# Patient Record
Sex: Male | Born: 1940 | Race: White | Hispanic: No | Marital: Married | Smoking: Never smoker
Health system: Southern US, Community
[De-identification: ages and names within clinical notes are randomized; demographics above are authoritative.]

## PROBLEM LIST (undated history)

## (undated) DIAGNOSIS — H269 Unspecified cataract: Secondary | ICD-10-CM

## (undated) DIAGNOSIS — C61 Malignant neoplasm of prostate: Secondary | ICD-10-CM

## (undated) DIAGNOSIS — C801 Malignant (primary) neoplasm, unspecified: Secondary | ICD-10-CM

## (undated) DIAGNOSIS — I10 Essential (primary) hypertension: Secondary | ICD-10-CM

## (undated) HISTORY — DX: Unspecified cataract: H26.9

## (undated) HISTORY — PX: LIVER BIOPSY: SHX301

## (undated) HISTORY — PX: OTHER SURGICAL HISTORY: SHX169

## (undated) HISTORY — PX: PROSTATE BIOPSY: SHX241

## (undated) HISTORY — DX: Essential (primary) hypertension: I10

---
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
## 2016-09-23 HISTORY — PX: PROSTATECTOMY: SHX69

## 2018-01-28 DIAGNOSIS — H269 Unspecified cataract: Secondary | ICD-10-CM | POA: Insufficient documentation

## 2018-01-28 DIAGNOSIS — Z7189 Other specified counseling: Secondary | ICD-10-CM | POA: Insufficient documentation

## 2018-01-28 DIAGNOSIS — C61 Malignant neoplasm of prostate: Secondary | ICD-10-CM | POA: Insufficient documentation

## 2018-01-28 DIAGNOSIS — Z1329 Encounter for screening for other suspected endocrine disorder: Secondary | ICD-10-CM | POA: Insufficient documentation

## 2018-01-28 DIAGNOSIS — Z1211 Encounter for screening for malignant neoplasm of colon: Secondary | ICD-10-CM | POA: Insufficient documentation

## 2018-01-28 DIAGNOSIS — Z0000 Encounter for general adult medical examination without abnormal findings: Secondary | ICD-10-CM | POA: Insufficient documentation

## 2018-01-28 DIAGNOSIS — E782 Mixed hyperlipidemia: Secondary | ICD-10-CM | POA: Insufficient documentation

## 2018-01-28 DIAGNOSIS — I341 Nonrheumatic mitral (valve) prolapse: Secondary | ICD-10-CM | POA: Insufficient documentation

## 2018-01-28 DIAGNOSIS — I10 Essential (primary) hypertension: Secondary | ICD-10-CM | POA: Insufficient documentation

## 2018-01-28 DIAGNOSIS — Z130 Encounter for screening for diseases of the blood and blood-forming organs and certain disorders involving the immune mechanism: Secondary | ICD-10-CM | POA: Insufficient documentation

## 2018-01-28 DIAGNOSIS — Z125 Encounter for screening for malignant neoplasm of prostate: Secondary | ICD-10-CM | POA: Insufficient documentation

## 2018-03-04 DIAGNOSIS — R739 Hyperglycemia, unspecified: Secondary | ICD-10-CM | POA: Insufficient documentation

## 2018-03-04 DIAGNOSIS — N183 Chronic kidney disease, stage 3 unspecified: Secondary | ICD-10-CM | POA: Insufficient documentation

## 2018-03-04 DIAGNOSIS — R0989 Other specified symptoms and signs involving the circulatory and respiratory systems: Secondary | ICD-10-CM | POA: Insufficient documentation

## 2019-12-16 ENCOUNTER — Encounter: Payer: Self-pay | Admitting: Family Medicine

## 2019-12-16 ENCOUNTER — Ambulatory Visit (INDEPENDENT_AMBULATORY_CARE_PROVIDER_SITE_OTHER): Payer: Medicare Other | Admitting: Family Medicine

## 2019-12-16 ENCOUNTER — Other Ambulatory Visit: Payer: Self-pay

## 2019-12-16 VITALS — BP 130/82 | HR 82 | Temp 98.2°F | Ht 68.5 in | Wt 193.2 lb

## 2019-12-16 DIAGNOSIS — C61 Malignant neoplasm of prostate: Secondary | ICD-10-CM | POA: Insufficient documentation

## 2019-12-16 DIAGNOSIS — Z1211 Encounter for screening for malignant neoplasm of colon: Secondary | ICD-10-CM

## 2019-12-16 DIAGNOSIS — I10 Essential (primary) hypertension: Secondary | ICD-10-CM

## 2019-12-16 DIAGNOSIS — H269 Unspecified cataract: Secondary | ICD-10-CM | POA: Diagnosis not present

## 2019-12-16 NOTE — Assessment & Plan Note (Signed)
At goal.  Continue amlodipine 5 mg daily. 

## 2019-12-16 NOTE — Assessment & Plan Note (Signed)
Stable.  Will place referral to ophthalmology.

## 2019-12-16 NOTE — Patient Instructions (Signed)
It was very nice to see you today!  I will place a referral for you to see the eye doctor and urologist.  We will place an order for you to get colon cancer screening.  Please let me know when you need to refill your amlodipine.  Come back in 1 year for your next checkup with blood work, or sooner if needed.  Take care, Dr Jerline Pain  Please try these tips to maintain a healthy lifestyle:   Eat at least 3 REAL meals and 1-2 snacks per day.  Aim for no more than 5 hours between eating.  If you eat breakfast, please do so within one hour of getting up.    Each meal should contain half fruits/vegetables, one quarter protein, and one quarter carbs (no bigger than a computer mouse)   Cut down on sweet beverages. This includes juice, soda, and sweet tea.     Drink at least 1 glass of water with each meal and aim for at least 8 glasses per day   Exercise at least 150 minutes every week.

## 2019-12-16 NOTE — Assessment & Plan Note (Signed)
Stable.  Will place referral to urology.

## 2019-12-16 NOTE — Progress Notes (Signed)
Neil Perry is a 79 y.o. male who presents today for an office visit.  Assessment/Plan:  Chronic Problems Addressed Today: Essential hypertension At goal.  Continue amlodipine 5 mg daily.  Cataract of both eyes Stable.  Will place referral to ophthalmology.  Prostate cancer ([place]) Stable.  Will place referral to urology.  Preventative Healthcare Will place order for cologuard.     Subjective:  HPI:  His stable, chronic medical conditions are outlined below:   # Essential Hypertension - On amlodipine 5mg  daily and tolerating well  # Prostate Cancer s/p prostatectomy 2018 - Follows with urology  # Cataracts - Follows with ophthamology  PMH:  The following were reviewed and entered/updated in epic: Past Medical History:  Diagnosis Date  . Cancer ([place])   . Cataract   . Hypertension    Patient Active Problem List   Diagnosis Date Noted  . Prostate cancer ([place]) 12/16/2019  . Cataract of both eyes 12/16/2019  . Essential hypertension 12/16/2019   Past Surgical History:  Procedure Laterality Date  . cataract surgery Right   . PROSTATECTOMY  2018  . PROSTATECTOMY      Family History  Problem Relation Age of Onset  . Prostate cancer Father   . Prostate cancer Brother     Medications- reviewed and updated Current Outpatient Medications  Medication Sig Dispense Refill  . amLODipine (NORVASC) 5 MG tablet Take 5 mg by mouth daily.     No current facility-administered medications for this visit.    Allergies-reviewed and updated No Known Allergies  Social History   Socioeconomic History  . Marital status: Married    Spouse name: Not on file  . Number of children: Not on file  . Years of education: Not on file  . Highest education level: Not on file  Occupational History  . Not on file  Tobacco Use  . Smoking status: Never Smoker  . Smokeless tobacco: Never Used  Substance and Sexual Activity  . Alcohol use: Yes    Alcohol/week: 2.0 standard  drinks    Types: 2 Glasses of wine per week  . Drug use: Never  . Sexual activity: Not on file  Other Topics Concern  . Not on file  Social History Narrative  . Not on file   Social Determinants of Health   Financial Resource Strain:   . Difficulty of Paying Living Expenses:   Food Insecurity:   . Worried About Charity fundraiser in the Last Year:   . Arboriculturist in the Last Year:   Transportation Needs:   . Film/video editor (Medical):   Marland Kitchen Lack of Transportation (Non-Medical):   Physical Activity:   . Days of Exercise per Week:   . Minutes of Exercise per Session:   Stress:   . Feeling of Stress :   Social Connections:   . Frequency of Communication with Friends and Family:   . Frequency of Social Gatherings with Friends and Family:   . Attends Religious Services:   . Active Member of Clubs or Organizations:   . Attends Archivist Meetings:   Marland Kitchen Marital Status:          Objective:  Physical Exam: BP 130/82   Pulse 82   Temp 98.2 F (36.8 C)   Ht 5' 8.5" (1.74 m)   Wt 193 lb 4 oz (87.7 kg)   SpO2 99%   BMI 28.96 kg/m   Gen: No acute distress, resting comfortably CV: Regular rate  and rhythm with no murmurs appreciated Pulm: Normal work of breathing, clear to auscultation bilaterally with no crackles, wheezes, or rhonchi Neuro: Grossly normal, moves all extremities Psych: Normal affect and thought content      Early Ord M. Jerline Pain, MD 12/16/2019 2:16 PM

## 2019-12-17 ENCOUNTER — Ambulatory Visit: Payer: Medicare Other | Attending: Internal Medicine

## 2019-12-17 DIAGNOSIS — Z23 Encounter for immunization: Secondary | ICD-10-CM

## 2019-12-17 NOTE — Progress Notes (Signed)
   Covid-19 Vaccination Clinic  Name:  Neil Perry    MRN: [id number] DOB: 25-Nov-1940  12/17/2019  [name] was observed post Covid-19 immunization for 15 minutes without incident. He was provided with Vaccine Information Sheet and instruction to access the V-Safe system.   [name] was instructed to call 911 with any severe reactions post vaccine: Marland Kitchen Difficulty breathing  . Swelling of face and throat  . A fast heartbeat  . A bad rash all over body  . Dizziness and weakness   Immunizations Administered    Name Date Dose VIS Date Route   Pfizer COVID-19 Vaccine 12/17/2019  1:04 PM 0.3 mL 09/03/2019 Intramuscular   Manufacturer: [place]   Lot: [id number]   [place]: [id number]

## 2020-01-11 ENCOUNTER — Ambulatory Visit: Payer: Medicare Other | Attending: Internal Medicine

## 2020-01-11 DIAGNOSIS — Z23 Encounter for immunization: Secondary | ICD-10-CM

## 2020-01-11 NOTE — Progress Notes (Signed)
   Covid-19 Vaccination Clinic  Name:  Neil Perry    MRN: [id number] DOB: May 07, 1941  01/11/2020  [name] was observed post Covid-19 immunization for 15 minutes without incident. He was provided with Vaccine Information Sheet and instruction to access the V-Safe system.   [name] was instructed to call 911 with any severe reactions post vaccine: Marland Kitchen Difficulty breathing  . Swelling of face and throat  . A fast heartbeat  . A bad rash all over body  . Dizziness and weakness   Immunizations Administered    Name Date Dose VIS Date Route   Pfizer COVID-19 Vaccine 01/11/2020  1:04 PM 0.3 mL 11/17/2018 Intramuscular   Manufacturer: [place]   Lot: [id number]   [place]: [id number]

## 2020-01-18 ENCOUNTER — Encounter (INDEPENDENT_AMBULATORY_CARE_PROVIDER_SITE_OTHER): Payer: Self-pay

## 2020-01-18 ENCOUNTER — Ambulatory Visit (INDEPENDENT_AMBULATORY_CARE_PROVIDER_SITE_OTHER): Payer: Medicare Other

## 2020-01-18 ENCOUNTER — Other Ambulatory Visit: Payer: Self-pay

## 2020-01-18 DIAGNOSIS — Z0000 Encounter for general adult medical examination without abnormal findings: Secondary | ICD-10-CM | POA: Diagnosis not present

## 2020-01-18 NOTE — Progress Notes (Signed)
This visit is being conducted via phone call due to the COVID-19 pandemic. This patient has given me verbal consent via phone to conduct this visit, patient states they are participating from their home address. Some vital signs may be absent or patient reported.   Patient identification: identified by name, DOB, and current address.  Location provider: [facility], Office Persons participating in the virtual visit: Neil Perry, patient, and Dr. Dimas Chyle     Subjective:   Neil Perry is a 79 y.o. male who presents for an Initial Medicare Annual Wellness Visit.  Review of Systems   Cardiac Risk Factors include: advanced age (>82men, >27 women);male gender;hypertension   Objective:    There were no vitals filed for this visit. There is no height or weight on file to calculate BMI.  Advanced Directives 01/18/2020  Does Patient Have a Medical Advance Directive? Yes  Type of Advance Directive Living will;Healthcare Power of Attorney  Does patient want to make changes to medical advance directive? No - Patient declined  Copy of [place] in Chart? No - copy requested    Current Medications (verified) Outpatient Encounter Medications as of 01/18/2020  Medication Sig  . amLODipine (NORVASC) 5 MG tablet Take 5 mg by mouth daily.   No facility-administered encounter medications on file as of 01/18/2020.    Allergies (verified) Patient has no known allergies.   History: Past Medical History:  Diagnosis Date  . Cancer ([place])   . Cataract   . Hypertension    Past Surgical History:  Procedure Laterality Date  . cataract surgery Right   . PROSTATECTOMY  2018  . PROSTATECTOMY     Family History  Problem Relation Age of Onset  . Prostate cancer Father   . Prostate cancer Brother    Social History   Socioeconomic History  . Marital status: Married    Spouse name: Not on file  . Number of children: Not on file  . Years of education: Not on  file  . Highest education level: Not on file  Occupational History  . Not on file  Tobacco Use  . Smoking status: Never Smoker  . Smokeless tobacco: Never Used  Substance and Sexual Activity  . Alcohol use: Yes    Alcohol/week: 2.0 standard drinks    Types: 2 Glasses of wine per week  . Drug use: Never  . Sexual activity: Not on file  Other Topics Concern  . Not on file  Social History Narrative  . Not on file   Social Determinants of Health   Financial Resource Strain:   . Difficulty of Paying Living Expenses:   Food Insecurity:   . Worried About Charity fundraiser in the Last Year:   . Arboriculturist in the Last Year:   Transportation Needs:   . Film/video editor (Medical):   Marland Kitchen Lack of Transportation (Non-Medical):   Physical Activity:   . Days of Exercise per Week:   . Minutes of Exercise per Session:   Stress:   . Feeling of Stress :   Social Connections:   . Frequency of Communication with Friends and Family:   . Frequency of Social Gatherings with Friends and Family:   . Attends Religious Services:   . Active Member of Clubs or Organizations:   . Attends Archivist Meetings:   Marland Kitchen Marital Status:    Tobacco Counseling Counseling given: Not Answered   Clinical Intake:  Pre-visit preparation completed:  Yes  Pain : No/denies pain  Diabetes: No  How often do you need to have someone help you when you read instructions, pamphlets, or other written materials from your doctor or pharmacy?: 1 - Never  Interpreter Needed?: No  Information entered by :: Neil Perry  Activities of Daily Living In your present state of health, do you have any difficulty performing the following activities: 01/18/2020  Hearing? N  Vision? N  Difficulty concentrating or making decisions? N  Walking or climbing stairs? N  Dressing or bathing? N  Doing errands, shopping? N  Preparing Food and eating ? N  Using the Toilet? N  In the past six months, have  you accidently leaked urine? N  Do you have problems with loss of bowel control? N  Managing your Medications? N  Managing your Finances? N  Housekeeping or managing your Housekeeping? N     Immunizations and Health Maintenance Immunization History  Administered Date(s) Administered  . PFIZER SARS-COV-2 Vaccination 12/17/2019, 01/11/2020   There are no preventive care reminders to display for this patient.  Patient Care Team: Vivi Barrack, MD as PCP - General (Family Medicine)  Indicate any recent Medical Services you may have received from other than [facility] providers in the past year (date may be approximate).    Assessment:   This is a routine wellness examination for Neil Perry.  Hearing/Vision screen No exam data present  Dietary issues and exercise activities discussed: Current Exercise Habits: The patient does not participate in regular exercise at present  Goals   None    Depression Screen PHQ 2/9 Scores 01/18/2020 12/16/2019  PHQ - 2 Score 0 0    Fall Risk Fall Risk  01/18/2020 12/16/2019  Falls in the past year? 0 0  Number falls in past yr: 0 -  Injury with Fall? 0 -  Follow up Education provided;Falls evaluation completed;Falls prevention discussed -    Is the patient's home free of loose throw rugs in walkways, pet beds, electrical cords, etc?   yes      Grab bars in the bathroom? yes      Handrails on the stairs?   yes      Adequate lighting?   yes   Cognitive Function: no cognitive concerns at this time         Screening Tests Health Maintenance  Topic Date Due  . TETANUS/TDAP  12/15/2020 (Originally 12/07/1959)  . PNA vac Low Risk Adult (1 of 2 - PCV13) 12/15/2020 (Originally 12/06/2004)  . INFLUENZA VACCINE  04/23/2020  . COVID-19 Vaccine  Completed    Qualifies for Shingles Vaccine? Discussed and patient will check with pharmacy for coverage.  Patient education handout provided   Cancer Screenings: Lung: Low Dose CT Chest recommended if Age  49-80 years, 30 pack-year currently smoking OR have quit w/in 15years. Patient does not qualify. Colorectal: requesting past records; no longer indicated      Plan:  I have personally reviewed and addressed the Medicare Annual Wellness questionnaire and have noted the following in the patient's chart:  A. Medical and social history B. Use of alcohol, tobacco or illicit drugs  C. Current medications and supplements D. Functional ability and status E.  Nutritional status F.  Physical activity G. Advance directives H. List of other physicians I.  Hospitalizations, surgeries, and ER visits in previous 12 months J.  Accident such as hearing and vision if needed, cognitive and depression L. Referrals, records requested, and appointments- records  from [place]   In addition, I have reviewed and discussed with patient certain preventive protocols, quality metrics, and best practice recommendations. A written personalized care plan for preventive services as well as general preventive health recommendations were provided to patient.   Signed,  Neil George, Perry  Nurse Health Advisor   Nurse Notes: Will follow up with patient in regards to recent referrals and provide contact information for these.

## 2020-01-18 NOTE — Patient Instructions (Addendum)
Neil Perry , Thank you for taking time to come for your Medicare Wellness Visit. I appreciate your ongoing commitment to your health goals. Please review the following plan we discussed and let me know if I can assist you in the future.   Screening recommendations/referrals: Colorectal Screening: please completed the Cologuard   Vision and Dental Exams: Recommended annual ophthalmology exams for early detection of glaucoma and other disorders of the eye Recommended annual dental exams for proper oral hygiene  Vaccinations: we will update once records received  Influenza vaccine: recommended each fall  Pneumococcal vaccine: recommended  Tdap vaccine: recommended every 10 years; Please call your insurance company to determine your out of pocket expense. You also receive this vaccine at your local pharmacy or Health Dept. Shingles vaccine:  You may receive this vaccine at your local pharmacy. (see handout)  Covid vaccine: completed   Advanced directives: Please bring a copy of your POA (Power of Attorney) and/or Living Will to your next appointment.  Goals: Recommend to drink at least 6-8 8oz glasses of water per day and consume a balanced diet rich in fresh fruits and vegetables.   Next appointment: Please schedule your Annual Wellness Visit with your Nurse Health Advisor in one year.  Preventive Care 64 Years and Older, Male Preventive care refers to lifestyle choices and visits with your health care provider that can promote health and wellness. What does preventive care include?  A yearly physical exam. This is also called an annual well check.  Dental exams once or twice a year.  Routine eye exams. Ask your health care provider how often you should have your eyes checked.  Personal lifestyle choices, including:  Daily care of your teeth and gums.  Regular physical activity.  Eating a healthy diet.  Avoiding tobacco and drug use.  Limiting alcohol use.  Practicing safe  sex.  Taking low doses of aspirin every day if recommended by your health care provider..  Taking vitamin and mineral supplements as recommended by your health care provider. What happens during an annual well check? The services and screenings done by your health care provider during your annual well check will depend on your age, overall health, lifestyle risk factors, and family history of disease. Counseling  Your health care provider may ask you questions about your:  Alcohol use.  Tobacco use.  Drug use.  Emotional well-being.  Home and relationship well-being.  Sexual activity.  Eating habits.  History of falls.  Memory and ability to understand (cognition).  Work and work Statistician. Screening  You may have the following tests or measurements:  Height, weight, and BMI.  Blood pressure.  Lipid and cholesterol levels. These may be checked every 5 years, or more frequently if you are over 45 years old.  Skin check.  Lung cancer screening. You may have this screening every year starting at age 6 if you have a 30-pack-year history of smoking and currently smoke or have quit within the past 15 years.  Fecal occult blood test (FOBT) of the stool. You may have this test every year starting at age 72.  Flexible sigmoidoscopy or colonoscopy. You may have a sigmoidoscopy every 5 years or a colonoscopy every 10 years starting at age 49.  Prostate cancer screening. Recommendations will vary depending on your family history and other risks.  Hepatitis C blood test.  Hepatitis B blood test.  Sexually transmitted disease (STD) testing.  Diabetes screening. This is done by checking your blood sugar (glucose) after  you have not eaten for a while (fasting). You may have this done every 1-3 years.  Abdominal aortic aneurysm (AAA) screening. You may need this if you are a current or former smoker.  Osteoporosis. You may be screened starting at age 23 if you are at high  risk. Talk with your health care provider about your test results, treatment options, and if necessary, the need for more tests. Vaccines  Your health care provider may recommend certain vaccines, such as:  Influenza vaccine. This is recommended every year.  Tetanus, diphtheria, and acellular pertussis (Tdap, Td) vaccine. You may need a Td booster every 10 years.  Zoster vaccine. You may need this after age 65.  Pneumococcal 13-valent conjugate (PCV13) vaccine. One dose is recommended after age 55.  Pneumococcal polysaccharide (PPSV23) vaccine. One dose is recommended after age 42. Talk to your health care provider about which screenings and vaccines you need and how often you need them. This information is not intended to replace advice given to you by your health care provider. Make sure you discuss any questions you have with your health care provider. Document Released: 10/06/2015 Document Revised: 05/29/2016 Document Reviewed: 07/11/2015 Elsevier Interactive Patient Education  2017 [place] Prevention in the Home Falls can cause injuries. They can happen to people of all ages. There are many things you can do to make your home safe and to help prevent falls. What can I do on the outside of my home?  Regularly fix the edges of walkways and driveways and fix any cracks.  Remove anything that might make you trip as you walk through a door, such as a raised step or threshold.  Trim any bushes or trees on the path to your home.  Use bright outdoor lighting.  Clear any walking paths of anything that might make someone trip, such as rocks or tools.  Regularly check to see if handrails are loose or broken. Make sure that both sides of any steps have handrails.  Any raised decks and porches should have guardrails on the edges.  Have any leaves, snow, or ice cleared regularly.  Use sand or salt on walking paths during winter.  Clean up any spills in your garage right  away. This includes oil or grease spills. What can I do in the bathroom?  Use night lights.  Install grab bars by the toilet and in the tub and shower. Do not use towel bars as grab bars.  Use non-skid mats or decals in the tub or shower.  If you need to sit down in the shower, use a plastic, non-slip stool.  Keep the floor dry. Clean up any water that spills on the floor as soon as it happens.  Remove soap buildup in the tub or shower regularly.  Attach bath mats securely with double-sided non-slip rug tape.  Do not have throw rugs and other things on the floor that can make you trip. What can I do in the bedroom?  Use night lights.  Make sure that you have a light by your bed that is easy to reach.  Do not use any sheets or blankets that are too big for your bed. They should not hang down onto the floor.  Have a firm chair that has side arms. You can use this for support while you get dressed.  Do not have throw rugs and other things on the floor that can make you trip. What can I do in the kitchen?  Clean  up any spills right away.  Avoid walking on wet floors.  Keep items that you use a lot in easy-to-reach places.  If you need to reach something above you, use a strong step stool that has a grab bar.  Keep electrical cords out of the way.  Do not use floor polish or wax that makes floors slippery. If you must use wax, use non-skid floor wax.  Do not have throw rugs and other things on the floor that can make you trip. What can I do with my stairs?  Do not leave any items on the stairs.  Make sure that there are handrails on both sides of the stairs and use them. Fix handrails that are broken or loose. Make sure that handrails are as long as the stairways.  Check any carpeting to make sure that it is firmly attached to the stairs. Fix any carpet that is loose or worn.  Avoid having throw rugs at the top or bottom of the stairs. If you do have throw rugs, attach  them to the floor with carpet tape.  Make sure that you have a light switch at the top of the stairs and the bottom of the stairs. If you do not have them, ask someone to add them for you. What else can I do to help prevent falls?  Wear shoes that:  Do not have high heels.  Have rubber bottoms.  Are comfortable and fit you well.  Are closed at the toe. Do not wear sandals.  If you use a stepladder:  Make sure that it is fully opened. Do not climb a closed stepladder.  Make sure that both sides of the stepladder are locked into place.  Ask someone to hold it for you, if possible.  Clearly mark and make sure that you can see:  Any grab bars or handrails.  First and last steps.  Where the edge of each step is.  Use tools that help you move around (mobility aids) if they are needed. These include:  Canes.  Walkers.  Scooters.  Crutches.  Turn on the lights when you go into a dark area. Replace any light bulbs as soon as they burn out.  Set up your furniture so you have a clear path. Avoid moving your furniture around.  If any of your floors are uneven, fix them.  If there are any pets around you, be aware of where they are.  Review your medicines with your doctor. Some medicines can make you feel dizzy. This can increase your chance of falling. Ask your doctor what other things that you can do to help prevent falls. This information is not intended to replace advice given to you by your health care provider. Make sure you discuss any questions you have with your health care provider. Document Released: 07/06/2009 Document Revised: 02/15/2016 Document Reviewed: 10/14/2014 Elsevier Interactive Patient Education  2017 [employer].

## 2020-02-22 ENCOUNTER — Other Ambulatory Visit: Payer: Self-pay | Admitting: Family Medicine

## 2020-06-05 ENCOUNTER — Other Ambulatory Visit: Payer: Self-pay | Admitting: Family Medicine

## 2020-06-27 ENCOUNTER — Other Ambulatory Visit (HOSPITAL_COMMUNITY): Payer: Self-pay | Admitting: Urology

## 2020-06-27 ENCOUNTER — Other Ambulatory Visit: Payer: Self-pay | Admitting: Urology

## 2020-06-27 DIAGNOSIS — R972 Elevated prostate specific antigen [PSA]: Secondary | ICD-10-CM

## 2020-06-27 DIAGNOSIS — C61 Malignant neoplasm of prostate: Secondary | ICD-10-CM

## 2020-07-07 ENCOUNTER — Ambulatory Visit (HOSPITAL_COMMUNITY)
Admission: RE | Admit: 2020-07-07 | Discharge: 2020-07-07 | Disposition: A | Discharge status: Home / Self Care | Payer: Medicare Other | Source: Ambulatory Visit | Attending: Urology | Admitting: Urology

## 2020-07-07 ENCOUNTER — Other Ambulatory Visit: Payer: Self-pay

## 2020-07-07 ENCOUNTER — Encounter (HOSPITAL_COMMUNITY)
Admission: RE | Admit: 2020-07-07 | Discharge: 2020-07-07 | Disposition: A | Discharge status: Home / Self Care | Payer: Medicare Other | Source: Ambulatory Visit | Attending: Urology | Admitting: Urology

## 2020-07-07 DIAGNOSIS — R972 Elevated prostate specific antigen [PSA]: Secondary | ICD-10-CM | POA: Insufficient documentation

## 2020-07-07 DIAGNOSIS — C61 Malignant neoplasm of prostate: Secondary | ICD-10-CM

## 2020-07-07 MED ORDER — TECHNETIUM TC 99M MEDRONATE IV KIT
21.3000 | PACK | Freq: Once | INTRAVENOUS | Status: AC | PRN
  Administered 2020-07-07: 21.3 via INTRAVENOUS

## 2020-07-17 ENCOUNTER — Other Ambulatory Visit (HOSPITAL_COMMUNITY): Payer: Self-pay | Admitting: Urology

## 2020-07-17 ENCOUNTER — Other Ambulatory Visit: Payer: Self-pay | Admitting: Urology

## 2020-07-17 DIAGNOSIS — D4102 Neoplasm of uncertain behavior of left kidney: Secondary | ICD-10-CM

## 2020-07-18 ENCOUNTER — Ambulatory Visit (HOSPITAL_COMMUNITY): Payer: Medicare Other

## 2020-07-19 ENCOUNTER — Ambulatory Visit (HOSPITAL_COMMUNITY): Payer: Medicare Other

## 2020-07-25 ENCOUNTER — Ambulatory Visit (HOSPITAL_COMMUNITY)
Admission: RE | Admit: 2020-07-25 | Discharge: 2020-07-25 | Disposition: A | Discharge status: Home / Self Care | Payer: Medicare Other | Source: Ambulatory Visit | Attending: Urology | Admitting: Urology

## 2020-07-25 ENCOUNTER — Other Ambulatory Visit: Payer: Self-pay

## 2020-07-25 DIAGNOSIS — D4102 Neoplasm of uncertain behavior of left kidney: Secondary | ICD-10-CM | POA: Diagnosis present

## 2020-07-25 MED ORDER — GADOBUTROL 1 MMOL/ML IV SOLN
8.5000 mL | Freq: Once | INTRAVENOUS | Status: AC | PRN
  Administered 2020-07-25: 8.5 mL via INTRAVENOUS

## 2020-08-07 ENCOUNTER — Encounter: Payer: Self-pay | Admitting: Radiation Oncology

## 2020-08-07 ENCOUNTER — Telehealth: Payer: Self-pay | Admitting: Radiation Oncology

## 2020-08-07 NOTE — Progress Notes (Signed)
GU Location of Tumor / Histology: prostatic adenocarcinoma  If Prostate Cancer, Gleason Score is (4 + 4) and PSA is (10.6). Prostate volume: 54 mL.  Neil Perry was diagnosed with Gleason 3+3 prostate cancer following a simple prostatectomy for enlarged prostate in January 2018.   Biopsies of prostate (if applicable) revealed:    Past/Anticipated interventions by urology, if any: simple prostatectomy, prostate biopsy, prostate biopsy, CT abd/pelvis (positive for adenopathy), bone scan (negative), ADT (scheduled to receive in 2 weeks), referral for consideration of radiotherapy.   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   Past/Anticipated interventions by medical oncology, if any: no  Weight changes, if any: no  Bowel/Bladder complaints, if any: IPSS 2 with nocturia. Severe ED with a SHIM of 3. Denies dysuria, hematuria, urinary leakage or incontinence. Denies any bowel complaints.  Nausea/Vomiting, if any: no  Pain issues, if any:  denies  SAFETY ISSUES:  Prior radiation? denies  Pacemaker/ICD? denies  Possible current pregnancy? no, male patient  Is the patient on methotrexate? denies  Current Complaints / other details:  79 year old male. Retired Forensic psychologist. Moved here from [place]. History of prostate ca in paternal GF, father, and brother.

## 2020-08-07 NOTE — Telephone Encounter (Signed)
Pt called and LVM for Joaquim Lai, RN about how to do a MyChart video for tomorrow's consult. I called patient and let him know the following: MyChart Instructions  Patient Instructions Please be sure to join your visit at least 20 minutes before your scheduled time.  Click here for a flyer with additional tipsto help you have a successful video visit.   1. Test your microphone and camera click here   2. When it is time for your visit, click  the green Begin Video Visit button above.   3. Be sure to select "allow" for your device to access the microphone and camera for your visit.   4. You will then be connected to the virtual waiting room. Your provider will be with you shortly.

## 2020-08-08 ENCOUNTER — Encounter: Payer: Self-pay | Admitting: Radiation Oncology

## 2020-08-08 ENCOUNTER — Other Ambulatory Visit: Payer: Self-pay

## 2020-08-08 ENCOUNTER — Ambulatory Visit
Admission: RE | Admit: 2020-08-08 | Discharge: 2020-08-08 | Disposition: A | Discharge status: Home / Self Care | Payer: Medicare Other | Source: Ambulatory Visit | Attending: Radiation Oncology | Admitting: Radiation Oncology

## 2020-08-08 VITALS — Ht 69.0 in | Wt 193.0 lb

## 2020-08-08 DIAGNOSIS — C61 Malignant neoplasm of prostate: Secondary | ICD-10-CM

## 2020-08-08 HISTORY — DX: Malignant neoplasm of prostate: C61

## 2020-08-08 NOTE — Progress Notes (Addendum)
Radiation Oncology         [phone] ________________________________  Initial Outpatient Consultation - Conducted via MyChart due to current COVID-19 concerns for limiting patient exposure  Name: Neil Perry MRN: [id number]  Date: 08/08/2020  DOB: [birth year]  VO:[id number], Algis Greenhouse, MD  Irine Seal, MD   REFERRING PHYSICIAN: Irine Seal, MD  DIAGNOSIS: 80 y.o. 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    ICD-10-CM   1. Malignant tumor of prostate [facility])  [place]    Oncology History  Malignant tumor of prostate ([place])  01/28/2018 Initial Diagnosis   Malignant tumor of prostate ([place])   06/19/2020 Cancer Staging   Staging form: Prostate, AJCC 8th Edition - Clinical stage from 06/19/2020: Stage IVB (cT2c, cN1, cM1a, PSA: 10.6, Grade Group: 4) - Signed by Tyler Pita, MD on 08/10/2020    HISTORY OF PRESENT ILLNESS: Neil Perry is a 79 y.o. male with a diagnosis of prostate cancer. He has a history of an elevated PSA dating back to at least 2017 when it was 6. While living in [place], he underwent simple prostatectomy in 09/2016 for management of BPH with BOO, and pathology revealed Gleason 3+3 prostate cancer in 10 to 12% of the specimen.  He elected to proceed with active surveillance and his PSA initially decreased to 3 postoperatively but increased back up to 8 by 12/2017. Surveillance biopsy performed in 01/2018 showed stable disease with a small focus of Gleason 3+3 disease in 1/6 cores so he continued in active surveillance.   More recently, he moved to [facility] and saw Dr. Jeffie Pollock on 04/20/2020, digital rectal examination performed at that time showed hardness to both lobes of the prostate with right lobe larger than the left.  A repeat PSA performed that day was 10.6. The patient proceeded to repeat surveillance transrectal ultrasound with 12 biopsies of the prostate on 06/19/2020.  The prostate volume measured 54 cc.  Out of 12  core biopsies, 8 were positive, including all right-sided cores.  The maximum Gleason score was 4+4, and this was seen in the right apex lateral, right mid lateral, right base lateral (with PNI), right apex, left base lateral, and left mid lateral. Additionally, Gleason 4+3 was seen in the right mid and Gleason 3+3 in the right base.  He underwent disease staging imaging on 07/07/2020 with a bone scan that was negative for findings specific for metastatic disease and a CT A/P that showed right iliac chain and retrocaval lymphadenopathy, consistent with metastatic disease as well as a 1.3 cm heterogeneous subcapsular lesion in the lower pole of the right kidney, suspicious for a solid renal neoplasm.  Below is a screenshot of his highest involved lymph node on CT:   The right kidney lesion was further evaluated with an abdominal MRI on 07/25/2020 which demonstrated an exophytic, 1.6 cm rim-enhancing lesion from right kidney lower pole, suspicious for neoplasm such as renal cell carcinoma.  The lower retrocaval and pelvic adenopathy was noted as well as a pancreas divisum with several small fluid-signal intensity lesions in the pancreas.  He is scheduled to begin ADT on 08/21/2020. He is also seeking out a second opinion with Dr. Thayer Jew at [facility], but he has not yet heard back from them regarding an appointment date/time.  The patient reviewed the biopsy results with his urologist and he has kindly been referred today for discussion of potential radiation treatment options.   PREVIOUS RADIATION THERAPY: No  PAST MEDICAL HISTORY:  Past Medical History:  Diagnosis Date  . Cataract   . Hypertension   . Prostate cancer ([place])       PAST SURGICAL HISTORY: Past Surgical History:  Procedure Laterality Date  . cataract surgery Right   . PROSTATE BIOPSY    . PROSTATECTOMY  2018    FAMILY HISTORY:  Family History  Problem Relation Age of Onset  . Prostate cancer Father   . Prostate cancer  Brother   . Prostate cancer Paternal Grandfather   . Breast cancer Neg Hx   . Colon cancer Neg Hx   . Pancreatic cancer Neg Hx     SOCIAL HISTORY:  Social History   Socioeconomic History  . Marital status: Married    Spouse name: Not on file  . Number of children: Not on file  . Years of education: Not on file  . Highest education level: Not on file  Occupational History  . Occupation: attorney    Comment: retired  Tobacco Use  . Smoking status: Never Smoker  . Smokeless tobacco: Never Used  Vaping Use  . Vaping Use: Never used  Substance and Sexual Activity  . Alcohol use: Yes    Alcohol/week: 2.0 standard drinks    Types: 2 Glasses of wine per week  . Drug use: Never  . Sexual activity: Not Currently  Other Topics Concern  . Not on file  Social History Narrative  . Not on file   Social Determinants of Health   Financial Resource Strain:   . Difficulty of Paying Living Expenses: Not on file  Food Insecurity:   . Worried About Charity fundraiser in the Last Year: Not on file  . Ran Out of Food in the Last Year: Not on file  Transportation Needs:   . Lack of Transportation (Medical): Not on file  . Lack of Transportation (Non-Medical): Not on file  Physical Activity:   . Days of Exercise per Week: Not on file  . Minutes of Exercise per Session: Not on file  Stress:   . Feeling of Stress : Not on file  Social Connections:   . Frequency of Communication with Friends and Family: Not on file  . Frequency of Social Gatherings with Friends and Family: Not on file  . Attends Religious Services: Not on file  . Active Member of Clubs or Organizations: Not on file  . Attends Archivist Meetings: Not on file  . Marital Status: Not on file  Intimate Partner Violence:   . Fear of Current or Ex-Partner: Not on file  . Emotionally Abused: Not on file  . Physically Abused: Not on file  . Sexually Abused: Not on file    ALLERGIES: Patient has no known  allergies.  MEDICATIONS:  Current Outpatient Medications  Medication Sig Dispense Refill  . amLODipine (NORVASC) 5 MG tablet TAKE 1 TABLET BY MOUTH EVERY DAY 90 tablet 1  . Besifloxacin HCl (BESIVANCE) 0.6 % SUSP Besivance 0.6 % eye drops,suspension    . betamethasone dipropionate 0.05 % lotion betamethasone dipropionate 0.05 % lotion    . Difluprednate (DUREZOL) 0.05 % EMUL Durezol 0.05 % eye drops    . nepafenac (ILEVRO) 0.3 % ophthalmic suspension Ilevro 0.3 % eye drops,suspension     No current facility-administered medications for this encounter.    REVIEW OF SYSTEMS:  On review of systems, the patient reports that he is doing well overall. He denies any chest pain, shortness of breath, cough, fevers,  chills, night sweats, unintended weight changes. He denies any bowel disturbances, and denies abdominal pain, nausea or vomiting. He denies any new musculoskeletal or joint aches or pains. His IPSS was 2, indicating mild urinary symptoms. His only complaint is nocturia. His SHIM was 3, indicating he has severe erectile dysfunction. A complete review of systems is obtained and is otherwise negative.    PHYSICAL EXAM:  Wt Readings from Last 3 Encounters:  08/08/20 193 lb (87.5 kg)  12/16/19 193 lb 4 oz (87.7 kg)   Temp Readings from Last 3 Encounters:  12/16/19 98.2 F (36.8 C)   BP Readings from Last 3 Encounters:  12/16/19 130/82   Pulse Readings from Last 3 Encounters:  12/16/19 82   Pain Assessment Pain Score: 0-No pain/10  In general this is a well appearing Caucasian man in no acute distress. He's alert and oriented x4 and appropriate throughout the examination. Cardiopulmonary assessment is negative for acute distress and he exhibits normal effort.    KPS = 90  100 - Normal; no complaints; no evidence of disease. 90   - Able to carry on normal activity; minor signs or symptoms of disease. 80   - Normal activity with effort; some signs or symptoms of disease. 61   -  Cares for self; unable to carry on normal activity or to do active work. 60   - Requires occasional assistance, but is able to care for most of his personal needs. 50   - Requires considerable assistance and frequent medical care. 10   - Disabled; requires special care and assistance. 74   - Severely disabled; hospital admission is indicated although death not imminent. 103   - Very sick; hospital admission necessary; active supportive treatment necessary. 10   - Moribund; fatal processes progressing rapidly. 0     - Dead  Karnofsky DA, Abelmann WH, Craver LS and Burchenal JH [phone]) The use of the nitrogen mustards in the palliative treatment of carcinoma: with particular reference to bronchogenic carcinoma Cancer 1 [id number]  LABORATORY DATA:  No results found for: WBC, HGB, HCT, MCV, PLT No results found for: NA, K, CL, CO2 No results found for: ALT, AST, GGT, ALKPHOS, BILITOT   RADIOGRAPHY: MR ABDOMEN WWO CONTRAST  Result Date: 07/25/2020 CLINICAL DATA:  Left renal mass.  Prostate cancer. EXAM: MRI ABDOMEN WITHOUT AND WITH CONTRAST TECHNIQUE: Multiplanar multisequence MR imaging of the abdomen was performed both before and after the administration of intravenous contrast. CONTRAST:  8.53mL GADAVIST GADOBUTROL 1 MMOL/ML IV SOLN COMPARISON:  07/07/2020 FINDINGS: Lower chest: Small type 1 hiatal hernia. Hepatobiliary: Numerous scattered fluid signal intensity lesions of varying sizes in the liver as noted previously. A couple of these likely have subtle internal septation and small biliary cystadenomas cannot be totally excluded, although there no nodular or specifically worrisome components. Gallbladder unremarkable. No biliary dilatation. Pancreas: Pancreas divisum. Nonenhancing 0.7 by 0.4 cm cystic lesion in the head of the pancreas on image 15 of series 4 with no connectivity to the pancreatic duct identified. Smaller 2-3 mm fluid signal intensity lesions along the pancreatic body. Possibilities  include small postinflammatory cystic lesions or intraductal papillary mucinous neoplasm. Spleen:  Unremarkable Adrenals/Urinary Tract: 1.1 by 1.4 cm left adrenal adenoma with dropout of signal on out of phase images. Exophytic from the right kidney lower pole, a 1.5 by 1.5 by 1.6 cm rim enhancing lesion is present. The rim enhancement is moderately thick and accordingly suspicious for neoplasm such as renal cell carcinoma. Stomach/Bowel: 6  sigmoid colon diverticulosis. Vascular/Lymphatic: Aortoiliac atherosclerotic vascular disease. No tumor thrombus in the right renal vein. A lower retrocaval node measures 1.1 cm in short axis on image 68 of series 17, previously measured at 1.2 cm. There also appear to be other right iliac lymph nodes at the lower edge of imaging, as shown on prior CT. Enlarged right external iliac node on image 13 of series 8, only appreciable on the coronal images. Other: Irregular appearance of the upper margin of the prostate gland. Musculoskeletal: Degenerative endplate findings in the lumbar spine along with spondylosis and degenerative disc disease. IMPRESSION: 1. Exophytic 1.5 by 1.5 by 1.6 cm rim enhancing lesion from the RIGHT kidney lower pole suspicious for neoplasm such as renal cell carcinoma. 2. Lower retrocaval and pelvic adenopathy, probably associated with the prostate cancer rather than the small renal lesion. 3. Pancreas divisum. Several small fluid signal intensity lesions in the pancreas. These could represent small postinflammatory cystic lesions or intraductal papillary mucinous neoplasm. Follow up pancreatic protocol CT or MRI is recommended in 2 years time. This recommendation follows ACR consensus guidelines: Management of Incidental Pancreatic Cysts: A White Paper of the ACR Incidental Findings Committee. [place] 5027;74:[id number]. 4. Small type 1 hiatal hernia. 5. Numerous scattered fluid signal intensity lesions in the liver as noted previously. A couple of  these likely have subtle internal septation and small biliary cystadenomas cannot be totally excluded, although there no nodular or specifically worrisome components. 6. Irregular appearance of the upper margin of the prostate gland. 7. Degenerative endplate findings in the lumbar spine along with spondylosis and degenerative disc disease. Electronically Signed   By: Van Clines M.D.   On: 07/25/2020 11:08      IMPRESSION/PLAN: This visit was conducted via MyChart to spare the patient unnecessary potential exposure in the healthcare setting during the current COVID-19 pandemic. 1. 79 y.o. 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  His N- and M- stage reflect both true pelve iliac and retrocaval adenopathy above aortic bifurcation. We discussed the patient's workup and outlined the nature of prostate cancer in this setting. The patient patient has locally advanced and limited metastatic disease.  Accordingly, he is eligible for a variety of potential treatment options including up-front ADT with or without other systemic agents.  In terms of local control and definitive treatment of oligometastatic disease, he could also be considered for prostatectomy or LT-ADT in combination with either 8 weeks of external radiation or 5 weeks of external radiation preceded by a brachytherapy boost. We discussed the available radiation techniques, and focused on the details and logistics of delivery. He is not felt to be an ideal candidate for brachytherapy boost given his prior simple open prostatectomy procedure. With regard to his involved lymph nodes, although the highest node in this case is technically outside of the true pelvis in a retrocaval location, it is located at about the level of the L4-L5 vertebral body, and could safely be included in a pelvic IMRT plan. We discussed and outlined the risks, benefits, short and long-term effects  associated with radiotherapy. We discussed the role of SpaceOAR in reducing the rectal toxicity associated with radiotherapy. We also detailed the role of ADT in the treatment of high risk prostate and metastatic cancer and outlined the associated side effects that could be expected with this therapy. He appears to have a good understanding of his disease and our treatment recommendations  which could be considered curative intent, though we did discuss the very low likelihood of curing his disease with emphasis on a high likelihood of more durable disease control.  He was encouraged to ask questions that were answered to his stated satisfaction.  At the end of the conversation, the patient is interested in moving forward with the recommended 8 week course of external beam therapy to the prostate and involved lymph nodes, in combination with LT-ADT but is still interested in meeting with Dr. Iona Beard at [facility] for a second opinion. He is scheduled to receive his first ADT injection with Dr. Jeffie Pollock on 08/21/2020 which he plans to keep. We will share our discussion with Dr. Jeffie Pollock and make arrangements for fiducial markers and possibly SpaceOAR gel placement, in January 2022, prior to simulation, to reduce rectal toxicity from radiotherapy. We will move forward with treatment planning accordingly, in anticipation of beginning IMRT in January 2022, approximately 2 months after starting ADT.  We enjoyed meeting him and his wife today and look forward to continuing to participate in his care.   Given current concerns for patient exposure during the COVID-19 pandemic, this encounter was conducted via video-enabled MyChart visit. The patient has given verbal consent for this type of encounter. The time spent during this encounter was 60 minutes. The attendants for this meeting include Tyler Pita MD, Ashlyn Bruning PA-C, [place], and patient Ewing Fandino and his wife. During the encounter, Tyler Pita MD, Ashlyn Bruning PA-C, and scribe, Wilburn Mylar were located at [facility].  Patient Keeon Zurn and his wife were located at home.    Nicholos Johns, PA-C    Tyler Pita, MD  [facility] Direct Dial: [phone]  Fax: [phone] [email or web address]  Skype  [email or web address]  This document serves as a record of services personally performed by Tyler Pita, MD and Freeman Caldron, PA-C. It was created on their behalf by Wilburn Mylar, a trained medical scribe. The creation of this record is based on the scribe's personal observations and the provider's statements to them. This document has been checked and approved by the attending provider.     References: The impact of definitive local therapy for lymph node-positive prostate cancer: a population-based study

## 2020-08-09 ENCOUNTER — Ambulatory Visit: Payer: Medicare Other | Admitting: Radiation Oncology

## 2020-08-10 ENCOUNTER — Telehealth: Payer: Self-pay | Admitting: Radiation Oncology

## 2020-08-21 ENCOUNTER — Encounter: Payer: Self-pay | Admitting: Medical Oncology

## 2020-08-25 ENCOUNTER — Ambulatory Visit: Payer: Medicare Other | Attending: Internal Medicine

## 2020-08-25 DIAGNOSIS — Z23 Encounter for immunization: Secondary | ICD-10-CM

## 2020-08-25 NOTE — Progress Notes (Signed)
° °  Covid-19 Vaccination Clinic  Name:  Copeland Neisen    MRN: [id number] DOB: Onnie Hatchel 09, 1942  08/25/2020  Mr. Tiggs was observed post Covid-19 immunization for 15 minutes without incident. He was provided with Vaccine Information Sheet and instruction to access the V-Safe system.   Mr. Keller was instructed to call 911 with any severe reactions post vaccine:  Difficulty breathing   Swelling of face and throat   A fast heartbeat   A bad rash all over body   Dizziness and weakness   Immunizations Administered    Name Date Dose VIS Date Route   Pfizer COVID-19 Vaccine 08/25/2020  1:30 PM 0.3 mL 07/12/2020 Intramuscular   Manufacturer: [place]   Lot: [id number]   [place]: [id number]

## 2020-09-07 ENCOUNTER — Other Ambulatory Visit: Payer: Self-pay | Admitting: Urology

## 2020-09-07 DIAGNOSIS — C61 Malignant neoplasm of prostate: Secondary | ICD-10-CM

## 2020-09-23 DIAGNOSIS — C20 Malignant neoplasm of rectum: Secondary | ICD-10-CM

## 2020-09-23 DIAGNOSIS — C7800 Secondary malignant neoplasm of unspecified lung: Secondary | ICD-10-CM

## 2020-09-23 HISTORY — DX: Secondary malignant neoplasm of unspecified lung: C78.00

## 2020-09-23 HISTORY — DX: Malignant neoplasm of rectum: C20

## 2020-10-03 ENCOUNTER — Ambulatory Visit: Payer: Medicare Other | Admitting: Radiation Oncology

## 2020-10-07 ENCOUNTER — Ambulatory Visit (HOSPITAL_COMMUNITY)
Admission: EM | Admit: 2020-10-07 | Discharge: 2020-10-07 | Disposition: A | Discharge status: Home / Self Care | Payer: Medicare Other | Attending: Emergency Medicine | Admitting: Emergency Medicine

## 2020-10-07 ENCOUNTER — Encounter (HOSPITAL_COMMUNITY): Payer: Self-pay

## 2020-10-07 ENCOUNTER — Other Ambulatory Visit: Payer: Self-pay

## 2020-10-07 DIAGNOSIS — Z23 Encounter for immunization: Secondary | ICD-10-CM | POA: Diagnosis not present

## 2020-10-07 DIAGNOSIS — S61211A Laceration without foreign body of left index finger without damage to nail, initial encounter: Secondary | ICD-10-CM | POA: Diagnosis not present

## 2020-10-07 MED ORDER — BACITRACIN ZINC 500 UNIT/GM EX OINT
TOPICAL_OINTMENT | CUTANEOUS | Status: AC
  Filled 2020-10-07: qty 3.6

## 2020-10-07 MED ORDER — TETANUS-DIPHTH-ACELL PERTUSSIS 5-2.5-18.5 LF-MCG/0.5 IM SUSY
0.5000 mL | PREFILLED_SYRINGE | Freq: Once | INTRAMUSCULAR | Status: AC
  Administered 2020-10-07: 0.5 mL via INTRAMUSCULAR

## 2020-10-07 MED ORDER — BACITRACIN ZINC 500 UNIT/GM EX OINT
TOPICAL_OINTMENT | CUTANEOUS | Status: AC
  Filled 2020-10-07: qty 0.9

## 2020-10-07 MED ORDER — TETANUS-DIPHTH-ACELL PERTUSSIS 5-2.5-18.5 LF-MCG/0.5 IM SUSY
PREFILLED_SYRINGE | INTRAMUSCULAR | Status: AC
  Filled 2020-10-07: qty 0.5

## 2020-10-07 NOTE — Discharge Instructions (Addendum)
Keep your wound clean and dry.  Wash it gently twice a day with soap and water.  Apply an antibiotic cream twice a day.    Return here if you see signs of infection, such as increased pain, redness, pus-like drainage, warmth, fever, chills, or other concerning symptoms.    Your tetanus was updated today.

## 2020-10-07 NOTE — ED Triage Notes (Signed)
Patient states he cut his left index finger with kitchen scissors about 2 hours ago. Pt is aox4 and ambulatory.

## 2020-10-07 NOTE — ED Provider Notes (Signed)
[place]    CSN: [id number] Arrival date & time: 10/07/20  1601      History   Chief Complaint Chief Complaint  Patient presents with  . Laceration    About 2 hours ago    HPI Neil Perry is a 80 y.o. male.   Patient presents with a cut to his left index finger which occurred 2 hours prior to arrival.  He states he was cutting a toothpick with kitchen scissors and accidentally punctured his finger.  Bleeding controlled with direct pressure.  He denies numbness, weakness, paresthesias.  His medical history includes hypertension, mitral valve prolapse, CKD, thyroid disorder, prostate CA.  The history is provided by the patient and medical records.    Past Medical History:  Diagnosis Date  . Cataract   . Hypertension   . Prostate cancer [facility])     Patient Active Problem List   Diagnosis Date Noted  . CKD (chronic kidney disease) stage 3, GFR 30-59 ml/min (HCC) 03/04/2018  . Labile hypertension 03/04/2018  . Hyperglycemia 03/04/2018  . Screening for deficiency anemia 01/28/2018  . Healthcare maintenance 01/28/2018  . Screening for malignant neoplasm of colon 01/28/2018  . Screening for malignant neoplasm of prostate 01/28/2018  . Thyroid disorder screening 01/28/2018  . Mitral valve prolapse 01/28/2018  . Mixed hyperlipidemia 01/28/2018  . Bilateral cataracts 01/28/2018  . Essential hypertension 01/28/2018  . Malignant tumor of prostate ([place]) 01/28/2018    Past Surgical History:  Procedure Laterality Date  . cataract surgery Right   . PROSTATE BIOPSY    . PROSTATECTOMY  2018       Home Medications    Prior to Admission medications   Medication Sig Start Date End Date Taking? Authorizing Provider  amLODipine (NORVASC) 5 MG tablet TAKE 1 TABLET BY MOUTH EVERY DAY 06/05/20  Yes Marin Olp, MD  Besifloxacin HCl (BESIVANCE) 0.6 % SUSP Besivance 0.6 % eye drops,suspension    [provider]  betamethasone dipropionate 0.05 % lotion  betamethasone dipropionate 0.05 % lotion    [provider]  Difluprednate 0.05 % EMUL Durezol 0.05 % eye drops    [provider]  nepafenac (ILEVRO) 0.3 % ophthalmic suspension Ilevro 0.3 % eye drops,suspension    [provider]    Family History Family History  Problem Relation Age of Onset  . Prostate cancer Father   . Prostate cancer Brother   . Prostate cancer Paternal Grandfather   . Breast cancer Neg Hx   . Colon cancer Neg Hx   . Pancreatic cancer Neg Hx     Social History Social History   Tobacco Use  . Smoking status: Never Smoker  . Smokeless tobacco: Never Used  Vaping Use  . Vaping Use: Never used  Substance Use Topics  . Alcohol use: Yes    Alcohol/week: 2.0 standard drinks    Types: 2 Glasses of wine per week  . Drug use: Never     Allergies   Patient has no known allergies.   Review of Systems Review of Systems  Constitutional: Negative for chills and fever.  HENT: Negative for ear pain and sore throat.   Eyes: Negative for pain and visual disturbance.  Respiratory: Negative for cough and shortness of breath.   Cardiovascular: Negative for chest pain and palpitations.  Gastrointestinal: Negative for abdominal pain and vomiting.  Genitourinary: Negative for dysuria and hematuria.  Musculoskeletal: Negative for arthralgias and back pain.  Skin: Positive for wound. Negative for color change  and rash.  Neurological: Negative for syncope, weakness and numbness.  All other systems reviewed and are negative.    Physical Exam Triage Vital Signs ED Triage Vitals  Enc Vitals Group     BP      Pulse      Resp      Temp      Temp src      SpO2      Weight      Height      Head Circumference      Peak Flow      Pain Score      Pain Loc      Pain Edu?      Excl. in [place]?    No data found.  Updated Vital Signs BP (!) 163/77 (BP Location: Right Arm)   Pulse 84   Temp 98 F (36.7 C) (Oral)   Resp 18   SpO2 98%    Visual Acuity Right Eye Distance:   Left Eye Distance:   Bilateral Distance:    Right Eye Near:   Left Eye Near:    Bilateral Near:     Physical Exam Vitals and nursing note reviewed.  Constitutional:      Appearance: He is well-developed and well-nourished.  HENT:     Head: Normocephalic and atraumatic.     Mouth/Throat:     Mouth: Mucous membranes are moist.  Eyes:     Conjunctiva/sclera: Conjunctivae normal.  Cardiovascular:     Rate and Rhythm: Normal rate and regular rhythm.     Heart sounds: Normal heart sounds.  Pulmonary:     Effort: Pulmonary effort is normal. No respiratory distress.     Breath sounds: Normal breath sounds.  Abdominal:     Palpations: Abdomen is soft.     Tenderness: There is no abdominal tenderness.  Musculoskeletal:        General: No swelling, deformity or edema. Normal range of motion.     Cervical back: Neck supple.  Skin:    General: Skin is warm and dry.     Capillary Refill: Capillary refill takes less than 2 seconds.     Findings: Lesion present.     Comments: 2 mm puncture wound on tip on left index finger; no active bleeding.   Neurological:     General: No focal deficit present.     Mental Status: He is alert and oriented to person, place, and time.     Sensory: No sensory deficit.     Motor: No weakness.     Gait: Gait normal.  Psychiatric:        Mood and Affect: Mood and affect and mood normal.        Behavior: Behavior normal.      UC Treatments / Results  Labs (all labs ordered are listed, but only abnormal results are displayed) Labs Reviewed - No data to display  EKG   Radiology No results found.  Procedures Procedures (including critical care time)  Medications Ordered in UC Medications  Tdap (BOOSTRIX) injection 0.5 mL (0.5 mLs Intramuscular Given 10/07/20 1735)    Initial Impression / Assessment and Plan / UC Course  I have reviewed the triage vital signs and the nursing notes.  Pertinent labs &  imaging results that were available during my care of the patient were reviewed by me and considered in my medical decision making (see chart for details).   Laceration of left index finger.  No sutures required.  Tetanus updated today.  Wound care instructions and signs of infection discussed with patient.  Instructed patient to return here if he notes signs of infection.  Patient agrees to plan of care.   Final Clinical Impressions(s) / UC Diagnoses   Final diagnoses:  Laceration of left index finger without foreign body without damage to nail, initial encounter     Discharge Instructions     Keep your wound clean and dry.  Wash it gently twice a day with soap and water.  Apply an antibiotic cream twice a day.    Return here if you see signs of infection, such as increased pain, redness, pus-like drainage, warmth, fever, chills, or other concerning symptoms.    Your tetanus was updated today.        ED Prescriptions    None     PDMP not reviewed this encounter.   Sharion Balloon, NP 10/07/20 1735

## 2020-10-23 ENCOUNTER — Telehealth: Payer: Self-pay | Admitting: *Deleted

## 2020-10-23 ENCOUNTER — Telehealth: Payer: Self-pay | Admitting: Radiation Oncology

## 2020-10-23 NOTE — Telephone Encounter (Signed)
Received voicemail message from patient confused about tomorrow's appointment. Route message to [employer]. Enid Derry will contact patient to clarify.

## 2020-10-23 NOTE — Telephone Encounter (Signed)
Called patient to remind of sim appt. for 10-24-20, spoke with patient and he is aware of this appt. 

## 2020-10-24 ENCOUNTER — Ambulatory Visit
Admission: RE | Admit: 2020-10-24 | Discharge: 2020-10-24 | Disposition: A | Discharge status: Home / Self Care | Payer: Medicare Other | Source: Ambulatory Visit | Attending: Radiation Oncology | Admitting: Radiation Oncology

## 2020-10-24 ENCOUNTER — Other Ambulatory Visit: Payer: Self-pay

## 2020-10-24 ENCOUNTER — Ambulatory Visit (HOSPITAL_COMMUNITY): Payer: Medicare Other

## 2020-10-24 ENCOUNTER — Encounter (HOSPITAL_COMMUNITY): Payer: Self-pay

## 2020-10-24 DIAGNOSIS — C61 Malignant neoplasm of prostate: Secondary | ICD-10-CM | POA: Insufficient documentation

## 2020-10-24 DIAGNOSIS — Z510 Encounter for antineoplastic radiation therapy: Secondary | ICD-10-CM | POA: Insufficient documentation

## 2020-10-24 NOTE — Progress Notes (Signed)
  Radiation Oncology         [phone] ________________________________  Name: Neil Perry MRN: [id number]  Date: 10/24/2020  DOB: August 09, 1941  SIMULATION AND TREATMENT PLANNING NOTE    ICD-10-CM   1. Malignant tumor of prostate ([place])  C61     DIAGNOSIS:  80 y.o. 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  His N- and M- stage reflect both true pelvic iliac and retrocaval adenopathy above aortic bifurcation.  NARRATIVE:  The patient was brought to the [facility].  Identity was confirmed.  All relevant records and images related to the planned course of therapy were reviewed.  The patient freely provided informed written consent to proceed with treatment after reviewing the details related to the planned course of therapy. The consent form was witnessed and verified by the simulation staff.  Then, the patient was set-up in a stable reproducible supine position for radiation therapy.  A vacuum lock pillow device was custom fabricated to position his legs in a reproducible immobilized position.  Then, I performed a urethrogram under sterile conditions to identify the prostatic bed.  CT images were obtained.  Surface markings were placed.  The CT images were loaded into the planning software.  Then the prostate bed target, pelvic lymph node target and avoidance structures including the rectum, bladder, bowel and hips were contoured.  Treatment planning then occurred.  The radiation prescription was entered and confirmed.  A total of one complex treatment devices were fabricated. I have requested : Intensity Modulated Radiotherapy (IMRT) is medically necessary for this case for the following reason:  Rectal sparing.Marland Kitchen  PLAN:  The patient will receive 45 Gy in 25 fractions of 1.8 Gy, followed by a boost to the prostate and positive nodes to a total dose of 75 Gy with 15 additional fractions of 2  Gy.   ________________________________  Sheral Apley Tammi Klippel, M.D.  This document serves as a record of services personally performed by Tyler Pita, MD. It was created on his behalf by Wilburn Mylar, a trained medical scribe. The creation of this record is based on the scribe's personal observations and the provider's statements to them. This document has been checked and approved by the attending provider.

## 2020-11-01 DIAGNOSIS — Z510 Encounter for antineoplastic radiation therapy: Secondary | ICD-10-CM | POA: Diagnosis not present

## 2020-11-02 ENCOUNTER — Other Ambulatory Visit: Payer: Self-pay

## 2020-11-02 ENCOUNTER — Ambulatory Visit
Admission: RE | Admit: 2020-11-02 | Discharge: 2020-11-02 | Disposition: A | Discharge status: Home / Self Care | Payer: Medicare Other | Source: Ambulatory Visit | Attending: Radiation Oncology | Admitting: Radiation Oncology

## 2020-11-02 DIAGNOSIS — Z510 Encounter for antineoplastic radiation therapy: Secondary | ICD-10-CM | POA: Diagnosis not present

## 2020-11-03 ENCOUNTER — Other Ambulatory Visit: Payer: Self-pay | Admitting: Radiation Oncology

## 2020-11-03 ENCOUNTER — Ambulatory Visit
Admission: RE | Admit: 2020-11-03 | Discharge: 2020-11-03 | Disposition: A | Discharge status: Home / Self Care | Payer: Medicare Other | Source: Ambulatory Visit | Attending: Radiation Oncology | Admitting: Radiation Oncology

## 2020-11-03 ENCOUNTER — Other Ambulatory Visit: Payer: Self-pay

## 2020-11-03 DIAGNOSIS — Z510 Encounter for antineoplastic radiation therapy: Secondary | ICD-10-CM | POA: Diagnosis not present

## 2020-11-03 MED ORDER — ALFUZOSIN HCL ER 10 MG PO TB24: 10.0000 mg | ORAL_TABLET | Freq: Every day | ORAL | 5 refills | Status: DC

## 2020-11-03 NOTE — Progress Notes (Signed)
Complete post sim education with patient today. Oriented patient to staff and routine of the clinic. Provided patient with RADIATION THERAPY AND YOU handbook then, reviewed pertinent information. Educated patient reference potential side effects and management such as fatigue, diarrhea and urinary/bladder changes. Answered all patient questions to the best of my ability. Provided patient with my business card and encouraged him to call with future needs. Patient verbalized understanding of all reviewed.  

## 2020-11-06 ENCOUNTER — Ambulatory Visit
Admission: RE | Admit: 2020-11-06 | Discharge: 2020-11-06 | Disposition: A | Discharge status: Home / Self Care | Payer: Medicare Other | Source: Ambulatory Visit | Attending: Radiation Oncology | Admitting: Radiation Oncology

## 2020-11-06 DIAGNOSIS — Z510 Encounter for antineoplastic radiation therapy: Secondary | ICD-10-CM | POA: Diagnosis not present

## 2020-11-07 ENCOUNTER — Ambulatory Visit
Admission: RE | Admit: 2020-11-07 | Discharge: 2020-11-07 | Disposition: A | Discharge status: Home / Self Care | Payer: Medicare Other | Source: Ambulatory Visit | Attending: Radiation Oncology | Admitting: Radiation Oncology

## 2020-11-07 DIAGNOSIS — Z510 Encounter for antineoplastic radiation therapy: Secondary | ICD-10-CM | POA: Diagnosis not present

## 2020-11-08 ENCOUNTER — Ambulatory Visit
Admission: RE | Admit: 2020-11-08 | Discharge: 2020-11-08 | Disposition: A | Discharge status: Home / Self Care | Payer: Medicare Other | Source: Ambulatory Visit | Attending: Radiation Oncology | Admitting: Radiation Oncology

## 2020-11-08 DIAGNOSIS — Z510 Encounter for antineoplastic radiation therapy: Secondary | ICD-10-CM | POA: Diagnosis not present

## 2020-11-09 ENCOUNTER — Ambulatory Visit
Admission: RE | Admit: 2020-11-09 | Discharge: 2020-11-09 | Disposition: A | Discharge status: Home / Self Care | Payer: Medicare Other | Source: Ambulatory Visit | Attending: Radiation Oncology | Admitting: Radiation Oncology

## 2020-11-09 DIAGNOSIS — Z510 Encounter for antineoplastic radiation therapy: Secondary | ICD-10-CM | POA: Diagnosis not present

## 2020-11-10 ENCOUNTER — Ambulatory Visit
Admission: RE | Admit: 2020-11-10 | Discharge: 2020-11-10 | Disposition: A | Discharge status: Home / Self Care | Payer: Medicare Other | Source: Ambulatory Visit | Attending: Radiation Oncology | Admitting: Radiation Oncology

## 2020-11-10 DIAGNOSIS — Z510 Encounter for antineoplastic radiation therapy: Secondary | ICD-10-CM | POA: Diagnosis not present

## 2020-11-13 ENCOUNTER — Ambulatory Visit
Admission: RE | Admit: 2020-11-13 | Discharge: 2020-11-13 | Disposition: A | Discharge status: Home / Self Care | Payer: Medicare Other | Source: Ambulatory Visit | Attending: Radiation Oncology | Admitting: Radiation Oncology

## 2020-11-13 ENCOUNTER — Other Ambulatory Visit: Payer: Self-pay

## 2020-11-13 DIAGNOSIS — Z510 Encounter for antineoplastic radiation therapy: Secondary | ICD-10-CM | POA: Diagnosis not present

## 2020-11-14 ENCOUNTER — Ambulatory Visit
Admission: RE | Admit: 2020-11-14 | Discharge: 2020-11-14 | Disposition: A | Discharge status: Home / Self Care | Payer: Medicare Other | Source: Ambulatory Visit | Attending: Radiation Oncology | Admitting: Radiation Oncology

## 2020-11-14 ENCOUNTER — Other Ambulatory Visit: Payer: Self-pay

## 2020-11-14 DIAGNOSIS — Z510 Encounter for antineoplastic radiation therapy: Secondary | ICD-10-CM | POA: Diagnosis not present

## 2020-11-15 ENCOUNTER — Ambulatory Visit
Admission: RE | Admit: 2020-11-15 | Discharge: 2020-11-15 | Disposition: A | Discharge status: Home / Self Care | Payer: Medicare Other | Source: Ambulatory Visit | Attending: Radiation Oncology | Admitting: Radiation Oncology

## 2020-11-15 DIAGNOSIS — Z510 Encounter for antineoplastic radiation therapy: Secondary | ICD-10-CM | POA: Diagnosis not present

## 2020-11-16 ENCOUNTER — Other Ambulatory Visit: Payer: Self-pay

## 2020-11-16 ENCOUNTER — Ambulatory Visit
Admission: RE | Admit: 2020-11-16 | Discharge: 2020-11-16 | Disposition: A | Discharge status: Home / Self Care | Payer: Medicare Other | Source: Ambulatory Visit | Attending: Radiation Oncology | Admitting: Radiation Oncology

## 2020-11-16 DIAGNOSIS — Z510 Encounter for antineoplastic radiation therapy: Secondary | ICD-10-CM | POA: Diagnosis not present

## 2020-11-17 ENCOUNTER — Other Ambulatory Visit: Payer: Self-pay

## 2020-11-17 ENCOUNTER — Ambulatory Visit
Admission: RE | Admit: 2020-11-17 | Discharge: 2020-11-17 | Disposition: A | Discharge status: Home / Self Care | Payer: Medicare Other | Source: Ambulatory Visit | Attending: Radiation Oncology | Admitting: Radiation Oncology

## 2020-11-17 DIAGNOSIS — Z510 Encounter for antineoplastic radiation therapy: Secondary | ICD-10-CM | POA: Diagnosis not present

## 2020-11-20 ENCOUNTER — Other Ambulatory Visit: Payer: Self-pay

## 2020-11-20 ENCOUNTER — Ambulatory Visit
Admission: RE | Admit: 2020-11-20 | Discharge: 2020-11-20 | Disposition: A | Discharge status: Home / Self Care | Payer: Medicare Other | Source: Ambulatory Visit | Attending: Radiation Oncology | Admitting: Radiation Oncology

## 2020-11-20 DIAGNOSIS — Z510 Encounter for antineoplastic radiation therapy: Secondary | ICD-10-CM | POA: Diagnosis not present

## 2020-11-21 ENCOUNTER — Other Ambulatory Visit: Payer: Self-pay

## 2020-11-21 ENCOUNTER — Ambulatory Visit
Admission: RE | Admit: 2020-11-21 | Discharge: 2020-11-21 | Disposition: A | Discharge status: Home / Self Care | Payer: Medicare Other | Source: Ambulatory Visit | Attending: Radiation Oncology | Admitting: Radiation Oncology

## 2020-11-21 DIAGNOSIS — C61 Malignant neoplasm of prostate: Secondary | ICD-10-CM | POA: Insufficient documentation

## 2020-11-21 DIAGNOSIS — Z510 Encounter for antineoplastic radiation therapy: Secondary | ICD-10-CM | POA: Insufficient documentation

## 2020-11-22 ENCOUNTER — Ambulatory Visit
Admission: RE | Admit: 2020-11-22 | Discharge: 2020-11-22 | Disposition: A | Discharge status: Home / Self Care | Payer: Medicare Other | Source: Ambulatory Visit | Attending: Radiation Oncology | Admitting: Radiation Oncology

## 2020-11-22 ENCOUNTER — Other Ambulatory Visit: Payer: Self-pay

## 2020-11-22 DIAGNOSIS — Z510 Encounter for antineoplastic radiation therapy: Secondary | ICD-10-CM | POA: Diagnosis not present

## 2020-11-23 ENCOUNTER — Ambulatory Visit
Admission: RE | Admit: 2020-11-23 | Discharge: 2020-11-23 | Disposition: A | Discharge status: Home / Self Care | Payer: Medicare Other | Source: Ambulatory Visit | Attending: Radiation Oncology | Admitting: Radiation Oncology

## 2020-11-23 ENCOUNTER — Other Ambulatory Visit: Payer: Self-pay

## 2020-11-23 DIAGNOSIS — Z510 Encounter for antineoplastic radiation therapy: Secondary | ICD-10-CM | POA: Diagnosis not present

## 2020-11-24 ENCOUNTER — Other Ambulatory Visit: Payer: Self-pay

## 2020-11-24 ENCOUNTER — Ambulatory Visit
Admission: RE | Admit: 2020-11-24 | Discharge: 2020-11-24 | Disposition: A | Discharge status: Home / Self Care | Payer: Medicare Other | Source: Ambulatory Visit | Attending: Radiation Oncology | Admitting: Radiation Oncology

## 2020-11-24 DIAGNOSIS — Z510 Encounter for antineoplastic radiation therapy: Secondary | ICD-10-CM | POA: Diagnosis not present

## 2020-11-27 ENCOUNTER — Ambulatory Visit
Admission: RE | Admit: 2020-11-27 | Discharge: 2020-11-27 | Disposition: A | Discharge status: Home / Self Care | Payer: Medicare Other | Source: Ambulatory Visit | Attending: Radiation Oncology | Admitting: Radiation Oncology

## 2020-11-27 ENCOUNTER — Other Ambulatory Visit: Payer: Self-pay

## 2020-11-27 DIAGNOSIS — Z510 Encounter for antineoplastic radiation therapy: Secondary | ICD-10-CM | POA: Diagnosis not present

## 2020-11-28 ENCOUNTER — Ambulatory Visit
Admission: RE | Admit: 2020-11-28 | Discharge: 2020-11-28 | Disposition: A | Discharge status: Home / Self Care | Payer: Medicare Other | Source: Ambulatory Visit | Attending: Radiation Oncology | Admitting: Radiation Oncology

## 2020-11-28 ENCOUNTER — Other Ambulatory Visit: Payer: Self-pay

## 2020-11-28 ENCOUNTER — Other Ambulatory Visit: Payer: Self-pay | Admitting: Radiation Oncology

## 2020-11-28 DIAGNOSIS — Z510 Encounter for antineoplastic radiation therapy: Secondary | ICD-10-CM | POA: Diagnosis not present

## 2020-11-29 ENCOUNTER — Ambulatory Visit
Admission: RE | Admit: 2020-11-29 | Discharge: 2020-11-29 | Disposition: A | Discharge status: Home / Self Care | Payer: Medicare Other | Source: Ambulatory Visit | Attending: Radiation Oncology | Admitting: Radiation Oncology

## 2020-11-29 ENCOUNTER — Other Ambulatory Visit: Payer: Self-pay

## 2020-11-29 DIAGNOSIS — Z510 Encounter for antineoplastic radiation therapy: Secondary | ICD-10-CM | POA: Diagnosis not present

## 2020-11-30 ENCOUNTER — Other Ambulatory Visit: Payer: Self-pay

## 2020-11-30 ENCOUNTER — Other Ambulatory Visit: Payer: Self-pay | Admitting: Radiation Oncology

## 2020-11-30 ENCOUNTER — Ambulatory Visit
Admission: RE | Admit: 2020-11-30 | Discharge: 2020-11-30 | Disposition: A | Discharge status: Home / Self Care | Payer: Medicare Other | Source: Ambulatory Visit | Attending: Radiation Oncology | Admitting: Radiation Oncology

## 2020-11-30 DIAGNOSIS — Z510 Encounter for antineoplastic radiation therapy: Secondary | ICD-10-CM | POA: Diagnosis not present

## 2020-12-01 ENCOUNTER — Ambulatory Visit
Admission: RE | Admit: 2020-12-01 | Discharge: 2020-12-01 | Disposition: A | Discharge status: Home / Self Care | Payer: Medicare Other | Source: Ambulatory Visit | Attending: Radiation Oncology | Admitting: Radiation Oncology

## 2020-12-01 ENCOUNTER — Other Ambulatory Visit: Payer: Self-pay

## 2020-12-01 DIAGNOSIS — Z510 Encounter for antineoplastic radiation therapy: Secondary | ICD-10-CM | POA: Diagnosis not present

## 2020-12-04 ENCOUNTER — Telehealth: Payer: Self-pay | Admitting: Radiation Oncology

## 2020-12-04 ENCOUNTER — Other Ambulatory Visit: Payer: Self-pay

## 2020-12-04 ENCOUNTER — Ambulatory Visit
Admission: RE | Admit: 2020-12-04 | Discharge: 2020-12-04 | Disposition: A | Discharge status: Home / Self Care | Payer: Medicare Other | Source: Ambulatory Visit | Attending: Radiation Oncology | Admitting: Radiation Oncology

## 2020-12-04 DIAGNOSIS — Z510 Encounter for antineoplastic radiation therapy: Secondary | ICD-10-CM | POA: Diagnosis not present

## 2020-12-04 NOTE — Telephone Encounter (Signed)
Phoned patient to inquire if he picked up updated alfluzosin script Friday, March 11th. No answer. Left detailed voicemail message and requested a return call. Provided my direct number.

## 2020-12-05 ENCOUNTER — Ambulatory Visit
Admission: RE | Admit: 2020-12-05 | Discharge: 2020-12-05 | Disposition: A | Discharge status: Home / Self Care | Payer: Medicare Other | Source: Ambulatory Visit | Attending: Radiation Oncology | Admitting: Radiation Oncology

## 2020-12-05 DIAGNOSIS — Z510 Encounter for antineoplastic radiation therapy: Secondary | ICD-10-CM | POA: Diagnosis not present

## 2020-12-06 ENCOUNTER — Other Ambulatory Visit: Payer: Self-pay

## 2020-12-06 ENCOUNTER — Ambulatory Visit
Admission: RE | Admit: 2020-12-06 | Discharge: 2020-12-06 | Disposition: A | Discharge status: Home / Self Care | Payer: Medicare Other | Source: Ambulatory Visit | Attending: Radiation Oncology | Admitting: Radiation Oncology

## 2020-12-06 DIAGNOSIS — Z510 Encounter for antineoplastic radiation therapy: Secondary | ICD-10-CM | POA: Diagnosis not present

## 2020-12-07 ENCOUNTER — Ambulatory Visit
Admission: RE | Admit: 2020-12-07 | Discharge: 2020-12-07 | Disposition: A | Discharge status: Home / Self Care | Payer: Medicare Other | Source: Ambulatory Visit | Attending: Radiation Oncology | Admitting: Radiation Oncology

## 2020-12-07 ENCOUNTER — Telehealth: Payer: Self-pay | Admitting: Radiation Oncology

## 2020-12-07 DIAGNOSIS — Z510 Encounter for antineoplastic radiation therapy: Secondary | ICD-10-CM | POA: Diagnosis not present

## 2020-12-07 NOTE — Telephone Encounter (Signed)
Phoned [facility] to inquire why the patient is struggling to get his alfluzosin. I spoke with June. She reports the medication was on hold but she can fill it now. June committed to filling the medication and sending a text alert to the patient it is now ready for pick up. June confirmed the new script directed the patient to take alfluzosin 20 mg daily.

## 2020-12-08 ENCOUNTER — Other Ambulatory Visit: Payer: Self-pay

## 2020-12-08 ENCOUNTER — Ambulatory Visit
Admission: RE | Admit: 2020-12-08 | Discharge: 2020-12-08 | Disposition: A | Discharge status: Home / Self Care | Payer: Medicare Other | Source: Ambulatory Visit | Attending: Radiation Oncology | Admitting: Radiation Oncology

## 2020-12-08 DIAGNOSIS — Z510 Encounter for antineoplastic radiation therapy: Secondary | ICD-10-CM | POA: Diagnosis not present

## 2020-12-11 ENCOUNTER — Ambulatory Visit
Admission: RE | Admit: 2020-12-11 | Discharge: 2020-12-11 | Disposition: A | Discharge status: Home / Self Care | Payer: Medicare Other | Source: Ambulatory Visit | Attending: Radiation Oncology | Admitting: Radiation Oncology

## 2020-12-11 ENCOUNTER — Other Ambulatory Visit: Payer: Self-pay

## 2020-12-11 DIAGNOSIS — Z510 Encounter for antineoplastic radiation therapy: Secondary | ICD-10-CM | POA: Diagnosis not present

## 2020-12-12 ENCOUNTER — Ambulatory Visit
Admission: RE | Admit: 2020-12-12 | Discharge: 2020-12-12 | Disposition: A | Discharge status: Home / Self Care | Payer: Medicare Other | Source: Ambulatory Visit | Attending: Radiation Oncology | Admitting: Radiation Oncology

## 2020-12-12 DIAGNOSIS — Z510 Encounter for antineoplastic radiation therapy: Secondary | ICD-10-CM | POA: Diagnosis not present

## 2020-12-13 ENCOUNTER — Other Ambulatory Visit: Payer: Self-pay

## 2020-12-13 ENCOUNTER — Ambulatory Visit
Admission: RE | Admit: 2020-12-13 | Discharge: 2020-12-13 | Disposition: A | Discharge status: Home / Self Care | Payer: Medicare Other | Source: Ambulatory Visit | Attending: Radiation Oncology | Admitting: Radiation Oncology

## 2020-12-13 DIAGNOSIS — Z510 Encounter for antineoplastic radiation therapy: Secondary | ICD-10-CM | POA: Diagnosis not present

## 2020-12-14 ENCOUNTER — Ambulatory Visit
Admission: RE | Admit: 2020-12-14 | Discharge: 2020-12-14 | Disposition: A | Discharge status: Home / Self Care | Payer: Medicare Other | Source: Ambulatory Visit | Attending: Radiation Oncology | Admitting: Radiation Oncology

## 2020-12-14 DIAGNOSIS — Z510 Encounter for antineoplastic radiation therapy: Secondary | ICD-10-CM | POA: Diagnosis not present

## 2020-12-15 ENCOUNTER — Ambulatory Visit
Admission: RE | Admit: 2020-12-15 | Discharge: 2020-12-15 | Disposition: A | Discharge status: Home / Self Care | Payer: Medicare Other | Source: Ambulatory Visit | Attending: Radiation Oncology | Admitting: Radiation Oncology

## 2020-12-15 ENCOUNTER — Other Ambulatory Visit: Payer: Self-pay

## 2020-12-15 DIAGNOSIS — Z510 Encounter for antineoplastic radiation therapy: Secondary | ICD-10-CM | POA: Diagnosis not present

## 2020-12-18 ENCOUNTER — Other Ambulatory Visit: Payer: Self-pay

## 2020-12-18 ENCOUNTER — Ambulatory Visit
Admission: RE | Admit: 2020-12-18 | Discharge: 2020-12-18 | Disposition: A | Discharge status: Home / Self Care | Payer: Medicare Other | Source: Ambulatory Visit | Attending: Radiation Oncology | Admitting: Radiation Oncology

## 2020-12-18 DIAGNOSIS — Z510 Encounter for antineoplastic radiation therapy: Secondary | ICD-10-CM | POA: Diagnosis not present

## 2020-12-19 ENCOUNTER — Other Ambulatory Visit: Payer: Self-pay | Admitting: Family Medicine

## 2020-12-19 ENCOUNTER — Ambulatory Visit
Admission: RE | Admit: 2020-12-19 | Discharge: 2020-12-19 | Disposition: A | Discharge status: Home / Self Care | Payer: Medicare Other | Source: Ambulatory Visit | Attending: Radiation Oncology | Admitting: Radiation Oncology

## 2020-12-19 DIAGNOSIS — Z510 Encounter for antineoplastic radiation therapy: Secondary | ICD-10-CM | POA: Diagnosis not present

## 2020-12-20 ENCOUNTER — Ambulatory Visit
Admission: RE | Admit: 2020-12-20 | Discharge: 2020-12-20 | Disposition: A | Discharge status: Home / Self Care | Payer: Medicare Other | Source: Ambulatory Visit | Attending: Radiation Oncology | Admitting: Radiation Oncology

## 2020-12-20 ENCOUNTER — Other Ambulatory Visit: Payer: Self-pay

## 2020-12-20 DIAGNOSIS — Z510 Encounter for antineoplastic radiation therapy: Secondary | ICD-10-CM | POA: Diagnosis not present

## 2020-12-21 ENCOUNTER — Ambulatory Visit
Admission: RE | Admit: 2020-12-21 | Discharge: 2020-12-21 | Disposition: A | Discharge status: Home / Self Care | Payer: Medicare Other | Source: Ambulatory Visit | Attending: Radiation Oncology | Admitting: Radiation Oncology

## 2020-12-21 ENCOUNTER — Other Ambulatory Visit: Payer: Self-pay

## 2020-12-21 DIAGNOSIS — Z510 Encounter for antineoplastic radiation therapy: Secondary | ICD-10-CM | POA: Diagnosis not present

## 2020-12-22 ENCOUNTER — Ambulatory Visit
Admission: RE | Admit: 2020-12-22 | Discharge: 2020-12-22 | Disposition: A | Discharge status: Home / Self Care | Payer: Medicare Other | Source: Ambulatory Visit | Attending: Radiation Oncology | Admitting: Radiation Oncology

## 2020-12-22 ENCOUNTER — Other Ambulatory Visit: Payer: Self-pay

## 2020-12-22 DIAGNOSIS — Z510 Encounter for antineoplastic radiation therapy: Secondary | ICD-10-CM | POA: Diagnosis not present

## 2020-12-22 DIAGNOSIS — C61 Malignant neoplasm of prostate: Secondary | ICD-10-CM | POA: Insufficient documentation

## 2020-12-25 ENCOUNTER — Ambulatory Visit: Payer: Medicare Other

## 2020-12-26 ENCOUNTER — Ambulatory Visit
Admission: RE | Admit: 2020-12-26 | Discharge: 2020-12-26 | Disposition: A | Discharge status: Home / Self Care | Payer: Medicare Other | Source: Ambulatory Visit | Attending: Radiation Oncology | Admitting: Radiation Oncology

## 2020-12-26 ENCOUNTER — Other Ambulatory Visit: Payer: Self-pay

## 2020-12-26 DIAGNOSIS — Z510 Encounter for antineoplastic radiation therapy: Secondary | ICD-10-CM | POA: Diagnosis not present

## 2020-12-27 ENCOUNTER — Other Ambulatory Visit: Payer: Self-pay

## 2020-12-27 ENCOUNTER — Ambulatory Visit: Payer: Medicare Other

## 2020-12-27 ENCOUNTER — Ambulatory Visit
Admission: RE | Admit: 2020-12-27 | Discharge: 2020-12-27 | Disposition: A | Discharge status: Home / Self Care | Payer: Medicare Other | Source: Ambulatory Visit | Attending: Radiation Oncology | Admitting: Radiation Oncology

## 2020-12-27 DIAGNOSIS — Z510 Encounter for antineoplastic radiation therapy: Secondary | ICD-10-CM | POA: Diagnosis not present

## 2020-12-28 ENCOUNTER — Other Ambulatory Visit: Payer: Self-pay

## 2020-12-28 ENCOUNTER — Encounter: Payer: Self-pay | Admitting: Urology

## 2020-12-28 ENCOUNTER — Ambulatory Visit
Admission: RE | Admit: 2020-12-28 | Discharge: 2020-12-28 | Disposition: A | Discharge status: Home / Self Care | Payer: Medicare Other | Source: Ambulatory Visit | Attending: Radiation Oncology | Admitting: Radiation Oncology

## 2020-12-28 DIAGNOSIS — Z510 Encounter for antineoplastic radiation therapy: Secondary | ICD-10-CM | POA: Diagnosis not present

## 2021-01-12 ENCOUNTER — Telehealth: Payer: Self-pay | Admitting: Radiation Oncology

## 2021-01-12 NOTE — Telephone Encounter (Signed)
Received email from therapist, Alcus Dad, requesting I contact this patient. Phoned patient to inquire. Spoke with patient and wife over speaker phone. Patient states, " I want Dr. Tammi Klippel to know I had three episodes of nausea, vomiting and diarrhea at the same time on 4/8, 4/9, and 4/15." Patient denies an episode since 01/05/21. Patient completed radiation therapy on 12/28/2020. Patient reports he was taking Imodium intermittently during that time frame but hasn't taken any since 01/05/21. Patient reports he has to strain to have a morning bowel movement. Patient goes onto explain the morning bowel movement is unformed but not diarrhea. Also, patient reports having a normal bowel movement 2-3 hours later that is formed and he doesn't have to strain. Patient's wife concerned that he is very tired and sleeps often. Patient reports nocturia x 5. Patient denies dysuria, hematuria or urinary leakage. Patient and wife understand this RN will report these findings to the provider then, phone back with directions.   Note:Patient scheduled for telephone follow up with Ashlyn Bruning, PA-C on 01/31/21 at 1100. Patient reports he does not have a urology follow up appointment yet.

## 2021-01-12 NOTE — Telephone Encounter (Signed)
Phoned patient back as promised. Spoke with patient and his wife over speaker phone. Per Freeman Caldron, PA-C's request I verbalized the following:It is impossible to know if the nausea or vomiting was related to the radiation or just bad luck and coincidence but it sounds like he is on the mend. Remind him to continue drinking plenty of fluids, avoiding heavy intake of caffeine or acidic juices and also avoiding spicy and or tomato-based foods that can potentially aggravate the bowels and bladder. To help with the nocturia, he should avoid drinking any real volume of fluids after dinner in the evenings. Also reassure them that the tiredness/fatigue is normal and will gradually improve with some time. Confirmed follow up appointment with patient. Encouraged patient and wife to phone with future needs. Both verbalized understanding of all reviewed.

## 2021-01-24 NOTE — Progress Notes (Signed)
Patient denies any dysuria ,hematuria ,leakage or urgency.patient reports nocturia 4-5. Patient states that he empties his bladder with urination.Patient reports a moderate steam. Patient states that he has a continuous stream. Patient   states that he has constipation ,but that the is taking a stool softner. Patient is states that he has to schedule an appointment with is urologist. IPPS was a 6

## 2021-01-31 ENCOUNTER — Ambulatory Visit
Admission: RE | Admit: 2021-01-31 | Discharge: 2021-01-31 | Disposition: A | Discharge status: Home / Self Care | Payer: Medicare Other | Source: Ambulatory Visit | Attending: Urology | Admitting: Urology

## 2021-01-31 ENCOUNTER — Other Ambulatory Visit: Payer: Self-pay

## 2021-01-31 DIAGNOSIS — C61 Malignant neoplasm of prostate: Secondary | ICD-10-CM

## 2021-01-31 MED ORDER — TAMSULOSIN HCL 0.4 MG PO CAPS: 0.4000 mg | ORAL_CAPSULE | Freq: Every day | ORAL | 2 refills | Status: DC

## 2021-01-31 NOTE — Progress Notes (Addendum)
  Radiation Oncology         [phone] ________________________________  Name: Neil Perry MRN: [id number]  Date: 12/28/2020  DOB: Apr 22, 1941  End of Treatment Note  Diagnosis:  80 y.o.gentleman with Stage pT2cN1 M1aadenocarcinoma of the prostate with Gleason Score of 4+4, and PSA of10.6.His N- and M- stage reflect both true pelvic iliac and retrocaval adenopathy above aortic bifurcation.     Indication for treatment:  Curative, Definitive Radiotherapy       Radiation treatment dates:   11/02/20 - 12/28/20  Site/dose:  1. The prostate, seminal vesicles, and pelvic lymph nodes were initially treated to 45 Gy in 25 fractions of 1.8 Gy  2. The prostate and positive pelvic/retrocaval nodes were boosted to 75 Gy with 15 additional fractions of 2.0 Gy   Beams/energy:  1. The prostate, seminal vesicles, and pelvic lymph nodes were initially treated using VMAT intensity modulated radiotherapy delivering 6 megavolt photons. Image guidance was performed with CB-CT studies prior to each fraction. He was immobilized with a body fix lower extremity mold.  2. the prostate and positive pelvic/retrocaval nodes were boosted using VMAT intensity modulated radiotherapy delivering 6 megavolt photons. Image guidance was performed with CB-CT studies prior to each fraction. He was immobilized with a body fix lower extremity mold.  Narrative: The patient tolerated radiation treatment relatively well with only minor urinary irritation and modest fatigue.  He did report increased frequency, urgency, nocturia 5-6 times per night despite taking alfuzosin which was prescribed during treatment.  He specifically denied dysuria, gross hematuria or straining to void.  He did have some loose bowels which was managed with Imodium as needed.  Plan: The patient has completed radiation treatment. He will return to radiation oncology clinic for routine followup in one month. I advised him to call or return sooner if he has  any questions or concerns related to his recovery or treatment. ________________________________  Sheral Apley. Tammi Klippel, M.D.

## 2021-01-31 NOTE — Addendum Note (Signed)
Encounter addended by: Freeman Caldron, PA-C on: 01/31/2021 12:08 PM  Actions taken: Level of Service modified, Clinical Note Signed

## 2021-01-31 NOTE — Progress Notes (Signed)
Radiation Oncology         (336) [phone] ________________________________  Name: Carlon Chaloux MRN: [id number]  Date: 01/31/2021  DOB: 09-11-1941  Post Treatment Note  CC: Vivi Barrack, MD  Irine Seal, MD  Diagnosis:   80 y.o.gentleman with Stage pT2cN1 M1aadenocarcinoma of the prostate with Gleason Score of 4+4, and PSA of10.6.His N- and M- stage reflect both true pelviciliac and retrocaval adenopathy above aortic bifurcation.  Interval Since Last Radiation:  4.5 weeks  11/02/20 - 12/28/20: 1. The prostate, seminal vesicles, and pelvic lymph nodes were initially treated to 45 Gy in 25 fractions of 1.8 Gy  2. The prostate only was boosted to 75 Gy with 15 additional fractions of 2.0 Gy each; concurrent with LT-ADT (started with Mills Koller on 08/21/20)  Narrative:  I spoke with the patient to conduct his routine scheduled 1 month follow up visit via telephone to spare the patient unnecessary potential exposure in the healthcare setting during the current COVID-19 pandemic.  The patient was notified in advance and gave permission to proceed with this visit format.  He tolerated radiation treatment relatively well with only minor urinary irritation and modest fatigue.  He did report increased frequency, urgency, nocturia 5-6 times per night despite taking alfuzosin which was prescribed during treatment.  He specifically denied dysuria, gross hematuria or straining to void.  He did have some loose bowels which was managed with Imodium as needed.                              On review of systems, the patient states that he is doing well in general.  He did experience approximately 3 episodes of nausea/vomiting/diarrhea just following completion of treatment but this resolved on its own and has not recurred since 01/05/2021.  He does continue with nocturia 4-5 times per night and is no longer taking the alfuzosin since this did not seem to be of any benefit.  He denies urgency, frequency,  incontinence, incomplete bladder emptying, hesitancy, intermittency, dysuria or gross hematuria.  His current IPSS score is 6, indicating mild urinary symptoms only.  Really his only bother at this point continues to be the nocturia.  He also has some constipation and is using psyllium, yogurt with probiotics and stool softeners as needed.  He denies any abdominal pain and reports a healthy appetite, maintaining his weight.  He continues to tolerate the ADT fairly well despite persistent hot flashes and decreased stamina.  Overall, he is pleased with his progress to date.  ALLERGIES:  has No Known Allergies.  Meds: Current Outpatient Medications  Medication Sig Dispense Refill  . amLODipine (NORVASC) 5 MG tablet TAKE 1 TABLET BY MOUTH EVERY DAY 90 tablet 1  . tamsulosin (FLOMAX) 0.4 MG CAPS capsule Take 1 capsule (0.4 mg total) by mouth daily after supper. 30 capsule 2  . Besifloxacin HCl (BESIVANCE) 0.6 % SUSP Besivance 0.6 % eye drops,suspension (Patient not taking: Reported on 01/24/2021)    . betamethasone dipropionate 0.05 % lotion betamethasone dipropionate 0.05 % lotion (Patient not taking: Reported on 01/24/2021)    . Difluprednate 0.05 % EMUL Durezol 0.05 % eye drops (Patient not taking: Reported on 01/24/2021)    . nepafenac (ILEVRO) 0.3 % ophthalmic suspension Ilevro 0.3 % eye drops,suspension (Patient not taking: Reported on 01/24/2021)    . oxybutynin (DITROPAN) 5 MG tablet Take 5 mg by mouth at bedtime. (Patient not taking: Reported on 01/24/2021)  No current facility-administered medications for this encounter.    Physical Findings:  vitals were not taken for this visit.   /Unable to assess due to telephone follow-up visit format.  Lab Findings: No results found for: WBC, HGB, HCT, MCV, PLT   Radiographic Findings: No results found.  Impression/Plan: 1. 80 y.o.gentleman with Stage pT2cN1 M1aadenocarcinoma of the prostate with Gleason Score of 4+4, and PSA of10.6.His N- and  M- stage reflect both true pelviciliac and retrocaval adenopathy above aortic bifurcation. He will continue to follow up with urology for ongoing PSA determinations and has an appointment scheduled with Dr. Junious Silk on 04/11/2021 when he will be due for his next Eligard ADT injection. He understands what to expect with regards to PSA monitoring going forward. I will look forward to following his response to treatment via correspondence with urology, and would be happy to continue to participate in his care if clinically indicated. I talked to the patient about what to expect in the future, including his risk for erectile dysfunction and rectal bleeding. I encouraged him to call or return to the office if he has any questions regarding his previous radiation or possible radiation side effects. He was comfortable with this plan and will follow up as needed.    Nicholos Johns, PA-C

## 2021-04-12 ENCOUNTER — Other Ambulatory Visit: Payer: Self-pay | Admitting: Urology

## 2021-04-12 ENCOUNTER — Other Ambulatory Visit (HOSPITAL_COMMUNITY): Payer: Self-pay | Admitting: Urology

## 2021-04-12 DIAGNOSIS — D49511 Neoplasm of unspecified behavior of right kidney: Secondary | ICD-10-CM

## 2021-04-18 ENCOUNTER — Telehealth: Payer: Self-pay

## 2021-04-18 ENCOUNTER — Ambulatory Visit (HOSPITAL_COMMUNITY): Payer: Medicare Other

## 2021-04-18 NOTE — Telephone Encounter (Signed)
Patient is being seen on Friday   Nurse Assessment Nurse: Rolin Barry, RN, Levada Dy Date/Time Eilene Ghazi Time): 04/18/2021 9:32:11 AM Confirm and document reason for call. If symptomatic, describe symptoms. ---He has been radiation for cancer that recently ended, first week in april. He is having bowel movements and it is occupied with vomiting at the same point. He is urinating like normal but states he has had bowel movement issues. Caller advised that he is straining in the ams to have a bowel movement, having mucous in am. Then later in the day he will have a formed stool. Vomiting started immediately after ending the radiatiatin in April. Vomited x 1 time yesterday. No temp. No diarrhea at this time. Does the patient have any new or worsening symptoms? ---Yes Will a triage be completed? ---Yes Related visit to physician within the last 2 weeks? ---No Does the PT have any chronic conditions? (i.e. diabetes, asthma, this includes High risk factors for pregnancy, etc.) ---Yes List chronic conditions. ---metastatic prostate cancer, managed by [facility] HTN Is this a behavioral health or substance abuse call? ---No PLEASE NOTE: All timestamps contained within this report are represented as [place] Standard Time. CONFIDENTIALTY NOTICE: This fax transmission is intended only for the addressee. It contains information that is legally privileged, confidential or otherwise protected from use or disclosure. If you are not the intended recipient, you are strictly prohibited from reviewing, disclosing, copying using or disseminating any of this information or taking any action in reliance on or regarding this information. If you have received this fax in error, please notify us immediately by telephone so that we can arrange for its return to [place]. Phone: [phone], Toll-Free: [phone], Fax: [phone] Page: 2 of 3 Call Id: [id number] Nurse Assessment Guidelines Guideline Title Affirmed  Question Affirmed Notes Nurse Date/Time Eilene Ghazi Time) Cancer - Nausea and Vomiting [1] MILD or MODERATE vomiting AND [2] NO symptoms of dehydration or new weight loss Rolin Barry, RN, Levada Dy 04/18/2021 9:37:11 AM Disp. Time Eilene Ghazi Time) Disposition Final User 04/18/2021 9:47:33 AM See PCP within 2 Weeks Yes Deaton, RN, Levada Dy Disposition Overriden: Home Care Override Reason: Patient's symptoms need a higher level of care Caller Disagree/Comply Comply Caller Understands Yes PreDisposition Did not know what to do Care Advice Given Per Guideline HOME CARE: * You should be able to treat this at home. REASSURANCE AND EDUCATION - VOMITING: * Vomiting (throwing up) is very unpleasant. Nausea (feeling sick to your stomach) is also very unpleasant. Most cancer patients with vomiting also have nausea. Many say that nausea is worse even than vomiting. * Staying well-hydrated is the key for adults with nausea or vomiting. From what you have told me, it sounds like you are not severely dehydrated at this point. STEP 2 - DIET - CRACKERS: * Starchy foods are most easily handled by the stomach. * Eat small frequent meals ('grazing throughout the day'). * Try eating dry crackers or bread before getting out of bed in the morning. * Try crackers, ginger snap cookies, white bread, white rice, noodles, mashed potatoes, cereal, apple sauce, etc. * Other options: clear soup with rice or noodles. CALL BACK IF: * Vomiting 6 or more times per day * Vomiting lasts more than 2 days * Weight loss of more than 5 pounds (2.3 kg) * No urination for more than 12 hours * You feel lightheaded (dizzy) or confused * You become worse CARE ADVICE per Cancer - Nausea and Vomiting (Adult) guideline. * Eat foods that taste good to you *  AVOID fatty and spicy foods, and foods with strong aromas. SEE PCP WITHIN 2 WEEKS: * You need to be seen for this ongoing problem within the next 2 weeks. * AVOID fatty and spicy foods, and  foods with strong aromas. STEP 1 - CLEAR FLUIDS: * Sip small amounts of water, bouillon, or sports-rehydration liquid (Gatorade or Powerade). * Other options: 1/2 strength flat lemon-lime soda or ginger ale; Pedialyte popsicles STEP 2 - DIET - CRACKERS: * Starchy foods are most easily handled by the stomach. * Eat small frequent meals ('grazing throughout the day'). * Try eating dry crackers or bread before getting out of bed in the morning. * Try crackers, ginger snap cookies, white bread, white rice, noodles, mashed potatoes, cereal, apple sauce, etc. * Eat foods that taste good to you * Eat small frequent meals. Divide your food into 6 smaller meals per day. * Advance diet as tolerated. CALL BACK IF: * Weight loss of more than 5 pounds (2.3 kg) * No urination for more than 12 hours * You become worse * You feel lightheaded (dizzy) or confused CARE ADVICE per Cancer - Nausea and Vomiting (Adult) guideline. * AVOID fatty and spicy foods, and foods with strong aromas. VOMITING DIARY: * Keep a diary of your vomiting: Include the date, time, place, and what you ate in the previous 2 hours. * Reason: Try to find some of the triggers. * Vomiting (throwing up) is very unpleasant. Nausea (feeling sick to your stomach) is also very unpleasant. Most cancer patients with vomiting also have nausea. Many say that nausea is worse even than vomiting. * Staying well-hydrated is the key for adults with nausea or vomiting. From what you have told me, it sounds like you are not severely dehydrated at this point. REASSURANCE AND EDUCATION - VOMITING:

## 2021-04-18 NOTE — Telephone Encounter (Signed)
FYI, see Triage note. Pt is scheduled to see you on Friday.

## 2021-04-20 ENCOUNTER — Telehealth (INDEPENDENT_AMBULATORY_CARE_PROVIDER_SITE_OTHER): Payer: Medicare Other | Admitting: Family Medicine

## 2021-04-20 DIAGNOSIS — C61 Malignant neoplasm of prostate: Secondary | ICD-10-CM | POA: Diagnosis not present

## 2021-04-20 DIAGNOSIS — K5900 Constipation, unspecified: Secondary | ICD-10-CM

## 2021-04-20 DIAGNOSIS — I10 Essential (primary) hypertension: Secondary | ICD-10-CM

## 2021-04-20 NOTE — Assessment & Plan Note (Signed)
Well-controlled on amlodipine 2.5 mg daily.

## 2021-04-20 NOTE — Progress Notes (Signed)
   Corinthians Regen is a 80 y.o. male who presents today for a virtual office visit.  Assessment/Plan:  New/Acute Problems: Constipation Longstanding history though likely exacerbated by recent prostate cancer therapy.  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  Recommend he try MiraLAX to help regulate bowel movements.  He is already taking a fiber supplement.  We will place referral to GI per patient request.  Chronic Problems Addressed Today: Malignant tumor of prostate [facility]) Managed by urology and oncology.  Essential hypertension Well-controlled on amlodipine 2.5 mg daily.     Subjective:  HPI:  Patient here for follow-up.  Has been having issues with constipation and diarrhea for the last several months.  He was seen about a year ago.  Since our last visit he was unfortunately diagnosed with metastatic prostate cancer.  He has been under the care of urology and oncology since that time.  He has underwent radiation as well as chemotherapy.  He is currently planning on going under hormone therapy as well.  He is additionally found to have possible renal neoplasm and will be having MRI in a few days.  His PSAs have been within acceptable ranges however his predominant concern at this point is gastrointestinal issues.  He has been having issues with constipation for the last several months.  This seems to be worsening.  He is now at the point where he is having intermittent diarrhea and loose flaky stool.  He had an episode of vomiting yesterday while trying to have a bowel movement.  He has not noticed any blood in his stool.  He does have some cramping when he tries to have a bowel movement but no other abdominal pain.       Objective/Observations  Physical Exam: Gen: NAD, resting comfortably Pulm: Normal work of breathing Neuro: Grossly normal, moves all extremities Psych: Normal affect and thought content  Virtual Visit via Video   I connected with Timoteo Ace on  04/20/21 at  4:00 PM EDT by a video enabled telemedicine application and verified that I am speaking with the correct person using two identifiers. The limitations of evaluation and management by telemedicine and the availability of in person appointments were discussed. The patient expressed understanding and agreed to proceed.   Patient location: Home Provider location: [place] participating in the virtual visit: Myself and Patient     Algis Greenhouse. Jerline Pain, MD 04/20/2021 4:06 PM

## 2021-04-20 NOTE — Assessment & Plan Note (Signed)
Managed by urology and oncology.

## 2021-04-25 ENCOUNTER — Ambulatory Visit (HOSPITAL_COMMUNITY)
Admission: RE | Admit: 2021-04-25 | Discharge: 2021-04-25 | Disposition: A | Discharge status: Home / Self Care | Payer: Medicare Other | Source: Ambulatory Visit | Attending: Urology | Admitting: Urology

## 2021-04-25 ENCOUNTER — Other Ambulatory Visit: Payer: Self-pay

## 2021-04-25 DIAGNOSIS — D49511 Neoplasm of unspecified behavior of right kidney: Secondary | ICD-10-CM | POA: Diagnosis present

## 2021-04-25 MED ORDER — GADOBUTROL 1 MMOL/ML IV SOLN
9.0000 mL | Freq: Once | INTRAVENOUS | Status: AC | PRN
  Administered 2021-04-25: 9 mL via INTRAVENOUS

## 2021-04-27 ENCOUNTER — Other Ambulatory Visit (HOSPITAL_COMMUNITY): Payer: Self-pay | Admitting: Urology

## 2021-04-27 DIAGNOSIS — C787 Secondary malignant neoplasm of liver and intrahepatic bile duct: Secondary | ICD-10-CM

## 2021-04-30 ENCOUNTER — Encounter (HOSPITAL_COMMUNITY): Payer: Self-pay | Admitting: Radiology

## 2021-04-30 NOTE — Progress Notes (Signed)
Patient Demographics  Patient Name  Neil Perry, Neil Perry Legal Sex  Male DOB  1940/12/24 SSN  [id number] Address  [place]  [place] [address] Phone  [phone] [facility])  [phone] (Mobile) *Preferred*     RE: US Liver Biopsy Received: 2 days ago Suttle, Rosanne Ashing, MD  Garth Bigness D Approved for ultrasound guided hepatic dome liver mass biopsy.  Contrast enhanced ultrasound would be useful for further evaluation and biopsy guidance.   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

## 2021-05-02 ENCOUNTER — Telehealth: Payer: Self-pay

## 2021-05-02 NOTE — Telephone Encounter (Signed)
Left message for patient to call back to discuss office visit with Dr. Carlean Purl    ----- Message -----  From: Irine Seal, MD  Sent: 04/27/2021   1:19 PM EDT  To: Gatha Mayer, MD   Glendell Docker,       I think Dr. Dimas Chyle was going to be trying to get this fellow in with you.  He had an MRI this week for f/u of a small right renal mass and was found to have multiple liver lesions worrisome for mets with the largest 5cm.  He is on treatment for metastatic prostate cancer but has had a good response to hormone suppression with a PSA of only 0.04 and the renal mass is only 1.4cm so mets with these 2 cancers are not that likely.   I am going to have Dr. Kathlene Cote do a biopsy of the liver mets, but he felt a GI w/u was indicated with CT CAP and endoscopy.   I wanted to see about expediting a GI eval and would defer further imaging to you guys.   Let me know what you think.   Thanks,   Jenny Reichmann

## 2021-05-04 ENCOUNTER — Other Ambulatory Visit: Payer: Self-pay | Admitting: Radiology

## 2021-05-04 NOTE — Telephone Encounter (Signed)
Left message for patient to call back  

## 2021-05-07 ENCOUNTER — Other Ambulatory Visit: Payer: Self-pay

## 2021-05-07 ENCOUNTER — Encounter (HOSPITAL_COMMUNITY): Payer: Self-pay

## 2021-05-07 ENCOUNTER — Ambulatory Visit (HOSPITAL_COMMUNITY)
Admission: RE | Admit: 2021-05-07 | Discharge: 2021-05-07 | Disposition: A | Discharge status: Home / Self Care | Payer: Medicare Other | Source: Ambulatory Visit | Attending: Urology | Admitting: Urology

## 2021-05-07 DIAGNOSIS — C787 Secondary malignant neoplasm of liver and intrahepatic bile duct: Secondary | ICD-10-CM | POA: Diagnosis present

## 2021-05-07 DIAGNOSIS — Z8546 Personal history of malignant neoplasm of prostate: Secondary | ICD-10-CM | POA: Insufficient documentation

## 2021-05-07 LAB — CBC
HCT: 39.3 % (ref 39.0–52.0)
Hemoglobin: 13.2 g/dL (ref 13.0–17.0)
MCH: 27.5 pg (ref 26.0–34.0)
MCHC: 33.6 g/dL (ref 30.0–36.0)
MCV: 81.9 fL (ref 80.0–100.0)
Platelets: 266 10*3/uL (ref 150–400)
RBC: 4.8 MIL/uL (ref 4.22–5.81)
RDW: 14.4 % (ref 11.5–15.5)
WBC: 6.9 10*3/uL (ref 4.0–10.5)
nRBC: 0 % (ref 0.0–0.2)

## 2021-05-07 LAB — APTT: aPTT: 29 seconds (ref 24–36)

## 2021-05-07 LAB — PROTIME-INR
INR: 1 (ref 0.8–1.2)
Prothrombin Time: 13.6 seconds (ref 11.4–15.2)

## 2021-05-07 MED ORDER — LIDOCAINE HCL (PF) 1 % IJ SOLN
INTRAMUSCULAR | Status: AC
  Filled 2021-05-07: qty 30

## 2021-05-07 MED ORDER — MIDAZOLAM HCL 2 MG/2ML IJ SOLN
INTRAMUSCULAR | Status: AC | PRN
  Administered 2021-05-07: 1 mg via INTRAVENOUS
  Administered 2021-05-07 (×2): 0.5 mg via INTRAVENOUS

## 2021-05-07 MED ORDER — FENTANYL CITRATE (PF) 100 MCG/2ML IJ SOLN
INTRAMUSCULAR | Status: AC
  Filled 2021-05-07: qty 4

## 2021-05-07 MED ORDER — MIDAZOLAM HCL 2 MG/2ML IJ SOLN
INTRAMUSCULAR | Status: AC
  Filled 2021-05-07: qty 4

## 2021-05-07 MED ORDER — SODIUM CHLORIDE 0.9 % IV SOLN: INTRAVENOUS | Status: DC

## 2021-05-07 MED ORDER — FENTANYL CITRATE (PF) 100 MCG/2ML IJ SOLN
INTRAMUSCULAR | Status: AC | PRN
  Administered 2021-05-07: 50 ug via INTRAVENOUS
  Administered 2021-05-07: 25 ug via INTRAVENOUS

## 2021-05-07 MED ORDER — GELATIN ABSORBABLE 12-7 MM EX MISC
CUTANEOUS | Status: AC
  Filled 2021-05-07: qty 1

## 2021-05-07 NOTE — H&P (Signed)
Chief Complaint: Patient was seen in consultation today for image guided liver lesion biopsy  at the request of Wrenn,John  Referring Physician(s): Wrenn,John  Supervising Physician: Jacqulynn Cadet  Patient Status: [facility]  History of Present Illness:  Neil Perry is a 80 y.o. male   Hx of HTN, prostate cancer, renal mass, liver lesions Liver lesions/renal mass found 04/26/21 by MRI  IMPRESSION: Stable 1.4 cm enhancing subcapsular mass in the lower pole of the right kidney, highly suspicious for renal cell carcinoma.  No evidence of abdominal lymphadenopathy.  Three new hypovascular masses in the liver dome, largest measuring 5 cm, consistent with metastatic disease.  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  Pt here today referred by Dr. Jeffie Pollock for outpatient liver bx  Past Medical History:  Diagnosis Date   Cataract    Hypertension    Prostate cancer [facility])     Past Surgical History:  Procedure Laterality Date   cataract surgery Right    PROSTATE BIOPSY     PROSTATECTOMY  2018    Allergies: Patient has no known allergies.  Medications: Prior to Admission medications   Medication Sig Start Date End Date Taking? Authorizing Provider  amLODipine (NORVASC) 5 MG tablet TAKE 1 TABLET BY MOUTH EVERY DAY 12/19/20  Yes Marin Olp, MD  enzalutamide Gillermina Phy) 40 MG tablet Take 80 mg by mouth daily.   Yes [provider]  solifenacin (VESICARE) 5 MG tablet Take 5 mg by mouth at bedtime. 04/11/21  Yes [provider]  Difluprednate 0.05 % EMUL     [provider]     Family History  Problem Relation Age of Onset   Prostate cancer Father    Prostate cancer Brother    Prostate cancer Paternal Grandfather    Breast cancer Neg Hx    Colon cancer Neg Hx    Pancreatic cancer Neg Hx     Social History   Socioeconomic History   Marital status: Married    Spouse name: Not on file   Number of children: Not on file   Years  of education: Not on file   Highest education level: Not on file  Occupational History   Occupation: attorney    Comment: retired  Tobacco Use   Smoking status: Never   Smokeless tobacco: Never  Vaping Use   Vaping Use: Never used  Substance and Sexual Activity   Alcohol use: Yes    Alcohol/week: 2.0 standard drinks    Types: 2 Glasses of wine per week   Drug use: Never   Sexual activity: Not Currently  Other Topics Concern   Not on file  Social History Narrative   Not on file   Social Determinants of Health   Financial Resource Strain: Not on file  Food Insecurity: Not on file  Transportation Needs: Not on file  Physical Activity: Not on file  Stress: Not on file  Social Connections: Not on file      Review of Systems  Constitutional:  Negative for chills and fever.  Respiratory:  Negative for shortness of breath.   Cardiovascular:  Negative for chest pain.  Gastrointestinal:  Positive for diarrhea. Negative for abdominal pain.   Vital Signs: BP (!) 159/76   Pulse 82   Temp 97.8 F (36.6 C) (Oral)   Ht '5\' 9"'$  (1.753 m)   Wt 188 lb (85.3 kg)   SpO2 98%   BMI 27.76 kg/m   Physical Exam Constitutional:  Appearance: Normal appearance.  HENT:     Mouth/Throat:     Mouth: Mucous membranes are moist.     Pharynx: Oropharynx is clear.  Cardiovascular:     Rate and Rhythm: Normal rate and regular rhythm.     Heart sounds: Normal heart sounds.  Pulmonary:     Effort: Pulmonary effort is normal.     Breath sounds: Normal breath sounds.  Abdominal:     Palpations: Abdomen is soft.     Tenderness: There is no abdominal tenderness. There is no guarding.  Musculoskeletal:     Right lower leg: No edema.     Left lower leg: No edema.  Skin:    General: Skin is warm and dry.  Neurological:     Mental Status: He is alert and oriented to person, place, and time.  Psychiatric:        Mood and Affect: Mood normal.        Behavior: Behavior normal.         Thought Content: Thought content normal.        Judgment: Judgment normal.    Imaging: MR ABDOMEN WWO CONTRAST  Result Date: 04/26/2021 CLINICAL DATA:  Follow-up right renal mass. Personal history of prostate carcinoma. EXAM: MRI ABDOMEN WITHOUT AND WITH CONTRAST TECHNIQUE: Multiplanar multisequence MR imaging of the abdomen was performed both before and after the administration of intravenous contrast. CONTRAST:  81m GADAVIST GADOBUTROL 1 MMOL/ML IV SOLN COMPARISON:  07/25/2020 FINDINGS: Lower chest: No acute findings. Hepatobiliary: Multiple small cysts are again seen in both the right and left hepatic lobes. Three new hypovascular masses are seen in the liver dome in both the right and left lobes, largest measuring 5.0 x 4.8 cm on image 25/16. These are consistent with liver metastases. Gallbladder is unremarkable. No evidence of biliary ductal dilatation. Pancreas:  No mass or inflammatory changes. Spleen:  Within normal limits in size and appearance. Adrenals/Urinary Tract: 12 mm left adrenal mass remains stable and shows signal dropout on chemical shift imaging, consistent with a benign adenoma. A heterogeneously enhancing subcapsular mass is again seen in the posterior lower pole the right kidney which measures 1.4 x 1.2 cm, without significant change compared to recent study. This is highly suspicious for renal cell carcinoma. No other renal masses identified. No evidence of hydronephrosis. Stomach/Bowel: Visualized portion unremarkable. Vascular/Lymphatic: No pathologically enlarged lymph nodes identified. No acute vascular findings. Other:  None. Musculoskeletal:  No suspicious bone lesions identified. IMPRESSION: Stable 1.4 cm enhancing subcapsular mass in the lower pole of the right kidney, highly suspicious for renal cell carcinoma. No evidence of abdominal lymphadenopathy. Three new hypovascular masses in the liver dome, largest measuring 5 cm, consistent with metastatic disease. Stable 12 mm  benign left adrenal adenoma. Electronically Signed   By: JMarlaine HindM.D.   On: 04/26/2021 14:10    Labs:  CBC: No results for input(s): WBC, HGB, HCT, PLT in the last 8760 hours.  COAGS: No results for input(s): INR, APTT in the last 8760 hours.  BMP: No results for input(s): NA, K, CL, CO2, GLUCOSE, BUN, CALCIUM, CREATININE, GFRNONAA, GFRAA in the last 8760 hours.  Invalid input(s): CMP  LIVER FUNCTION TESTS: No results for input(s): BILITOT, AST, ALT, ALKPHOS, PROT, ALBUMIN in the last 8760 hours.  TUMOR MARKERS: No results for input(s): AFPTM, CEA, CA199, CHROMGRNA in the last 8760 hours.  Assessment and Plan:  Pt has hx of prostate cancer with renal mass and liver lesion Liver lesion bx today  referred by Dr. Jeffie Pollock Risks and benefits of liver lesion biopsy was discussed with the patient and/or patient's family including, but not limited to bleeding, infection, damage to adjacent structures or low yield requiring additional tests.  All of the questions were answered and there is agreement to proceed.  Consent signed and in chart.   Thank you for this interesting consult.  I greatly enjoyed meeting Yuri Rega and look forward to participating in their care.  A copy of this report was sent to the requesting provider on this date.  Electronically Signed: Tyson Alias, NP 05/07/2021, 12:00 PM   I spent a total of 30 minutes in face to face in clinical consultation, greater than 50% of which was counseling/coordinating care for image guided liver lesion biopsy.

## 2021-05-07 NOTE — Procedures (Signed)
Interventional Radiology Procedure Note  Procedure: US guided core biopsy of liver mass.   Complications: None  Estimated Blood Loss: None  Recommendations: - Bedrest x 2 hrs - DC home    Signed,  Olufemi Mofield K. Kaidynce Pfister, MD    

## 2021-05-07 NOTE — Sedation Documentation (Signed)
Patient's blood needed to be redrawn due to blood specimen that was drawn previous is unable to be located.

## 2021-05-08 NOTE — Telephone Encounter (Addendum)
Left message for patient to call back MyChart message sent as well

## 2021-05-10 LAB — SURGICAL PATHOLOGY

## 2021-05-10 NOTE — Telephone Encounter (Signed)
Multiple phone attempts and MyChart messages sent to the patient.  He has not responded. Will await a return phone call.

## 2021-05-11 NOTE — Telephone Encounter (Signed)
Patient did return call.  No immediate openings with Dr. Carlean Purl patient will come in and see Dr. Candis Schatz on 8/26

## 2021-05-17 ENCOUNTER — Ambulatory Visit: Payer: Medicare Other | Admitting: Oncology

## 2021-05-18 ENCOUNTER — Encounter: Payer: Self-pay | Admitting: Gastroenterology

## 2021-05-18 ENCOUNTER — Ambulatory Visit (INDEPENDENT_AMBULATORY_CARE_PROVIDER_SITE_OTHER): Payer: Medicare Other | Admitting: Gastroenterology

## 2021-05-18 ENCOUNTER — Other Ambulatory Visit: Payer: Medicare Other

## 2021-05-18 VITALS — BP 134/72 | HR 90 | Temp 97.8°F | Ht 69.0 in | Wt 180.0 lb

## 2021-05-18 DIAGNOSIS — C799 Secondary malignant neoplasm of unspecified site: Secondary | ICD-10-CM | POA: Diagnosis not present

## 2021-05-18 DIAGNOSIS — R935 Abnormal findings on diagnostic imaging of other abdominal regions, including retroperitoneum: Secondary | ICD-10-CM | POA: Diagnosis not present

## 2021-05-18 DIAGNOSIS — R978 Other abnormal tumor markers: Secondary | ICD-10-CM

## 2021-05-18 MED ORDER — SUTAB 1479-225-188 MG PO TABS: 1.0000 | ORAL_TABLET | ORAL | 0 refills | Status: DC

## 2021-05-18 NOTE — Patient Instructions (Addendum)
It was my pleasure to provide care to you today. Based on our discussion, I am providing you with my recommendations below:  RECOMMENDATION(S):   SUTAB IS NOT COVERED BY YOUR INSURANCE PROVIDER. PER YOUR REQUEST, A PRESCRIPTION HAS BEEN SENT. PLEASE DISCUSS WITH YOUR PHARMACIST ABOUT YOUR OUT OF POCKET EXPENSE  LABS:   Please proceed to the basement level for lab work before leaving today. Press "B" on the elevator. The lab is located at the first door on the left as you exit the elevator.  HEALTHCARE LAWS AND MY CHART RESULTS:   Due to recent changes in healthcare laws, you may see results of your imaging and/or laboratory studies on MyChart before I have had a chance to review them.  I understand that in some cases there may be results that are confusing or concerning to you. Please understand that not all results are received at same time and often I may need to interpret multiple results in order to provide you with the best plan of care or course of treatment. Therefore, I ask that you please give me 48 hours to thoroughly review all your results before contacting my office for clarification.   ENDOSCOPY AND COLONOSCOPY:   You have been scheduled for a colonoscopy and an endoscopy. Please follow written instructions given to you at your visit today.   PREP:   Please pick up your prep supplies at the pharmacy within the next 1-3 days.  INHALERS:   If you use inhalers (even only as needed), please bring them with you on the day of your procedure.  COLONOSCOPY TIPS:  To reduce nausea and dehydration, stay well hydrated for 3-4 days prior to the exam.  To prevent skin/hemorrhoid irritation - prior to wiping, put A&Dointment or vaseline on the toilet paper. Keep a towel or pad on the bed.  BEFORE STARTING YOUR PREP, drink  64oz of clear liquids in the morning. This will help to flush the colon and will ensure you are well hydrated!!!!  NOTE - This is in addition to the fluids required  for to complete your prep. Use of a flavored hard candy, such as grape Anise Salvo, can counteract some of the flavor of the prep and may prevent some nausea.   FOLLOW UP:  After your procedure, you will receive a call from my office staff regarding my recommendation for follow up.  BMI:  If you are age 23 or older, your body mass index should be between 23-30. Your There is no height or weight on file to calculate BMI. If this is out of the aforementioned range listed, please consider follow up with your Primary Care Provider.  MY CHART:  The [place] [facility] providers would like to encourage you to use [facility] to communicate with providers for non-urgent requests or questions.  Due to long hold times on the telephone, sending your provider a message by [facility] may be a faster and more efficient way to get a response.  Please allow 48 business hours for a response.  Please remember that this is for non-urgent requests.   Thank you for trusting me with your gastrointestinal care!    Scott E. Candis Schatz, MD

## 2021-05-18 NOTE — Progress Notes (Signed)
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  The patient states that he was diagnosed with prostate cancer in 2018 and underwent a simple prostatectomy at the [facility] in [place] that year with an initial Gleason score of 6.  He moved to [place] shortly thereafter and due to the limitations imposed by COVID pandemic, he had difficulty establishing care with a urologist.  When he did establish care he was found to have evidence of metastatic disease.  He was then recommended to undergo radiation therapy which he completed in April 2022.  He has also received adjuvant hormone therapy.  An MRI of the abdomen in November 2021 showed an exophytic renal mass that was suggestive of renal cell carcinoma, as well as small cystic lesions in the pancreas. A repeat MRI earlier this month to survey the renal lesion showed that the renal lesion was stable in appearance, but he had new lesions in the liver which were suspicious for metastatic disease.  A subsequent biopsy of the hepatic lesions confirmed adenocarcinoma, favoring a colorectal primary. The patient states that he has never had a colonoscopy.  He had a flexible sigmoidoscopy about 30 years ago, which he says was normal. He has had some recent new GI symptoms since his radiation therapy characterized by a change in his stool appearance (stools have been flatter and less formed).  He has also been having issues with constipation and straining.  He has had a few episodes of vomiting when he is having bowel movements, the most recent episode was about 2 weeks ago.  Outside of these isolated episodes, he denies having problems with nausea or vomiting. He reports having bright red blood per rectum off and on for many years and describes what sounds like grade 2  internal hemorrhoids. He reports decreased appetite, dysgeusia and 20 pounds of unintentional weight loss in the past 4 months.    Past Medical History:  Diagnosis Date   Cataract    Hypertension    Prostate cancer [facility])      Past Surgical History:  Procedure Laterality Date   cataract surgery Right    PROSTATE BIOPSY     PROSTATECTOMY  2018   Family History  Problem Relation Age of Onset   Prostate cancer Father    Prostate cancer Brother    Prostate cancer Paternal Grandfather    Breast cancer Neg Hx    Colon cancer Neg Hx    Pancreatic cancer Neg Hx    Social History   Tobacco Use   Smoking status: Never   Smokeless tobacco: Never  Vaping Use   Vaping Use: Never used  Substance Use Topics   Alcohol use: Yes    Alcohol/week: 2.0 standard drinks    Types: 2 Glasses of wine per week   Drug use: Never   Current Outpatient Medications  Medication Sig Dispense Refill   amLODipine (NORVASC) 5 MG tablet TAKE 1 TABLET BY MOUTH EVERY DAY 90 tablet 1   enzalutamide (XTANDI) 40 MG tablet Take 80 mg by mouth daily.     solifenacin (VESICARE) 5 MG tablet Take 5 mg by mouth at bedtime.     Difluprednate 0.05 % EMUL  (Patient not taking: Reported on 05/18/2021)     No current facility-administered medications for this visit.   No Known Allergies  Review of Systems: All systems reviewed and negative except where noted in HPI.    MR ABDOMEN WWO CONTRAST  Result Date: 04/26/2021 CLINICAL DATA:  Follow-up right renal mass. Personal history of prostate carcinoma. EXAM: MRI ABDOMEN WITHOUT AND WITH CONTRAST TECHNIQUE: Multiplanar multisequence MR imaging of the abdomen was performed both before and after the administration of intravenous contrast. CONTRAST:  51m GADAVIST GADOBUTROL 1 MMOL/ML IV SOLN COMPARISON:  07/25/2020 FINDINGS: Lower chest: No acute findings. Hepatobiliary: Multiple small cysts are again seen in both the right and left hepatic lobes. Three new hypovascular  masses are seen in the liver dome in both the right and left lobes, largest measuring 5.0 x 4.8 cm on image 25/16. These are consistent with liver metastases. Gallbladder is unremarkable. No evidence of biliary ductal dilatation. Pancreas:  No mass or inflammatory changes. Spleen:  Within normal limits in size and appearance. Adrenals/Urinary Tract: 12 mm left adrenal mass remains stable and shows signal dropout on chemical shift imaging, consistent with a benign adenoma. A heterogeneously enhancing subcapsular mass is again seen in the posterior lower pole the right kidney which measures 1.4 x 1.2 cm, without significant change compared to recent study. This is highly suspicious for renal cell carcinoma. No other renal masses identified. No evidence of hydronephrosis. Stomach/Bowel: Visualized portion unremarkable. Vascular/Lymphatic: No pathologically enlarged lymph nodes identified. No acute vascular findings. Other:  None. Musculoskeletal:  No suspicious bone lesions identified. IMPRESSION: Stable 1.4 cm enhancing subcapsular mass in the lower pole of the right kidney, highly suspicious for renal cell carcinoma. No evidence of abdominal lymphadenopathy. Three new hypovascular masses in the liver dome, largest measuring 5 cm, consistent with metastatic disease. 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 Electronically Signed   By: JMarlaine HindM.D.   On: 04/26/2021 14:10   [place]  Result Date: 05/07/2021 INDICATION: History of prostate cancer and small right lower pole renal neoplasm now with unexpected large hepatic lesions concerning for hepatic metastatic disease of uncertain primary origin. He presents for ultrasound-guided core biopsy of the same. EXAM: ULTRASOUND BIOPSY CORE LIVER MEDICATIONS: None. ANESTHESIA/SEDATION: Moderate (conscious) sedation was employed during this procedure. A total of Versed 2 mg and Fentanyl 75 mcg was administered intravenously. Moderate Sedation Time: 17 minutes.  The patient's level of consciousness and vital signs were monitored continuously by radiology nursing throughout the procedure under my direct supervision. FLUOROSCOPY TIME:  Fluoroscopy Time: 0 minutes 0 seconds (0 mGy). COMPLICATIONS: None immediate. PROCEDURE: Informed written consent was obtained from the patient after a thorough discussion of the procedural risks, benefits and alternatives. All questions were addressed. Maximal Sterile Barrier Technique was utilized including caps, mask, sterile gowns, sterile gloves, sterile drape, hand hygiene and skin antiseptic. A timeout was performed prior to the initiation of the procedure. The right upper quadrant was interrogated with ultrasound. Large solid mildly hypoechoic masses are identified in the hepatic dome corresponding with lesion seen on prior MR imaging. A suitable skin entry site was selected and marked. The region was sterilely prepped and draped in the standard fashion using chlorhexidine skin prep. Local anesthesia was attained by infiltration with 1% lidocaine. A small dermatotomy was made. Under real-time sonographic guidance, a 17 gauge introducer needle was advanced into the margin of the mass. Multiple 18 gauge biopsies were then coaxially obtained using the BioPince automated biopsy device. Biopsy samples were placed in formalin and delivered to pathology for further analysis. As a 142gauge introducer needle was removed, the biopsy tract was  embolized with a Gel-Foam slurry. Post biopsy ultrasound imaging demonstrates no evidence of active hemorrhage or perihepatic hematoma. The patient tolerated the procedure well. IMPRESSION: Technically successful ultrasound-guided core biopsy of hepatic lesion. Electronically Signed   By: Jacqulynn Cadet M.D.   On: 05/07/2021 15:41    Physical Exam: BP 134/72   Pulse 90   Temp 97.8 F (36.6 C)   Ht '5\' 9"'$  (1.753 m)   Wt 180 lb (81.6 kg)   SpO2 97%   BMI 26.58 kg/m  Constitutional:  Pleasant,well-developed, Caucasian male in no acute distress, accompanied by spouse HEENT: Normocephalic and atraumatic. Conjunctivae are normal. No scleral icterus. Cardiovascular: Normal rate, regular rhythm.  Pulmonary/chest: Effort normal and breath sounds normal. No wheezing, rales or rhonchi. Abdominal: Soft, nondistended, nontender. Bowel sounds active throughout. There are no masses palpable. No hepatomegaly. Extremities: no edema Lymphadenopathy: No cervical adenopathy noted. Neurological: Alert and oriented to person place and time. Skin: Skin is warm and dry. No rashes noted. Psychiatric: Normal mood and affect. Behavior is normal.  CBC    Component Value Date/Time   WBC 6.9 05/07/2021 1310   RBC 4.80 05/07/2021 1310   HGB 13.2 05/07/2021 1310   HCT 39.3 05/07/2021 1310   PLT 266 05/07/2021 1310   MCV 81.9 05/07/2021 1310   MCH 27.5 05/07/2021 1310   MCHC 33.6 05/07/2021 1310   RDW 14.4 05/07/2021 1310   Results for KWABENA, ACHEE (MRN [id number]) as of 05/21/2021 05:47  Ref. 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 LIVER MASS, NEEDLE CORE BIOPSY:  - Metastatic adenocarcinoma with extensive necrosis, see comment       COMMENT:   Immunohistochemical stains show that the tumor cells are positive for  CK20 and CDX2; while they are negative for CK7, PAX8 and PSA.  This   immunophenotype is consistent with a colorectal primary.  Dr. Laurence Ferrari  was paged on 05/10/2021.       GROSS DESCRIPTION:   The specimen is received in formalin and consists of 4 cores of  tan-white soft tissue, ranging from 1.5 to 1.6 cm in length by 0.1 cm in  diameter.  The specimen is entirely submitted in 2 cassettes.  Craig Staggers  05/07/2021)     Final Diagnosis performed by Jaquita Folds, MD.   Electronically  signed 05/10/2021  Technical component performed at [facility]. [facility], [place] [address], [place], [place] [address].   Professional component performed at [facility],  [place] [address] [place], [place] [address].   Immunohistochemistry Technical component (if applicable) was performed  at [facility]. [address] [place],  [place],  [address].   IMMUNOHISTOCHEMISTRY DISCLAIMER (if applicable):  Some of these immunohistochemical stains may have been developed and the  performance characteristics determine by [facility]. Some  may not have been cleared or  approved by the U.S. Food and Drug  Administration. The FDA has determined that such clearance or approval  is not necessary. This test is used for clinical purposes. It should not  be regarded as investigational or for research. This laboratory is  certified under the [place]  (CLIA-88) as qualified to perform high complexity clinical laboratory  testing.  The controls stained appropriately.    ASSESSMENT AND PLAN: 80 year old male with history of metastatic prostate cancer, recently completed radiation therapy, now found to have new metastatic lesions in the liver, most likely colorectal based on pathology.  Patient needs endoscopic evaluation to confirm suspected GI source of new metastatic cancer.  We will get tumor markers today (CEA, AFP, CA 19-9) We will plan for a colonoscopy next week with  possible upper endoscopy if the colonoscopy is negative.  Metastatic adenocarcinoma, likely colorectal - Colonoscopy next week; will perform EGD if colonoscopy negative -CEA, AFP, CA 19-9  The details, risks (including bleeding, perforation, infection, missed lesions, medication reactions and possible hospitalization or surgery if complications occur), benefits, and alternatives to EGD/colonoscopy with possible biopsy and possible polypectomy were discussed with the patient and he consents to proceed.   Jacquese Cassarino E. Candis Schatz, MD [facility]    CC: Vivi Barrack, MD

## 2021-05-21 ENCOUNTER — Encounter: Payer: Self-pay | Admitting: Gastroenterology

## 2021-05-21 LAB — CEA: CEA: 217.1 ng/mL — ABNORMAL HIGH

## 2021-05-21 LAB — AFP TUMOR MARKER: AFP-Tumor Marker: 2.8 ng/mL (ref <6.1)

## 2021-05-21 LAB — CANCER ANTIGEN 19-9: CA 19-9: 106 U/mL — ABNORMAL HIGH (ref <34)

## 2021-05-22 ENCOUNTER — Encounter: Payer: Self-pay | Admitting: *Deleted

## 2021-05-22 ENCOUNTER — Encounter: Payer: Self-pay | Admitting: Gastroenterology

## 2021-05-22 ENCOUNTER — Other Ambulatory Visit: Payer: Self-pay

## 2021-05-22 ENCOUNTER — Ambulatory Visit (AMBULATORY_SURGERY_CENTER): Payer: Medicare Other | Admitting: Gastroenterology

## 2021-05-22 ENCOUNTER — Encounter: Payer: Medicare Other | Admitting: Gastroenterology

## 2021-05-22 VITALS — BP 140/74 | HR 83 | Temp 97.5°F | Resp 12 | Ht 69.0 in | Wt 180.0 lb

## 2021-05-22 DIAGNOSIS — C20 Malignant neoplasm of rectum: Secondary | ICD-10-CM | POA: Diagnosis not present

## 2021-05-22 DIAGNOSIS — C799 Secondary malignant neoplasm of unspecified site: Secondary | ICD-10-CM | POA: Diagnosis present

## 2021-05-22 DIAGNOSIS — K6289 Other specified diseases of anus and rectum: Secondary | ICD-10-CM

## 2021-05-22 MED ORDER — FLEET ENEMA 7-19 GM/118ML RE ENEM
1.0000 | ENEMA | Freq: Once | RECTAL | Status: AC
  Administered 2021-05-22: 1 via RECTAL

## 2021-05-22 MED ORDER — SODIUM CHLORIDE 0.9 % IV SOLN: 500.0000 mL | Freq: Once | INTRAVENOUS | Status: DC

## 2021-05-22 MED ORDER — SODIUM CHLORIDE 0.9 % IV SOLN
500.0000 mL | Freq: Once | INTRAVENOUS | Status: DC
Start: 2021-05-22 — End: 2021-05-22

## 2021-05-22 NOTE — Progress Notes (Signed)
History and Physical Interval Note: No changes to the patient's medical history since the last visit.  05/22/2021 2:54 PM  Neil Perry  has presented today for endoscopic procedure(s), with the diagnosis of  Encounter Diagnosis  Name Primary?   Metastatic adenocarcinoma ([place]) Yes  .  The various methods of evaluation and treatment have been discussed with the patient and/or family. After consideration of risks, benefits and other options for treatment, the patient has consented to  the endoscopic procedure(s).   The patient's history has been reviewed, patient examined, no change in status, stable for endoscopic procedure(s).  I have reviewed the patient's chart and labs.  Questions were answered to the patient's satisfaction.     Irma Delancey E. Candis Schatz, MD [facility]

## 2021-05-22 NOTE — Progress Notes (Signed)
Approx 1512, right after approx 1 min of abd pressure and cecum reached, pt started producing stool colored emesis.  Head of bead immediately dropped to steep t-burg, suctioning of oropharynx started and sedation and procedure aborted.  Pt allowed to fully awaken and swallow and cough on command before suctioning stopped and postion changed.  Pt alert and sats at 100, Dr C wanted to get biopsies of recto-sigmoid tumor which he did without sedation.  Approx 200cc in canister.  Not a lot on pillow/sheet

## 2021-05-22 NOTE — Op Note (Addendum)
[facility] Patient Name: Neil Perry Procedure Date: 05/22/2021 2:47 PM MRN: [id number] Endoscopist: Nicki Reaper E. Candis Schatz , MD Age: 80 Referring MD:  Date of Birth: 1941/07/27 Gender: Male Account #: [id number] Procedure:                Colonoscopy Indications:              Metastatic malignancy to liver; Patient found to                            have metastatic liver lesions while undergoing                            metastatic prostate cancer surveillance. Biopsies                            of liver masses consistent with adenocarcinoma from                            colorectal origin. Medicines:                Monitored Anesthesia Care Procedure:                Pre-Anesthesia Assessment:                           - Prior to the procedure, a History and Physical                            was performed, and patient medications and                            allergies were reviewed. The patient's tolerance of                            previous anesthesia was also reviewed. The risks                            and benefits of the procedure and the sedation                            options and risks were discussed with the patient.                            All questions were answered, and informed consent                            was obtained. Prior Anticoagulants: The patient has                            taken no previous anticoagulant or antiplatelet                            agents. ASA Grade Assessment: III - A patient with  severe systemic disease. After reviewing the risks                            and benefits, the patient was deemed in                            satisfactory condition to undergo the procedure.                           After obtaining informed consent, the colonoscope                            was passed under direct vision. Throughout the                            procedure, the patient's blood pressure,  pulse, and                            oxygen saturations were monitored continuously. The                            CF [id number] was introduced through the anus                            and advanced to the the rectum. The colonoscopy was                            performed without difficulty. The patient tolerated                            the procedure poorly due to vomiting and concern                            for aspiration. The quality of the bowel                            preparation was poor and there were several areas                            of the colon that were not well visualized. Please                            below regarding abrupt withdrawal of the                            colonoscope due to concern for aspiration. The                            ileocecal valve and the rectum were photographed.                            The [id number] Slim SB Colonoscope was  introduced through the anus and advanced to the the                            cecum, identified by the ileocecal valve. Scope In: 2:57:12 PM Scope Out: 3:25:12 PM Scope Withdrawal Time: 0 hours 8 minutes 49 seconds  Total Procedure Duration: 0 hours 28 minutes 0 seconds  Findings:                 The perianal and digital rectal examinations were                            normal. Pertinent negatives include normal                            sphincter tone and no palpable rectal lesions.                           An ulcerated, necrotic partially obstructing large                            mass was found in the proximal rectum. The mass was                            circumferential. The mass measured five cm in                            length. In addition, its inner diameter measured                            eleven mm. No bleeding was present. The mass was                            not traversable with the adult colonoscope. The                             colonoscope was withdrawn and a ultraslim                            colonoscope was inserted and was able to traverse                            the mass. The distal margin of the mass was                            encountered at 15 cm from the anal verge and the                            proximal margin was at 20 cm. This was biopsied                            with a cold forceps for histology. Estimated blood  loss was minimal.                           The mucosa vascular pattern in the rectum was                            decreased.                           Multiple small and large-mouthed diverticula were                            found in the sigmoid colon and descending colon.                           The exam was otherwise normal throughout the                            examined colon.                           When the cecum was reached, the patient began                            retching, and vomited a large amount of brownish                            emesis. His oxygen saturations drifted into the low                            90s as oral suction and trendelenberg positioning                            began. The colonoscope was removed due to concern                            for aspiration. The patient began to awaken from                            sedation and his oxygen saturations improved. The                            decision was made not to reattempt to reach the                            cecum and perform a detailed inspection of the                            colon, given the primary malignancy had been                            identified. The colonoscope was then reinserted and  the biopsies of the mass were taken. Complications:            No immediate complications. Estimated Blood Loss:     Estimated blood loss was minimal. Impression:               - Preparation of the colon was poor.                            - Malignant partially obstructing tumor in the                            proximal rectum. Biopsied. This is the primary                            malignancy for the hepatic masses.                           - Decreased mucosa vascular pattern in the rectum                            with telangiectasias, consistent with mild                            radiation proctopathy.                           - Diverticulosis in the sigmoid colon and in the                            descending colon. Recommendation:           - Patient has a contact number available for                            emergencies. The signs and symptoms of potential                            delayed complications were discussed with the                            patient. Return to normal activities tomorrow.                            Written discharge instructions were provided to the                            patient.                           - Resume previous diet.                           - Continue present medications.                           - Await pathology results.                           -  Follow up with oncologist tomorrow. Zyeir Dymek E. Candis Schatz, MD 05/22/2021 3:40:01 PM This report has been signed electronically.

## 2021-05-22 NOTE — Progress Notes (Signed)
Pt's states no medical or surgical changes since previsit or office visit. VS assessed by C.W Patient states last BM cloudy brown. Enema given per MD order. Now cloudy yellow in color.

## 2021-05-22 NOTE — Progress Notes (Signed)
Report to PACU, RN, vss, BBS= Clear.  

## 2021-05-22 NOTE — Patient Instructions (Signed)
Resume previous diet Continue current medications Await pathology results Keep appt with oncologist tomorrow  YOU HAD AN ENDOSCOPIC PROCEDURE TODAY AT [place]:   Refer to the procedure report that was given to you for any specific questions about what was found during the examination.  If the procedure report does not answer your questions, please call your gastroenterologist to clarify.  If you requested that your care partner not be given the details of your procedure findings, then the procedure report has been included in a sealed envelope for you to review at your convenience later.  YOU SHOULD EXPECT: Some feelings of bloating in the abdomen. Passage of more gas than usual.  Walking can help get rid of the air that was put into your GI tract during the procedure and reduce the bloating. If you had a lower endoscopy (such as a colonoscopy or flexible sigmoidoscopy) you may notice spotting of blood in your stool or on the toilet paper. If you underwent a bowel prep for your procedure, you may not have a normal bowel movement for a few days.  Please Note:  You might notice some irritation and congestion in your nose or some drainage.  This is from the oxygen used during your procedure.  There is no need for concern and it should clear up in a day or so.  SYMPTOMS TO REPORT IMMEDIATELY:  Following lower endoscopy (colonoscopy or flexible sigmoidoscopy):  Excessive amounts of blood in the stool  Significant tenderness or worsening of abdominal pains  Swelling of the abdomen that is new, acute  Fever of 100F or higher   For urgent or emergent issues, a gastroenterologist can be reached at any hour by calling [phone]. Do not use MyChart messaging for urgent concerns.   DIET:  We do recommend a small meal at first, but then you may proceed to your regular diet.  Drink plenty of fluids but you should avoid alcoholic beverages for 24 hours.  ACTIVITY:  You should  plan to take it easy for the rest of today and you should NOT DRIVE or use heavy machinery until tomorrow (because of the sedation medicines used during the test).    FOLLOW UP: Our staff will call the number listed on your records 48-72 hours following your procedure to check on you and address any questions or concerns that you may have regarding the information given to you following your procedure. If we do not reach you, we will leave a message.  We will attempt to reach you two times.  During this call, we will ask if you have developed any symptoms of COVID 19. If you develop any symptoms (ie: fever, flu-like symptoms, shortness of breath, cough etc.) before then, please call [phone].  If you test positive for Covid 19 in the 2 weeks post procedure, please call and report this information to [place].    If any biopsies were taken you will be contacted by phone or by letter within the next 1-3 weeks.  Please call us at [phone] if you have not heard about the biopsies in 3 weeks.   SIGNATURES/CONFIDENTIALITY: You and/or your care partner have signed paperwork which will be entered into your electronic medical record.  These signatures attest to the fact that that the information above on your After Visit Summary has been reviewed and is understood.  Full responsibility of the confidentiality of this discharge information lies with you and/or your care-partner.

## 2021-05-22 NOTE — Progress Notes (Signed)
Called to room to assist during endoscopic procedure.  Patient ID and intended procedure confirmed with present staff. Received instructions for my participation in the procedure from the performing physician.  

## 2021-05-23 ENCOUNTER — Other Ambulatory Visit: Payer: Self-pay

## 2021-05-23 ENCOUNTER — Encounter: Payer: Self-pay | Admitting: *Deleted

## 2021-05-23 ENCOUNTER — Inpatient Hospital Stay: Payer: Medicare Other | Attending: Oncology | Admitting: Oncology

## 2021-05-23 VITALS — BP 155/81 | HR 96 | Temp 97.8°F | Resp 18 | Ht 69.0 in | Wt 173.6 lb

## 2021-05-23 DIAGNOSIS — C20 Malignant neoplasm of rectum: Secondary | ICD-10-CM | POA: Diagnosis not present

## 2021-05-23 NOTE — Progress Notes (Signed)
[facility] Patient Consult   Requesting MD: Neil Perry, [address] [place],  [place] [address]   Neil Perry 80 y.o.  1941-07-07    Reason for Consult: Rectal cancer   HPI: [name] is followed by Dr. Jeffie Perry for recurrent prostate cancer, currently maintained on androgen deprivation therapy and Xtandi.  He underwent an MRI of the abdomen on 04/25/2021 for follow-up of a right renal mass initially found on a CT in October 2021.  The MRI revealed 3 new hypervascular masses in the liver dome in the right and left lobes consistent with metastases.  There is a stable 12 mm left adrenal mass.  No pathologically enlarged lymph nodes identified.  A stable right lower pole renal mass was noted.  He was referred for an ultrasound-guided biopsy of a hepatic lesion on 05/07/2021.  The pathology revealed metastatic adenocarcinoma with necrosis.  Immunohistochemical stains found the tumor cells to be positive for cytokeratin 20 and CDX2.  A PSA stain was negative.  The immunophenotype is consistent with a colorectal primary.  He underwent a colonoscopy with Dr. Candis Perry yesterday.  He vomited during the procedure.  The quality of the bowel preparation was poor.  An ulcerated partially obstructing mass was found in the proximal rectum.  The mass measured 5 cm in length.  No bleeding.  The mass could not be passed with an adult colonoscope.  The distal margin the mass was at 15 cm from anal verge in the proximal margin at 20 cm.  A biopsy was obtained.  He is referred for oncology evaluation.   Past Medical History:  Diagnosis Date   Cataract    Hypertension    Prostate cancer (HCC)-Gleason 3+3, simple prostatectomy, recurrent disease in the prostate and pelvic lymphadenopathy October 2021 January 2018    .  Mitral valve prolapse  Past Surgical History:  Procedure Laterality Date   cataract surgery Right    PROSTATE BIOPSY     PROSTATECTOMY-simple  2018    .   Tonsillectomy   .  Bilateral inguinal hernia repair  Medications: Reviewed  Allergies: No Known Allergies  Family history: His father, brother, and paternal grandfather had prostate cancer.  Social History: He lives with his wife in [place].  He is a retired Personal assistant.  He has not used cigarettes.  He reports rare alcohol use.  No transfusion history.  No risk factor for HIV or hepatitis.  He has received COVID-19 vaccines    ROS:   Positives include: Anorexia and 20 pound weight loss beginning April 2022, 3 episodes of vomiting while having a bowel movement since April 2022, intermittent bright red blood per rectum, rectal discomfort  A complete ROS was otherwise negative.  Physical Exam:  Blood pressure (!) 155/81, pulse 96, temperature 97.8 F (36.6 C), temperature source Oral, resp. rate 18, height 5' 9"  (1.753 m), weight 173 lb 9.6 oz (78.7 kg), SpO2 100 %. Lungs: Lungs clear bilaterally Cardiac: Regular rate and rhythm Abdomen: No hepatosplenomegaly, nontender, no mass  Vascular: No leg edema Lymph nodes: No cervical, supraclavicular, axillary, or inguinal nodes Neurologic: Alert and oriented, lower exam appears intact in the upper lower extremities bilaterally Skin: No rash Musculoskeletal: No spine tenderness   LAB:  CBC  Lab Results  Component Value Date   WBC 6.9 05/07/2021   HGB 13.2 05/07/2021   HCT 39.3 05/07/2021   MCV 81.9 05/07/2021   PLT 266 05/07/2021       CEA  on 05/18/2021: 217.1  Imaging: As per HPI, MRI and CT images reviewed with Neil Perry and his wife    Assessment/Plan:   Rectal cancer MRI abdomen 04/26/2019 2-3 new hypervascular masses in the liver dome, no abdominal lymphadenopathy, stable benign left adrenal adenoma, stable right lower pole kidney mass Ultrasound-guided biopsy of a liver lesion 05/07/2021-metastatic adenocarcinoma with extensive necrosis, immunohistochemical profile consistent with a colorectal  primary Colonoscopy 05/22/2021-ulcerated partially obstructing mass at 15 cm from anal verge Prostate cancer-simple prostatectomy January 2019, Gleason 3+3, 10 to 12% of specimen Active surveillance, biopsy May 2019 with small focus of Gleason 3+3 disease in 1/6 cores, surveillance continued Elevated PSA 04/20/2020 Biopsy 06/19/2020 8/12 cores positive, Gleason 4+4 PET scan 07/07/2020-negative CT 07/07/2020 right iliac and retrocaval adenopathy, 1.3 cm subcapsular right lower pole renal lesion Androgen deprivation therapy beginning 08/21/2020 Radiation to prostate, seminal vesicles, and pelvic lymph nodes to 10/22 - 12/28/2020 He continues every 26-monthFirmagon and daily Xtandi  3.  Right renal mass consistent with a renal cell carcinoma-stable on MRI abdomen 8/3/20224.  4.  Mitral valve prolapse 5.  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  I discussed the prognosis and treatment options with [name] his wife.  We reviewed CT and MRI images today.  He understands no therapy will be curative.  Initial treatment will likely be with systemic chemotherapy (FOLFOX).  We discussed the need for Port-A-Cath placement.  The liver biopsy will be submitted for Foundation 1 testing.  We will request immunohistochemistry staining for mismatch repair proteins on the rectal biopsy.  He will be referred for baseline staging CTs.  Mr. RJohndrowwill return for an office visit and further discussion on 06/04/2021.  He most likely does not have hereditary polyposis colon cancer syndrome, but his family members are at increased risk of developing colorectal cancer and should undergo appropriate screening.    GBetsy Coder MD  05/23/2021, 1:38 PM

## 2021-05-23 NOTE — Progress Notes (Signed)
Met with patient and his wife with Dr [name], provided contact information to clinic, Oncology Navigation, reviewed upcoming scans, appointments, Port placement with demonstration. Will continue to follow with Dr [name] and team. Orders placed for [facility] One testing, MMR by [facility] via Pathology and for [facility] placement by [facility].

## 2021-05-24 ENCOUNTER — Telehealth: Payer: Self-pay | Admitting: *Deleted

## 2021-05-24 NOTE — Telephone Encounter (Signed)
  Follow up Call-  Call back number 05/22/2021  Post procedure Call Back phone  # [phone]  Permission to leave phone message Yes     Patient questions:  Do you have a fever, pain , or abdominal swelling? No. Pain Score  0 *  Have you tolerated food without any problems? Yes.    Have you been able to return to your normal activities? Yes.    Do you have any questions about your discharge instructions: Diet   No. Medications  No. Follow up visit  No.  Do you have questions or concerns about your Care? No.  Actions: * If pain score is 4 or above: No action needed, pain <4.  Have you developed a fever since your procedure? no  2.   Have you had an respiratory symptoms (SOB or cough) since your procedure?  no 3.   Have you tested positive for COVID 19 since your procedure no  4.   Have you had any family members/close contacts diagnosed with the COVID 19 since your procedure?  no   If yes to any of these questions please route to Joylene John, RN and Joella Prince, RN

## 2021-05-30 ENCOUNTER — Other Ambulatory Visit: Payer: Self-pay

## 2021-05-30 ENCOUNTER — Ambulatory Visit (HOSPITAL_BASED_OUTPATIENT_CLINIC_OR_DEPARTMENT_OTHER)
Admission: RE | Admit: 2021-05-30 | Discharge: 2021-05-30 | Disposition: A | Discharge status: Home / Self Care | Payer: Medicare Other | Source: Ambulatory Visit | Attending: Oncology | Admitting: Oncology

## 2021-05-30 ENCOUNTER — Inpatient Hospital Stay: Payer: Medicare Other | Attending: Oncology

## 2021-05-30 DIAGNOSIS — C20 Malignant neoplasm of rectum: Secondary | ICD-10-CM | POA: Diagnosis present

## 2021-05-30 LAB — POCT I-STAT CREATININE: Creatinine, Ser: 1.2 mg/dL (ref 0.61–1.24)

## 2021-05-30 MED ORDER — IOHEXOL 350 MG/ML SOLN
75.0000 mL | Freq: Once | INTRAVENOUS | Status: AC | PRN
  Administered 2021-05-30: 75 mL via INTRAVENOUS

## 2021-06-01 ENCOUNTER — Other Ambulatory Visit: Payer: Self-pay | Admitting: Student

## 2021-06-04 ENCOUNTER — Inpatient Hospital Stay: Payer: Medicare Other | Admitting: Oncology

## 2021-06-04 ENCOUNTER — Encounter (HOSPITAL_COMMUNITY): Payer: Self-pay

## 2021-06-04 ENCOUNTER — Other Ambulatory Visit: Payer: Self-pay

## 2021-06-04 ENCOUNTER — Ambulatory Visit (HOSPITAL_COMMUNITY)
Admission: RE | Admit: 2021-06-04 | Discharge: 2021-06-04 | Disposition: A | Discharge status: Home / Self Care | Payer: Medicare Other | Source: Ambulatory Visit | Attending: Oncology | Admitting: Oncology

## 2021-06-04 DIAGNOSIS — C20 Malignant neoplasm of rectum: Secondary | ICD-10-CM | POA: Insufficient documentation

## 2021-06-04 DIAGNOSIS — Z8546 Personal history of malignant neoplasm of prostate: Secondary | ICD-10-CM | POA: Diagnosis not present

## 2021-06-04 DIAGNOSIS — Z79899 Other long term (current) drug therapy: Secondary | ICD-10-CM | POA: Diagnosis not present

## 2021-06-04 DIAGNOSIS — I10 Essential (primary) hypertension: Secondary | ICD-10-CM | POA: Insufficient documentation

## 2021-06-04 DIAGNOSIS — C787 Secondary malignant neoplasm of liver and intrahepatic bile duct: Secondary | ICD-10-CM | POA: Insufficient documentation

## 2021-06-04 HISTORY — PX: IR IMAGING GUIDED PORT INSERTION: IMG5740

## 2021-06-04 MED ORDER — LIDOCAINE-EPINEPHRINE 2 %-1:100000 IJ SOLN
INTRAMUSCULAR | Status: DC | PRN
  Administered 2021-06-04: 10 mL

## 2021-06-04 MED ORDER — MIDAZOLAM HCL 2 MG/2ML IJ SOLN
INTRAMUSCULAR | Status: AC
  Filled 2021-06-04: qty 2

## 2021-06-04 MED ORDER — HEPARIN SOD (PORK) LOCK FLUSH 100 UNIT/ML IV SOLN
INTRAVENOUS | Status: AC
  Filled 2021-06-04: qty 5

## 2021-06-04 MED ORDER — FENTANYL CITRATE (PF) 100 MCG/2ML IJ SOLN
INTRAMUSCULAR | Status: AC
  Filled 2021-06-04: qty 2

## 2021-06-04 MED ORDER — LIDOCAINE-EPINEPHRINE 1 %-1:100000 IJ SOLN
INTRAMUSCULAR | Status: AC
  Filled 2021-06-04: qty 1

## 2021-06-04 MED ORDER — SODIUM CHLORIDE 0.9 % IV SOLN: INTRAVENOUS | Status: DC

## 2021-06-04 MED ORDER — LIDOCAINE HCL (PF) 1 % IJ SOLN
INTRAMUSCULAR | Status: DC | PRN
  Administered 2021-06-04: 10 mL via INTRADERMAL

## 2021-06-04 MED ORDER — MIDAZOLAM HCL 2 MG/2ML IJ SOLN
INTRAMUSCULAR | Status: DC | PRN
  Administered 2021-06-04: 1 mg via INTRAVENOUS

## 2021-06-04 MED ORDER — LIDOCAINE-EPINEPHRINE 1 %-1:100000 IJ SOLN
INTRAMUSCULAR | Status: DC | PRN
  Administered 2021-06-04: 10 mL via INTRADERMAL

## 2021-06-04 MED ORDER — HEPARIN SOD (PORK) LOCK FLUSH 100 UNIT/ML IV SOLN
INTRAVENOUS | Status: DC | PRN
  Administered 2021-06-04: 500 [IU] via INTRAVENOUS

## 2021-06-04 MED ORDER — FENTANYL CITRATE (PF) 100 MCG/2ML IJ SOLN
INTRAMUSCULAR | Status: DC | PRN
  Administered 2021-06-04: 50 ug via INTRAVENOUS

## 2021-06-04 MED ORDER — LIDOCAINE HCL (PF) 1 % IJ SOLN
INTRAMUSCULAR | Status: DC | PRN
  Administered 2021-06-04: 10 mL

## 2021-06-04 MED ORDER — LIDOCAINE HCL 1 % IJ SOLN
INTRAMUSCULAR | Status: AC
  Filled 2021-06-04: qty 20

## 2021-06-04 NOTE — H&P (Signed)
Chief Complaint: Patient was seen in consultation today for insertion of image guided port-a-catheter at the request of Sherrill,Gary B  Supervising Physician: Markus Daft  Patient Status: Neil Perry  History of Present Illness: Tavarez Afonso is a 80 y.o. male with history of prostate cancer, metastatic disease of the liver, renal mass, and HTN.  He expects to begin chemotherapy in the immediate future.  He presents today reporting feeling well and ready for procedure.  He endorses being NPO, having a driver to go home, and wife to stay with him during day and overnight.   Past Medical History:  Diagnosis Date   Cataract    Hypertension    Prostate cancer [facility])     Past Surgical History:  Procedure Laterality Date   cataract surgery Right    LIVER BIOPSY     August 2022   PROSTATE BIOPSY     PROSTATECTOMY  2018    Allergies: Patient has no known allergies.  Medications: Prior to Admission medications   Medication Sig Start Date End Date Taking? Authorizing Provider  amLODipine (NORVASC) 5 MG tablet TAKE 1 TABLET BY MOUTH EVERY DAY 12/19/20  Yes Marin Olp, MD  enzalutamide Gillermina Phy) 40 MG tablet Take 80 mg by mouth daily.   Yes [provider]  Melatonin 3 MG CAPS Take by mouth.   Yes [provider]  Difluprednate 0.05 % EMUL     [provider]  solifenacin (VESICARE) 5 MG tablet Take 5 mg by mouth at bedtime. 04/11/21   [provider]     Family History  Problem Relation Age of Onset   Prostate cancer Father    Prostate cancer Brother    Prostate cancer Paternal Grandfather    Breast cancer Neg Hx    Colon cancer Neg Hx    Pancreatic cancer Neg Hx     Social History   Socioeconomic History   Marital status: Married    Spouse name: Not on file   Number of children: Not on file   Years of education: Not on file   Highest education level: Not on file  Occupational History   Occupation: attorney    Comment:  retired  Tobacco Use   Smoking status: Never   Smokeless tobacco: Never  Vaping Use   Vaping Use: Never used  Substance and Sexual Activity   Alcohol use: Yes    Alcohol/week: 2.0 standard drinks    Types: 2 Glasses of wine per week   Drug use: Never   Sexual activity: Not Currently  Other Topics Concern   Not on file  Social History Narrative   Not on file   Social Determinants of Health   Financial Resource Strain: Not on file  Food Insecurity: Not on file  Transportation Needs: Not on file  Physical Activity: Not on file  Stress: Not on file  Social Connections: Not on file    Review of Systems: A 12 point ROS discussed and pertinent positives are indicated in the HPI above.  All other systems are negative.  Review of Systems  Constitutional: Negative.   HENT: Negative.    Respiratory: Negative.    Cardiovascular: Negative.   Gastrointestinal:  Negative for abdominal distention, abdominal pain, constipation and nausea.  Skin: Negative.   Neurological: Negative.   Psychiatric/Behavioral:  Negative for behavioral problems and confusion.    Vital Signs: BP (!) 160/87   Pulse 71   Temp 97.6 F (36.4 C) (Oral)   Resp  16   SpO2 99%   Physical Exam  Imaging: CT CHEST ABDOMEN PELVIS W CONTRAST  Result Date: 05/30/2021 CLINICAL DATA:  80 year old male with history of colorectal cancer. Staging examination. EXAM: CT CHEST, ABDOMEN, AND PELVIS WITH CONTRAST TECHNIQUE: Multidetector CT imaging of the chest, abdomen and pelvis was performed following the standard protocol during bolus administration of intravenous contrast. CONTRAST:  44m OMNIPAQUE IOHEXOL 350 MG/ML SOLN COMPARISON:  CT of the abdomen and pelvis 07/07/2020. Abdominal MRI 04/25/2021. No prior chest CT. FINDINGS: CT CHEST FINDINGS Cardiovascular: Heart size is normal. There is no significant pericardial fluid, thickening or pericardial calcification. There is aortic atherosclerosis, as well as atherosclerosis  of the great vessels of the mediastinum and the coronary arteries, including calcified atherosclerotic plaque in the left anterior descending and right coronary arteries. Mediastinum/Nodes: No pathologically enlarged mediastinal or hilar lymph nodes. Esophagus is unremarkable in appearance. No axillary lymphadenopathy. Lungs/Pleura: There are multiple small pulmonary nodules scattered throughout the lungs bilaterally, highly suspicious for widespread metastatic disease, largest of which is in the lateral segment of the right middle lobe (axial image 110 of series 4) measuring 8 x 5 mm. No acute consolidative airspace disease. No pleural effusions. Musculoskeletal: There are no aggressive appearing lytic or blastic lesions noted in the visualized portions of the skeleton. CT ABDOMEN PELVIS FINDINGS Hepatobiliary: There are multiple hypovascular hepatic lesions, as demonstrated on recent abdominal MRI, compatible with metastatic disease to the liver. The largest of these is in the superior aspect of the liver predominantly in segment 8 (axial image 53 of series 2 and coronal image 53 of series 6) measuring 5.7 x 6.5 x 5.4 cm. Several other smaller hypovascular lesions are noted as well. In addition, there are many more well-defined low-attenuation nonenhancing lesions compatible with simple cysts, largest of which is between segments 5 and 6 (axial image 68 of series 2) measuring 3.6 cm in diameter. No intra or extrahepatic biliary ductal dilatation. Gallbladder is normal in appearance. Pancreas: No pancreatic mass. No pancreatic ductal dilatation. No pancreatic or peripancreatic fluid collections or inflammatory changes. Spleen: Unremarkable. Adrenals/Urinary Tract: In the posterior aspect of the lower pole of the right kidney (axial image 78 of series 2) there is an exophytic 1.4 cm enhancing lesion, highly concerning for a small renal cell carcinoma. Left kidney and right adrenal gland are normal in appearance. Two  left adrenal nodules are again noted measuring 1.1 x 1.2 cm (axial image 12 of series 5) and 1.7 x 1.5 cm (axial image 65 of series 2), stable compared to prior studies, previously characterized as benign adenomas. No hydroureteronephrosis. Urinary bladder is largely decompressed. Circumferential bladder wall thickening is noted. Stomach/Bowel: The appearance of the stomach is normal. There is no pathologic dilatation of small bowel or colon. Numerous colonic diverticulae are noted, without surrounding inflammatory changes to suggest an acute diverticulitis at this time. Normal appendix. Circumferential thickening throughout the rectum where there is increased mucosal enhancement and slight haziness in the adjacent mesorectal fat. Vascular/Lymphatic: Atherosclerotic calcifications in the abdominal aorta and pelvic vasculature, without evidence of aneurysm or dissection in the abdomen or pelvis. Reproductive: Fiducial markers are noted in the prostate gland. Seminal vesicles are unremarkable in appearance. Other: No significant volume of ascites.  No pneumoperitoneum. Musculoskeletal: There are no aggressive appearing lytic or blastic lesions noted in the visualized portions of the skeleton. IMPRESSION: 1. Metastatic disease to the liver is similar to recent abdominal MRI, as detailed above. In addition, there are numerous small pulmonary nodules  scattered throughout the lungs bilaterally, concerning for metastatic disease to the lungs. 2. Severe thickening of the rectum with increased mucosal enhancement and surrounding inflammatory changes in the mesorectal fat. Given the proximity to the prostate gland, these findings may reflect postradiation changes, or could be indicative of proctitis. There is also some bladder wall thickening which may also be related to prior radiation therapy. Further clinical evaluation is recommended. 3. Colonic diverticulosis without evidence of acute diverticulitis at this time. 4. Two  small left adrenal adenomas are similar to prior studies. 5. Aortic atherosclerosis, in addition to 2 vessel coronary artery disease. 6. Additional incidental findings, as above. Electronically Signed   By: Vinnie Langton M.D.   On: 05/30/2021 19:49   US BIOPSY (LIVER)  Result Date: 05/07/2021 INDICATION: History of prostate cancer and small right lower pole renal neoplasm now with unexpected large hepatic lesions concerning for hepatic metastatic disease of uncertain primary origin. He presents for ultrasound-guided core biopsy of the same. EXAM: ULTRASOUND BIOPSY CORE LIVER MEDICATIONS: None. ANESTHESIA/SEDATION: Moderate (conscious) sedation was employed during this procedure. A total of Versed 2 mg and Fentanyl 75 mcg was administered intravenously. Moderate Sedation Time: 17 minutes. The patient's level of consciousness and vital signs were monitored continuously by radiology nursing throughout the procedure under my direct supervision. FLUOROSCOPY TIME:  Fluoroscopy Time: 0 minutes 0 seconds (0 mGy). COMPLICATIONS: None immediate. PROCEDURE: Informed written consent was obtained from the patient after a thorough discussion of the procedural risks, benefits and alternatives. All questions were addressed. Maximal Sterile Barrier Technique was utilized including caps, mask, sterile gowns, sterile gloves, sterile drape, hand hygiene and skin antiseptic. A timeout was performed prior to the initiation of the procedure. The right upper quadrant was interrogated with ultrasound. Large solid mildly hypoechoic masses are identified in the hepatic dome corresponding with lesion seen on prior MR imaging. A suitable skin entry site was selected and marked. The region was sterilely prepped and draped in the standard fashion using chlorhexidine skin prep. Local anesthesia was attained by infiltration with 1% lidocaine. A small dermatotomy was made. Under real-time sonographic guidance, a 17 gauge introducer needle was  advanced into the margin of the mass. Multiple 18 gauge biopsies were then coaxially obtained using the BioPince automated biopsy device. Biopsy samples were placed in formalin and delivered to pathology for further analysis. As a 50 gauge introducer needle was removed, the biopsy tract was embolized with a Gel-Foam slurry. Post biopsy ultrasound imaging demonstrates no evidence of active hemorrhage or perihepatic hematoma. The patient tolerated the procedure well. IMPRESSION: Technically successful ultrasound-guided core biopsy of hepatic lesion. Electronically Signed   By: Jacqulynn Cadet M.D.   On: 05/07/2021 15:41    Labs:  CBC: Recent Labs    05/07/21 1310  WBC 6.9  HGB 13.2  HCT 39.3  PLT 266    COAGS: Recent Labs    05/07/21 1310  INR 1.0  APTT 29    BMP: Recent Labs    05/30/21 1058  CREATININE 1.20    LIVER FUNCTION TESTS: No results for input(s): BILITOT, AST, ALT, ALKPHOS, PROT, ALBUMIN in the last 8760 hours.  TUMOR MARKERS: Recent Labs    05/18/21 1457  AFPTM 2.8  CEA 217.1*  CA199 106*    Assessment and Plan:  Neriah Schacher is a 80 y.o. male with history of prostate cancer, metastatic disease of the liver, renal mass, and HTN.  Ready for planned procedure.  Risks and benefits of image guided port-a-catheter placement  was discussed with the patient including, but not limited to bleeding, infection, pneumothorax, or fibrin sheath development and need for additional procedures.  All of the patient's questions were answered, patient is agreeable to proceed. Consent signed and in chart.    Thank you for this interesting consult.  I greatly enjoyed meeting Ceola Mania and look forward to participating in their care.  A copy of this report was sent to the requesting provider on this date.  Electronically Signed: Pasty Spillers, PA 06/04/2021, 1:33 PM   I spent a total of 25 Minutes in face to face in clinical consultation, greater than 50% of  which was counseling/coordinating care for image-guided port-a-catheter insertion.

## 2021-06-04 NOTE — Procedures (Signed)
Interventional Radiology Procedure:   Indications: Metastatic rectal cancer  Procedure: Port placement  Findings: Right jugular port, tip at SVC/RA junction  Complications: None     EBL: Minimal, less than 10 ml  Plan: Discharge in one hour.  Keep port site and incisions dry for at least 24 hours.     Venkat Ankney R. Anselm Pancoast, MD  Pager: [phone]

## 2021-06-04 NOTE — Discharge Instructions (Signed)

## 2021-06-05 ENCOUNTER — Other Ambulatory Visit: Payer: Self-pay

## 2021-06-05 ENCOUNTER — Encounter: Payer: Self-pay | Admitting: *Deleted

## 2021-06-05 ENCOUNTER — Inpatient Hospital Stay (HOSPITAL_BASED_OUTPATIENT_CLINIC_OR_DEPARTMENT_OTHER): Payer: Medicare Other | Admitting: Oncology

## 2021-06-05 DIAGNOSIS — Z923 Personal history of irradiation: Secondary | ICD-10-CM | POA: Insufficient documentation

## 2021-06-05 DIAGNOSIS — C61 Malignant neoplasm of prostate: Secondary | ICD-10-CM | POA: Insufficient documentation

## 2021-06-05 DIAGNOSIS — C7802 Secondary malignant neoplasm of left lung: Secondary | ICD-10-CM | POA: Diagnosis not present

## 2021-06-05 DIAGNOSIS — Z5111 Encounter for antineoplastic chemotherapy: Secondary | ICD-10-CM | POA: Diagnosis not present

## 2021-06-05 DIAGNOSIS — C7801 Secondary malignant neoplasm of right lung: Secondary | ICD-10-CM | POA: Diagnosis not present

## 2021-06-05 DIAGNOSIS — C787 Secondary malignant neoplasm of liver and intrahepatic bile duct: Secondary | ICD-10-CM | POA: Insufficient documentation

## 2021-06-05 DIAGNOSIS — C20 Malignant neoplasm of rectum: Secondary | ICD-10-CM | POA: Insufficient documentation

## 2021-06-05 DIAGNOSIS — Z79899 Other long term (current) drug therapy: Secondary | ICD-10-CM | POA: Diagnosis not present

## 2021-06-05 NOTE — Progress Notes (Signed)
Patient and wife met with Dr [name] to discuss FOLFOX treatment, which will begin next Tuesday 9/20, moleculars from [facility] are pending and expected to result on 9/19. Patient ed session has been arranged for Mr. Hancock for Friday 9/16 with Nurse educator Merceda Elks RN. Pt verbalized understanding and agreement of plan and was advised to call with any issues or questions

## 2021-06-05 NOTE — Progress Notes (Signed)
START ON PATHWAY REGIMEN - Colorectal     A cycle is every 14 days:     Oxaliplatin      Leucovorin      Fluorouracil      Fluorouracil   **Always confirm dose/schedule in your pharmacy ordering system**  Patient Characteristics: Distant Metastases, Nonsurgical Candidate, KRAS/NRAS Mutation Positive/Unknown (BRAF V600 Wild-Type/Unknown), Standard Cytotoxic Therapy, First Line Standard Cytotoxic Therapy, Bevacizumab Ineligible, PS = 0,1 Tumor Location: Rectal Therapeutic Status: Distant Metastases Microsatellite/Mismatch Repair Status: Unknown BRAF Mutation Status: Awaiting Test Results KRAS/NRAS Mutation Status: Awaiting Test Results Standard Cytotoxic Line of Therapy: First Line Standard Cytotoxic Therapy ECOG Performance Status: 1 Bevacizumab Eligibility: Ineligible Intent of Therapy: Non-Curative / Palliative Intent, Discussed with Patient 

## 2021-06-05 NOTE — Progress Notes (Signed)
[facility] OFFICE PROGRESS NOTE   Diagnosis: Rectal cancer  INTERVAL HISTORY:   [name] returns as scheduled.  No new complaint.  He underwent Port-A-Cath placement on 06/04/2021.  He is here today to discuss treatment options.  Objective:  Vital signs in last 24 hours:  Blood pressure (!) 158/80, pulse 73, temperature 98.1 F (36.7 C), temperature source Oral, resp. rate 18, height 5' 9"  (1.753 m), weight 175 lb (79.4 kg), SpO2 100 %.    Resp: Lungs clear bilaterally Cardio: Regular rate and rhythm GI: No hepatosplenomegaly, no mass, nontender Vascular: No leg edema    Portacath/PICC-without erythema  Lab Results:  Lab Results  Component Value Date   WBC 6.9 05/07/2021   HGB 13.2 05/07/2021   HCT 39.3 05/07/2021   MCV 81.9 05/07/2021   PLT 266 05/07/2021    CMP  Lab Results  Component Value Date   CREATININE 1.20 05/30/2021    Lab Results  Component Value Date   CEA 217.1 (H) 05/18/2021    Lab Results  Component Value Date   INR 1.0 05/07/2021   LABPROT 13.6 05/07/2021    Imaging:  IR IMAGING GUIDED PORT INSERTION  Result Date: 06/04/2021 INDICATION: 80 year old with rectal cancer. Port-A-Cath needed for chemotherapy. EXAM: FLUOROSCOPIC AND ULTRASOUND GUIDED PLACEMENT OF A SUBCUTANEOUS PORT COMPARISON:  None. MEDICATIONS: Moderate sedation ANESTHESIA/SEDATION: Versed 2.0 mg IV; Fentanyl 100 mcg IV; Moderate Sedation Time:  26 minutes The patient was continuously monitored during the procedure by the interventional radiology nurse under my direct supervision. FLUOROSCOPY TIME:  18 seconds, 1 mGy COMPLICATIONS: None immediate. PROCEDURE: The procedure, risks, benefits, and alternatives were explained to the patient. Questions regarding the procedure were encouraged and answered. The patient understands and consents to the procedure. Patient was placed supine on the interventional table. Ultrasound confirmed a patent right internal jugular  vein. Ultrasound image was saved for documentation. The right chest and neck were cleaned with a skin antiseptic and a sterile drape was placed. Maximal barrier sterile technique was utilized including caps, mask, sterile gowns, sterile gloves, sterile drape, hand hygiene and skin antiseptic. The right neck was anesthetized with 1% lidocaine. Small incision was made in the right neck with a blade. Micropuncture set was placed in the right internal jugular vein with ultrasound guidance. The micropuncture wire was used for measurement purposes. The right chest was anesthetized with 1% lidocaine with epinephrine. #15 blade was used to make an incision and a subcutaneous port pocket was formed. Marks was assembled. Subcutaneous tunnel was formed with a stiff tunneling device. The port catheter was brought through the subcutaneous tunnel. The port was placed in the subcutaneous pocket. The micropuncture set was exchanged for a peel-away sheath. The catheter was placed through the peel-away sheath and the tip was positioned at the superior cavoatrial junction. Catheter placement was confirmed with fluoroscopy. The port was accessed and flushed with heparinized saline. The port pocket was closed using two layers of absorbable sutures and Dermabond. The vein skin site was closed using a single layer of absorbable suture and Dermabond. Sterile dressings were applied. Patient tolerated the procedure well without an immediate complication. Ultrasound and fluoroscopic images were taken and saved for this procedure. IMPRESSION: Placement of a subcutaneous power-injectable port device. Catheter tip at the superior cavoatrial junction. Electronically Signed   By: Markus Daft M.D.   On: 06/04/2021 18:25    Medications: I have reviewed the patient's current medications.   Assessment/Plan: Rectal cancer MRI  abdomen 04/26/2019 2-3 new hypervascular masses in the liver dome, no abdominal lymphadenopathy, stable benign  left adrenal adenoma, stable right lower pole kidney mass Ultrasound-guided biopsy of a liver lesion 05/07/2021-metastatic adenocarcinoma with extensive necrosis, immunohistochemical profile consistent with a colorectal primary Colonoscopy 05/22/2021-ulcerated partially obstructing mass at 15 cm from anal verge CTs 05/30/2021-numerous small pulmonary nodules concerning for metastases, thickening of the rectum, multiple liver metastases Prostate cancer-simple prostatectomy January 2019, Gleason 3+3, 10 to 12% of specimen Active surveillance, biopsy May 2019 with small focus of Gleason 3+3 disease in 1/6 cores, surveillance continued Elevated PSA 04/20/2020 Biopsy 06/19/2020 8/12 cores positive, Gleason 4+4 PET scan 07/07/2020-negative CT 07/07/2020 right iliac and retrocaval adenopathy, 1.3 cm subcapsular right lower pole renal lesion Androgen deprivation therapy beginning 08/21/2020 Radiation to prostate, seminal vesicles, and pelvic lymph nodes to 10/22 - 12/28/2020 He continues every 31-monthFirmagon and daily Xtandi  3.  Right renal mass consistent with a renal cell carcinoma-stable on MRI abdomen 8/3/20224.  4.  Mitral valve prolapse 5.  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  The staging CT scans are consistent with metastatic disease involving the lungs and liver.  We discussed treatment options.  I recommend FOLFOX chemotherapy.  We are waiting on next generation sequencing and mismatch repair protein testing to determine whether he may be a candidate for immunotherapy, biologic therapy, or targeted therapy in the future.  We reviewed potential toxicities associated with the FOLFOX regimen including the chance of nausea/vomiting, mucositis, diarrhea, alopecia, and hematologic toxicity.  We discussed the chance of infection and bleeding.  We reviewed the rash, sun sensitivity, hyperpigmentation, and hand/foot syndrome  associated with 5-fluorouracil.  We discussed the allergic reaction and various types of neuropathy associated with oxaliplatin.  He agrees to proceed.  He will attend a chemotherapy teach class.  The plan is to begin FOLFOX on 06/12/2021.  He will return for an office visit and cycle 2 FOLFOX on 06/26/2021.  A chemotherapy plan was entered today.  GBetsy Coder MD  06/05/2021  11:00 AM

## 2021-06-07 ENCOUNTER — Encounter (HOSPITAL_COMMUNITY): Payer: Self-pay

## 2021-06-07 ENCOUNTER — Encounter (HOSPITAL_COMMUNITY): Payer: Self-pay | Admitting: Oncology

## 2021-06-08 ENCOUNTER — Inpatient Hospital Stay: Payer: Medicare Other

## 2021-06-08 ENCOUNTER — Other Ambulatory Visit: Payer: Self-pay

## 2021-06-08 DIAGNOSIS — C20 Malignant neoplasm of rectum: Secondary | ICD-10-CM

## 2021-06-08 DIAGNOSIS — Z5111 Encounter for antineoplastic chemotherapy: Secondary | ICD-10-CM | POA: Diagnosis not present

## 2021-06-08 LAB — CMP (CANCER CENTER ONLY)
ALT: 10 U/L (ref 0–44)
AST: 14 U/L — ABNORMAL LOW (ref 15–41)
Albumin: 4.2 g/dL (ref 3.5–5.0)
Alkaline Phosphatase: 98 U/L (ref 38–126)
Anion gap: 9 (ref 5–15)
BUN: 17 mg/dL (ref 8–23)
CO2: 26 mmol/L (ref 22–32)
Calcium: 9.8 mg/dL (ref 8.9–10.3)
Chloride: 105 mmol/L (ref 98–111)
Creatinine: 1.2 mg/dL (ref 0.61–1.24)
GFR, Estimated: >60 mL/min (ref >60)
Glucose, Bld: 128 mg/dL — ABNORMAL HIGH (ref 70–99)
Potassium: 4.3 mmol/L (ref 3.5–5.1)
Sodium: 140 mmol/L (ref 135–145)
Total Bilirubin: 0.8 mg/dL (ref 0.3–1.2)
Total Protein: 6.9 g/dL (ref 6.5–8.1)

## 2021-06-08 LAB — CBC WITH DIFFERENTIAL (CANCER CENTER ONLY)
Abs Immature Granulocytes: 0.03 10*3/uL (ref 0.00–0.07)
Basophils Absolute: 0 10*3/uL (ref 0.0–0.1)
Basophils Relative: 0 %
Eosinophils Absolute: 0.3 10*3/uL (ref 0.0–0.5)
Eosinophils Relative: 4 %
HCT: 41.1 % (ref 39.0–52.0)
Hemoglobin: 13.7 g/dL (ref 13.0–17.0)
Immature Granulocytes: 0 %
Lymphocytes Relative: 7 %
Lymphs Abs: 0.6 10*3/uL — ABNORMAL LOW (ref 0.7–4.0)
MCH: 26.9 pg (ref 26.0–34.0)
MCHC: 33.3 g/dL (ref 30.0–36.0)
MCV: 80.7 fL (ref 80.0–100.0)
Monocytes Absolute: 0.5 10*3/uL (ref 0.1–1.0)
Monocytes Relative: 6 %
Neutro Abs: 6.5 10*3/uL (ref 1.7–7.7)
Neutrophils Relative %: 83 %
Platelet Count: 324 10*3/uL (ref 150–400)
RBC: 5.09 MIL/uL (ref 4.22–5.81)
RDW: 15.4 % (ref 11.5–15.5)
WBC Count: 7.8 10*3/uL (ref 4.0–10.5)
nRBC: 0 % (ref 0.0–0.2)

## 2021-06-08 LAB — CEA (ACCESS): CEA (CHCC): 344.43 ng/mL — ABNORMAL HIGH (ref 0.00–5.00)

## 2021-06-08 MED ORDER — LIDOCAINE-PRILOCAINE 2.5-2.5 % EX CREA
1.0000 | TOPICAL_CREAM | CUTANEOUS | 2 refills | Status: AC
Start: 2021-06-08 — End: ?

## 2021-06-08 MED ORDER — PROCHLORPERAZINE MALEATE 10 MG PO TABS: 10.0000 mg | ORAL_TABLET | Freq: Four times a day (QID) | ORAL | 1 refills | Status: DC | PRN

## 2021-06-08 MED ORDER — ONDANSETRON HCL 8 MG PO TABS: 8.0000 mg | ORAL_TABLET | Freq: Three times a day (TID) | ORAL | 1 refills | Status: DC | PRN

## 2021-06-08 NOTE — Progress Notes (Signed)
Per Dr. [name]: Does not need labs again on 9/20 for his 1st treatment. Patient asking when he can have his infected/loose wisdom tooth extracted by dentist. Per Dr. [name], try to schedule ~2 days before a treatment. Patient notified.

## 2021-06-11 NOTE — Progress Notes (Signed)
Pharmacist Chemotherapy Monitoring - Initial Assessment    Anticipated start date: 06/12/21   The following has been reviewed per standard work regarding the patient's treatment regimen: The patient's diagnosis, treatment plan and drug doses, and organ/hematologic function Lab orders and baseline tests specific to treatment regimen  The treatment plan start date, drug sequencing, and pre-medications Prior authorization status  Patient's documented medication list, including drug-drug interaction screen and prescriptions for anti-emetics and supportive care specific to the treatment regimen The drug concentrations, fluid compatibility, administration routes, and timing of the medications to be used The patient's access for treatment and lifetime cumulative dose history, if applicable  The patient's medication allergies and previous infusion related reactions, if applicable   Changes made to treatment plan:  N/A  Follow up needed:  N/A   Neil Perry, RPH, 06/11/2021  3:20 PM

## 2021-06-12 ENCOUNTER — Inpatient Hospital Stay: Payer: Medicare Other

## 2021-06-12 ENCOUNTER — Other Ambulatory Visit: Payer: Self-pay

## 2021-06-12 ENCOUNTER — Encounter: Payer: Self-pay | Admitting: Nurse Practitioner

## 2021-06-12 ENCOUNTER — Inpatient Hospital Stay (HOSPITAL_BASED_OUTPATIENT_CLINIC_OR_DEPARTMENT_OTHER): Payer: Medicare Other | Admitting: Nurse Practitioner

## 2021-06-12 VITALS — BP 152/76 | HR 79 | Temp 98.1°F | Resp 19 | Ht 69.0 in | Wt 175.8 lb

## 2021-06-12 DIAGNOSIS — C20 Malignant neoplasm of rectum: Secondary | ICD-10-CM

## 2021-06-12 DIAGNOSIS — Z5111 Encounter for antineoplastic chemotherapy: Secondary | ICD-10-CM | POA: Diagnosis not present

## 2021-06-12 MED ORDER — LEUCOVORIN CALCIUM INJECTION 350 MG
400.0000 mg/m2 | Freq: Once | INTRAVENOUS | Status: AC
  Administered 2021-06-12: 788 mg via INTRAVENOUS
  Filled 2021-06-12: qty 39.4

## 2021-06-12 MED ORDER — FLUOROURACIL CHEMO INJECTION 2.5 GM/50ML
400.0000 mg/m2 | Freq: Once | INTRAVENOUS | Status: AC
  Administered 2021-06-12: 800 mg via INTRAVENOUS
  Filled 2021-06-12: qty 16

## 2021-06-12 MED ORDER — SODIUM CHLORIDE 0.9 % IV SOLN
2000.0000 mg/m2 | INTRAVENOUS | Status: DC
  Administered 2021-06-12: 3950 mg via INTRAVENOUS
  Filled 2021-06-12: qty 79

## 2021-06-12 MED ORDER — SODIUM CHLORIDE 0.9 % IV SOLN
10.0000 mg | Freq: Once | INTRAVENOUS | Status: AC
  Administered 2021-06-12: 10 mg via INTRAVENOUS
  Filled 2021-06-12: qty 1

## 2021-06-12 MED ORDER — DEXTROSE 5 % IV SOLN: Freq: Once | INTRAVENOUS | Status: AC

## 2021-06-12 MED ORDER — OXALIPLATIN CHEMO INJECTION 100 MG/20ML
85.0000 mg/m2 | Freq: Once | INTRAVENOUS | Status: AC
  Administered 2021-06-12: 165 mg via INTRAVENOUS
  Filled 2021-06-12: qty 33

## 2021-06-12 MED ORDER — PALONOSETRON HCL INJECTION 0.25 MG/5ML
0.2500 mg | Freq: Once | INTRAVENOUS | Status: AC
  Administered 2021-06-12: 0.25 mg via INTRAVENOUS
  Filled 2021-06-12: qty 5

## 2021-06-12 NOTE — Patient Instructions (Signed)
[facility]  Discharge Instructions: Thank you for choosing [facility] to provide your oncology and hematology care.   If you have a lab appointment with the [facility], please go directly to the [facility] and check in at the registration area.   Wear comfortable clothing and clothing appropriate for easy access to any Portacath or PICC line.   We strive to give you quality time with your provider. You may need to reschedule your appointment if you arrive late (15 or more minutes).  Arriving late affects you and other patients whose appointments are after yours.  Also, if you miss three or more appointments without notifying the office, you may be dismissed from the clinic at the provider's discretion.      For prescription refill requests, have your pharmacy contact our office and allow 72 hours for refills to be completed.    Today you received the following chemotherapy and/or immunotherapy agents OXALIPLATIN, LEUCOVORIN, 5 FU      To help prevent nausea and vomiting after your treatment, we encourage you to take your nausea medication as directed.  BELOW ARE SYMPTOMS THAT SHOULD BE REPORTED IMMEDIATELY: *FEVER GREATER THAN 100.4 F (38 C) OR HIGHER *CHILLS OR SWEATING *NAUSEA AND VOMITING THAT IS NOT CONTROLLED WITH YOUR NAUSEA MEDICATION *UNUSUAL SHORTNESS OF BREATH *UNUSUAL BRUISING OR BLEEDING *URINARY PROBLEMS (pain or burning when urinating, or frequent urination) *BOWEL PROBLEMS (unusual diarrhea, constipation, pain near the anus) TENDERNESS IN MOUTH AND THROAT WITH OR WITHOUT PRESENCE OF ULCERS (sore throat, sores in mouth, or a toothache) UNUSUAL RASH, SWELLING OR PAIN  UNUSUAL VAGINAL DISCHARGE OR ITCHING   Items with * indicate a potential emergency and should be followed up as soon as possible or go to the Emergency Department if any problems should occur.  Please show the CHEMOTHERAPY ALERT CARD or IMMUNOTHERAPY ALERT CARD  at check-in to the Emergency Department and triage nurse.  Should you have questions after your visit or need to cancel or reschedule your appointment, please contact [facility]  Dept: [phone]  and follow the prompts.  Office hours are 8:00 a.m. to 4:30 p.m. Monday - Friday. Please note that voicemails left after 4:00 p.m. may not be returned until the following business day.  We are closed weekends and major holidays. You have access to a nurse at all times for urgent questions. Please call the main number to the clinic Dept: [phone] and follow the prompts.   For any non-urgent questions, you may also contact your provider using MyChart. We now offer e-Visits for anyone 29 and older to request care online for non-urgent symptoms. For details visit [email or web address].   Also download the MyChart app! Go to the app store, search "MyChart", open the app, select [facility], and log in with your MyChart username and password.  Due to Covid, a mask is required upon entering the hospital/clinic. If you do not have a mask, one will be given to you upon arrival. For doctor visits, patients may have 1 support person aged 79 or older with them. For treatment visits, patients cannot have anyone with them due to current Covid guidelines and our immunocompromised population.   Oxaliplatin Injection What is this medication? OXALIPLATIN (ox AL i PLA tin) is a chemotherapy drug. It targets fast dividing cells, like cancer cells, and causes these cells to die. This medicine is used to treat cancers of the colon and rectum, and many other cancers.  This medicine may be used for other purposes; ask your health care provider or pharmacist if you have questions. COMMON BRAND NAME(S): Eloxatin What should I tell my care team before I take this medication? They need to know if you have any of these conditions: heart disease history of irregular heartbeat liver disease low  blood counts, like white cells, platelets, or red blood cells lung or breathing disease, like asthma take medicines that treat or prevent blood clots tingling of the fingers or toes, or other nerve disorder an unusual or allergic reaction to oxaliplatin, other chemotherapy, other medicines, foods, dyes, or preservatives pregnant or trying to get pregnant breast-feeding How should I use this medication? This drug is given as an infusion into a vein. It is administered in a hospital or clinic by a specially trained health care professional. Talk to your pediatrician regarding the use of this medicine in children. Special care may be needed. Overdosage: If you think you have taken too much of this medicine contact a poison control center or emergency room at once. NOTE: This medicine is only for you. Do not share this medicine with others. What if I miss a dose? It is important not to miss a dose. Call your doctor or health care professional if you are unable to keep an appointment. What may interact with this medication? Do not take this medicine with any of the following medications: cisapride dronedarone pimozide thioridazine This medicine may also interact with the following medications: aspirin and aspirin-like medicines certain medicines that treat or prevent blood clots like warfarin, apixaban, dabigatran, and rivaroxaban cisplatin cyclosporine diuretics medicines for infection like acyclovir, adefovir, amphotericin B, bacitracin, cidofovir, foscarnet, ganciclovir, gentamicin, pentamidine, vancomycin NSAIDs, medicines for pain and inflammation, like ibuprofen or naproxen other medicines that prolong the QT interval (an abnormal heart rhythm) pamidronate zoledronic acid This list may not describe all possible interactions. Give your health care provider a list of all the medicines, herbs, non-prescription drugs, or dietary supplements you use. Also tell them if you smoke, drink  alcohol, or use illegal drugs. Some items may interact with your medicine. What should I watch for while using this medication? Your condition will be monitored carefully while you are receiving this medicine. You may need blood work done while you are taking this medicine. This medicine may make you feel generally unwell. This is not uncommon as chemotherapy can affect healthy cells as well as cancer cells. Report any side effects. Continue your course of treatment even though you feel ill unless your healthcare professional tells you to stop. This medicine can make you more sensitive to cold. Do not drink cold drinks or use ice. Cover exposed skin before coming in contact with cold temperatures or cold objects. When out in cold weather wear warm clothing and cover your mouth and nose to warm the air that goes into your lungs. Tell your doctor if you get sensitive to the cold. Do not become pregnant while taking this medicine or for 9 months after stopping it. Women should inform their health care professional if they wish to become pregnant or think they might be pregnant. Men should not father a child while taking this medicine and for 6 months after stopping it. There is potential for serious side effects to an unborn child. Talk to your health care professional for more information. Do not breast-feed a child while taking this medicine or for 3 months after stopping it. This medicine has caused ovarian failure in some women. This  medicine may make it more difficult to get pregnant. Talk to your health care professional if you are concerned about your fertility. This medicine has caused decreased sperm counts in some men. This may make it more difficult to father a child. Talk to your health care professional if you are concerned about your fertility. This medicine may increase your risk of getting an infection. Call your health care professional for advice if you get a fever, chills, or sore throat,  or other symptoms of a cold or flu. Do not treat yourself. Try to avoid being around people who are sick. Avoid taking medicines that contain aspirin, acetaminophen, ibuprofen, naproxen, or ketoprofen unless instructed by your health care professional. These medicines may hide a fever. Be careful brushing or flossing your teeth or using a toothpick because you may get an infection or bleed more easily. If you have any dental work done, tell your dentist you are receiving this medicine. What side effects may I notice from receiving this medication? Side effects that you should report to your doctor or health care professional as soon as possible: allergic reactions like skin rash, itching or hives, swelling of the face, lips, or tongue breathing problems cough low blood counts - this medicine may decrease the number of white blood cells, red blood cells, and platelets. You may be at increased risk for infections and bleeding nausea, vomiting pain, redness, or irritation at site where injected pain, tingling, numbness in the hands or feet signs and symptoms of bleeding such as bloody or black, tarry stools; red or dark brown urine; spitting up blood or brown material that looks like coffee grounds; red spots on the skin; unusual bruising or bleeding from the eyes, gums, or nose signs and symptoms of a dangerous change in heartbeat or heart rhythm like chest pain; dizziness; fast, irregular heartbeat; palpitations; feeling faint or lightheaded; falls signs and symptoms of infection like fever; chills; cough; sore throat; pain or trouble passing urine signs and symptoms of liver injury like dark yellow or brown urine; general ill feeling or flu-like symptoms; light-colored stools; loss of appetite; nausea; right upper belly pain; unusually weak or tired; yellowing of the eyes or skin signs and symptoms of low red blood cells or anemia such as unusually weak or tired; feeling faint or lightheaded;  falls signs and symptoms of muscle injury like dark urine; trouble passing urine or change in the amount of urine; unusually weak or tired; muscle pain; back pain Side effects that usually do not require medical attention (report to your doctor or health care professional if they continue or are bothersome): changes in taste diarrhea gas hair loss loss of appetite mouth sores This list may not describe all possible side effects. Call your doctor for medical advice about side effects. You may report side effects to FDA at 1-800-FDA-1088. Where should I keep my medication? This drug is given in a hospital or clinic and will not be stored at home. NOTE: This sheet is a summary. It may not cover all possible information. If you have questions about this medicine, talk to your doctor, pharmacist, or health care provider.  2022 Elsevier/Gold Standard (2019-01-27 12:20:35)  Leucovorin injection What is this medication? LEUCOVORIN (loo koe VOR in) is used to prevent or treat the harmful effects of some medicines. This medicine is used to treat anemia caused by a low amount of folic acid in the body. It is also used with 5-fluorouracil (5-FU) to treat colon cancer. This medicine  may be used for other purposes; ask your health care provider or pharmacist if you have questions. What should I tell my care team before I take this medication? They need to know if you have any of these conditions: anemia from low levels of vitamin B-12 in the blood an unusual or allergic reaction to leucovorin, folic acid, other medicines, foods, dyes, or preservatives pregnant or trying to get pregnant breast-feeding How should I use this medication? This medicine is for injection into a muscle or into a vein. It is given by a health care professional in a hospital or clinic setting. Talk to your pediatrician regarding the use of this medicine in children. Special care may be needed. Overdosage: If you think you have  taken too much of this medicine contact a poison control center or emergency room at once. NOTE: This medicine is only for you. Do not share this medicine with others. What if I miss a dose? This does not apply. What may interact with this medication? capecitabine fluorouracil phenobarbital phenytoin primidone trimethoprim-sulfamethoxazole This list may not describe all possible interactions. Give your health care provider a list of all the medicines, herbs, non-prescription drugs, or dietary supplements you use. Also tell them if you smoke, drink alcohol, or use illegal drugs. Some items may interact with your medicine. What should I watch for while using this medication? Your condition will be monitored carefully while you are receiving this medicine. This medicine may increase the side effects of 5-fluorouracil, 5-FU. Tell your doctor or health care professional if you have diarrhea or mouth sores that do not get better or that get worse. What side effects may I notice from receiving this medication? Side effects that you should report to your doctor or health care professional as soon as possible: allergic reactions like skin rash, itching or hives, swelling of the face, lips, or tongue breathing problems fever, infection mouth sores unusual bleeding or bruising unusually weak or tired Side effects that usually do not require medical attention (report to your doctor or health care professional if they continue or are bothersome): constipation or diarrhea loss of appetite nausea, vomiting This list may not describe all possible side effects. Call your doctor for medical advice about side effects. You may report side effects to FDA at 1-800-FDA-1088. Where should I keep my medication? This drug is given in a hospital or clinic and will not be stored at home. NOTE: This sheet is a summary. It may not cover all possible information. If you have questions about this medicine, talk to  your doctor, pharmacist, or health care provider.  2022 Elsevier/Gold Standard (2008-03-15 16:50:29)  Fluorouracil, 5-FU injection What is this medication? FLUOROURACIL, 5-FU (flure oh YOOR a sil) is a chemotherapy drug. It slows the growth of cancer cells. This medicine is used to treat many types of cancer like breast cancer, colon or rectal cancer, pancreatic cancer, and stomach cancer. This medicine may be used for other purposes; ask your health care provider or pharmacist if you have questions. COMMON BRAND NAME(S): Adrucil What should I tell my care team before I take this medication? They need to know if you have any of these conditions: blood disorders dihydropyrimidine dehydrogenase (DPD) deficiency infection (especially a virus infection such as chickenpox, cold sores, or herpes) kidney disease liver disease malnourished, poor nutrition recent or ongoing radiation therapy an unusual or allergic reaction to fluorouracil, other chemotherapy, other medicines, foods, dyes, or preservatives pregnant or trying to get pregnant breast-feeding How should  I use this medication? This drug is given as an infusion or injection into a vein. It is administered in a hospital or clinic by a specially trained health care professional. Talk to your pediatrician regarding the use of this medicine in children. Special care may be needed. Overdosage: If you think you have taken too much of this medicine contact a poison control center or emergency room at once. NOTE: This medicine is only for you. Do not share this medicine with others. What if I miss a dose? It is important not to miss your dose. Call your doctor or health care professional if you are unable to keep an appointment. What may interact with this medication? Do not take this medicine with any of the following medications: live virus vaccines This medicine may also interact with the following medications: medicines that treat or  prevent blood clots like warfarin, enoxaparin, and dalteparin This list may not describe all possible interactions. Give your health care provider a list of all the medicines, herbs, non-prescription drugs, or dietary supplements you use. Also tell them if you smoke, drink alcohol, or use illegal drugs. Some items may interact with your medicine. What should I watch for while using this medication? Visit your doctor for checks on your progress. This drug may make you feel generally unwell. This is not uncommon, as chemotherapy can affect healthy cells as well as cancer cells. Report any side effects. Continue your course of treatment even though you feel ill unless your doctor tells you to stop. In some cases, you may be given additional medicines to help with side effects. Follow all directions for their use. Call your doctor or health care professional for advice if you get a fever, chills or sore throat, or other symptoms of a cold or flu. Do not treat yourself. This drug decreases your body's ability to fight infections. Try to avoid being around people who are sick. This medicine may increase your risk to bruise or bleed. Call your doctor or health care professional if you notice any unusual bleeding. Be careful brushing and flossing your teeth or using a toothpick because you may get an infection or bleed more easily. If you have any dental work done, tell your dentist you are receiving this medicine. Avoid taking products that contain aspirin, acetaminophen, ibuprofen, naproxen, or ketoprofen unless instructed by your doctor. These medicines may hide a fever. Do not become pregnant while taking this medicine. Women should inform their doctor if they wish to become pregnant or think they might be pregnant. There is a potential for serious side effects to an unborn child. Talk to your health care professional or pharmacist for more information. Do not breast-feed an infant while taking this  medicine. Men should inform their doctor if they wish to father a child. This medicine may lower sperm counts. Do not treat diarrhea with over the counter products. Contact your doctor if you have diarrhea that lasts more than 2 days or if it is severe and watery. This medicine can make you more sensitive to the sun. Keep out of the sun. If you cannot avoid being in the sun, wear protective clothing and use sunscreen. Do not use sun lamps or tanning beds/booths. What side effects may I notice from receiving this medication? Side effects that you should report to your doctor or health care professional as soon as possible: allergic reactions like skin rash, itching or hives, swelling of the face, lips, or tongue low blood counts - this  medicine may decrease the number of white blood cells, red blood cells and platelets. You may be at increased risk for infections and bleeding. signs of infection - fever or chills, cough, sore throat, pain or difficulty passing urine signs of decreased platelets or bleeding - bruising, pinpoint red spots on the skin, black, tarry stools, blood in the urine signs of decreased red blood cells - unusually weak or tired, fainting spells, lightheadedness breathing problems changes in vision chest pain mouth sores nausea and vomiting pain, swelling, redness at site where injected pain, tingling, numbness in the hands or feet redness, swelling, or sores on hands or feet stomach pain unusual bleeding Side effects that usually do not require medical attention (report to your doctor or health care professional if they continue or are bothersome): changes in finger or toe nails diarrhea dry or itchy skin hair loss headache loss of appetite sensitivity of eyes to the light stomach upset unusually teary eyes This list may not describe all possible side effects. Call your doctor for medical advice about side effects. You may report side effects to FDA at  1-800-FDA-1088. Where should I keep my medication? This drug is given in a hospital or clinic and will not be stored at home. NOTE: This sheet is a summary. It may not cover all possible information. If you have questions about this medicine, talk to your doctor, pharmacist, or health care provider.  2022 Elsevier/Gold Standard (2019-08-10 15:00:03)

## 2021-06-12 NOTE — Progress Notes (Signed)
Patient presents for treatment. RN assessment completed along with the following:  Treatment Conditions (labs/vitals/weight) reviewed - Yes, and within treatment parameters.   Oncology Treatment Attestation completed for current therapy- Yes, on date 06/05/21 Informed consent completed and reflects current therapy/intent - Yes, on date 06/12/21             Provider progress note reviewed - Yes, today's provider note was reviewed. Treatment/Antibody/Supportive plan reviewed Yes, and there are no adjustments needed for today's treatment."} S&H and other orders reviewed - Yes, and there are no additional orders identified. Previous treatment date reviewed - Yes, and the appropriate amount of time has elapsed between treatments. Clinic Hand Off Received from - [facility]  Patient to proceed with treatment.

## 2021-06-12 NOTE — Progress Notes (Addendum)
  [facility] OFFICE PROGRESS NOTE   Diagnosis: Rectal cancer  INTERVAL HISTORY:   Mr. Marinaro returns as scheduled.  No pre-existing neuropathy symptoms.  No nausea or vomiting.  No diarrhea.  Appetite is improving.  He reports good fluid intake.  Objective:  Vital signs in last 24 hours:  Blood pressure (!) 152/76, pulse 79, temperature 98.1 F (36.7 C), temperature source Oral, resp. rate 19, height $RemoveBe'5\' 9"'ghVXjnHAO$  (1.753 m), weight 175 lb 12.8 oz (79.7 kg), SpO2 99 %.    HEENT: Mild white coating over tongue.  No buccal thrush. Resp: Lungs clear bilaterally. Cardio: Regular rate and rhythm. GI: Abdomen soft and nontender.  No hepatomegaly. Vascular: No leg edema. Skin: Palms without erythema. Port-A-Cath without erythema.   Lab Results:  Lab Results  Component Value Date   WBC 7.8 06/08/2021   HGB 13.7 06/08/2021   HCT 41.1 06/08/2021   MCV 80.7 06/08/2021   PLT 324 06/08/2021   NEUTROABS 6.5 06/08/2021    Imaging:  No results found.  Medications: I have reviewed the patient's current medications.  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; foundation 1-microsatellite stable, tumor mutation burden 5, K-rasG 12V, NRAS wildtype Colonoscopy 05/22/2021-ulcerated partially obstructing mass at 15 cm from anal verge CTs 05/30/2021-numerous small pulmonary nodules concerning for metastases, thickening of the rectum, multiple liver metastases Cycle 1 FOLFOX 06/12/2021 Prostate cancer-simple prostatectomy January 2019, Gleason 3+3, 10 to 12% of specimen Active surveillance, biopsy May 2019 with small focus of Gleason 3+3 disease in 1/6 cores,  surveillance continued Elevated PSA 04/20/2020 Biopsy 06/19/2020 8/12 cores positive, Gleason 4+4 PET scan 07/07/2020-negative CT 07/07/2020 right iliac and retrocaval adenopathy, 1.3 cm subcapsular right lower pole renal lesion Androgen deprivation therapy beginning 08/21/2020 Radiation to prostate, seminal vesicles, and pelvic lymph nodes to 10/22 - 12/28/2020 He continues every 15-month Firmagon and daily Xtandi   3.  Right renal mass consistent with a renal cell carcinoma-stable on MRI abdomen 8/3/20224.  4.  Mitral valve prolapse 5.  Family history of prostate cancer  Disposition: Mr. Savoca appears stable.  He is scheduled for cycle 1 FOLFOX today.  We again reviewed potential toxicities.  He agrees to proceed.  Labs from 06/08/2021 reviewed, adequate for treatment.  He will return for lab, follow-up, cycle 2 FOLFOX in 2 weeks.  He will contact the office in the interim with any problems.    Ned Card ANP/GNP-BC   06/12/2021  8:48 AM

## 2021-06-13 ENCOUNTER — Telehealth: Payer: Medicare Other | Admitting: Nutrition

## 2021-06-13 ENCOUNTER — Encounter: Payer: Self-pay | Admitting: *Deleted

## 2021-06-14 ENCOUNTER — Encounter (HOSPITAL_COMMUNITY): Payer: Self-pay

## 2021-06-14 ENCOUNTER — Inpatient Hospital Stay: Payer: Medicare Other

## 2021-06-14 ENCOUNTER — Other Ambulatory Visit: Payer: Self-pay

## 2021-06-14 VITALS — BP 145/75 | HR 74 | Temp 98.6°F | Resp 20

## 2021-06-14 DIAGNOSIS — C20 Malignant neoplasm of rectum: Secondary | ICD-10-CM

## 2021-06-14 DIAGNOSIS — Z5111 Encounter for antineoplastic chemotherapy: Secondary | ICD-10-CM | POA: Diagnosis not present

## 2021-06-14 MED ORDER — SODIUM CHLORIDE 0.9% FLUSH
10.0000 mL | INTRAVENOUS | Status: DC | PRN
  Administered 2021-06-14: 10 mL

## 2021-06-14 MED ORDER — HEPARIN SOD (PORK) LOCK FLUSH 100 UNIT/ML IV SOLN
500.0000 [IU] | Freq: Once | INTRAVENOUS | Status: AC | PRN
  Administered 2021-06-14: 500 [IU]

## 2021-06-20 ENCOUNTER — Telehealth: Payer: Self-pay | Admitting: Nutrition

## 2021-06-20 ENCOUNTER — Inpatient Hospital Stay: Payer: Medicare Other | Admitting: Nutrition

## 2021-06-20 NOTE — Telephone Encounter (Signed)
Telephone consult completed with patient and wife.  80 year old male diagnosed with Rectal cancer receiving FOLFOX s/p 1 cycle.  PMH includes Htn, Prostate Cancer, s/p XRT, and Right Renal mass.  Medications include Zofran and Compazine.  Labs include Glucose 128 and Alb 4.2 on Sept 16.  Height: 69 ". Weight: 175 pounds on Sept 20. UBW: 193 pounds Nov 2021, 188 pounds March, 2022. BMI: 25.84.  Patient reports "quite a few" BMs daily (5-6 daily) This impacts his QOL. He sometimes has abdominal cramping which tend to make him think he could vomit. Reports increased burping. He forces himself to eat. He has tried to cut out all sugar and grains at the advice of his chiropractor. He has taste alterations; foods taste like metal. He drinks 2 bottles of Enterade daily.  Nutrition Diagnosis: Unintended wt loss related to Rectal cancer and associated treatments as evidenced by 9 % wt loss from UBW and 7% wt loss over 9 months which is not significant.  Intervention: Educated to eat small, frequent meals with 3 snacks daily. Educated on strategies to improve appetite and increase calories and protein. Reviewed evidenced-based information on sugar and cancer and gluten free diet. Continue Enterade BID. Take immodium per MD for diarrhea. Educated on taste alterations. Fact sheets mailed to patient along with name and phone number for questions.  Monitoring, Evaluation, Goals: Patient will tolerate increased calories and protein to minimize wt loss and improve nutrition related side effects.  Next Visit: Patient will call with questions.

## 2021-06-20 NOTE — Progress Notes (Signed)
See telephone note.

## 2021-06-24 ENCOUNTER — Other Ambulatory Visit: Payer: Self-pay | Admitting: Oncology

## 2021-06-26 ENCOUNTER — Inpatient Hospital Stay: Payer: Medicare Other

## 2021-06-26 ENCOUNTER — Encounter: Payer: Self-pay | Admitting: *Deleted

## 2021-06-26 ENCOUNTER — Inpatient Hospital Stay (HOSPITAL_BASED_OUTPATIENT_CLINIC_OR_DEPARTMENT_OTHER): Payer: Medicare Other | Admitting: Oncology

## 2021-06-26 ENCOUNTER — Telehealth: Payer: Self-pay

## 2021-06-26 ENCOUNTER — Inpatient Hospital Stay: Payer: Medicare Other | Attending: Nurse Practitioner

## 2021-06-26 ENCOUNTER — Encounter: Payer: Self-pay | Admitting: Oncology

## 2021-06-26 ENCOUNTER — Other Ambulatory Visit: Payer: Self-pay

## 2021-06-26 VITALS — BP 152/77 | HR 71 | Temp 98.7°F | Resp 18 | Ht 69.0 in | Wt 170.4 lb

## 2021-06-26 DIAGNOSIS — K5900 Constipation, unspecified: Secondary | ICD-10-CM | POA: Diagnosis not present

## 2021-06-26 DIAGNOSIS — C787 Secondary malignant neoplasm of liver and intrahepatic bile duct: Secondary | ICD-10-CM | POA: Insufficient documentation

## 2021-06-26 DIAGNOSIS — C20 Malignant neoplasm of rectum: Secondary | ICD-10-CM

## 2021-06-26 DIAGNOSIS — C61 Malignant neoplasm of prostate: Secondary | ICD-10-CM | POA: Insufficient documentation

## 2021-06-26 DIAGNOSIS — Z8546 Personal history of malignant neoplasm of prostate: Secondary | ICD-10-CM | POA: Diagnosis not present

## 2021-06-26 DIAGNOSIS — N2889 Other specified disorders of kidney and ureter: Secondary | ICD-10-CM | POA: Diagnosis not present

## 2021-06-26 DIAGNOSIS — Z5111 Encounter for antineoplastic chemotherapy: Secondary | ICD-10-CM | POA: Diagnosis not present

## 2021-06-26 DIAGNOSIS — I341 Nonrheumatic mitral (valve) prolapse: Secondary | ICD-10-CM | POA: Insufficient documentation

## 2021-06-26 DIAGNOSIS — Z923 Personal history of irradiation: Secondary | ICD-10-CM | POA: Insufficient documentation

## 2021-06-26 LAB — CBC WITH DIFFERENTIAL (CANCER CENTER ONLY)
Abs Immature Granulocytes: 0.01 10*3/uL (ref 0.00–0.07)
Basophils Absolute: 0 10*3/uL (ref 0.0–0.1)
Basophils Relative: 0 %
Eosinophils Absolute: 0.1 10*3/uL (ref 0.0–0.5)
Eosinophils Relative: 3 %
HCT: 34.6 % — ABNORMAL LOW (ref 39.0–52.0)
Hemoglobin: 11.7 g/dL — ABNORMAL LOW (ref 13.0–17.0)
Immature Granulocytes: 0 %
Lymphocytes Relative: 11 %
Lymphs Abs: 0.5 10*3/uL — ABNORMAL LOW (ref 0.7–4.0)
MCH: 26.9 pg (ref 26.0–34.0)
MCHC: 33.8 g/dL (ref 30.0–36.0)
MCV: 79.5 fL — ABNORMAL LOW (ref 80.0–100.0)
Monocytes Absolute: 0.4 10*3/uL (ref 0.1–1.0)
Monocytes Relative: 8 %
Neutro Abs: 3.4 10*3/uL (ref 1.7–7.7)
Neutrophils Relative %: 78 %
Platelet Count: 211 10*3/uL (ref 150–400)
RBC: 4.35 MIL/uL (ref 4.22–5.81)
RDW: 15.9 % — ABNORMAL HIGH (ref 11.5–15.5)
WBC Count: 4.4 10*3/uL (ref 4.0–10.5)
nRBC: 0 % (ref 0.0–0.2)

## 2021-06-26 LAB — CMP (CANCER CENTER ONLY)
ALT: 10 U/L (ref 0–44)
AST: 13 U/L — ABNORMAL LOW (ref 15–41)
Albumin: 3.8 g/dL (ref 3.5–5.0)
Alkaline Phosphatase: 81 U/L (ref 38–126)
Anion gap: 9 (ref 5–15)
BUN: 14 mg/dL (ref 8–23)
CO2: 24 mmol/L (ref 22–32)
Calcium: 9.4 mg/dL (ref 8.9–10.3)
Chloride: 108 mmol/L (ref 98–111)
Creatinine: 0.95 mg/dL (ref 0.61–1.24)
GFR, Estimated: >60 mL/min (ref >60)
Glucose, Bld: 119 mg/dL — ABNORMAL HIGH (ref 70–99)
Potassium: 3.6 mmol/L (ref 3.5–5.1)
Sodium: 141 mmol/L (ref 135–145)
Total Bilirubin: 0.7 mg/dL (ref 0.3–1.2)
Total Protein: 6 g/dL — ABNORMAL LOW (ref 6.5–8.1)

## 2021-06-26 MED ORDER — DEXTROSE 5 % IV SOLN: Freq: Once | INTRAVENOUS | Status: AC

## 2021-06-26 MED ORDER — OXALIPLATIN CHEMO INJECTION 100 MG/20ML
85.0000 mg/m2 | Freq: Once | INTRAVENOUS | Status: AC
  Administered 2021-06-26: 165 mg via INTRAVENOUS
  Filled 2021-06-26: qty 33

## 2021-06-26 MED ORDER — LEUCOVORIN CALCIUM INJECTION 350 MG
400.0000 mg/m2 | Freq: Once | INTRAVENOUS | Status: AC
  Administered 2021-06-26: 788 mg via INTRAVENOUS
  Filled 2021-06-26: qty 39.4

## 2021-06-26 MED ORDER — PALONOSETRON HCL INJECTION 0.25 MG/5ML
0.2500 mg | Freq: Once | INTRAVENOUS | Status: AC
  Administered 2021-06-26: 0.25 mg via INTRAVENOUS
  Filled 2021-06-26: qty 5

## 2021-06-26 MED ORDER — SODIUM CHLORIDE 0.9 % IV SOLN
10.0000 mg | Freq: Once | INTRAVENOUS | Status: AC
  Administered 2021-06-26: 10 mg via INTRAVENOUS
  Filled 2021-06-26: qty 1

## 2021-06-26 MED ORDER — SODIUM CHLORIDE 0.9 % IV SOLN
2000.0000 mg/m2 | INTRAVENOUS | Status: DC
  Administered 2021-06-26: 3950 mg via INTRAVENOUS
  Filled 2021-06-26: qty 79

## 2021-06-26 MED ORDER — FLUOROURACIL CHEMO INJECTION 2.5 GM/50ML
400.0000 mg/m2 | Freq: Once | INTRAVENOUS | Status: AC
  Administered 2021-06-26: 800 mg via INTRAVENOUS
  Filled 2021-06-26: qty 16

## 2021-06-26 NOTE — Patient Instructions (Addendum)
The chemotherapy medication bag should finish at 46 hours, 96 hours, or 7 days. For example, if your pump is scheduled for 46 hours and it was put on at 4:00 p.m., it should finish at 2:00 p.m. the day it is scheduled to come off regardless of your appointment time.     Estimated time to finish at 11:00 Thursday October 6.   If the display on your pump reads "Low Volume" and it is beeping, take the batteries out of the pump and come to the cancer center for it to be taken off.   If the pump alarms go off prior to the pump reading "Low Volume" then call [phone] and someone can assist you.  If the plunger comes out and the chemotherapy medication is leaking out, please use your home chemo spill kit to clean up the spill. Do NOT use paper towels or other household products.  If you have problems or questions regarding your pump, please call either [phone] (24 hours a day) or the cancer center Monday-Friday 8:00 a.m.- 4:30 p.m. at the clinic number and we will assist you. If you are unable to get assistance, then go to the nearest Emergency Department and ask the staff to contact the IV team for assistance.   [facility]  Discharge Instructions: Thank you for choosing [facility] to provide your oncology and hematology care.   If you have a lab appointment with the [facility], please go directly to the [facility] and check in at the registration area.   Wear comfortable clothing and clothing appropriate for easy access to any Portacath or PICC line.   We strive to give you quality time with your provider. You may need to reschedule your appointment if you arrive late (15 or more minutes).  Arriving late affects you and other patients whose appointments are after yours.  Also, if you miss three or more appointments without notifying the office, you may be dismissed from the clinic at the provider's discretion.      For prescription refill  requests, have your pharmacy contact our office and allow 72 hours for refills to be completed.    Today you received the following chemotherapy and/or immunotherapy agents oxaliplatin, leucovorin, fluorouracil   To help prevent nausea and vomiting after your treatment, we encourage you to take your nausea medication as directed.  BELOW ARE SYMPTOMS THAT SHOULD BE REPORTED IMMEDIATELY: *FEVER GREATER THAN 100.4 F (38 C) OR HIGHER *CHILLS OR SWEATING *NAUSEA AND VOMITING THAT IS NOT CONTROLLED WITH YOUR NAUSEA MEDICATION *UNUSUAL SHORTNESS OF BREATH *UNUSUAL BRUISING OR BLEEDING *URINARY PROBLEMS (pain or burning when urinating, or frequent urination) *BOWEL PROBLEMS (unusual diarrhea, constipation, pain near the anus) TENDERNESS IN MOUTH AND THROAT WITH OR WITHOUT PRESENCE OF ULCERS (sore throat, sores in mouth, or a toothache) UNUSUAL RASH, SWELLING OR PAIN  UNUSUAL VAGINAL DISCHARGE OR ITCHING   Items with * indicate a potential emergency and should be followed up as soon as possible or go to the Emergency Department if any problems should occur.  Please show the CHEMOTHERAPY ALERT CARD or IMMUNOTHERAPY ALERT CARD at check-in to the Emergency Department and triage nurse.  Should you have questions after your visit or need to cancel or reschedule your appointment, please contact [facility]  Dept: [phone]  and follow the prompts.  Office hours are 8:00 a.m. to 4:30 p.m. Monday - Friday. Please note that voicemails left after 4:00 p.m.  may not be returned until the following business day.  We are closed weekends and major holidays. You have access to a nurse at all times for urgent questions. Please call the main number to the clinic Dept: [phone] and follow the prompts.   For any non-urgent questions, you may also contact your provider using MyChart. We now offer e-Visits for anyone 59 and older to request care online for non-urgent symptoms. For  details visit [email or web address].   Also download the MyChart app! Go to the app store, search "MyChart", open the app, select [facility], and log in with your MyChart username and password.  Due to Covid, a mask is required upon entering the hospital/clinic. If you do not have a mask, one will be given to you upon arrival. For doctor visits, patients may have 1 support person aged 58 or older with them. For treatment visits, patients cannot have anyone with them due to current Covid guidelines and our immunocompromised population.   Oxaliplatin Injection What is this medication? OXALIPLATIN (ox AL i PLA tin) is a chemotherapy drug. It targets fast dividing cells, like cancer cells, and causes these cells to die. This medicine is used to treat cancers of the colon and rectum, and many other cancers. This medicine may be used for other purposes; ask your health care provider or pharmacist if you have questions. COMMON BRAND NAME(S): Eloxatin What should I tell my care team before I take this medication? They need to know if you have any of these conditions: heart disease history of irregular heartbeat liver disease low blood counts, like white cells, platelets, or red blood cells lung or breathing disease, like asthma take medicines that treat or prevent blood clots tingling of the fingers or toes, or other nerve disorder an unusual or allergic reaction to oxaliplatin, other chemotherapy, other medicines, foods, dyes, or preservatives pregnant or trying to get pregnant breast-feeding How should I use this medication? This drug is given as an infusion into a vein. It is administered in a hospital or clinic by a specially trained health care professional. Talk to your pediatrician regarding the use of this medicine in children. Special care may be needed. Overdosage: If you think you have taken too much of this medicine contact a poison control center or emergency room at once. NOTE:  This medicine is only for you. Do not share this medicine with others. What if I miss a dose? It is important not to miss a dose. Call your doctor or health care professional if you are unable to keep an appointment. What may interact with this medication? Do not take this medicine with any of the following medications: cisapride dronedarone pimozide thioridazine This medicine may also interact with the following medications: aspirin and aspirin-like medicines certain medicines that treat or prevent blood clots like warfarin, apixaban, dabigatran, and rivaroxaban cisplatin cyclosporine diuretics medicines for infection like acyclovir, adefovir, amphotericin B, bacitracin, cidofovir, foscarnet, ganciclovir, gentamicin, pentamidine, vancomycin NSAIDs, medicines for pain and inflammation, like ibuprofen or naproxen other medicines that prolong the QT interval (an abnormal heart rhythm) pamidronate zoledronic acid This list may not describe all possible interactions. Give your health care provider a list of all the medicines, herbs, non-prescription drugs, or dietary supplements you use. Also tell them if you smoke, drink alcohol, or use illegal drugs. Some items may interact with your medicine. What should I watch for while using this medication? Your condition will be monitored carefully while you are receiving this medicine. You may need  blood work done while you are taking this medicine. This medicine may make you feel generally unwell. This is not uncommon as chemotherapy can affect healthy cells as well as cancer cells. Report any side effects. Continue your course of treatment even though you feel ill unless your healthcare professional tells you to stop. This medicine can make you more sensitive to cold. Do not drink cold drinks or use ice. Cover exposed skin before coming in contact with cold temperatures or cold objects. When out in cold weather wear warm clothing and cover your mouth  and nose to warm the air that goes into your lungs. Tell your doctor if you get sensitive to the cold. Do not become pregnant while taking this medicine or for 9 months after stopping it. Women should inform their health care professional if they wish to become pregnant or think they might be pregnant. Men should not father a child while taking this medicine and for 6 months after stopping it. There is potential for serious side effects to an unborn child. Talk to your health care professional for more information. Do not breast-feed a child while taking this medicine or for 3 months after stopping it. This medicine has caused ovarian failure in some women. This medicine may make it more difficult to get pregnant. Talk to your health care professional if you are concerned about your fertility. This medicine has caused decreased sperm counts in some men. This may make it more difficult to father a child. Talk to your health care professional if you are concerned about your fertility. This medicine may increase your risk of getting an infection. Call your health care professional for advice if you get a fever, chills, or sore throat, or other symptoms of a cold or flu. Do not treat yourself. Try to avoid being around people who are sick. Avoid taking medicines that contain aspirin, acetaminophen, ibuprofen, naproxen, or ketoprofen unless instructed by your health care professional. These medicines may hide a fever. Be careful brushing or flossing your teeth or using a toothpick because you may get an infection or bleed more easily. If you have any dental work done, tell your dentist you are receiving this medicine. What side effects may I notice from receiving this medication? Side effects that you should report to your doctor or health care professional as soon as possible: allergic reactions like skin rash, itching or hives, swelling of the face, lips, or tongue breathing problems cough low blood counts  - this medicine may decrease the number of white blood cells, red blood cells, and platelets. You may be at increased risk for infections and bleeding nausea, vomiting pain, redness, or irritation at site where injected pain, tingling, numbness in the hands or feet signs and symptoms of bleeding such as bloody or black, tarry stools; red or dark brown urine; spitting up blood or brown material that looks like coffee grounds; red spots on the skin; unusual bruising or bleeding from the eyes, gums, or nose signs and symptoms of a dangerous change in heartbeat or heart rhythm like chest pain; dizziness; fast, irregular heartbeat; palpitations; feeling faint or lightheaded; falls signs and symptoms of infection like fever; chills; cough; sore throat; pain or trouble passing urine signs and symptoms of liver injury like dark yellow or brown urine; general ill feeling or flu-like symptoms; light-colored stools; loss of appetite; nausea; right upper belly pain; unusually weak or tired; yellowing of the eyes or skin signs and symptoms of low red blood cells  or anemia such as unusually weak or tired; feeling faint or lightheaded; falls signs and symptoms of muscle injury like dark urine; trouble passing urine or change in the amount of urine; unusually weak or tired; muscle pain; back pain Side effects that usually do not require medical attention (report to your doctor or health care professional if they continue or are bothersome): changes in taste diarrhea gas hair loss loss of appetite mouth sores This list may not describe all possible side effects. Call your doctor for medical advice about side effects. You may report side effects to FDA at 1-800-FDA-1088. Where should I keep my medication? This drug is given in a hospital or clinic and will not be stored at home. NOTE: This sheet is a summary. It may not cover all possible information. If you have questions about this medicine, talk to your doctor,  pharmacist, or health care provider.  2022 Elsevier/Gold Standard (2019-01-27 12:20:35)  Leucovorin injection What is this medication? LEUCOVORIN (loo koe VOR in) is used to prevent or treat the harmful effects of some medicines. This medicine is used to treat anemia caused by a low amount of folic acid in the body. It is also used with 5-fluorouracil (5-FU) to treat colon cancer. This medicine may be used for other purposes; ask your health care provider or pharmacist if you have questions. What should I tell my care team before I take this medication? They need to know if you have any of these conditions: anemia from low levels of vitamin B-12 in the blood an unusual or allergic reaction to leucovorin, folic acid, other medicines, foods, dyes, or preservatives pregnant or trying to get pregnant breast-feeding How should I use this medication? This medicine is for injection into a muscle or into a vein. It is given by a health care professional in a hospital or clinic setting. Talk to your pediatrician regarding the use of this medicine in children. Special care may be needed. Overdosage: If you think you have taken too much of this medicine contact a poison control center or emergency room at once. NOTE: This medicine is only for you. Do not share this medicine with others. What if I miss a dose? This does not apply. What may interact with this medication? capecitabine fluorouracil phenobarbital phenytoin primidone trimethoprim-sulfamethoxazole This list may not describe all possible interactions. Give your health care provider a list of all the medicines, herbs, non-prescription drugs, or dietary supplements you use. Also tell them if you smoke, drink alcohol, or use illegal drugs. Some items may interact with your medicine. What should I watch for while using this medication? Your condition will be monitored carefully while you are receiving this medicine. This medicine may  increase the side effects of 5-fluorouracil, 5-FU. Tell your doctor or health care professional if you have diarrhea or mouth sores that do not get better or that get worse. What side effects may I notice from receiving this medication? Side effects that you should report to your doctor or health care professional as soon as possible: allergic reactions like skin rash, itching or hives, swelling of the face, lips, or tongue breathing problems fever, infection mouth sores unusual bleeding or bruising unusually weak or tired Side effects that usually do not require medical attention (report to your doctor or health care professional if they continue or are bothersome): constipation or diarrhea loss of appetite nausea, vomiting This list may not describe all possible side effects. Call your doctor for medical advice about side effects.  You may report side effects to FDA at 1-800-FDA-1088. Where should I keep my medication? This drug is given in a hospital or clinic and will not be stored at home. NOTE: This sheet is a summary. It may not cover all possible information. If you have questions about this medicine, talk to your doctor, pharmacist, or health care provider.  2022 Elsevier/Gold Standard (2008-03-15 16:50:29)  Fluorouracil, 5-FU injection What is this medication? FLUOROURACIL, 5-FU (flure oh YOOR a sil) is a chemotherapy drug. It slows the growth of cancer cells. This medicine is used to treat many types of cancer like breast cancer, colon or rectal cancer, pancreatic cancer, and stomach cancer. This medicine may be used for other purposes; ask your health care provider or pharmacist if you have questions. COMMON BRAND NAME(S): Adrucil What should I tell my care team before I take this medication? They need to know if you have any of these conditions: blood disorders dihydropyrimidine dehydrogenase (DPD) deficiency infection (especially a virus infection such as chickenpox, cold  sores, or herpes) kidney disease liver disease malnourished, poor nutrition recent or ongoing radiation therapy an unusual or allergic reaction to fluorouracil, other chemotherapy, other medicines, foods, dyes, or preservatives pregnant or trying to get pregnant breast-feeding How should I use this medication? This drug is given as an infusion or injection into a vein. It is administered in a hospital or clinic by a specially trained health care professional. Talk to your pediatrician regarding the use of this medicine in children. Special care may be needed. Overdosage: If you think you have taken too much of this medicine contact a poison control center or emergency room at once. NOTE: This medicine is only for you. Do not share this medicine with others. What if I miss a dose? It is important not to miss your dose. Call your doctor or health care professional if you are unable to keep an appointment. What may interact with this medication? Do not take this medicine with any of the following medications: live virus vaccines This medicine may also interact with the following medications: medicines that treat or prevent blood clots like warfarin, enoxaparin, and dalteparin This list may not describe all possible interactions. Give your health care provider a list of all the medicines, herbs, non-prescription drugs, or dietary supplements you use. Also tell them if you smoke, drink alcohol, or use illegal drugs. Some items may interact with your medicine. What should I watch for while using this medication? Visit your doctor for checks on your progress. This drug may make you feel generally unwell. This is not uncommon, as chemotherapy can affect healthy cells as well as cancer cells. Report any side effects. Continue your course of treatment even though you feel ill unless your doctor tells you to stop. In some cases, you may be given additional medicines to help with side effects. Follow all  directions for their use. Call your doctor or health care professional for advice if you get a fever, chills or sore throat, or other symptoms of a cold or flu. Do not treat yourself. This drug decreases your body's ability to fight infections. Try to avoid being around people who are sick. This medicine may increase your risk to bruise or bleed. Call your doctor or health care professional if you notice any unusual bleeding. Be careful brushing and flossing your teeth or using a toothpick because you may get an infection or bleed more easily. If you have any dental work done, tell your dentist you  are receiving this medicine. Avoid taking products that contain aspirin, acetaminophen, ibuprofen, naproxen, or ketoprofen unless instructed by your doctor. These medicines may hide a fever. Do not become pregnant while taking this medicine. Women should inform their doctor if they wish to become pregnant or think they might be pregnant. There is a potential for serious side effects to an unborn child. Talk to your health care professional or pharmacist for more information. Do not breast-feed an infant while taking this medicine. Men should inform their doctor if they wish to father a child. This medicine may lower sperm counts. Do not treat diarrhea with over the counter products. Contact your doctor if you have diarrhea that lasts more than 2 days or if it is severe and watery. This medicine can make you more sensitive to the sun. Keep out of the sun. If you cannot avoid being in the sun, wear protective clothing and use sunscreen. Do not use sun lamps or tanning beds/booths. What side effects may I notice from receiving this medication? Side effects that you should report to your doctor or health care professional as soon as possible: allergic reactions like skin rash, itching or hives, swelling of the face, lips, or tongue low blood counts - this medicine may decrease the number of white blood cells, red  blood cells and platelets. You may be at increased risk for infections and bleeding. signs of infection - fever or chills, cough, sore throat, pain or difficulty passing urine signs of decreased platelets or bleeding - bruising, pinpoint red spots on the skin, black, tarry stools, blood in the urine signs of decreased red blood cells - unusually weak or tired, fainting spells, lightheadedness breathing problems changes in vision chest pain mouth sores nausea and vomiting pain, swelling, redness at site where injected pain, tingling, numbness in the hands or feet redness, swelling, or sores on hands or feet stomach pain unusual bleeding Side effects that usually do not require medical attention (report to your doctor or health care professional if they continue or are bothersome): changes in finger or toe nails diarrhea dry or itchy skin hair loss headache loss of appetite sensitivity of eyes to the light stomach upset unusually teary eyes This list may not describe all possible side effects. Call your doctor for medical advice about side effects. You may report side effects to FDA at 1-800-FDA-1088. Where should I keep my medication? This drug is given in a hospital or clinic and will not be stored at home. NOTE: This sheet is a summary. It may not cover all possible information. If you have questions about this medicine, talk to your doctor, pharmacist, or health care provider.  2022 Elsevier/Gold Standard (2019-08-10 15:00:03)

## 2021-06-26 NOTE — Progress Notes (Signed)
PATIENT NAVIGATOR PROGRESS NOTE  Name: Neil Perry Date: 06/26/2021 MRN: [id number]  DOB: 1940-12-22   Reason for visit:  F/U after first treatment cycle  Comments:  Met with Neil Perry as he begins his 2nd cycle. Tolerated first cycle well, appetite has improved and "I actually feel hungry". Discussed keeping bowel movements soft so that he is not straining. Discussed with Dr [name] today and he directed pt to add in Colace twice a day. Encouraged fluid intake and eating small amounts every 3-4 hours.     Time spent counseling/coordinating care: 15-30 minutes

## 2021-06-26 NOTE — Telephone Encounter (Signed)
Patient seen by Dr. Sherrill today ? ?Vitals are within treatment parameters. ? ?Labs reviewed by Dr. Sherrill and are within treatment parameters. ? ?Per physician team, patient is ready for treatment and there are NO modifications to the treatment plan.  ?

## 2021-06-26 NOTE — Progress Notes (Signed)
  [facility] OFFICE PROGRESS NOTE   Diagnosis: Rectal cancer  INTERVAL HISTORY:   Neil Perry completed cycle 1 FOLFOX on 06/12/2021.  No nausea/vomiting, mouth sores, or diarrhea.  He reports an improved appetite.  He has metallic taste.  He has constipation.  Rectal bleeding has resolved.  He reports 1 episode of cold sensitivity when eating ice cream.  No other neuropathy symptoms.  Objective:  Vital signs in last 24 hours:  Blood pressure (!) 152/77, pulse 71, temperature 98.7 F (37.1 C), temperature source Oral, resp. rate 18, height $RemoveBe'5\' 9"'SnGaOVqmN$  (1.753 m), weight 170 lb 6.4 oz (77.3 kg), SpO2 100 %.    HEENT: No thrush or ulcers Resp: Lungs clear bilaterally Cardio: Regular rate and rhythm GI: Nontender, no hepatosplenomegaly Vascular: No leg edema  Skin: Palms without erythema  Portacath/PICC-without erythema  Lab Results:  Lab Results  Component Value Date   WBC 4.4 06/26/2021   HGB 11.7 (L) 06/26/2021   HCT 34.6 (L) 06/26/2021   MCV 79.5 (L) 06/26/2021   PLT 211 06/26/2021   NEUTROABS 3.4 06/26/2021    CMP  Lab Results  Component Value Date   NA 140 06/08/2021   K 4.3 06/08/2021   CL 105 06/08/2021   CO2 26 06/08/2021   GLUCOSE 128 (H) 06/08/2021   BUN 17 06/08/2021   CREATININE 1.20 06/08/2021   CALCIUM 9.8 06/08/2021   PROT 6.9 06/08/2021   ALBUMIN 4.2 06/08/2021   AST 14 (L) 06/08/2021   ALT 10 06/08/2021   ALKPHOS 98 06/08/2021   BILITOT 0.8 06/08/2021   GFRNONAA >60 06/08/2021    Lab Results  Component Value Date   CEA 344.43 (H) 06/08/2021     Medications: I have reviewed the patient's current medications.   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 2-3 new hypervascular masses in the liver dome, no abdominal lymphadenopathy, stable benign left adrenal adenoma, stable right lower pole kidney mass Ultrasound-guided biopsy of a liver lesion 05/07/2021-metastatic adenocarcinoma with extensive necrosis,  immunohistochemical profile consistent with a colorectal primary; foundation 1-microsatellite stable, tumor mutation burden 5, K-rasG 12V, NRAS wildtype Colonoscopy 05/22/2021-ulcerated partially obstructing mass at 15 cm from anal verge CTs 05/30/2021-numerous small pulmonary nodules concerning for metastases, thickening of the rectum, multiple liver metastases Cycle 1 FOLFOX 06/12/2021 Cycle 2 FOLFOX 06/26/2021 Prostate cancer-simple prostatectomy January 2019, Gleason 3+3, 10 to 12% of specimen Active surveillance, biopsy May 2019 with small focus of Gleason 3+3 disease in 1/6 cores, surveillance continued Elevated PSA 04/20/2020 Biopsy 06/19/2020 8/12 cores positive, Gleason 4+4 PET scan 07/07/2020-negative CT 07/07/2020 right iliac and retrocaval adenopathy, 1.3 cm subcapsular right lower pole renal lesion Androgen deprivation therapy beginning 08/21/2020 Radiation to prostate, seminal vesicles, and pelvic lymph nodes to 10/22 - 12/28/2020 He continues every 81-month Firmagon and daily Xtandi   3.  Right renal mass consistent with a renal cell carcinoma-stable on MRI abdomen 8/3/20224.  4.  Mitral valve prolapse 5.  Family history of prostate cancer    Disposition: [name] tolerated the first cycle of FOLFOX well.  He will complete cycle 2 today.  He will begin a stool softener for constipation.  He will return for an office visit and chemotherapy in 2 weeks.  He plans to obtain a COVID-19 booster vaccine.  Betsy Coder, MD  06/26/2021  8:57 AM

## 2021-06-26 NOTE — Progress Notes (Signed)
Patient presents for treatment. RN assessment completed along with the following:  Labs/vitals reviewed - Yes, and within treatment parameters.   Weight within 10% of previous measurement - Yes Oncology Treatment Attestation completed for current therapy- Yes, on date 06/05/21 Informed consent completed and reflects current therapy/intent - Yes, on date 06/15/21             Provider progress note reviewed - Yes, today's provider note was reviewed. Treatment/Antibody/Supportive plan reviewed - Yes, and there are no adjustments needed for today's treatment. S&H and other orders reviewed - Yes, and there are no additional orders identified. Previous treatment date reviewed - Yes, and the appropriate amount of time has elapsed between treatments. Clinic Hand Off Received from - no handoff received   Patient to proceed with treatment.

## 2021-06-28 ENCOUNTER — Telehealth: Payer: Self-pay

## 2021-06-28 ENCOUNTER — Other Ambulatory Visit (HOSPITAL_BASED_OUTPATIENT_CLINIC_OR_DEPARTMENT_OTHER): Payer: Self-pay

## 2021-06-28 ENCOUNTER — Inpatient Hospital Stay: Payer: Medicare Other

## 2021-06-28 ENCOUNTER — Encounter: Payer: Self-pay | Admitting: Oncology

## 2021-06-28 ENCOUNTER — Other Ambulatory Visit: Payer: Self-pay

## 2021-06-28 ENCOUNTER — Ambulatory Visit: Payer: Medicare Other | Attending: Internal Medicine

## 2021-06-28 VITALS — BP 151/74 | HR 68 | Temp 97.6°F | Resp 18

## 2021-06-28 DIAGNOSIS — C20 Malignant neoplasm of rectum: Secondary | ICD-10-CM

## 2021-06-28 DIAGNOSIS — Z5111 Encounter for antineoplastic chemotherapy: Secondary | ICD-10-CM | POA: Diagnosis not present

## 2021-06-28 DIAGNOSIS — Z23 Encounter for immunization: Secondary | ICD-10-CM

## 2021-06-28 MED ORDER — HEPARIN SOD (PORK) LOCK FLUSH 100 UNIT/ML IV SOLN
500.0000 [IU] | Freq: Once | INTRAVENOUS | Status: AC | PRN
  Administered 2021-06-28: 500 [IU]

## 2021-06-28 MED ORDER — PFIZER COVID-19 VAC BIVALENT 30 MCG/0.3ML IM SUSP
INTRAMUSCULAR | 0 refills | Status: DC
  Filled 2021-06-28: qty 0.3, 1d supply, fill #0

## 2021-06-28 MED ORDER — SODIUM CHLORIDE 0.9% FLUSH
10.0000 mL | INTRAVENOUS | Status: DC | PRN
  Administered 2021-06-28: 10 mL

## 2021-06-28 NOTE — Telephone Encounter (Signed)
Follow up call to Pt after pump removal today. Pt stated he had some periods where he had felt shaky. Pt stated he is feeling better now. Informed Pt that this could be from the steroids he received before his treatment. Pt verbalized understanding and just wanted to inform Dr [name]. Dr [name] aware.

## 2021-06-28 NOTE — Progress Notes (Signed)
   Covid-19 Vaccination Clinic  Name:  Daren Yeagle    MRN: [id number] DOB: [birth year]  06/28/2021  Mr. Hasegawa was observed post Covid-19 immunization for 15 minutes without incident. He was provided with Vaccine Information Sheet and instruction to access the V-Safe system.   Mr. Bembenek was instructed to call [phone] with any severe reactions post vaccine: Difficulty breathing  Swelling of face and throat  A fast heartbeat  A bad rash all over body  Dizziness and weakness

## 2021-06-28 NOTE — Patient Instructions (Signed)
Implanted Port Home Guide An implanted port is a device that is placed under the skin. It is usually placed in the chest. The device can be used to give IV medicine, to take blood, or for dialysis. You may have an implanted port if: You need IV medicine that would be irritating to the small veins in your hands or arms. You need IV medicines, such as antibiotics, for a long period of time. You need IV nutrition for a long period of time. You need dialysis. When you have a port, your health care provider can choose to use the port instead of veins in your arms for these procedures. You may have fewer limitations when using a port than you would if you used other types of long-term IVs, and you will likely be able to return to normal activities after your incision heals. An implanted port has two main parts: Reservoir. The reservoir is the part where a needle is inserted to give medicines or draw blood. The reservoir is round. After it is placed, it appears as a small, raised area under your skin. Catheter. The catheter is a thin, flexible tube that connects the reservoir to a vein. Medicine that is inserted into the reservoir goes into the catheter and then into the vein. How is my port accessed? To access your port: A numbing cream may be placed on the skin over the port site. Your health care provider will put on a mask and sterile gloves. The skin over your port will be cleaned carefully with a germ-killing soap and allowed to dry. Your health care provider will gently pinch the port and insert a needle into it. Your health care provider will check for a blood return to make sure the port is in the vein and is not clogged. If your port needs to remain accessed to get medicine continuously (constant infusion), your health care provider will place a clear bandage (dressing) over the needle site. The dressing and needle will need to be changed every week, or as told by your health care provider. What  is flushing? Flushing helps keep the port from getting clogged. Follow instructions from your health care provider about how and when to flush the port. Ports are usually flushed with saline solution or a medicine called heparin. The need for flushing will depend on how the port is used: If the port is only used from time to time to give medicines or draw blood, the port may need to be flushed: Before and after medicines have been given. Before and after blood has been drawn. As part of routine maintenance. Flushing may be recommended every 4-6 weeks. If a constant infusion is running, the port may not need to be flushed. Throw away any syringes in a disposal container that is meant for sharp items (sharps container). You can buy a sharps container from a pharmacy, or you can make one by using an empty hard plastic bottle with a cover. How long will my port stay implanted? The port can stay in for as long as your health care provider thinks it is needed. When it is time for the port to come out, a surgery will be done to remove it. The surgery will be similar to the procedure that was done to put the port in. Follow these instructions at home:  Flush your port as told by your health care provider. If you need an infusion over several days, follow instructions from your health care provider about how   to take care of your port site. Make sure you: Wash your hands with soap and water before you change your dressing. If soap and water are not available, use alcohol-based hand sanitizer. Change your dressing as told by your health care provider. Place any used dressings or infusion bags into a plastic bag. Throw that bag in the trash. Keep the dressing that covers the needle clean and dry. Do not get it wet. Do not use scissors or sharp objects near the tube. Keep the tube clamped, unless it is being used. Check your port site every day for signs of infection. Check for: Redness, swelling, or  pain. Fluid or blood. Pus or a bad smell. Protect the skin around the port site. Avoid wearing bra straps that rub or irritate the site. Protect the skin around your port from seat belts. Place a soft pad over your chest if needed. Bathe or shower as told by your health care provider. The site may get wet as long as you are not actively receiving an infusion. Return to your normal activities as told by your health care provider. Ask your health care provider what activities are safe for you. Carry a medical alert card or wear a medical alert bracelet at all times. This will let health care providers know that you have an implanted port in case of an emergency. Get help right away if: You have redness, swelling, or pain at the port site. You have fluid or blood coming from your port site. You have pus or a bad smell coming from the port site. You have a fever. Summary Implanted ports are usually placed in the chest for long-term IV access. Follow instructions from your health care provider about flushing the port and changing bandages (dressings). Take care of the area around your port by avoiding clothing that puts pressure on the area, and by watching for signs of infection. Protect the skin around your port from seat belts. Place a soft pad over your chest if needed. Get help right away if you have a fever or you have redness, swelling, pain, drainage, or a bad smell at the port site. This information is not intended to replace advice given to you by your health care provider. Make sure you discuss any questions you have with your health care provider. Document Revised: 11/29/2020 Document Reviewed: 01/24/2020 Elsevier Patient Education  2022 [employer]  

## 2021-06-28 NOTE — Progress Notes (Signed)
Patient in for pump stop today.  Patient complained on tremors/shakiness in bilateral hands.  Patient also reported restless leg on Tuesday evening. Patient stated the restless leg has resolved and the shakiness in his hands has improved, but still was there.  Informed patient the steroids he received the day of his treatment can cause restless leg and shakiness.  Dr. [name] notified of patient's symptoms.  No new orders at this time. Request sent for Dr. Gearldine Shown nurse to follow-up with patient.

## 2021-07-08 ENCOUNTER — Other Ambulatory Visit: Payer: Self-pay | Admitting: Oncology

## 2021-07-10 ENCOUNTER — Other Ambulatory Visit: Payer: Self-pay

## 2021-07-10 ENCOUNTER — Encounter: Payer: Self-pay | Admitting: Nurse Practitioner

## 2021-07-10 ENCOUNTER — Inpatient Hospital Stay: Payer: Medicare Other

## 2021-07-10 ENCOUNTER — Inpatient Hospital Stay (HOSPITAL_BASED_OUTPATIENT_CLINIC_OR_DEPARTMENT_OTHER): Payer: Medicare Other | Admitting: Nurse Practitioner

## 2021-07-10 VITALS — BP 145/78 | HR 61 | Temp 98.1°F | Resp 20 | Ht 69.0 in | Wt 170.0 lb

## 2021-07-10 DIAGNOSIS — C20 Malignant neoplasm of rectum: Secondary | ICD-10-CM

## 2021-07-10 DIAGNOSIS — Z5111 Encounter for antineoplastic chemotherapy: Secondary | ICD-10-CM | POA: Diagnosis not present

## 2021-07-10 LAB — CMP (CANCER CENTER ONLY)
ALT: 14 U/L (ref 0–44)
AST: 16 U/L (ref 15–41)
Albumin: 3.9 g/dL (ref 3.5–5.0)
Alkaline Phosphatase: 73 U/L (ref 38–126)
Anion gap: 9 (ref 5–15)
BUN: 13 mg/dL (ref 8–23)
CO2: 25 mmol/L (ref 22–32)
Calcium: 9.6 mg/dL (ref 8.9–10.3)
Chloride: 107 mmol/L (ref 98–111)
Creatinine: 0.92 mg/dL (ref 0.61–1.24)
GFR, Estimated: >60 mL/min (ref >60)
Glucose, Bld: 110 mg/dL — ABNORMAL HIGH (ref 70–99)
Potassium: 3.6 mmol/L (ref 3.5–5.1)
Sodium: 141 mmol/L (ref 135–145)
Total Bilirubin: 0.9 mg/dL (ref 0.3–1.2)
Total Protein: 6.2 g/dL — ABNORMAL LOW (ref 6.5–8.1)

## 2021-07-10 LAB — CBC WITH DIFFERENTIAL (CANCER CENTER ONLY)
Abs Immature Granulocytes: 0.01 10*3/uL (ref 0.00–0.07)
Basophils Absolute: 0 10*3/uL (ref 0.0–0.1)
Basophils Relative: 0 %
Eosinophils Absolute: 0.1 10*3/uL (ref 0.0–0.5)
Eosinophils Relative: 3 %
HCT: 34.1 % — ABNORMAL LOW (ref 39.0–52.0)
Hemoglobin: 11.7 g/dL — ABNORMAL LOW (ref 13.0–17.0)
Immature Granulocytes: 0 %
Lymphocytes Relative: 16 %
Lymphs Abs: 0.5 10*3/uL — ABNORMAL LOW (ref 0.7–4.0)
MCH: 27.1 pg (ref 26.0–34.0)
MCHC: 34.3 g/dL (ref 30.0–36.0)
MCV: 78.9 fL — ABNORMAL LOW (ref 80.0–100.0)
Monocytes Absolute: 0.4 10*3/uL (ref 0.1–1.0)
Monocytes Relative: 13 %
Neutro Abs: 2.1 10*3/uL (ref 1.7–7.7)
Neutrophils Relative %: 68 %
Platelet Count: 167 10*3/uL (ref 150–400)
RBC: 4.32 MIL/uL (ref 4.22–5.81)
RDW: 17.8 % — ABNORMAL HIGH (ref 11.5–15.5)
WBC Count: 3.2 10*3/uL — ABNORMAL LOW (ref 4.0–10.5)
nRBC: 0 % (ref 0.0–0.2)

## 2021-07-10 LAB — CEA (ACCESS): CEA (CHCC): 191.69 ng/mL — ABNORMAL HIGH (ref 0.00–5.00)

## 2021-07-10 MED ORDER — OXALIPLATIN CHEMO INJECTION 100 MG/20ML
65.0000 mg/m2 | Freq: Once | INTRAVENOUS | Status: AC
  Administered 2021-07-10: 130 mg via INTRAVENOUS
  Filled 2021-07-10: qty 20

## 2021-07-10 MED ORDER — PALONOSETRON HCL INJECTION 0.25 MG/5ML
0.2500 mg | Freq: Once | INTRAVENOUS | Status: AC
  Administered 2021-07-10: 0.25 mg via INTRAVENOUS
  Filled 2021-07-10: qty 5

## 2021-07-10 MED ORDER — DEXTROSE 5 % IV SOLN: Freq: Once | INTRAVENOUS | Status: AC

## 2021-07-10 MED ORDER — FLUOROURACIL CHEMO INJECTION 2.5 GM/50ML
400.0000 mg/m2 | Freq: Once | INTRAVENOUS | Status: AC
  Administered 2021-07-10: 800 mg via INTRAVENOUS
  Filled 2021-07-10: qty 16

## 2021-07-10 MED ORDER — FLUOROURACIL CHEMO INJECTION 5 GM/100ML
2000.0000 mg/m2 | INTRAVENOUS | Status: DC
  Administered 2021-07-10: 3950 mg via INTRAVENOUS
  Filled 2021-07-10: qty 79

## 2021-07-10 MED ORDER — SODIUM CHLORIDE 0.9 % IV SOLN
10.0000 mg | Freq: Once | INTRAVENOUS | Status: AC
  Administered 2021-07-10: 10 mg via INTRAVENOUS
  Filled 2021-07-10: qty 1

## 2021-07-10 MED ORDER — LEUCOVORIN CALCIUM INJECTION 350 MG
400.0000 mg/m2 | Freq: Once | INTRAVENOUS | Status: AC
  Administered 2021-07-10: 788 mg via INTRAVENOUS
  Filled 2021-07-10: qty 39.4

## 2021-07-10 NOTE — Progress Notes (Signed)
Patient presents for treatment. RN assessment completed along with the following:  Labs/vitals reviewed - Yes, and within treatment parameters.   Weight within 10% of previous measurement - Yes Oncology Treatment Attestation completed for current therapy- Yes, on date 06/05/2021 Informed consent completed and reflects current therapy/intent - Yes, on date 06/12/2021             Provider progress note reviewed - Yes, today's provider note was reviewed. Treatment/Antibody/Supportive plan reviewed - Yes, and Per Lattie Haw, NP: We will dose reduce oxaliplatin to 65 mg per metered squared beginning with today's treatment S&H and other orders reviewed - Yes, and there are no additional orders identified. Previous treatment date reviewed - Yes, and the appropriate amount of time has elapsed between treatments. Clinic Hand Off Received from - Yes, from Lattie Haw, NP  Patient to proceed with treatment.

## 2021-07-10 NOTE — Patient Instructions (Addendum)
[facility]   Discharge Instructions: Thank you for choosing [facility] to provide your oncology and hematology care.   If you have a lab appointment with the [facility], please go directly to the [facility] and check in at the registration area.   Wear comfortable clothing and clothing appropriate for easy access to any Portacath or PICC line.   We strive to give you quality time with your provider. You may need to reschedule your appointment if you arrive late (15 or more minutes).  Arriving late affects you and other patients whose appointments are after yours.  Also, if you miss three or more appointments without notifying the office, you may be dismissed from the clinic at the provider's discretion.      For prescription refill requests, have your pharmacy contact our office and allow 72 hours for refills to be completed.    Today you received the following chemotherapy and/or immunotherapy agents Oxaliplatin (ELOXATIN), Leucovorin & Flourouracil (ADRUCIL).       To help prevent nausea and vomiting after your treatment, we encourage you to take your nausea medication as directed.  BELOW ARE SYMPTOMS THAT SHOULD BE REPORTED IMMEDIATELY: *FEVER GREATER THAN 100.4 F (38 C) OR HIGHER *CHILLS OR SWEATING *NAUSEA AND VOMITING THAT IS NOT CONTROLLED WITH YOUR NAUSEA MEDICATION *UNUSUAL SHORTNESS OF BREATH *UNUSUAL BRUISING OR BLEEDING *URINARY PROBLEMS (pain or burning when urinating, or frequent urination) *BOWEL PROBLEMS (unusual diarrhea, constipation, pain near the anus) TENDERNESS IN MOUTH AND THROAT WITH OR WITHOUT PRESENCE OF ULCERS (sore throat, sores in mouth, or a toothache) UNUSUAL RASH, SWELLING OR PAIN  UNUSUAL VAGINAL DISCHARGE OR ITCHING   Items with * indicate a potential emergency and should be followed up as soon as possible or go to the Emergency Department if any problems should occur.  Please show the CHEMOTHERAPY  ALERT CARD or IMMUNOTHERAPY ALERT CARD at check-in to the Emergency Department and triage nurse.  Should you have questions after your visit or need to cancel or reschedule your appointment, please contact [facility]  Dept: [phone]  and follow the prompts.  Office hours are 8:00 a.m. to 4:30 p.m. Monday - Friday. Please note that voicemails left after 4:00 p.m. may not be returned until the following business day.  We are closed weekends and major holidays. You have access to a nurse at all times for urgent questions. Please call the main number to the clinic Dept: [phone] and follow the prompts.   For any non-urgent questions, you may also contact your provider using MyChart. We now offer e-Visits for anyone 81 and older to request care online for non-urgent symptoms. For details visit [email or web address].   Also download the MyChart app! Go to the app store, search "MyChart", open the app, select [facility], and log in with your MyChart username and password.  Due to Covid, a mask is required upon entering the hospital/clinic. If you do not have a mask, one will be given to you upon arrival. For doctor visits, patients may have 1 support person aged 38 or older with them. For treatment visits, patients cannot have anyone with them due to current Covid guidelines and our immunocompromised population.   Oxaliplatin Injection What is this medication? OXALIPLATIN (ox AL i PLA tin) is a chemotherapy drug. It targets fast dividing cells, like cancer cells, and causes these cells to die. This medicine is used to treat cancers of the colon and rectum,  and many other cancers. This medicine may be used for other purposes; ask your health care provider or pharmacist if you have questions. COMMON BRAND NAME(S): Eloxatin What should I tell my care team before I take this medication? They need to know if you have any of these conditions: heart disease history of  irregular heartbeat liver disease low blood counts, like white cells, platelets, or red blood cells lung or breathing disease, like asthma take medicines that treat or prevent blood clots tingling of the fingers or toes, or other nerve disorder an unusual or allergic reaction to oxaliplatin, other chemotherapy, other medicines, foods, dyes, or preservatives pregnant or trying to get pregnant breast-feeding How should I use this medication? This drug is given as an infusion into a vein. It is administered in a hospital or clinic by a specially trained health care professional. Talk to your pediatrician regarding the use of this medicine in children. Special care may be needed. Overdosage: If you think you have taken too much of this medicine contact a poison control center or emergency room at once. NOTE: This medicine is only for you. Do not share this medicine with others. What if I miss a dose? It is important not to miss a dose. Call your doctor or health care professional if you are unable to keep an appointment. What may interact with this medication? Do not take this medicine with any of the following medications: cisapride dronedarone pimozide thioridazine This medicine may also interact with the following medications: aspirin and aspirin-like medicines certain medicines that treat or prevent blood clots like warfarin, apixaban, dabigatran, and rivaroxaban cisplatin cyclosporine diuretics medicines for infection like acyclovir, adefovir, amphotericin B, bacitracin, cidofovir, foscarnet, ganciclovir, gentamicin, pentamidine, vancomycin NSAIDs, medicines for pain and inflammation, like ibuprofen or naproxen other medicines that prolong the QT interval (an abnormal heart rhythm) pamidronate zoledronic acid This list may not describe all possible interactions. Give your health care provider a list of all the medicines, herbs, non-prescription drugs, or dietary supplements you use.  Also tell them if you smoke, drink alcohol, or use illegal drugs. Some items may interact with your medicine. What should I watch for while using this medication? Your condition will be monitored carefully while you are receiving this medicine. You may need blood work done while you are taking this medicine. This medicine may make you feel generally unwell. This is not uncommon as chemotherapy can affect healthy cells as well as cancer cells. Report any side effects. Continue your course of treatment even though you feel ill unless your healthcare professional tells you to stop. This medicine can make you more sensitive to cold. Do not drink cold drinks or use ice. Cover exposed skin before coming in contact with cold temperatures or cold objects. When out in cold weather wear warm clothing and cover your mouth and nose to warm the air that goes into your lungs. Tell your doctor if you get sensitive to the cold. Do not become pregnant while taking this medicine or for 9 months after stopping it. Women should inform their health care professional if they wish to become pregnant or think they might be pregnant. Men should not father a child while taking this medicine and for 6 months after stopping it. There is potential for serious side effects to an unborn child. Talk to your health care professional for more information. Do not breast-feed a child while taking this medicine or for 3 months after stopping it. This medicine has caused ovarian failure  in some women. This medicine may make it more difficult to get pregnant. Talk to your health care professional if you are concerned about your fertility. This medicine has caused decreased sperm counts in some men. This may make it more difficult to father a child. Talk to your health care professional if you are concerned about your fertility. This medicine may increase your risk of getting an infection. Call your health care professional for advice if you get  a fever, chills, or sore throat, or other symptoms of a cold or flu. Do not treat yourself. Try to avoid being around people who are sick. Avoid taking medicines that contain aspirin, acetaminophen, ibuprofen, naproxen, or ketoprofen unless instructed by your health care professional. These medicines may hide a fever. Be careful brushing or flossing your teeth or using a toothpick because you may get an infection or bleed more easily. If you have any dental work done, tell your dentist you are receiving this medicine. What side effects may I notice from receiving this medication? Side effects that you should report to your doctor or health care professional as soon as possible: allergic reactions like skin rash, itching or hives, swelling of the face, lips, or tongue breathing problems cough low blood counts - this medicine may decrease the number of white blood cells, red blood cells, and platelets. You may be at increased risk for infections and bleeding nausea, vomiting pain, redness, or irritation at site where injected pain, tingling, numbness in the hands or feet signs and symptoms of bleeding such as bloody or black, tarry stools; red or dark brown urine; spitting up blood or brown material that looks like coffee grounds; red spots on the skin; unusual bruising or bleeding from the eyes, gums, or nose signs and symptoms of a dangerous change in heartbeat or heart rhythm like chest pain; dizziness; fast, irregular heartbeat; palpitations; feeling faint or lightheaded; falls signs and symptoms of infection like fever; chills; cough; sore throat; pain or trouble passing urine signs and symptoms of liver injury like dark yellow or brown urine; general ill feeling or flu-like symptoms; light-colored stools; loss of appetite; nausea; right upper belly pain; unusually weak or tired; yellowing of the eyes or skin signs and symptoms of low red blood cells or anemia such as unusually weak or tired;  feeling faint or lightheaded; falls signs and symptoms of muscle injury like dark urine; trouble passing urine or change in the amount of urine; unusually weak or tired; muscle pain; back pain Side effects that usually do not require medical attention (report to your doctor or health care professional if they continue or are bothersome): changes in taste diarrhea gas hair loss loss of appetite mouth sores This list may not describe all possible side effects. Call your doctor for medical advice about side effects. You may report side effects to FDA at 1-800-FDA-1088. Where should I keep my medication? This drug is given in a hospital or clinic and will not be stored at home. NOTE: This sheet is a summary. It may not cover all possible information. If you have questions about this medicine, talk to your doctor, pharmacist, or health care provider.  2022 Elsevier/Gold Standard (2019-01-27 12:20:35)  Leucovorin injection What is this medication? LEUCOVORIN (loo koe VOR in) is used to prevent or treat the harmful effects of some medicines. This medicine is used to treat anemia caused by a low amount of folic acid in the body. It is also used with 5-fluorouracil (5-FU) to treat  colon cancer. This medicine may be used for other purposes; ask your health care provider or pharmacist if you have questions. What should I tell my care team before I take this medication? They need to know if you have any of these conditions: anemia from low levels of vitamin B-12 in the blood an unusual or allergic reaction to leucovorin, folic acid, other medicines, foods, dyes, or preservatives pregnant or trying to get pregnant breast-feeding How should I use this medication? This medicine is for injection into a muscle or into a vein. It is given by a health care professional in a hospital or clinic setting. Talk to your pediatrician regarding the use of this medicine in children. Special care may be  needed. Overdosage: If you think you have taken too much of this medicine contact a poison control center or emergency room at once. NOTE: This medicine is only for you. Do not share this medicine with others. What if I miss a dose? This does not apply. What may interact with this medication? capecitabine fluorouracil phenobarbital phenytoin primidone trimethoprim-sulfamethoxazole This list may not describe all possible interactions. Give your health care provider a list of all the medicines, herbs, non-prescription drugs, or dietary supplements you use. Also tell them if you smoke, drink alcohol, or use illegal drugs. Some items may interact with your medicine. What should I watch for while using this medication? Your condition will be monitored carefully while you are receiving this medicine. This medicine may increase the side effects of 5-fluorouracil, 5-FU. Tell your doctor or health care professional if you have diarrhea or mouth sores that do not get better or that get worse. What side effects may I notice from receiving this medication? Side effects that you should report to your doctor or health care professional as soon as possible: allergic reactions like skin rash, itching or hives, swelling of the face, lips, or tongue breathing problems fever, infection mouth sores unusual bleeding or bruising unusually weak or tired Side effects that usually do not require medical attention (report to your doctor or health care professional if they continue or are bothersome): constipation or diarrhea loss of appetite nausea, vomiting This list may not describe all possible side effects. Call your doctor for medical advice about side effects. You may report side effects to FDA at 1-800-FDA-1088. Where should I keep my medication? This drug is given in a hospital or clinic and will not be stored at home. NOTE: This sheet is a summary. It may not cover all possible information. If you have  questions about this medicine, talk to your doctor, pharmacist, or health care provider.  2022 Elsevier/Gold Standard (2008-03-15 16:50:29)  Fluorouracil, 5-FU injection What is this medication? FLUOROURACIL, 5-FU (flure oh YOOR a sil) is a chemotherapy drug. It slows the growth of cancer cells. This medicine is used to treat many types of cancer like breast cancer, colon or rectal cancer, pancreatic cancer, and stomach cancer. This medicine may be used for other purposes; ask your health care provider or pharmacist if you have questions. COMMON BRAND NAME(S): Adrucil What should I tell my care team before I take this medication? They need to know if you have any of these conditions: blood disorders dihydropyrimidine dehydrogenase (DPD) deficiency infection (especially a virus infection such as chickenpox, cold sores, or herpes) kidney disease liver disease malnourished, poor nutrition recent or ongoing radiation therapy an unusual or allergic reaction to fluorouracil, other chemotherapy, other medicines, foods, dyes, or preservatives pregnant or trying to get  pregnant breast-feeding How should I use this medication? This drug is given as an infusion or injection into a vein. It is administered in a hospital or clinic by a specially trained health care professional. Talk to your pediatrician regarding the use of this medicine in children. Special care may be needed. Overdosage: If you think you have taken too much of this medicine contact a poison control center or emergency room at once. NOTE: This medicine is only for you. Do not share this medicine with others. What if I miss a dose? It is important not to miss your dose. Call your doctor or health care professional if you are unable to keep an appointment. What may interact with this medication? Do not take this medicine with any of the following medications: live virus vaccines This medicine may also interact with the following  medications: medicines that treat or prevent blood clots like warfarin, enoxaparin, and dalteparin This list may not describe all possible interactions. Give your health care provider a list of all the medicines, herbs, non-prescription drugs, or dietary supplements you use. Also tell them if you smoke, drink alcohol, or use illegal drugs. Some items may interact with your medicine. What should I watch for while using this medication? Visit your doctor for checks on your progress. This drug may make you feel generally unwell. This is not uncommon, as chemotherapy can affect healthy cells as well as cancer cells. Report any side effects. Continue your course of treatment even though you feel ill unless your doctor tells you to stop. In some cases, you may be given additional medicines to help with side effects. Follow all directions for their use. Call your doctor or health care professional for advice if you get a fever, chills or sore throat, or other symptoms of a cold or flu. Do not treat yourself. This drug decreases your body's ability to fight infections. Try to avoid being around people who are sick. This medicine may increase your risk to bruise or bleed. Call your doctor or health care professional if you notice any unusual bleeding. Be careful brushing and flossing your teeth or using a toothpick because you may get an infection or bleed more easily. If you have any dental work done, tell your dentist you are receiving this medicine. Avoid taking products that contain aspirin, acetaminophen, ibuprofen, naproxen, or ketoprofen unless instructed by your doctor. These medicines may hide a fever. Do not become pregnant while taking this medicine. Women should inform their doctor if they wish to become pregnant or think they might be pregnant. There is a potential for serious side effects to an unborn child. Talk to your health care professional or pharmacist for more information. Do not breast-feed  an infant while taking this medicine. Men should inform their doctor if they wish to father a child. This medicine may lower sperm counts. Do not treat diarrhea with over the counter products. Contact your doctor if you have diarrhea that lasts more than 2 days or if it is severe and watery. This medicine can make you more sensitive to the sun. Keep out of the sun. If you cannot avoid being in the sun, wear protective clothing and use sunscreen. Do not use sun lamps or tanning beds/booths. What side effects may I notice from receiving this medication? Side effects that you should report to your doctor or health care professional as soon as possible: allergic reactions like skin rash, itching or hives, swelling of the face, lips, or tongue low  blood counts - this medicine may decrease the number of white blood cells, red blood cells and platelets. You may be at increased risk for infections and bleeding. signs of infection - fever or chills, cough, sore throat, pain or difficulty passing urine signs of decreased platelets or bleeding - bruising, pinpoint red spots on the skin, black, tarry stools, blood in the urine signs of decreased red blood cells - unusually weak or tired, fainting spells, lightheadedness breathing problems changes in vision chest pain mouth sores nausea and vomiting pain, swelling, redness at site where injected pain, tingling, numbness in the hands or feet redness, swelling, or sores on hands or feet stomach pain unusual bleeding Side effects that usually do not require medical attention (report to your doctor or health care professional if they continue or are bothersome): changes in finger or toe nails diarrhea dry or itchy skin hair loss headache loss of appetite sensitivity of eyes to the light stomach upset unusually teary eyes This list may not describe all possible side effects. Call your doctor for medical advice about side effects. You may report side  effects to FDA at 1-800-FDA-1088. Where should I keep my medication? This drug is given in a hospital or clinic and will not be stored at home. NOTE: This sheet is a summary. It may not cover all possible information. If you have questions about this medicine, talk to your doctor, pharmacist, or health care provider.  2022 Elsevier/Gold Standard (2019-08-10 15:00:03)  The chemotherapy medication bag should finish at 46 hours, 96 hours, or 7 days. For example, if your pump is scheduled for 46 hours and it was put on at 4:00 p.m., it should finish at 2:00 p.m. the day it is scheduled to come off regardless of your appointment time.     Estimated time to finish at 12:30 p.m on Thursday 07/12/2021.   If the display on your pump reads "Low Volume" and it is beeping, take the batteries out of the pump and come to the cancer center for it to be taken off.   If the pump alarms go off prior to the pump reading "Low Volume" then call [phone] and someone can assist you.  If the plunger comes out and the chemotherapy medication is leaking out, please use your home chemo spill kit to clean up the spill. Do NOT use paper towels or other household products.  If you have problems or questions regarding your pump, please call either [phone] (24 hours a day) or the cancer center Monday-Friday 8:00 a.m.- 4:30 p.m. at the clinic number and we will assist you. If you are unable to get assistance, then go to the nearest Emergency Department and ask the staff to contact the IV team for assistance.

## 2021-07-10 NOTE — Progress Notes (Signed)
  [facility] Perry OFFICE PROGRESS NOTE   Diagnosis: Rectal cancer  INTERVAL HISTORY:   Neil Perry returns as scheduled.  He completed cycle 2 FOLFOX 06/26/2021.  He reports tolerating chemotherapy well.  No nausea/vomiting.  No mouth sores.  No diarrhea.  He has frequent formed bowel movements.  Appetite is better.  He has mild persistent cold sensitivity.  No numbness or tingling in the absence of cold exposure.  Objective:  Vital signs in last 24 hours:  Blood pressure (!) 145/78, pulse 61, temperature 98.1 F (36.7 C), temperature source Oral, resp. rate 20, height $RemoveBe'5\' 9"'IpDPWQIaQ$  (1.753 m), weight 170 lb (77.1 kg), SpO2 100 %.    HEENT: No thrush or ulcers. Resp: Lungs clear bilaterally. Cardio: Regular rate and rhythm. GI: Abdomen soft and nontender.  No hepatomegaly. Vascular: No leg edema. Neuro: Vibratory sense intact over the fingertips per tuning fork exam. Skin: Palms without erythema. Port-A-Cath without erythema.   Lab Results:  Lab Results  Component Value Date   WBC 4.4 06/26/2021   HGB 11.7 (L) 06/26/2021   HCT 34.6 (L) 06/26/2021   MCV 79.5 (L) 06/26/2021   PLT 211 06/26/2021   NEUTROABS 3.4 06/26/2021    Imaging:  No results found.  Medications: I have reviewed the patient's current medications.  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; foundation 1-microsatellite stable, tumor mutation burden 5, K-rasG 12V, NRAS wildtype Colonoscopy 05/22/2021-ulcerated partially obstructing mass at 15 cm from anal verge CTs 05/30/2021-numerous small pulmonary nodules concerning for metastases, thickening of the rectum, multiple liver  metastases Cycle 1 FOLFOX 06/12/2021 Cycle 2 FOLFOX 06/26/2021 Cycle 3 FOLFOX 07/10/2021, oxaliplatin dose reduced due to progressive decline in the [place] and platelet count Prostate cancer-simple prostatectomy January 2019, Gleason 3+3, 10 to 12% of specimen Active surveillance, biopsy May 2019 with small focus of Gleason 3+3 disease in 1/6 cores, surveillance continued Elevated PSA 04/20/2020 Biopsy 06/19/2020 8/12 cores positive, Gleason 4+4 PET scan 07/07/2020-negative CT 07/07/2020 right iliac and retrocaval adenopathy, 1.3 cm subcapsular right lower pole renal lesion Androgen deprivation therapy beginning 08/21/2020 Radiation to prostate, seminal vesicles, and pelvic lymph nodes to 10/22 - 12/28/2020 He continues every 25-month Firmagon and daily Xtandi   3.  Right renal mass consistent with a renal cell carcinoma-stable on MRI abdomen 8/3/20224.  4.  Mitral valve prolapse 5.  Family history of prostate cancer    Disposition: Neil Perry appears stable.  He has completed 2 cycles of FOLFOX.  He tolerated cycle 2 well.  Plan to proceed with cycle 3 today as scheduled.  Review of the CBC completed.  Counts adequate to proceed with treatment.  Slow decline in the [place] and platelet count.  We will dose reduce oxaliplatin to 65 mg per metered squared beginning with today's treatment.  He agrees with this plan.  He will return for lab, follow-up, FOLFOX in 2 weeks.  We are available to see him sooner if needed.  Plan reviewed with Dr. [name].    Ned Card ANP/GNP-BC   07/10/2021  9:43 AM

## 2021-07-11 LAB — PSA: PSA: 0.24

## 2021-07-12 ENCOUNTER — Inpatient Hospital Stay: Payer: Medicare Other

## 2021-07-12 ENCOUNTER — Other Ambulatory Visit: Payer: Self-pay

## 2021-07-12 VITALS — BP 136/79 | HR 78 | Temp 98.3°F | Resp 18

## 2021-07-12 DIAGNOSIS — Z5111 Encounter for antineoplastic chemotherapy: Secondary | ICD-10-CM | POA: Diagnosis not present

## 2021-07-12 DIAGNOSIS — C20 Malignant neoplasm of rectum: Secondary | ICD-10-CM

## 2021-07-12 MED ORDER — HEPARIN SOD (PORK) LOCK FLUSH 100 UNIT/ML IV SOLN
500.0000 [IU] | Freq: Once | INTRAVENOUS | Status: AC | PRN
  Administered 2021-07-12: 500 [IU]

## 2021-07-12 MED ORDER — SODIUM CHLORIDE 0.9% FLUSH
10.0000 mL | INTRAVENOUS | Status: DC | PRN
  Administered 2021-07-12: 10 mL

## 2021-07-18 ENCOUNTER — Other Ambulatory Visit: Payer: Self-pay | Admitting: Family Medicine

## 2021-07-22 ENCOUNTER — Other Ambulatory Visit: Payer: Self-pay | Admitting: Oncology

## 2021-07-24 ENCOUNTER — Other Ambulatory Visit: Payer: Self-pay

## 2021-07-24 ENCOUNTER — Telehealth: Payer: Self-pay

## 2021-07-24 ENCOUNTER — Inpatient Hospital Stay: Payer: Medicare Other

## 2021-07-24 ENCOUNTER — Inpatient Hospital Stay: Payer: Medicare Other | Attending: Nurse Practitioner

## 2021-07-24 ENCOUNTER — Inpatient Hospital Stay (HOSPITAL_BASED_OUTPATIENT_CLINIC_OR_DEPARTMENT_OTHER): Payer: Medicare Other | Admitting: Oncology

## 2021-07-24 VITALS — BP 147/77 | HR 80 | Temp 97.8°F | Resp 18 | Ht 69.0 in | Wt 165.6 lb

## 2021-07-24 DIAGNOSIS — C61 Malignant neoplasm of prostate: Secondary | ICD-10-CM | POA: Diagnosis not present

## 2021-07-24 DIAGNOSIS — Z23 Encounter for immunization: Secondary | ICD-10-CM | POA: Diagnosis not present

## 2021-07-24 DIAGNOSIS — Z5111 Encounter for antineoplastic chemotherapy: Secondary | ICD-10-CM | POA: Insufficient documentation

## 2021-07-24 DIAGNOSIS — G629 Polyneuropathy, unspecified: Secondary | ICD-10-CM | POA: Insufficient documentation

## 2021-07-24 DIAGNOSIS — Z8546 Personal history of malignant neoplasm of prostate: Secondary | ICD-10-CM | POA: Insufficient documentation

## 2021-07-24 DIAGNOSIS — C20 Malignant neoplasm of rectum: Secondary | ICD-10-CM | POA: Diagnosis not present

## 2021-07-24 DIAGNOSIS — N281 Cyst of kidney, acquired: Secondary | ICD-10-CM | POA: Insufficient documentation

## 2021-07-24 LAB — CMP (CANCER CENTER ONLY)
ALT: 21 U/L (ref 0–44)
AST: 20 U/L (ref 15–41)
Albumin: 3.7 g/dL (ref 3.5–5.0)
Alkaline Phosphatase: 58 U/L (ref 38–126)
Anion gap: 8 (ref 5–15)
BUN: 15 mg/dL (ref 8–23)
CO2: 26 mmol/L (ref 22–32)
Calcium: 9.4 mg/dL (ref 8.9–10.3)
Chloride: 107 mmol/L (ref 98–111)
Creatinine: 1.17 mg/dL (ref 0.61–1.24)
GFR, Estimated: >60 mL/min (ref >60)
Glucose, Bld: 106 mg/dL — ABNORMAL HIGH (ref 70–99)
Potassium: 3.9 mmol/L (ref 3.5–5.1)
Sodium: 141 mmol/L (ref 135–145)
Total Bilirubin: 1 mg/dL (ref 0.3–1.2)
Total Protein: 6.1 g/dL — ABNORMAL LOW (ref 6.5–8.1)

## 2021-07-24 LAB — CBC WITH DIFFERENTIAL (CANCER CENTER ONLY)
Abs Immature Granulocytes: 0 10*3/uL (ref 0.00–0.07)
Basophils Absolute: 0 10*3/uL (ref 0.0–0.1)
Basophils Relative: 0 %
Eosinophils Absolute: 0.1 10*3/uL (ref 0.0–0.5)
Eosinophils Relative: 3 %
HCT: 31.9 % — ABNORMAL LOW (ref 39.0–52.0)
Hemoglobin: 11.1 g/dL — ABNORMAL LOW (ref 13.0–17.0)
Immature Granulocytes: 0 %
Lymphocytes Relative: 20 %
Lymphs Abs: 0.5 10*3/uL — ABNORMAL LOW (ref 0.7–4.0)
MCH: 27.9 pg (ref 26.0–34.0)
MCHC: 34.8 g/dL (ref 30.0–36.0)
MCV: 80.2 fL (ref 80.0–100.0)
Monocytes Absolute: 0.3 10*3/uL (ref 0.1–1.0)
Monocytes Relative: 11 %
Neutro Abs: 1.7 10*3/uL (ref 1.7–7.7)
Neutrophils Relative %: 66 %
Platelet Count: 108 10*3/uL — ABNORMAL LOW (ref 150–400)
RBC: 3.98 MIL/uL — ABNORMAL LOW (ref 4.22–5.81)
RDW: 19.5 % — ABNORMAL HIGH (ref 11.5–15.5)
WBC Count: 2.6 10*3/uL — ABNORMAL LOW (ref 4.0–10.5)
nRBC: 0 % (ref 0.0–0.2)

## 2021-07-24 MED ORDER — DEXTROSE 5 % IV SOLN: Freq: Once | INTRAVENOUS | Status: AC

## 2021-07-24 MED ORDER — FLUOROURACIL CHEMO INJECTION 5 GM/100ML
2000.0000 mg/m2 | INTRAVENOUS | Status: DC
  Administered 2021-07-24: 3800 mg via INTRAVENOUS
  Filled 2021-07-24: qty 76

## 2021-07-24 MED ORDER — SODIUM CHLORIDE 0.9 % IV SOLN
10.0000 mg | Freq: Once | INTRAVENOUS | Status: AC
  Administered 2021-07-24: 10 mg via INTRAVENOUS
  Filled 2021-07-24: qty 1

## 2021-07-24 MED ORDER — PALONOSETRON HCL INJECTION 0.25 MG/5ML
0.2500 mg | Freq: Once | INTRAVENOUS | Status: AC
  Administered 2021-07-24: 0.25 mg via INTRAVENOUS
  Filled 2021-07-24: qty 5

## 2021-07-24 MED ORDER — FLUOROURACIL CHEMO INJECTION 2.5 GM/50ML
400.0000 mg/m2 | Freq: Once | INTRAVENOUS | Status: AC
  Administered 2021-07-24: 750 mg via INTRAVENOUS
  Filled 2021-07-24: qty 15

## 2021-07-24 MED ORDER — LEUCOVORIN CALCIUM INJECTION 350 MG
400.0000 mg/m2 | Freq: Once | INTRAVENOUS | Status: AC
  Administered 2021-07-24: 764 mg via INTRAVENOUS
  Filled 2021-07-24: qty 38.2

## 2021-07-24 MED ORDER — OXALIPLATIN CHEMO INJECTION 100 MG/20ML
65.0000 mg/m2 | Freq: Once | INTRAVENOUS | Status: AC
  Administered 2021-07-24: 125 mg via INTRAVENOUS
  Filled 2021-07-24: qty 10

## 2021-07-24 MED ORDER — INFLUENZA VAC A&B SA ADJ QUAD 0.5 ML IM PRSY
0.5000 mL | PREFILLED_SYRINGE | Freq: Once | INTRAMUSCULAR | Status: AC
  Administered 2021-07-24: 0.5 mL via INTRAMUSCULAR
  Filled 2021-07-24: qty 0.5

## 2021-07-24 NOTE — Progress Notes (Signed)
Patient presents for treatment. RN assessment completed along with the following:  Labs/vitals reviewed - Yes, and within treatment parameters.   Weight within 10% of previous measurement - Yes Oncology Treatment Attestation completed for current therapy- Yes, on date 06/05/2021 Informed consent completed and reflects current therapy/intent - Yes, on date 06/12/2021             Provider progress note reviewed - Yes, today's provider note was reviewed. Treatment/Antibody/Supportive plan reviewed - Yes, and there are no adjustments needed for today's treatment. S&H and other orders reviewed - Yes, and there are no additional orders identified. Previous treatment date reviewed - Yes, and the appropriate amount of time has elapsed between treatments. Clinic Hand Off Received from - [facility] Nurse Note.  Patient to proceed with treatment.

## 2021-07-24 NOTE — Progress Notes (Signed)
[place] OFFICE PROGRESS NOTE   Diagnosis: Rectal cancer  INTERVAL HISTORY:   Mr. Lantry completed another cycle of FOLFOX on 07/10/2021.  No nausea/vomiting, mouth sores, or diarrhea.  He has persistent cold sensitivity and altered taste.  He relates the altered taste to weight loss.  He has a good appetite.  He has developed a "rash "at the penis.  He was prescribed Lotrimin cream by Dr. Junious Silk.  He reports a few episodes of "dizziness "within the past few days.  He became dizzy when standing up.  No syncope.  Objective:  Vital signs in last 24 hours:  Blood pressure (!) 147/77, pulse 80, temperature 97.8 F (36.6 C), temperature source Oral, resp. rate 18, height 5' 9"  (1.753 m), weight 165 lb 9.6 oz (75.1 kg), SpO2 100 %.    HEENT: No thrush or ulcers Resp: Lungs clear bilaterally Cardio: Regular rate and rhythm GI: No hepatosplenomegaly, nontender Vascular: No leg edema GU: There is hyperpigmentation at the penis head and scrotum.  Mild erythema with superficial ulceration at the distal penile shaft    Portacath/PICC-without erythema  Lab Results:  Lab Results  Component Value Date   WBC 2.6 (L) 07/24/2021   HGB 11.1 (L) 07/24/2021   HCT 31.9 (L) 07/24/2021   MCV 80.2 07/24/2021   PLT 108 (L) 07/24/2021   NEUTROABS 1.7 07/24/2021    CMP  Lab Results  Component Value Date   NA 141 07/24/2021   K 3.9 07/24/2021   CL 107 07/24/2021   CO2 26 07/24/2021   GLUCOSE 106 (H) 07/24/2021   BUN 15 07/24/2021   CREATININE 1.17 07/24/2021   CALCIUM 9.4 07/24/2021   PROT 6.1 (L) 07/24/2021   ALBUMIN 3.7 07/24/2021   AST 20 07/24/2021   ALT 21 07/24/2021   ALKPHOS 58 07/24/2021   BILITOT 1.0 07/24/2021   GFRNONAA >60 07/24/2021    Lab Results  Component Value Date   CEA 191.69 (H) 07/10/2021    Medications: I have reviewed the patient's current medications.   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 2-3 new hypervascular  masses in the liver dome, no abdominal lymphadenopathy, stable benign left adrenal adenoma, stable right lower pole kidney mass Ultrasound-guided biopsy of a liver lesion 05/07/2021-metastatic adenocarcinoma with extensive necrosis, immunohistochemical profile consistent with a colorectal primary; foundation 1-microsatellite stable, tumor mutation burden 5, K-rasG 12V, NRAS wildtype Colonoscopy 05/22/2021-ulcerated partially obstructing mass at 15 cm from anal verge CTs 05/30/2021-numerous small pulmonary nodules concerning for metastases, thickening of the rectum, multiple liver metastases Cycle 1 FOLFOX 06/12/2021 Cycle 2 FOLFOX 06/26/2021 Cycle 3 FOLFOX 07/10/2021, oxaliplatin dose reduced due to progressive decline in the [place] and platelet count Cycle 4 FOLFOX 07/24/2021 Prostate cancer-simple prostatectomy January 2019, Gleason 3+3, 10 to 12% of specimen Active surveillance, biopsy May 2019 with small focus of Gleason 3+3 disease in 1/6 cores, surveillance continued Elevated PSA 04/20/2020 Biopsy 06/19/2020 8/12 cores positive, Gleason 4+4 PET scan 07/07/2020-negative CT 07/07/2020 right iliac and retrocaval adenopathy, 1.3 cm subcapsular right lower pole renal lesion Androgen deprivation therapy beginning 08/21/2020 Radiation to prostate, seminal vesicles, and pelvic lymph nodes to 10/22 - 12/28/2020 He continues every 42-monthFirmagon and daily Xtandi   3.  Right renal mass consistent with a renal cell carcinoma-stable on MRI abdomen 8/3/20224.  4.  Mitral valve prolapse 5.  Family history of prostate cancer      Disposition: Mr. RRennelshas completed 3 cycles of FOLFOX.  He has tolerated the chemotherapy well.  He  will complete cycle 4 today.  He will receive an influenza vaccine today.  He requested a referral for physical therapy.  The hyperpigmentation at the penis is likely related to 5-fluorouracil.  The erythema/superficial desquamation may also be related to chemotherapy.  I  encouraged him to push fluids and use nutrition supplements.  He will get up slowly from a sitting position.  Mr. Arneson will return for an office visit and chemotherapy in 2 weeks.  He will be referred for a restaging CT evaluation after cycle 5 chemotherapy.  The CEA was lower when he was here on 07/10/2021.  Betsy Coder, MD  07/24/2021  10:21 AM

## 2021-07-24 NOTE — Patient Instructions (Signed)
[place]   Discharge Instructions: Thank you for choosing [facility] to provide your oncology and hematology care.   If you have a lab appointment with the [facility], please go directly to the [facility] and check in at the registration area.   Wear comfortable clothing and clothing appropriate for easy access to any Portacath or PICC line.   We strive to give you quality time with your provider. You may need to reschedule your appointment if you arrive late (15 or more minutes).  Arriving late affects you and other patients whose appointments are after yours.  Also, if you miss three or more appointments without notifying the office, you may be dismissed from the clinic at the provider's discretion.      For prescription refill requests, have your pharmacy contact our office and allow 72 hours for refills to be completed.    Today you received the following chemotherapy and/or immunotherapy agents Oxaliplatin (ELOXATIN), Leucovorin & Flourouracil (ADRUCIL).      To help prevent nausea and vomiting after your treatment, we encourage you to take your nausea medication as directed.  BELOW ARE SYMPTOMS THAT SHOULD BE REPORTED IMMEDIATELY: *FEVER GREATER THAN 100.4 F (38 C) OR HIGHER *CHILLS OR SWEATING *NAUSEA AND VOMITING THAT IS NOT CONTROLLED WITH YOUR NAUSEA MEDICATION *UNUSUAL SHORTNESS OF BREATH *UNUSUAL BRUISING OR BLEEDING *URINARY PROBLEMS (pain or burning when urinating, or frequent urination) *BOWEL PROBLEMS (unusual diarrhea, constipation, pain near the anus) TENDERNESS IN MOUTH AND THROAT WITH OR WITHOUT PRESENCE OF ULCERS (sore throat, sores in mouth, or a toothache) UNUSUAL RASH, SWELLING OR PAIN  UNUSUAL VAGINAL DISCHARGE OR ITCHING   Items with * indicate a potential emergency and should be followed up as soon as possible or go to the Emergency Department if any problems should occur.  Please show the CHEMOTHERAPY ALERT  CARD or IMMUNOTHERAPY ALERT CARD at check-in to the Emergency Department and triage nurse.  Should you have questions after your visit or need to cancel or reschedule your appointment, please contact [facility]  Dept: [phone]  and follow the prompts.  Office hours are 8:00 a.m. to 4:30 p.m. Monday - Friday. Please note that voicemails left after 4:00 p.m. may not be returned until the following business day.  We are closed weekends and major holidays. You have access to a nurse at all times for urgent questions. Please call the main number to the clinic Dept: [phone] and follow the prompts.   For any non-urgent questions, you may also contact your provider using MyChart. We now offer e-Visits for anyone 34 and older to request care online for non-urgent symptoms. For details visit [email or web address].   Also download the MyChart app! Go to the app store, search "MyChart", open the app, select [facility], and log in with your MyChart username and password.  Due to Covid, a mask is required upon entering the hospital/clinic. If you do not have a mask, one will be given to you upon arrival. For doctor visits, patients may have 1 support person aged 67 or older with them. For treatment visits, patients cannot have anyone with them due to current Covid guidelines and our immunocompromised population.   Oxaliplatin Injection What is this medication? OXALIPLATIN (ox AL i PLA tin) is a chemotherapy drug. It targets fast dividing cells, like cancer cells, and causes these cells to die. This medicine is used to treat cancers of the colon and rectum, and  many other cancers. This medicine may be used for other purposes; ask your health care provider or pharmacist if you have questions. COMMON BRAND NAME(S): Eloxatin What should I tell my care team before I take this medication? They need to know if you have any of these conditions: heart disease history of irregular  heartbeat liver disease low blood counts, like white cells, platelets, or red blood cells lung or breathing disease, like asthma take medicines that treat or prevent blood clots tingling of the fingers or toes, or other nerve disorder an unusual or allergic reaction to oxaliplatin, other chemotherapy, other medicines, foods, dyes, or preservatives pregnant or trying to get pregnant breast-feeding How should I use this medication? This drug is given as an infusion into a vein. It is administered in a hospital or clinic by a specially trained health care professional. Talk to your pediatrician regarding the use of this medicine in children. Special care may be needed. Overdosage: If you think you have taken too much of this medicine contact a poison control center or emergency room at once. NOTE: This medicine is only for you. Do not share this medicine with others. What if I miss a dose? It is important not to miss a dose. Call your doctor or health care professional if you are unable to keep an appointment. What may interact with this medication? Do not take this medicine with any of the following medications: cisapride dronedarone pimozide thioridazine This medicine may also interact with the following medications: aspirin and aspirin-like medicines certain medicines that treat or prevent blood clots like warfarin, apixaban, dabigatran, and rivaroxaban cisplatin cyclosporine diuretics medicines for infection like acyclovir, adefovir, amphotericin B, bacitracin, cidofovir, foscarnet, ganciclovir, gentamicin, pentamidine, vancomycin NSAIDs, medicines for pain and inflammation, like ibuprofen or naproxen other medicines that prolong the QT interval (an abnormal heart rhythm) pamidronate zoledronic acid This list may not describe all possible interactions. Give your health care provider a list of all the medicines, herbs, non-prescription drugs, or dietary supplements you use. Also tell  them if you smoke, drink alcohol, or use illegal drugs. Some items may interact with your medicine. What should I watch for while using this medication? Your condition will be monitored carefully while you are receiving this medicine. You may need blood work done while you are taking this medicine. This medicine may make you feel generally unwell. This is not uncommon as chemotherapy can affect healthy cells as well as cancer cells. Report any side effects. Continue your course of treatment even though you feel ill unless your healthcare professional tells you to stop. This medicine can make you more sensitive to cold. Do not drink cold drinks or use ice. Cover exposed skin before coming in contact with cold temperatures or cold objects. When out in cold weather wear warm clothing and cover your mouth and nose to warm the air that goes into your lungs. Tell your doctor if you get sensitive to the cold. Do not become pregnant while taking this medicine or for 9 months after stopping it. Women should inform their health care professional if they wish to become pregnant or think they might be pregnant. Men should not father a child while taking this medicine and for 6 months after stopping it. There is potential for serious side effects to an unborn child. Talk to your health care professional for more information. Do not breast-feed a child while taking this medicine or for 3 months after stopping it. This medicine has caused ovarian failure in  some women. This medicine may make it more difficult to get pregnant. Talk to your health care professional if you are concerned about your fertility. This medicine has caused decreased sperm counts in some men. This may make it more difficult to father a child. Talk to your health care professional if you are concerned about your fertility. This medicine may increase your risk of getting an infection. Call your health care professional for advice if you get a fever,  chills, or sore throat, or other symptoms of a cold or flu. Do not treat yourself. Try to avoid being around people who are sick. Avoid taking medicines that contain aspirin, acetaminophen, ibuprofen, naproxen, or ketoprofen unless instructed by your health care professional. These medicines may hide a fever. Be careful brushing or flossing your teeth or using a toothpick because you may get an infection or bleed more easily. If you have any dental work done, tell your dentist you are receiving this medicine. What side effects may I notice from receiving this medication? Side effects that you should report to your doctor or health care professional as soon as possible: allergic reactions like skin rash, itching or hives, swelling of the face, lips, or tongue breathing problems cough low blood counts - this medicine may decrease the number of white blood cells, red blood cells, and platelets. You may be at increased risk for infections and bleeding nausea, vomiting pain, redness, or irritation at site where injected pain, tingling, numbness in the hands or feet signs and symptoms of bleeding such as bloody or black, tarry stools; red or dark brown urine; spitting up blood or brown material that looks like coffee grounds; red spots on the skin; unusual bruising or bleeding from the eyes, gums, or nose signs and symptoms of a dangerous change in heartbeat or heart rhythm like chest pain; dizziness; fast, irregular heartbeat; palpitations; feeling faint or lightheaded; falls signs and symptoms of infection like fever; chills; cough; sore throat; pain or trouble passing urine signs and symptoms of liver injury like dark yellow or brown urine; general ill feeling or flu-like symptoms; light-colored stools; loss of appetite; nausea; right upper belly pain; unusually weak or tired; yellowing of the eyes or skin signs and symptoms of low red blood cells or anemia such as unusually weak or tired; feeling faint  or lightheaded; falls signs and symptoms of muscle injury like dark urine; trouble passing urine or change in the amount of urine; unusually weak or tired; muscle pain; back pain Side effects that usually do not require medical attention (report to your doctor or health care professional if they continue or are bothersome): changes in taste diarrhea gas hair loss loss of appetite mouth sores This list may not describe all possible side effects. Call your doctor for medical advice about side effects. You may report side effects to FDA at 1-800-FDA-1088. Where should I keep my medication? This drug is given in a hospital or clinic and will not be stored at home. NOTE: This sheet is a summary. It may not cover all possible information. If you have questions about this medicine, talk to your doctor, pharmacist, or health care provider.  2022 Elsevier/Gold Standard (2019-01-27 12:20:35)  Leucovorin injection What is this medication? LEUCOVORIN (loo koe VOR in) is used to prevent or treat the harmful effects of some medicines. This medicine is used to treat anemia caused by a low amount of folic acid in the body. It is also used with 5-fluorouracil (5-FU) to treat colon  cancer. This medicine may be used for other purposes; ask your health care provider or pharmacist if you have questions. What should I tell my care team before I take this medication? They need to know if you have any of these conditions: anemia from low levels of vitamin B-12 in the blood an unusual or allergic reaction to leucovorin, folic acid, other medicines, foods, dyes, or preservatives pregnant or trying to get pregnant breast-feeding How should I use this medication? This medicine is for injection into a muscle or into a vein. It is given by a health care professional in a hospital or clinic setting. Talk to your pediatrician regarding the use of this medicine in children. Special care may be needed. Overdosage: If you  think you have taken too much of this medicine contact a poison control center or emergency room at once. NOTE: This medicine is only for you. Do not share this medicine with others. What if I miss a dose? This does not apply. What may interact with this medication? capecitabine fluorouracil phenobarbital phenytoin primidone trimethoprim-sulfamethoxazole This list may not describe all possible interactions. Give your health care provider a list of all the medicines, herbs, non-prescription drugs, or dietary supplements you use. Also tell them if you smoke, drink alcohol, or use illegal drugs. Some items may interact with your medicine. What should I watch for while using this medication? Your condition will be monitored carefully while you are receiving this medicine. This medicine may increase the side effects of 5-fluorouracil, 5-FU. Tell your doctor or health care professional if you have diarrhea or mouth sores that do not get better or that get worse. What side effects may I notice from receiving this medication? Side effects that you should report to your doctor or health care professional as soon as possible: allergic reactions like skin rash, itching or hives, swelling of the face, lips, or tongue breathing problems fever, infection mouth sores unusual bleeding or bruising unusually weak or tired Side effects that usually do not require medical attention (report to your doctor or health care professional if they continue or are bothersome): constipation or diarrhea loss of appetite nausea, vomiting This list may not describe all possible side effects. Call your doctor for medical advice about side effects. You may report side effects to FDA at 1-800-FDA-1088. Where should I keep my medication? This drug is given in a hospital or clinic and will not be stored at home. NOTE: This sheet is a summary. It may not cover all possible information. If you have questions about this  medicine, talk to your doctor, pharmacist, or health care provider.  2022 Elsevier/Gold Standard (2008-03-15 16:50:29)  Fluorouracil, 5-FU injection What is this medication? FLUOROURACIL, 5-FU (flure oh YOOR a sil) is a chemotherapy drug. It slows the growth of cancer cells. This medicine is used to treat many types of cancer like breast cancer, colon or rectal cancer, pancreatic cancer, and stomach cancer. This medicine may be used for other purposes; ask your health care provider or pharmacist if you have questions. COMMON BRAND NAME(S): Adrucil What should I tell my care team before I take this medication? They need to know if you have any of these conditions: blood disorders dihydropyrimidine dehydrogenase (DPD) deficiency infection (especially a virus infection such as chickenpox, cold sores, or herpes) kidney disease liver disease malnourished, poor nutrition recent or ongoing radiation therapy an unusual or allergic reaction to fluorouracil, other chemotherapy, other medicines, foods, dyes, or preservatives pregnant or trying to get pregnant  breast-feeding How should I use this medication? This drug is given as an infusion or injection into a vein. It is administered in a hospital or clinic by a specially trained health care professional. Talk to your pediatrician regarding the use of this medicine in children. Special care may be needed. Overdosage: If you think you have taken too much of this medicine contact a poison control center or emergency room at once. NOTE: This medicine is only for you. Do not share this medicine with others. What if I miss a dose? It is important not to miss your dose. Call your doctor or health care professional if you are unable to keep an appointment. What may interact with this medication? Do not take this medicine with any of the following medications: live virus vaccines This medicine may also interact with the following medications: medicines  that treat or prevent blood clots like warfarin, enoxaparin, and dalteparin This list may not describe all possible interactions. Give your health care provider a list of all the medicines, herbs, non-prescription drugs, or dietary supplements you use. Also tell them if you smoke, drink alcohol, or use illegal drugs. Some items may interact with your medicine. What should I watch for while using this medication? Visit your doctor for checks on your progress. This drug may make you feel generally unwell. This is not uncommon, as chemotherapy can affect healthy cells as well as cancer cells. Report any side effects. Continue your course of treatment even though you feel ill unless your doctor tells you to stop. In some cases, you may be given additional medicines to help with side effects. Follow all directions for their use. Call your doctor or health care professional for advice if you get a fever, chills or sore throat, or other symptoms of a cold or flu. Do not treat yourself. This drug decreases your body's ability to fight infections. Try to avoid being around people who are sick. This medicine may increase your risk to bruise or bleed. Call your doctor or health care professional if you notice any unusual bleeding. Be careful brushing and flossing your teeth or using a toothpick because you may get an infection or bleed more easily. If you have any dental work done, tell your dentist you are receiving this medicine. Avoid taking products that contain aspirin, acetaminophen, ibuprofen, naproxen, or ketoprofen unless instructed by your doctor. These medicines may hide a fever. Do not become pregnant while taking this medicine. Women should inform their doctor if they wish to become pregnant or think they might be pregnant. There is a potential for serious side effects to an unborn child. Talk to your health care professional or pharmacist for more information. Do not breast-feed an infant while taking  this medicine. Men should inform their doctor if they wish to father a child. This medicine may lower sperm counts. Do not treat diarrhea with over the counter products. Contact your doctor if you have diarrhea that lasts more than 2 days or if it is severe and watery. This medicine can make you more sensitive to the sun. Keep out of the sun. If you cannot avoid being in the sun, wear protective clothing and use sunscreen. Do not use sun lamps or tanning beds/booths. What side effects may I notice from receiving this medication? Side effects that you should report to your doctor or health care professional as soon as possible: allergic reactions like skin rash, itching or hives, swelling of the face, lips, or tongue low blood  counts - this medicine may decrease the number of white blood cells, red blood cells and platelets. You may be at increased risk for infections and bleeding. signs of infection - fever or chills, cough, sore throat, pain or difficulty passing urine signs of decreased platelets or bleeding - bruising, pinpoint red spots on the skin, black, tarry stools, blood in the urine signs of decreased red blood cells - unusually weak or tired, fainting spells, lightheadedness breathing problems changes in vision chest pain mouth sores nausea and vomiting pain, swelling, redness at site where injected pain, tingling, numbness in the hands or feet redness, swelling, or sores on hands or feet stomach pain unusual bleeding Side effects that usually do not require medical attention (report to your doctor or health care professional if they continue or are bothersome): changes in finger or toe nails diarrhea dry or itchy skin hair loss headache loss of appetite sensitivity of eyes to the light stomach upset unusually teary eyes This list may not describe all possible side effects. Call your doctor for medical advice about side effects. You may report side effects to FDA at  1-800-FDA-1088. Where should I keep my medication? This drug is given in a hospital or clinic and will not be stored at home. NOTE: This sheet is a summary. It may not cover all possible information. If you have questions about this medicine, talk to your doctor, pharmacist, or health care provider.  2022 Elsevier/Gold Standard (2019-08-10 15:00:03)  The chemotherapy medication bag should finish at 46 hours, 96 hours, or 7 days. For example, if your pump is scheduled for 46 hours and it was put on at 4:00 p.m., it should finish at 2:00 p.m. the day it is scheduled to come off regardless of your appointment time.     Estimated time to finish at 12:30 p.m. on Thursday 07/26/2021.   If the display on your pump reads "Low Volume" and it is beeping, take the batteries out of the pump and come to the cancer center for it to be taken off.   If the pump alarms go off prior to the pump reading "Low Volume" then call [phone] and someone can assist you.  If the plunger comes out and the chemotherapy medication is leaking out, please use your home chemo spill kit to clean up the spill. Do NOT use paper towels or other household products.  If you have problems or questions regarding your pump, please call either [phone] (24 hours a day) or the cancer center Monday-Friday 8:00 a.m.- 4:30 p.m. at the clinic number and we will assist you. If you are unable to get assistance, then go to the nearest Emergency Department and ask the staff to contact the IV team for assistance.

## 2021-07-24 NOTE — Telephone Encounter (Signed)
Patient seen by Dr. Sherrill today ? ?Vitals are within treatment parameters. ? ?Labs reviewed by Dr. Sherrill and are within treatment parameters. ? ?Per physician team, patient is ready for treatment and there are NO modifications to the treatment plan.  ?

## 2021-07-25 ENCOUNTER — Encounter: Payer: Self-pay | Admitting: Family Medicine

## 2021-07-26 ENCOUNTER — Other Ambulatory Visit: Payer: Self-pay

## 2021-07-26 ENCOUNTER — Inpatient Hospital Stay: Payer: Medicare Other

## 2021-07-26 VITALS — BP 132/62 | HR 69 | Temp 97.7°F | Resp 18

## 2021-07-26 DIAGNOSIS — C20 Malignant neoplasm of rectum: Secondary | ICD-10-CM

## 2021-07-26 DIAGNOSIS — Z5111 Encounter for antineoplastic chemotherapy: Secondary | ICD-10-CM | POA: Diagnosis not present

## 2021-07-26 MED ORDER — HEPARIN SOD (PORK) LOCK FLUSH 100 UNIT/ML IV SOLN
500.0000 [IU] | Freq: Once | INTRAVENOUS | Status: AC | PRN
  Administered 2021-07-26: 500 [IU]

## 2021-07-26 MED ORDER — SODIUM CHLORIDE 0.9% FLUSH
10.0000 mL | INTRAVENOUS | Status: DC | PRN
  Administered 2021-07-26: 10 mL

## 2021-07-26 NOTE — Patient Instructions (Signed)
Implanted Port Home Guide An implanted port is a device that is placed under the skin. It is usually placed in the chest. The device can be used to give IV medicine, to take blood, or for dialysis. You may have an implanted port if: You need IV medicine that would be irritating to the small veins in your hands or arms. You need IV medicines, such as antibiotics, for a long period of time. You need IV nutrition for a long period of time. You need dialysis. When you have a port, your health care provider can choose to use the port instead of veins in your arms for these procedures. You may have fewer limitations when using a port than you would if you used other types of long-term IVs, and you will likely be able to return to normal activities after your incision heals. An implanted port has two main parts: Reservoir. The reservoir is the part where a needle is inserted to give medicines or draw blood. The reservoir is round. After it is placed, it appears as a small, raised area under your skin. Catheter. The catheter is a thin, flexible tube that connects the reservoir to a vein. Medicine that is inserted into the reservoir goes into the catheter and then into the vein. How is my port accessed? To access your port: A numbing cream may be placed on the skin over the port site. Your health care provider will put on a mask and sterile gloves. The skin over your port will be cleaned carefully with a germ-killing soap and allowed to dry. Your health care provider will gently pinch the port and insert a needle into it. Your health care provider will check for a blood return to make sure the port is in the vein and is not clogged. If your port needs to remain accessed to get medicine continuously (constant infusion), your health care provider will place a clear bandage (dressing) over the needle site. The dressing and needle will need to be changed every week, or as told by your health care provider. What  is flushing? Flushing helps keep the port from getting clogged. Follow instructions from your health care provider about how and when to flush the port. Ports are usually flushed with saline solution or a medicine called heparin. The need for flushing will depend on how the port is used: If the port is only used from time to time to give medicines or draw blood, the port may need to be flushed: Before and after medicines have been given. Before and after blood has been drawn. As part of routine maintenance. Flushing may be recommended every 4-6 weeks. If a constant infusion is running, the port may not need to be flushed. Throw away any syringes in a disposal container that is meant for sharp items (sharps container). You can buy a sharps container from a pharmacy, or you can make one by using an empty hard plastic bottle with a cover. How long will my port stay implanted? The port can stay in for as long as your health care provider thinks it is needed. When it is time for the port to come out, a surgery will be done to remove it. The surgery will be similar to the procedure that was done to put the port in. Follow these instructions at home:  Flush your port as told by your health care provider. If you need an infusion over several days, follow instructions from your health care provider about how   to take care of your port site. Make sure you: Wash your hands with soap and water before you change your dressing. If soap and water are not available, use alcohol-based hand sanitizer. Change your dressing as told by your health care provider. Place any used dressings or infusion bags into a plastic bag. Throw that bag in the trash. Keep the dressing that covers the needle clean and dry. Do not get it wet. Do not use scissors or sharp objects near the tube. Keep the tube clamped, unless it is being used. Check your port site every day for signs of infection. Check for: Redness, swelling, or  pain. Fluid or blood. Pus or a bad smell. Protect the skin around the port site. Avoid wearing bra straps that rub or irritate the site. Protect the skin around your port from seat belts. Place a soft pad over your chest if needed. Bathe or shower as told by your health care provider. The site may get wet as long as you are not actively receiving an infusion. Return to your normal activities as told by your health care provider. Ask your health care provider what activities are safe for you. Carry a medical alert card or wear a medical alert bracelet at all times. This will let health care providers know that you have an implanted port in case of an emergency. Get help right away if: You have redness, swelling, or pain at the port site. You have fluid or blood coming from your port site. You have pus or a bad smell coming from the port site. You have a fever. Summary Implanted ports are usually placed in the chest for long-term IV access. Follow instructions from your health care provider about flushing the port and changing bandages (dressings). Take care of the area around your port by avoiding clothing that puts pressure on the area, and by watching for signs of infection. Protect the skin around your port from seat belts. Place a soft pad over your chest if needed. Get help right away if you have a fever or you have redness, swelling, pain, drainage, or a bad smell at the port site. This information is not intended to replace advice given to you by your health care provider. Make sure you discuss any questions you have with your health care provider. Document Revised: 11/29/2020 Document Reviewed: 01/24/2020 Elsevier Patient Education  2022 [employer]  

## 2021-07-31 ENCOUNTER — Telehealth: Payer: Self-pay | Admitting: *Deleted

## 2021-07-31 ENCOUNTER — Ambulatory Visit: Payer: Medicare Other | Admitting: Rehabilitative and Restorative Service Providers"

## 2021-07-31 ENCOUNTER — Encounter: Payer: Self-pay | Admitting: *Deleted

## 2021-07-31 NOTE — Progress Notes (Signed)
PATIENT NAVIGATOR PROGRESS NOTE  Name: Neil Perry Date: 07/31/2021 MRN: [id number]  DOB: Jan 14, 1941   Reason for visit: Phone call with Mrs. Slingerland  Comments:  Mrs. Dwiggins called with concerns of patient's level of fatigue. We discussed that fatigue is a cumulative side effect of chemo and that as his blood counts normalize after completion of chemo that will improve in the meantime walking and light weights may help. She also had concerns over weight loss and lack of appetite. We discussed maximizing caloric intake and eating small amounts more frequently. Will ask that dietician also contact Mrs Gentle for more resources and information     Time spent counseling/coordinating care: 15-30 minutes

## 2021-08-01 ENCOUNTER — Encounter: Payer: Medicare Other | Admitting: Nutrition

## 2021-08-01 ENCOUNTER — Telehealth: Payer: Self-pay | Admitting: Nutrition

## 2021-08-01 NOTE — Telephone Encounter (Signed)
Received request to contact patient by telephone.  Nutrition follow up completed with patient and wife. Patient receiving FOLFOX for Rectal cancer. Weight decreased to 165.6 pounds on Nov 1 from 175 pounds Sept 20. This is 5% wt loss over 6 weeks. Patient is weak and "almost fell" per wife after they went to [place]. He has altered taste and cold sensitivity. Reports he has "copious" amounts of stool between 4-8 times daily. He describes stools as dry and "sausage like". Wife has a [facility] Stool Chart for him to better describe his stools. Wife wants him to drink celery juice and vegetable smoothies.  Patients wants to have the occasional sweet.  Estimated Nutrition Needs: 2050-2350 kcal, 108-120 gm protein, 2.2 L fluid  Nutrition Diagnosis: Unintended wt loss continues.  Intervention: Educated to increase calories and protein in small amounts throughout the day. Recommended NI[employer] Essentials mixed with Fair life whole milk BID. This should provide ~~280 kcal and 18 gm protein. Asked patient to keep a log of stool output for MD and discuss at the next visit. Stressed importance of eating and drinking to avoid further wt loss/malnutrition.  Monitoring, Evaluation, goals: Patient will work to increase calories and protein to minimize wt loss.  Next Visit: Telephone call on Wednesday Nov 16.

## 2021-08-02 ENCOUNTER — Encounter: Payer: Self-pay | Admitting: *Deleted

## 2021-08-02 NOTE — Progress Notes (Signed)
PATIENT NAVIGATOR PROGRESS NOTE  Name: [name] Date: 08/02/2021 MRN: [id number]  DOB: 1940-10-01   Reason for visit: Returning call from patient and his wife  Comments:  Spoke with Neil and Mrs Whyte briefly as they were leaving for a [facility] appt. Neil Perry describes feeling weak and wobbly when getting up from seated position. He describes painful urination as the reason he is going to [facility] to give a urine specimen. He doesn't describe fever. He is able to eat and drink but his wife describes that he is not drinking enough.  Encouraged him to use assistance with walking to prevent falls, drink at least 64 oz caffeine free fluids a day, to keep [place] informed of urine analysis results as he may have a UTI which would contribute to his symptoms. Neil and Mrs Fill verbalized understanding     Time spent counseling/coordinating care: 15-30 minutes

## 2021-08-05 ENCOUNTER — Other Ambulatory Visit: Payer: Self-pay | Admitting: Oncology

## 2021-08-07 ENCOUNTER — Inpatient Hospital Stay: Payer: Medicare Other

## 2021-08-07 ENCOUNTER — Other Ambulatory Visit: Payer: Self-pay

## 2021-08-07 ENCOUNTER — Encounter: Payer: Self-pay | Admitting: Oncology

## 2021-08-07 ENCOUNTER — Inpatient Hospital Stay (HOSPITAL_BASED_OUTPATIENT_CLINIC_OR_DEPARTMENT_OTHER): Payer: Medicare Other | Admitting: Oncology

## 2021-08-07 VITALS — BP 144/82 | HR 73 | Temp 98.1°F | Resp 20 | Ht 69.0 in | Wt 164.0 lb

## 2021-08-07 DIAGNOSIS — C20 Malignant neoplasm of rectum: Secondary | ICD-10-CM

## 2021-08-07 DIAGNOSIS — Z5111 Encounter for antineoplastic chemotherapy: Secondary | ICD-10-CM | POA: Diagnosis not present

## 2021-08-07 LAB — CMP (CANCER CENTER ONLY)
ALT: 23 U/L (ref 0–44)
AST: 21 U/L (ref 15–41)
Albumin: 3.8 g/dL (ref 3.5–5.0)
Alkaline Phosphatase: 56 U/L (ref 38–126)
Anion gap: 7 (ref 5–15)
BUN: 19 mg/dL (ref 8–23)
CO2: 24 mmol/L (ref 22–32)
Calcium: 9.6 mg/dL (ref 8.9–10.3)
Chloride: 109 mmol/L (ref 98–111)
Creatinine: 0.96 mg/dL (ref 0.61–1.24)
GFR, Estimated: >60 mL/min (ref >60)
Glucose, Bld: 112 mg/dL — ABNORMAL HIGH (ref 70–99)
Potassium: 4 mmol/L (ref 3.5–5.1)
Sodium: 140 mmol/L (ref 135–145)
Total Bilirubin: 0.6 mg/dL (ref 0.3–1.2)
Total Protein: 6 g/dL — ABNORMAL LOW (ref 6.5–8.1)

## 2021-08-07 LAB — CBC WITH DIFFERENTIAL (CANCER CENTER ONLY)
Abs Immature Granulocytes: 0.01 10*3/uL (ref 0.00–0.07)
Basophils Absolute: 0 10*3/uL (ref 0.0–0.1)
Basophils Relative: 1 %
Eosinophils Absolute: 0 10*3/uL (ref 0.0–0.5)
Eosinophils Relative: 2 %
HCT: 31.8 % — ABNORMAL LOW (ref 39.0–52.0)
Hemoglobin: 10.6 g/dL — ABNORMAL LOW (ref 13.0–17.0)
Immature Granulocytes: 1 %
Lymphocytes Relative: 23 %
Lymphs Abs: 0.5 10*3/uL — ABNORMAL LOW (ref 0.7–4.0)
MCH: 28.3 pg (ref 26.0–34.0)
MCHC: 33.3 g/dL (ref 30.0–36.0)
MCV: 85 fL (ref 80.0–100.0)
Monocytes Absolute: 0.3 10*3/uL (ref 0.1–1.0)
Monocytes Relative: 16 %
Neutro Abs: 1.3 10*3/uL — ABNORMAL LOW (ref 1.7–7.7)
Neutrophils Relative %: 57 %
Platelet Count: 132 10*3/uL — ABNORMAL LOW (ref 150–400)
RBC: 3.74 MIL/uL — ABNORMAL LOW (ref 4.22–5.81)
RDW: 21.6 % — ABNORMAL HIGH (ref 11.5–15.5)
WBC Count: 2.2 10*3/uL — ABNORMAL LOW (ref 4.0–10.5)
nRBC: 0 % (ref 0.0–0.2)

## 2021-08-07 LAB — CEA (ACCESS): CEA (CHCC): 28.79 ng/mL — ABNORMAL HIGH (ref 0.00–5.00)

## 2021-08-07 MED ORDER — OXALIPLATIN CHEMO INJECTION 100 MG/20ML
50.0000 mg/m2 | Freq: Once | INTRAVENOUS | Status: AC
  Administered 2021-08-07: 95 mg via INTRAVENOUS
  Filled 2021-08-07: qty 19

## 2021-08-07 MED ORDER — LEUCOVORIN CALCIUM INJECTION 350 MG
400.0000 mg/m2 | Freq: Once | INTRAVENOUS | Status: AC
  Administered 2021-08-07: 764 mg via INTRAVENOUS
  Filled 2021-08-07: qty 38.2

## 2021-08-07 MED ORDER — DEXTROSE 5 % IV SOLN: Freq: Once | INTRAVENOUS | Status: AC

## 2021-08-07 MED ORDER — SODIUM CHLORIDE 0.9 % IV SOLN
10.0000 mg | Freq: Once | INTRAVENOUS | Status: AC
  Administered 2021-08-07: 10 mg via INTRAVENOUS
  Filled 2021-08-07: qty 1

## 2021-08-07 MED ORDER — SODIUM CHLORIDE 0.9 % IV SOLN
2000.0000 mg/m2 | INTRAVENOUS | Status: DC
  Administered 2021-08-07: 3800 mg via INTRAVENOUS
  Filled 2021-08-07: qty 76

## 2021-08-07 MED ORDER — PALONOSETRON HCL INJECTION 0.25 MG/5ML
0.2500 mg | Freq: Once | INTRAVENOUS | Status: AC
  Administered 2021-08-07: 0.25 mg via INTRAVENOUS
  Filled 2021-08-07: qty 5

## 2021-08-07 NOTE — Progress Notes (Signed)
Patient seen by Dr. [name] today  Vitals are all within treatment parameters  Labs reviewed by Dr. [name] and are not all within treatment parameters. ANC 1..  Per physician team, patient is ready for treatment. Please note that modifications are being made to the treatment plan including Dose reduction this cycle. Will add gcsf to next treatment.

## 2021-08-07 NOTE — Patient Instructions (Signed)

## 2021-08-07 NOTE — Patient Instructions (Addendum)
[facility]   Discharge Instructions: Thank you for choosing [facility] to provide your oncology and hematology care.   If you have a lab appointment with the [facility], please go directly to the [facility] and check in at the registration area.   Wear comfortable clothing and clothing appropriate for easy access to any Portacath or PICC line.   We strive to give you quality time with your provider. You may need to reschedule your appointment if you arrive late (15 or more minutes).  Arriving late affects you and other patients whose appointments are after yours.  Also, if you miss three or more appointments without notifying the office, you may be dismissed from the clinic at the provider's discretion.      For prescription refill requests, have your pharmacy contact our office and allow 72 hours for refills to be completed.    Today you received the following chemotherapy and/or immunotherapy agents Oxaliplatin (ELOXATIN), Leucovorin & Flourouracil (ADRUCIL).      To help prevent nausea and vomiting after your treatment, we encourage you to take your nausea medication as directed.  BELOW ARE SYMPTOMS THAT SHOULD BE REPORTED IMMEDIATELY: *FEVER GREATER THAN 100.4 F (38 C) OR HIGHER *CHILLS OR SWEATING *NAUSEA AND VOMITING THAT IS NOT CONTROLLED WITH YOUR NAUSEA MEDICATION *UNUSUAL SHORTNESS OF BREATH *UNUSUAL BRUISING OR BLEEDING *URINARY PROBLEMS (pain or burning when urinating, or frequent urination) *BOWEL PROBLEMS (unusual diarrhea, constipation, pain near the anus) TENDERNESS IN MOUTH AND THROAT WITH OR WITHOUT PRESENCE OF ULCERS (sore throat, sores in mouth, or a toothache) UNUSUAL RASH, SWELLING OR PAIN  UNUSUAL VAGINAL DISCHARGE OR ITCHING   Items with * indicate a potential emergency and should be followed up as soon as possible or go to the Emergency Department if any problems should occur.  Please show the CHEMOTHERAPY ALERT  CARD or IMMUNOTHERAPY ALERT CARD at check-in to the Emergency Department and triage nurse.  Should you have questions after your visit or need to cancel or reschedule your appointment, please contact [facility]  Dept: [phone]  and follow the prompts.  Office hours are 8:00 a.m. to 4:30 p.m. Monday - Friday. Please note that voicemails left after 4:00 p.m. may not be returned until the following business day.  We are closed weekends and major holidays. You have access to a nurse at all times for urgent questions. Please call the main number to the clinic Dept: [phone] and follow the prompts.   For any non-urgent questions, you may also contact your provider using MyChart. We now offer e-Visits for anyone 34 and older to request care online for non-urgent symptoms. For details visit [email or web address].   Also download the MyChart app! Go to the app store, search "MyChart", open the app, select [facility], and log in with your MyChart username and password.  Due to Covid, a mask is required upon entering the hospital/clinic. If you do not have a mask, one will be given to you upon arrival. For doctor visits, patients may have 1 support person aged 67 or older with them. For treatment visits, patients cannot have anyone with them due to current Covid guidelines and our immunocompromised population.   Oxaliplatin Injection What is this medication? OXALIPLATIN (ox AL i PLA tin) is a chemotherapy drug. It targets fast dividing cells, like cancer cells, and causes these cells to die. This medicine is used to treat cancers of the colon and rectum, and  many other cancers. This medicine may be used for other purposes; ask your health care provider or pharmacist if you have questions. COMMON BRAND NAME(S): Eloxatin What should I tell my care team before I take this medication? They need to know if you have any of these conditions: heart disease history of irregular  heartbeat liver disease low blood counts, like white cells, platelets, or red blood cells lung or breathing disease, like asthma take medicines that treat or prevent blood clots tingling of the fingers or toes, or other nerve disorder an unusual or allergic reaction to oxaliplatin, other chemotherapy, other medicines, foods, dyes, or preservatives pregnant or trying to get pregnant breast-feeding How should I use this medication? This drug is given as an infusion into a vein. It is administered in a hospital or clinic by a specially trained health care professional. Talk to your pediatrician regarding the use of this medicine in children. Special care may be needed. Overdosage: If you think you have taken too much of this medicine contact a poison control center or emergency room at once. NOTE: This medicine is only for you. Do not share this medicine with others. What if I miss a dose? It is important not to miss a dose. Call your doctor or health care professional if you are unable to keep an appointment. What may interact with this medication? Do not take this medicine with any of the following medications: cisapride dronedarone pimozide thioridazine This medicine may also interact with the following medications: aspirin and aspirin-like medicines certain medicines that treat or prevent blood clots like warfarin, apixaban, dabigatran, and rivaroxaban cisplatin cyclosporine diuretics medicines for infection like acyclovir, adefovir, amphotericin B, bacitracin, cidofovir, foscarnet, ganciclovir, gentamicin, pentamidine, vancomycin NSAIDs, medicines for pain and inflammation, like ibuprofen or naproxen other medicines that prolong the QT interval (an abnormal heart rhythm) pamidronate zoledronic acid This list may not describe all possible interactions. Give your health care provider a list of all the medicines, herbs, non-prescription drugs, or dietary supplements you use. Also tell  them if you smoke, drink alcohol, or use illegal drugs. Some items may interact with your medicine. What should I watch for while using this medication? Your condition will be monitored carefully while you are receiving this medicine. You may need blood work done while you are taking this medicine. This medicine may make you feel generally unwell. This is not uncommon as chemotherapy can affect healthy cells as well as cancer cells. Report any side effects. Continue your course of treatment even though you feel ill unless your healthcare professional tells you to stop. This medicine can make you more sensitive to cold. Do not drink cold drinks or use ice. Cover exposed skin before coming in contact with cold temperatures or cold objects. When out in cold weather wear warm clothing and cover your mouth and nose to warm the air that goes into your lungs. Tell your doctor if you get sensitive to the cold. Do not become pregnant while taking this medicine or for 9 months after stopping it. Women should inform their health care professional if they wish to become pregnant or think they might be pregnant. Men should not father a child while taking this medicine and for 6 months after stopping it. There is potential for serious side effects to an unborn child. Talk to your health care professional for more information. Do not breast-feed a child while taking this medicine or for 3 months after stopping it. This medicine has caused ovarian failure in  some women. This medicine may make it more difficult to get pregnant. Talk to your health care professional if you are concerned about your fertility. This medicine has caused decreased sperm counts in some men. This may make it more difficult to father a child. Talk to your health care professional if you are concerned about your fertility. This medicine may increase your risk of getting an infection. Call your health care professional for advice if you get a fever,  chills, or sore throat, or other symptoms of a cold or flu. Do not treat yourself. Try to avoid being around people who are sick. Avoid taking medicines that contain aspirin, acetaminophen, ibuprofen, naproxen, or ketoprofen unless instructed by your health care professional. These medicines may hide a fever. Be careful brushing or flossing your teeth or using a toothpick because you may get an infection or bleed more easily. If you have any dental work done, tell your dentist you are receiving this medicine. What side effects may I notice from receiving this medication? Side effects that you should report to your doctor or health care professional as soon as possible: allergic reactions like skin rash, itching or hives, swelling of the face, lips, or tongue breathing problems cough low blood counts - this medicine may decrease the number of white blood cells, red blood cells, and platelets. You may be at increased risk for infections and bleeding nausea, vomiting pain, redness, or irritation at site where injected pain, tingling, numbness in the hands or feet signs and symptoms of bleeding such as bloody or black, tarry stools; red or dark brown urine; spitting up blood or brown material that looks like coffee grounds; red spots on the skin; unusual bruising or bleeding from the eyes, gums, or nose signs and symptoms of a dangerous change in heartbeat or heart rhythm like chest pain; dizziness; fast, irregular heartbeat; palpitations; feeling faint or lightheaded; falls signs and symptoms of infection like fever; chills; cough; sore throat; pain or trouble passing urine signs and symptoms of liver injury like dark yellow or brown urine; general ill feeling or flu-like symptoms; light-colored stools; loss of appetite; nausea; right upper belly pain; unusually weak or tired; yellowing of the eyes or skin signs and symptoms of low red blood cells or anemia such as unusually weak or tired; feeling faint  or lightheaded; falls signs and symptoms of muscle injury like dark urine; trouble passing urine or change in the amount of urine; unusually weak or tired; muscle pain; back pain Side effects that usually do not require medical attention (report to your doctor or health care professional if they continue or are bothersome): changes in taste diarrhea gas hair loss loss of appetite mouth sores This list may not describe all possible side effects. Call your doctor for medical advice about side effects. You may report side effects to FDA at 1-800-FDA-1088. Where should I keep my medication? This drug is given in a hospital or clinic and will not be stored at home. NOTE: This sheet is a summary. It may not cover all possible information. If you have questions about this medicine, talk to your doctor, pharmacist, or health care provider.  2022 Elsevier/Gold Standard (2021-05-29 00:00:00)  Leucovorin injection What is this medication? LEUCOVORIN (loo koe VOR in) is used to prevent or treat the harmful effects of some medicines. This medicine is used to treat anemia caused by a low amount of folic acid in the body. It is also used with 5-fluorouracil (5-FU) to treat colon  cancer. This medicine may be used for other purposes; ask your health care provider or pharmacist if you have questions. What should I tell my care team before I take this medication? They need to know if you have any of these conditions: anemia from low levels of vitamin B-12 in the blood an unusual or allergic reaction to leucovorin, folic acid, other medicines, foods, dyes, or preservatives pregnant or trying to get pregnant breast-feeding How should I use this medication? This medicine is for injection into a muscle or into a vein. It is given by a health care professional in a hospital or clinic setting. Talk to your pediatrician regarding the use of this medicine in children. Special care may be needed. Overdosage: If you  think you have taken too much of this medicine contact a poison control center or emergency room at once. NOTE: This medicine is only for you. Do not share this medicine with others. What if I miss a dose? This does not apply. What may interact with this medication? capecitabine fluorouracil phenobarbital phenytoin primidone trimethoprim-sulfamethoxazole This list may not describe all possible interactions. Give your health care provider a list of all the medicines, herbs, non-prescription drugs, or dietary supplements you use. Also tell them if you smoke, drink alcohol, or use illegal drugs. Some items may interact with your medicine. What should I watch for while using this medication? Your condition will be monitored carefully while you are receiving this medicine. This medicine may increase the side effects of 5-fluorouracil, 5-FU. Tell your doctor or health care professional if you have diarrhea or mouth sores that do not get better or that get worse. What side effects may I notice from receiving this medication? Side effects that you should report to your doctor or health care professional as soon as possible: allergic reactions like skin rash, itching or hives, swelling of the face, lips, or tongue breathing problems fever, infection mouth sores unusual bleeding or bruising unusually weak or tired Side effects that usually do not require medical attention (report to your doctor or health care professional if they continue or are bothersome): constipation or diarrhea loss of appetite nausea, vomiting This list may not describe all possible side effects. Call your doctor for medical advice about side effects. You may report side effects to FDA at 1-800-FDA-1088. Where should I keep my medication? This drug is given in a hospital or clinic and will not be stored at home. NOTE: This sheet is a summary. It may not cover all possible information. If you have questions about this  medicine, talk to your doctor, pharmacist, or health care provider.  2022 Elsevier/Gold Standard (2008-03-17 00:00:00)  Fluorouracil, 5-FU injection What is this medication? FLUOROURACIL, 5-FU (flure oh YOOR a sil) is a chemotherapy drug. It slows the growth of cancer cells. This medicine is used to treat many types of cancer like breast cancer, colon or rectal cancer, pancreatic cancer, and stomach cancer. This medicine may be used for other purposes; ask your health care provider or pharmacist if you have questions. COMMON BRAND NAME(S): Adrucil What should I tell my care team before I take this medication? They need to know if you have any of these conditions: blood disorders dihydropyrimidine dehydrogenase (DPD) deficiency infection (especially a virus infection such as chickenpox, cold sores, or herpes) kidney disease liver disease malnourished, poor nutrition recent or ongoing radiation therapy an unusual or allergic reaction to fluorouracil, other chemotherapy, other medicines, foods, dyes, or preservatives pregnant or trying to get pregnant  breast-feeding How should I use this medication? This drug is given as an infusion or injection into a vein. It is administered in a hospital or clinic by a specially trained health care professional. Talk to your pediatrician regarding the use of this medicine in children. Special care may be needed. Overdosage: If you think you have taken too much of this medicine contact a poison control center or emergency room at once. NOTE: This medicine is only for you. Do not share this medicine with others. What if I miss a dose? It is important not to miss your dose. Call your doctor or health care professional if you are unable to keep an appointment. What may interact with this medication? Do not take this medicine with any of the following medications: live virus vaccines This medicine may also interact with the following medications: medicines  that treat or prevent blood clots like warfarin, enoxaparin, and dalteparin This list may not describe all possible interactions. Give your health care provider a list of all the medicines, herbs, non-prescription drugs, or dietary supplements you use. Also tell them if you smoke, drink alcohol, or use illegal drugs. Some items may interact with your medicine. What should I watch for while using this medication? Visit your doctor for checks on your progress. This drug may make you feel generally unwell. This is not uncommon, as chemotherapy can affect healthy cells as well as cancer cells. Report any side effects. Continue your course of treatment even though you feel ill unless your doctor tells you to stop. In some cases, you may be given additional medicines to help with side effects. Follow all directions for their use. Call your doctor or health care professional for advice if you get a fever, chills or sore throat, or other symptoms of a cold or flu. Do not treat yourself. This drug decreases your body's ability to fight infections. Try to avoid being around people who are sick. This medicine may increase your risk to bruise or bleed. Call your doctor or health care professional if you notice any unusual bleeding. Be careful brushing and flossing your teeth or using a toothpick because you may get an infection or bleed more easily. If you have any dental work done, tell your dentist you are receiving this medicine. Avoid taking products that contain aspirin, acetaminophen, ibuprofen, naproxen, or ketoprofen unless instructed by your doctor. These medicines may hide a fever. Do not become pregnant while taking this medicine. Women should inform their doctor if they wish to become pregnant or think they might be pregnant. There is a potential for serious side effects to an unborn child. Talk to your health care professional or pharmacist for more information. Do not breast-feed an infant while taking  this medicine. Men should inform their doctor if they wish to father a child. This medicine may lower sperm counts. Do not treat diarrhea with over the counter products. Contact your doctor if you have diarrhea that lasts more than 2 days or if it is severe and watery. This medicine can make you more sensitive to the sun. Keep out of the sun. If you cannot avoid being in the sun, wear protective clothing and use sunscreen. Do not use sun lamps or tanning beds/booths. What side effects may I notice from receiving this medication? Side effects that you should report to your doctor or health care professional as soon as possible: allergic reactions like skin rash, itching or hives, swelling of the face, lips, or tongue low blood  counts - this medicine may decrease the number of white blood cells, red blood cells and platelets. You may be at increased risk for infections and bleeding. signs of infection - fever or chills, cough, sore throat, pain or difficulty passing urine signs of decreased platelets or bleeding - bruising, pinpoint red spots on the skin, black, tarry stools, blood in the urine signs of decreased red blood cells - unusually weak or tired, fainting spells, lightheadedness breathing problems changes in vision chest pain mouth sores nausea and vomiting pain, swelling, redness at site where injected pain, tingling, numbness in the hands or feet redness, swelling, or sores on hands or feet stomach pain unusual bleeding Side effects that usually do not require medical attention (report to your doctor or health care professional if they continue or are bothersome): changes in finger or toe nails diarrhea dry or itchy skin hair loss headache loss of appetite sensitivity of eyes to the light stomach upset unusually teary eyes This list may not describe all possible side effects. Call your doctor for medical advice about side effects. You may report side effects to FDA at  1-800-FDA-1088. Where should I keep my medication? This drug is given in a hospital or clinic and will not be stored at home. NOTE: This sheet is a summary. It may not cover all possible information. If you have questions about this medicine, talk to your doctor, pharmacist, or health care provider.  2022 [employer]/Gold Standard (2021-05-29 00:00:00)  The chemotherapy medication bag should finish at 46 hours, 96 hours, or 7 days. For example, if your pump is scheduled for 46 hours and it was put on at 4:00 p.m., it should finish at 2:00 p.m. the day it is scheduled to come off regardless of your appointment time.     Estimated time to finish at 12:45 p.m. on Thursday 08/09/21.   If the display on your pump reads "Low Volume" and it is beeping, take the batteries out of the pump and come to the cancer center for it to be taken off.   If the pump alarms go off prior to the pump reading "Low Volume" then call [phone] and someone can assist you.  If the plunger comes out and the chemotherapy medication is leaking out, please use your home chemo spill kit to clean up the spill. Do NOT use paper towels or other household products.  If you have problems or questions regarding your pump, please call either [phone] (24 hours a day) or the cancer center Monday-Friday 8:00 a.m.- 4:30 p.m. at the clinic number and we will assist you. If you are unable to get assistance, then go to the nearest Emergency Department and ask the staff to contact the IV team for assistance.

## 2021-08-07 NOTE — Progress Notes (Addendum)
[place] OFFICE PROGRESS NOTE   Diagnosis: Rectal cancer  INTERVAL HISTORY:   Neil Perry completed another cycle of FOLFOX on 07/24/2021.  He reports altered taste and cold sensitivity.  No peripheral numbness.  No mouth sores, nausea, or diarrhea.  Dizzy spells improved after he increased fluid intake.  He continues to have skin discoloration at the penis.  Objective:  Vital signs in last 24 hours:  Blood pressure (!) 144/82, pulse 73, temperature 98.1 F (36.7 C), temperature source Oral, resp. rate 20, height $RemoveBe'5\' 9"'peXQoOVqc$  (1.753 m), weight 164 lb (74.4 kg), SpO2 98 %.    HEENT: No thrush or ulcers Resp: Lungs clear bilaterally Cardio: Regular rate and rhythm GI: No hepatosplenomegaly, nontender Vascular: No leg edema Neuro: Mild loss of vibratory sense at the fingertips bilaterally    Portacath/PICC-without erythema  Lab Results:  Lab Results  Component Value Date   WBC 2.2 (L) 08/07/2021   HGB 10.6 (L) 08/07/2021   HCT 31.8 (L) 08/07/2021   MCV 85.0 08/07/2021   PLT 132 (L) 08/07/2021   NEUTROABS 1.3 (L) 08/07/2021    CMP  Lab Results  Component Value Date   NA 140 08/07/2021   K 4.0 08/07/2021   CL 109 08/07/2021   CO2 24 08/07/2021   GLUCOSE 112 (H) 08/07/2021   BUN 19 08/07/2021   CREATININE 0.96 08/07/2021   CALCIUM 9.6 08/07/2021   PROT 6.0 (L) 08/07/2021   ALBUMIN 3.8 08/07/2021   AST 21 08/07/2021   ALT 23 08/07/2021   ALKPHOS 56 08/07/2021   BILITOT 0.6 08/07/2021   GFRNONAA >60 08/07/2021    Lab Results  Component Value Date   CEA 191.69 (H) 07/10/2021     Medications: I have reviewed the patient's current medications.   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 2-3 new hypervascular masses in the liver dome, no abdominal lymphadenopathy, stable benign left adrenal adenoma, stable right lower pole kidney mass Ultrasound-guided biopsy of a liver lesion 05/07/2021-metastatic adenocarcinoma with extensive necrosis,  immunohistochemical profile consistent with a colorectal primary; foundation 1-microsatellite stable, tumor mutation burden 5, K-rasG 12V, NRAS wildtype Colonoscopy 05/22/2021-ulcerated partially obstructing mass at 15 cm from anal verge CTs 05/30/2021-numerous small pulmonary nodules concerning for metastases, thickening of the rectum, multiple liver metastases Cycle 1 FOLFOX 06/12/2021 Cycle 2 FOLFOX 06/26/2021 Cycle 3 FOLFOX 07/10/2021, oxaliplatin dose reduced due to progressive decline in the [place] and platelet count Cycle 4 FOLFOX 07/24/2021 Cycle 5 FOLFOX 08/07/2021, 5-FU bolus eliminated and oxaliplatin dose reduced, insurance would not approve Udenyca 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  Right renal mass consistent with a renal cell carcinoma-stable on MRI abdomen 8/3/20224.  4.  Mitral valve prolapse 5.  Family history of prostate cancer       Disposition: Neil Perry appears stable.  He is developing early oxaliplatin neuropathy.  He will complete another cycle of FOLFOX today.  He will undergo restaging CTs after this cycle.  He will return for an office visit and chemotherapy in 2 weeks.  He has  mild neutropenia today.  The plan is to add G-CSF with this cycle.  We reviewed potential toxicities associated with G-CSF including the chance of rash, bone pain, and splenic rupture.  He agrees to proceed.  We will dose reduce the  oxaliplatin and eliminate the 5-FU bolus if his insurance will not approve G-CSF.  Neil Coder, MD  08/07/2021  10:15 AM

## 2021-08-07 NOTE — Progress Notes (Signed)
Patient presents for treatment. RN assessment completed along with the following:  Labs/vitals reviewed - Yes, and ANC 1.3, okay to treat per Dr. [name] with dose reduction.    Weight within 10% of previous measurement - Yes Oncology Treatment Attestation completed for current therapy- Yes, on date 06/05/2021 Informed consent completed and reflects current therapy/intent - Yes, on date 07/12/2021             Provider progress note reviewed - Yes, today's provider note was reviewed. Treatment/Antibody/Supportive plan reviewed - Yes, and Dose reduce Oxaliplatin and eliminate 5-FU bolus schedule for G-CSF due to mild neutropenia S&H and other orders reviewed - Yes, and there are no additional orders identified. Previous treatment date reviewed - Yes, and the appropriate amount of time has elapsed between treatments. Clinic Hand Off Received from - Yes, Susan-C, RN  Patient to proceed with treatment.

## 2021-08-07 NOTE — Addendum Note (Signed)
Addended by: Betsy Coder B on: 08/07/2021 11:10 AM   Modules accepted: Orders

## 2021-08-08 ENCOUNTER — Encounter: Payer: Medicare Other | Admitting: Nutrition

## 2021-08-08 ENCOUNTER — Telehealth: Payer: Self-pay | Admitting: Nutrition

## 2021-08-08 NOTE — Telephone Encounter (Signed)
Telephone follow up completed with patient and wife. Patient receiving FOLFOX for rectal cancer.  Weight documented as 164 pounds on November 15, decreased from 165.6 pounds on November 1. Patient believes he is eating more.  Continues to have altered taste. Denies diarrhea. States stools are inconsistent but they are formed.  Remains on Colace per MD. He is eating more eggs and meat. Continues Enterade BID. States he uses this a meal replacement when he can't eat. Trying to drink more fluid.  Nutrition Diagnosis: Unintended wt loss continues.  Intervention: Educated to increase calories and continue high protein foods. Encouraged patient to continue Enterade bid but educated that this is not a meal replacement. It has ~5 calories and 1 gram protein in 8 oz. This product is designed to manage and maintain GI function, hydrates and rebuilds and protects the gut lining. Suggested patient try a high calorie, high protein shake for added calories. Educated on strategies for improving altered taste. Will mail fact sheets.  Monitoring, Evaluation, Goals: Patient will increase calories and protein to minimize further wt loss.  Next Visit: Will contact patient by phone on Wednesday, November 30.

## 2021-08-09 ENCOUNTER — Inpatient Hospital Stay: Payer: Medicare Other

## 2021-08-09 ENCOUNTER — Other Ambulatory Visit: Payer: Self-pay

## 2021-08-09 VITALS — BP 124/60 | HR 66 | Temp 98.7°F | Resp 20

## 2021-08-09 DIAGNOSIS — Z5111 Encounter for antineoplastic chemotherapy: Secondary | ICD-10-CM | POA: Diagnosis not present

## 2021-08-09 DIAGNOSIS — C20 Malignant neoplasm of rectum: Secondary | ICD-10-CM

## 2021-08-09 MED ORDER — PEGFILGRASTIM-BMEZ 6 MG/0.6ML ~~LOC~~ SOSY
6.0000 mg | PREFILLED_SYRINGE | Freq: Once | SUBCUTANEOUS | Status: AC
  Administered 2021-08-09: 13:00:00 6 mg via SUBCUTANEOUS

## 2021-08-09 MED ORDER — SODIUM CHLORIDE 0.9% FLUSH
10.0000 mL | INTRAVENOUS | Status: DC | PRN
  Administered 2021-08-09: 13:00:00 10 mL

## 2021-08-09 MED ORDER — HEPARIN SOD (PORK) LOCK FLUSH 100 UNIT/ML IV SOLN
500.0000 [IU] | Freq: Once | INTRAVENOUS | Status: AC | PRN
  Administered 2021-08-09: 13:00:00 500 [IU]

## 2021-08-09 NOTE — Patient Instructions (Signed)

## 2021-08-14 ENCOUNTER — Ambulatory Visit: Payer: Medicare Other | Admitting: Rehabilitative and Restorative Service Providers"

## 2021-08-15 ENCOUNTER — Emergency Department (HOSPITAL_BASED_OUTPATIENT_CLINIC_OR_DEPARTMENT_OTHER): Payer: Medicare Other

## 2021-08-15 ENCOUNTER — Ambulatory Visit (HOSPITAL_BASED_OUTPATIENT_CLINIC_OR_DEPARTMENT_OTHER): Admission: RE | Admit: 2021-08-15 | Payer: Medicare Other | Source: Ambulatory Visit

## 2021-08-15 ENCOUNTER — Encounter (HOSPITAL_BASED_OUTPATIENT_CLINIC_OR_DEPARTMENT_OTHER): Payer: Self-pay | Admitting: Emergency Medicine

## 2021-08-15 ENCOUNTER — Telehealth: Payer: Self-pay | Admitting: *Deleted

## 2021-08-15 ENCOUNTER — Observation Stay (HOSPITAL_BASED_OUTPATIENT_CLINIC_OR_DEPARTMENT_OTHER)
Admission: EM | Admit: 2021-08-15 | Discharge: 2021-08-16 | Disposition: A | Discharge status: Home / Self Care | Payer: Medicare Other | Attending: Internal Medicine | Admitting: Internal Medicine

## 2021-08-15 ENCOUNTER — Other Ambulatory Visit: Payer: Self-pay

## 2021-08-15 DIAGNOSIS — R161 Splenomegaly, not elsewhere classified: Secondary | ICD-10-CM | POA: Diagnosis not present

## 2021-08-15 DIAGNOSIS — Z20822 Contact with and (suspected) exposure to covid-19: Secondary | ICD-10-CM | POA: Diagnosis not present

## 2021-08-15 DIAGNOSIS — G459 Transient cerebral ischemic attack, unspecified: Secondary | ICD-10-CM | POA: Diagnosis not present

## 2021-08-15 DIAGNOSIS — R42 Dizziness and giddiness: Secondary | ICD-10-CM | POA: Diagnosis present

## 2021-08-15 DIAGNOSIS — Z8546 Personal history of malignant neoplasm of prostate: Secondary | ICD-10-CM | POA: Insufficient documentation

## 2021-08-15 DIAGNOSIS — C787 Secondary malignant neoplasm of liver and intrahepatic bile duct: Secondary | ICD-10-CM | POA: Insufficient documentation

## 2021-08-15 DIAGNOSIS — N183 Chronic kidney disease, stage 3 unspecified: Secondary | ICD-10-CM | POA: Diagnosis not present

## 2021-08-15 DIAGNOSIS — Z79899 Other long term (current) drug therapy: Secondary | ICD-10-CM | POA: Diagnosis not present

## 2021-08-15 DIAGNOSIS — R4781 Slurred speech: Secondary | ICD-10-CM

## 2021-08-15 DIAGNOSIS — N2889 Other specified disorders of kidney and ureter: Secondary | ICD-10-CM | POA: Insufficient documentation

## 2021-08-15 DIAGNOSIS — Z923 Personal history of irradiation: Secondary | ICD-10-CM | POA: Diagnosis not present

## 2021-08-15 DIAGNOSIS — D72829 Elevated white blood cell count, unspecified: Secondary | ICD-10-CM | POA: Insufficient documentation

## 2021-08-15 DIAGNOSIS — C20 Malignant neoplasm of rectum: Secondary | ICD-10-CM | POA: Diagnosis present

## 2021-08-15 DIAGNOSIS — D696 Thrombocytopenia, unspecified: Secondary | ICD-10-CM | POA: Insufficient documentation

## 2021-08-15 DIAGNOSIS — I129 Hypertensive chronic kidney disease with stage 1 through stage 4 chronic kidney disease, or unspecified chronic kidney disease: Secondary | ICD-10-CM | POA: Insufficient documentation

## 2021-08-15 DIAGNOSIS — I10 Essential (primary) hypertension: Secondary | ICD-10-CM | POA: Diagnosis present

## 2021-08-15 DIAGNOSIS — C801 Malignant (primary) neoplasm, unspecified: Secondary | ICD-10-CM

## 2021-08-15 LAB — CBC WITH DIFFERENTIAL/PLATELET
Abs Immature Granulocytes: 0 10*3/uL (ref 0.00–0.07)
Basophils Absolute: 0 10*3/uL (ref 0.0–0.1)
Basophils Relative: 0 %
Eosinophils Absolute: 0 10*3/uL (ref 0.0–0.5)
Eosinophils Relative: 0 %
HCT: 31.3 % — ABNORMAL LOW (ref 39.0–52.0)
Hemoglobin: 10.7 g/dL — ABNORMAL LOW (ref 13.0–17.0)
Lymphocytes Relative: 4 %
Lymphs Abs: 0.6 10*3/uL — ABNORMAL LOW (ref 0.7–4.0)
MCH: 29.2 pg (ref 26.0–34.0)
MCHC: 34.2 g/dL (ref 30.0–36.0)
MCV: 85.3 fL (ref 80.0–100.0)
Monocytes Absolute: 1.9 10*3/uL — ABNORMAL HIGH (ref 0.1–1.0)
Monocytes Relative: 12 %
Neutro Abs: 13.3 10*3/uL — ABNORMAL HIGH (ref 1.7–7.7)
Neutrophils Relative %: 84 %
Platelets: 108 10*3/uL — ABNORMAL LOW (ref 150–400)
RBC: 3.67 MIL/uL — ABNORMAL LOW (ref 4.22–5.81)
RDW: 21.6 % — ABNORMAL HIGH (ref 11.5–15.5)
WBC: 15.8 10*3/uL — ABNORMAL HIGH (ref 4.0–10.5)
nRBC: 0.6 % — ABNORMAL HIGH (ref 0.0–0.2)
nRBC: 2 /100 WBC — ABNORMAL HIGH

## 2021-08-15 LAB — COMPREHENSIVE METABOLIC PANEL
ALT: 25 U/L (ref 0–44)
AST: 27 U/L (ref 15–41)
Albumin: 3.8 g/dL (ref 3.5–5.0)
Alkaline Phosphatase: 135 U/L — ABNORMAL HIGH (ref 38–126)
Anion gap: 10 (ref 5–15)
BUN: 15 mg/dL (ref 8–23)
CO2: 24 mmol/L (ref 22–32)
Calcium: 9.6 mg/dL (ref 8.9–10.3)
Chloride: 102 mmol/L (ref 98–111)
Creatinine, Ser: 1.07 mg/dL (ref 0.61–1.24)
GFR, Estimated: >60 mL/min (ref >60)
Glucose, Bld: 99 mg/dL (ref 70–99)
Potassium: 3.5 mmol/L (ref 3.5–5.1)
Sodium: 136 mmol/L (ref 135–145)
Total Bilirubin: 0.7 mg/dL (ref 0.3–1.2)
Total Protein: 6 g/dL — ABNORMAL LOW (ref 6.5–8.1)

## 2021-08-15 LAB — RESP PANEL BY RT-PCR (FLU A&B, COVID) ARPGX2
Influenza A by PCR: NEGATIVE
Influenza B by PCR: NEGATIVE
SARS Coronavirus 2 by RT PCR: NEGATIVE

## 2021-08-15 MED ORDER — ACETAMINOPHEN 160 MG/5ML PO SOLN: 650.0000 mg | ORAL | Status: DC | PRN

## 2021-08-15 MED ORDER — ENSURE ENLIVE PO LIQD: 237.0000 mL | Freq: Two times a day (BID) | ORAL | Status: DC

## 2021-08-15 MED ORDER — STROKE: EARLY STAGES OF RECOVERY BOOK
Freq: Once | Status: AC
  Filled 2021-08-15: qty 1

## 2021-08-15 MED ORDER — IOHEXOL 300 MG/ML  SOLN
100.0000 mL | Freq: Once | INTRAMUSCULAR | Status: AC | PRN
  Administered 2021-08-15: 100 mL via INTRAVENOUS

## 2021-08-15 MED ORDER — ACETAMINOPHEN 650 MG RE SUPP: 650.0000 mg | RECTAL | Status: DC | PRN

## 2021-08-15 MED ORDER — SODIUM CHLORIDE 0.9% FLUSH
10.0000 mL | Freq: Two times a day (BID) | INTRAVENOUS | Status: DC
  Administered 2021-08-16: 10 mL

## 2021-08-15 MED ORDER — ASPIRIN 325 MG PO TABS
325.0000 mg | ORAL_TABLET | Freq: Every day | ORAL | Status: DC
Start: 2021-08-15 — End: 2021-08-15
  Administered 2021-08-15: 325 mg via ORAL
  Filled 2021-08-15: qty 1

## 2021-08-15 MED ORDER — ENOXAPARIN SODIUM 40 MG/0.4ML IJ SOSY
40.0000 mg | PREFILLED_SYRINGE | INTRAMUSCULAR | Status: DC
  Administered 2021-08-16: 40 mg via SUBCUTANEOUS
  Filled 2021-08-15: qty 0.4

## 2021-08-15 MED ORDER — SENNOSIDES-DOCUSATE SODIUM 8.6-50 MG PO TABS: 1.0000 | ORAL_TABLET | Freq: Every evening | ORAL | Status: DC | PRN

## 2021-08-15 MED ORDER — ACETAMINOPHEN 325 MG PO TABS: 650.0000 mg | ORAL_TABLET | ORAL | Status: DC | PRN

## 2021-08-15 MED ORDER — ASPIRIN EC 81 MG PO TBEC
81.0000 mg | DELAYED_RELEASE_TABLET | Freq: Every day | ORAL | Status: DC
  Administered 2021-08-16: 81 mg via ORAL
  Filled 2021-08-15: qty 1

## 2021-08-15 MED ORDER — CHLORHEXIDINE GLUCONATE CLOTH 2 % EX PADS
6.0000 | MEDICATED_PAD | Freq: Every day | CUTANEOUS | Status: DC
  Administered 2021-08-16: 6 via TOPICAL

## 2021-08-15 MED ORDER — ALUM & MAG HYDROXIDE-SIMETH 200-200-20 MG/5ML PO SUSP
15.0000 mL | Freq: Once | ORAL | Status: AC
  Administered 2021-08-15: 15 mL via ORAL
  Filled 2021-08-15: qty 30

## 2021-08-15 NOTE — ED Notes (Signed)
Pt went to bathroom and stated that when he got into the bathroom he felt weak. But was ok to walk back. Pt stated that he started to slur his speech. I came back into the room as the pt was sitting down in the room.  The Pt spoke two words and then the next two words were slurred and then the speech resolved. Pt shows no deficits MD notified. Pt taken to CT

## 2021-08-15 NOTE — ED Provider Notes (Signed)
[facility] EMERGENCY DEPT Provider Note   CSN: [id number] Arrival date & time: 08/15/21  1547     History Chief Complaint  Patient presents with   Dizziness    Neil Perry is a 80 y.o. male.  Patient presents chief complaint of episodes of slurred speech and weakness.  Patient states that he was headed to his appointment with his doctor when he felt weak and had to sit.  He felt his legs were like Jell-O had no strength.  Symptoms lasted for about 10 minutes.  Wife states that he also had slurred speech that lasted for about 1 minute and then all symptoms have resolved and is back at baseline.  He had a recurrent episode while here in the hospital that lasted about 2 to 3 minutes again and has since resolved.  Otherwise the patient denies any pain.  Denies any fall.  Denies headache or chest pain abdominal pain no fever no cough no vomiting no diarrhea reported.      Past Medical History:  Diagnosis Date   Cataract    Hypertension    Prostate cancer [facility])     Patient Active Problem List   Diagnosis Date Noted   Rectal cancer ([place]) 06/05/2021   CKD (chronic kidney disease) stage 3, GFR 30-59 ml/min (HCC) 03/04/2018   Labile hypertension 03/04/2018   Hyperglycemia 03/04/2018   Screening for deficiency anemia 01/28/2018   Screening for malignant neoplasm of colon 01/28/2018   Screening for malignant neoplasm of prostate 01/28/2018   Goals of care, counseling/discussion 01/28/2018   Mitral valve prolapse 01/28/2018   Mixed hyperlipidemia 01/28/2018   Bilateral cataracts 01/28/2018   Essential hypertension 01/28/2018   Malignant tumor of prostate ([place]) 01/28/2018    Past Surgical History:  Procedure Laterality Date   cataract surgery Right    IR IMAGING GUIDED PORT INSERTION  06/04/2021   LIVER BIOPSY     August 2022   PROSTATE BIOPSY     PROSTATECTOMY  2018       Family History  Problem Relation Age of Onset   Prostate cancer Father     Prostate cancer Brother    Prostate cancer Paternal Grandfather    Breast cancer Neg Hx    Colon cancer Neg Hx    Pancreatic cancer Neg Hx     Social History   Tobacco Use   Smoking status: Never   Smokeless tobacco: Never  Vaping Use   Vaping Use: Never used  Substance Use Topics   Alcohol use: Yes    Alcohol/week: 2.0 standard drinks    Types: 2 Glasses of wine per week   Drug use: Never    Home Medications Prior to Admission medications   Medication Sig Start Date End Date Taking? Authorizing Provider  amLODipine (NORVASC) 5 MG tablet TAKE 1 TABLET BY MOUTH EVERY DAY 07/18/21   Marin Olp, MD  clotrimazole-betamethasone (LOTRISONE) cream Apply topically 2 (two) times daily. 07/18/21   [provider]  COVID-19 mRNA bivalent vaccine, Pfizer, (PFIZER COVID-19 VAC BIVALENT) injection Inject into the muscle. 06/28/21   Carlyle Basques, MD  enzalutamide Gillermina Phy) 40 MG tablet Take 80 mg by mouth daily.    [provider]  lidocaine-prilocaine (EMLA) cream Apply 1 application topically as directed. Apply 1/2 tablespoon to port site 2 hours prior to stick and cover with Press and Seal to numb site 06/08/21   Ladell Pier, MD  Melatonin 3 MG CAPS Take by mouth.    [provider]  ondansetron (ZOFRAN) 8 MG tablet Take 1 tablet (8 mg total) by mouth every 8 (eight) hours as needed for nausea or vomiting. Do not start until 72 hours after IV chemo treatment Patient not taking: No sig reported 06/08/21   Ladell Pier, MD  prochlorperazine (COMPAZINE) 10 MG tablet Take 1 tablet (10 mg total) by mouth every 6 (six) hours as needed. Patient not taking: No sig reported 06/08/21   Ladell Pier, MD    Allergies    Patient has no known allergies.  Review of Systems   Review of Systems  Constitutional:  Negative for fever.  HENT:  Negative for ear pain and sore throat.   Eyes:  Negative for pain.  Respiratory:  Negative for cough.    Cardiovascular:  Negative for chest pain.  Gastrointestinal:  Negative for abdominal pain.  Genitourinary:  Negative for flank pain.  Musculoskeletal:  Negative for back pain.  Skin:  Negative for color change and rash.  Neurological:  Negative for syncope.  All other systems reviewed and are negative.  Physical Exam Updated Vital Signs BP (!) 157/81   Pulse 86   Temp 97.9 F (36.6 C)   Resp 15   Ht 5\' 9"  (1.753 m)   Wt 74.4 kg   SpO2 100%   BMI 24.22 kg/m   Physical Exam Constitutional:      Appearance: He is well-developed.  HENT:     Head: Normocephalic.     Nose: Nose normal.  Eyes:     Extraocular Movements: Extraocular movements intact.  Cardiovascular:     Rate and Rhythm: Normal rate.  Pulmonary:     Effort: Pulmonary effort is normal.  Skin:    Coloration: Skin is not jaundiced.  Neurological:     General: No focal deficit present.     Mental Status: He is alert and oriented to person, place, and time. Mental status is at baseline.     Cranial Nerves: No cranial nerve deficit.     Motor: No weakness.     Gait: Gait normal.     Comments: 5/5 strength all extremities.  Finger-nose intact.  Gait is normal without assistance.    ED Results / Procedures / Treatments   Labs (all labs ordered are listed, but only abnormal results are displayed) Labs Reviewed  RESP PANEL BY RT-PCR (FLU A&B, COVID) [id number]  CBC WITH DIFFERENTIAL/PLATELET  COMPREHENSIVE METABOLIC PANEL    EKG None  Radiology CT Head Wo Contrast  Result Date: 08/15/2021 CLINICAL DATA:  Transient ischemic attack (TIA) EXAM: CT HEAD WITHOUT CONTRAST TECHNIQUE: Contiguous axial images were obtained from the base of the skull through the vertex without intravenous contrast. COMPARISON:  None. FINDINGS: Brain: No evidence of large-territorial acute infarction. No parenchymal hemorrhage. No mass lesion. No extra-axial collection. No mass effect or midline shift. No hydrocephalus. Basilar cisterns  are patent. Vascular: No hyperdense vessel. Skull: No acute fracture or focal lesion. Sinuses/Orbits: Paranasal sinuses and mastoid air cells are clear. The orbits are unremarkable. Other: None. IMPRESSION: No acute intracranial abnormality. Electronically Signed   By: Iven Finn M.D.   On: 08/15/2021 17:30   CT CHEST ABDOMEN PELVIS W CONTRAST  Result Date: 08/15/2021 CLINICAL DATA:  Colorectal cancer surveillance. EXAM: CT CHEST, ABDOMEN, AND PELVIS WITH CONTRAST TECHNIQUE: Multidetector CT imaging of the chest, abdomen and pelvis was performed following the standard protocol during bolus administration of intravenous contrast. CONTRAST:  135mL OMNIPAQUE IOHEXOL 300 MG/ML  SOLN COMPARISON:  05/30/2021  FINDINGS: CT CHEST FINDINGS Cardiovascular: Right Port-A-Cath tip at mid right atrium. Aortic atherosclerosis. Tortuous thoracic aorta. Normal heart size, without pericardial effusion. Lad and right coronary artery calcification. No central pulmonary embolism, on this non-dedicated study. Mediastinum/Nodes: No supraclavicular adenopathy. No mediastinal or definite hilar adenopathy, given limitations of unenhanced CT. Tiny hiatal hernia. Lungs/Pleura: No pleural fluid. Significant decrease in size of pulmonary nodules. Index right middle lobe 3 mm nodule on 106/4 is at the site of an 8 mm nodule on the prior exam. Just caudal to this, a 3-4 mm focus of thickening/nodularity along the right minor fissure is at the site of a 7 mm nodule on the prior exam. Example 111/4 today. Posterior left upper lobe 2 mm nodule on 71/4 is at the site of a 4 mm nodule on the prior exam. Just posterior and cephalad to this, a 2 mm nodule on 66/4 measured 4-5 mm previously. No new or enlarging nodules identified. Musculoskeletal: No acute osseous abnormality. CT ABDOMEN PELVIS FINDINGS Hepatobiliary: Multiple hepatic cysts are again identified. Index hepatic dome (segments 4A and 8) mass measures 3.8 x 3.7 cm on 51/12 versus 5.7  x 6.5 cm on the prior. Just caudal to this, a segment 8 2.2 cm lesion on 50/2 measured 3.6 cm on the prior exam (when remeasured). No new liver lesions. Normal gallbladder, without biliary ductal dilatation. Pancreas: Normal, without mass or ductal dilatation. Spleen: Splenomegaly at 15.2 cm craniocaudal versus 12.7 cm on the prior exam (when remeasured). Adrenals/Urinary Tract: Normal right adrenal gland. Left adrenal nodularity at up to 1.5 cm is unchanged. Mild renal cortical thinning bilaterally. Heterogeneous posterior interpolar right renal 1.4 cm mass is similar in size to on the prior exam No hydronephrosis. Bladder wall thickening is unchanged. Stomach/Bowel: Normal remainder of the stomach. Again identified is moderate anorectal wall thickening. This extends more proximally today, to the level of the sigmoid. Example 108/2. Surrounding interstitial thickening. Normal terminal ileum and appendix.  Normal small bowel. Vascular/Lymphatic: Aortic atherosclerosis. No abdominopelvic adenopathy. Reproductive: Radiation seeds in the prostate. Probable TURP defect. Other: No significant free fluid. No free intraperitoneal air. Trace perihepatic ascites on 71/2 is new. Small fat containing left inguinal hernia. Musculoskeletal: No acute osseous abnormality. 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 IMPRESSION: CT CHEST IMPRESSION 1. 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 2. No thoracic adenopathy. 3. Coronary artery atherosclerosis. Aortic Atherosclerosis (ICD10-I70.0). CT ABDOMEN AND PELVIS IMPRESSION 1. Response to therapy of hepatic metastasis. 2. Persistent anorectal wall thickening, with extension more proximally to the level of the sigmoid. This could be related to prior radiation therapy or represent active proctitis. 3. Developing splenomegaly. 4. Similar right renal mass, suspicious for renal cell carcinoma. 5. Similar left adrenal nodularity, favoring adenomas. 6. New trace perihepatic ascites.  Electronically Signed   By: Abigail Miyamoto M.D.   On: 08/15/2021 17:47    Procedures Procedures   Medications Ordered in ED Medications  aspirin tablet 325 mg (325 mg Oral Given 08/15/21 1759)  iohexol (OMNIPAQUE) 300 MG/ML solution 100 mL (100 mLs Intravenous Contrast Given 08/15/21 1703)    ED Course  I have reviewed the triage vital signs and the nursing notes.  Pertinent labs & imaging results that were available during my care of the patient were reviewed by me and considered in my medical decision making (see chart for details).    MDM Rules/Calculators/A&P  CT scan of the head is unremarkable.  Patient states that he missed his regular scheduled cancer surveillance scan of the chest abdomen pelvis which was performed here in the ER with multiple findings consistent with a history of cancer.  Basic labs were also sent..  Labs are unremarkable viral panel negative.  Patient given aspirin here in the ER.  Will be admitted to the hospitalist team for TIA.   Final Clinical Impression(s) / ED Diagnoses Final diagnoses:  Cancer ([place])  TIA (transient ischemic attack)    Rx / DC Orders ED Discharge Orders     None        Luna Fuse, MD 08/15/21 1821

## 2021-08-15 NOTE — ED Triage Notes (Addendum)
Dizziness x 4 , comes and goes when he stands he states it has gotten more frequent , states told the cancer center and was advised to drink more fluids had an episode of slurred speech this afternoon but it is better

## 2021-08-15 NOTE — Telephone Encounter (Signed)
Was called by radiology department, Neil Perry that patient is there for CT scan and reports difficulty walking and and speaking. Instructed her to have him taken to ER now. Wife asking if he should still have his scan--suggested let's find out why he can't walk or talk right now and then re-group on his routine scan.

## 2021-08-15 NOTE — ED Notes (Signed)
Patient transported to CT 

## 2021-08-15 NOTE — H&P (Signed)
History and Physical    Neil Perry KGY:[id number] DOB: December 18, 1940 DOA: 08/15/2021  PCP: Vivi Barrack, MD  Patient coming from: [place] [facility]  I have personally briefly reviewed patient's old medical records in [place]  Chief Complaint: Gait instability, transient slurred speech  HPI: Neil Perry is a 80 y.o. 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  Patient reports several episodes of gait instability since beginning his chemotherapy.  He says he will experience discoordination in both of his legs after standing up from a sitting position.  This has caused him difficulty with ambulating and has had fear of falling.  Symptoms would resolve after he sits back down for a while.  He says first 2 episodes happened at initiation of chemotherapy however has had 1 episode per day the last 3 days.  He has not fallen and denies any significant numbness/tingling. He has not had any dizziness or lightheadedness associated with these episodes.  This morning (11/23) his wife noticed that his speech was slurred when he was speaking to her.  This lasted less than 1 minute.  He did not have any difficulty understanding his wife at the time.  He has not had any focal weakness or noticeable facial droop.  He denies any chest pain, palpitations, dyspnea.  He has not had any recent nausea or vomiting.  He does report dysuria.  He reports frequent bowel movements without diarrhea.  [place]  Initial vitals showed BP 152/96, pulse 99, RR 20, temp 97.9 F, SPO2 100% on room air.  Labs show WBC 15.8, hemoglobin 10.7,  platelets 108,000, sodium 136, potassium 3.5, bicarb 24, BUN 15, creatinine 1.07, serum glucose 99, AST 27, ALT 25, alk phos 135, total bilirubin 0.7.  COVID and influenza negative.  Urinalysis ordered and pending collection.  CT head without contrast is negative for acute intracranial abnormality.  CT chest with contrast showed response to therapy of pulmonary metastasis with no thoracic adenopathy.    CT abdomen/pelvis with contrast showed response to therapy of hepatic metastasis with persistent anorectal wall thickening with extension proximally to the level of the sigmoid, developing splenomegaly, similar right renal mass suspicious for renal cell carcinoma, similar left adrenal nodularity favoring adenoma, new trace perihepatic ascites.  Patient was given aspirin 325 mg.  The hospitalist service was consulted to admit for evaluation of transient slurred speech.  Review of Systems: All systems reviewed and are negative except as documented in history of present illness above.   Past Medical History:  Diagnosis Date   Cataract    Hypertension    Prostate cancer ([facility]     Past Surgical History:  Procedure Laterality Date   cataract surgery Right    IR IMAGING GUIDED PORT INSERTION  06/04/2021   LIVER BIOPSY     August 2022   PROSTATE BIOPSY     PROSTATECTOMY  2018    Social History:  reports that he has never smoked. He has never used smokeless tobacco. He reports current alcohol use of about 2.0 standard drinks per week. He reports that he does not use drugs.  No Known Allergies  Family History  Problem Relation Age of Onset   Prostate cancer  Father    Prostate cancer Brother    Prostate cancer Paternal Grandfather    Breast cancer Neg Hx    Colon cancer Neg Hx    Pancreatic cancer Neg Hx      Prior to Admission medications   Medication Sig Start Date End Date Taking? Authorizing Provider  amLODipine (NORVASC) 5 MG tablet TAKE 1 TABLET BY MOUTH EVERY DAY 07/18/21    Marin Olp, MD  clotrimazole-betamethasone (LOTRISONE) cream Apply topically 2 (two) times daily. 07/18/21   [provider]  COVID-19 mRNA bivalent vaccine, Pfizer, (PFIZER COVID-19 VAC BIVALENT) injection Inject into the muscle. 06/28/21   Carlyle Basques, MD  enzalutamide Gillermina Phy) 40 MG tablet Take 80 mg by mouth daily.    [provider]  lidocaine-prilocaine (EMLA) cream Apply 1 application topically as directed. Apply 1/2 tablespoon to port site 2 hours prior to stick and cover with Press and Seal to numb site 06/08/21   Ladell Pier, MD  Melatonin 3 MG CAPS Take by mouth.    [provider]  ondansetron (ZOFRAN) 8 MG tablet Take 1 tablet (8 mg total) by mouth every 8 (eight) hours as needed for nausea or vomiting. Do not start until 72 hours after IV chemo treatment Patient not taking: No sig reported 06/08/21   Ladell Pier, MD  prochlorperazine (COMPAZINE) 10 MG tablet Take 1 tablet (10 mg total) by mouth every 6 (six) hours as needed. Patient not taking: No sig reported 06/08/21   Ladell Pier, MD    Physical Exam: Vitals:   08/15/21 2000 08/15/21 2030 08/15/21 2150 08/15/21 2306  BP: (!) 157/94 (!) 155/88 (!) 161/79 (!) 156/72  Pulse: 82 86 84 84  Resp: 14 14 18 18   Temp:   (!) 97.5 F (36.4 C) 98 F (36.7 C)  TempSrc:   Oral Oral  SpO2: 100% 98% 95% 96%  Weight:      Height:       Constitutional: Resting supine in bed, NAD, calm, comfortable Eyes: PERRL, lids and conjunctivae normal ENMT: Mucous membranes are moist. Posterior pharynx clear of any exudate or lesions.Normal dentition.  Neck: normal, supple, no masses. Respiratory: clear to auscultation bilaterally, no wheezing, no crackles. Normal respiratory effort. No accessory muscle use.  Cardiovascular: Regular rate and rhythm, no murmurs / rubs / gallops. No extremity edema. 2+ pedal pulses. Abdomen: no tenderness, no masses palpated. No hepatosplenomegaly. Bowel sounds  positive.  Musculoskeletal: no clubbing / cyanosis. No joint deformity upper and lower extremities. Good ROM, no contractures. Normal muscle tone.  Skin: no rashes, lesions, ulcers. No induration Neurologic: CN 2-12 grossly intact. Sensation intact. Strength 5/5 in all 4.  FTN and HTS intact.  Speech is fluent. Psychiatric: Normal judgment and insight. Alert and oriented x 3. Normal mood.    Labs on Admission: I have personally reviewed following labs and imaging studies  CBC: Recent Labs  Lab 08/15/21 1746  WBC 15.8*  NEUTROABS 13.3*  HGB 10.7*  HCT 31.3*  MCV 85.3  PLT 697*   Basic Metabolic Panel: Recent Labs  Lab 08/15/21 1746  NA 136  K 3.5  CL 102  CO2 24  GLUCOSE 99  BUN 15  CREATININE 1.07  CALCIUM 9.6   GFR: Estimated Creatinine Clearance: 54.1 mL/min (by C-G formula based on SCr of 1.07 mg/dL). Liver Function Tests: Recent Labs  Lab 08/15/21 1746  AST 27  ALT 25  ALKPHOS 135*  BILITOT 0.7  PROT 6.0*  ALBUMIN 3.8  No results for input(s): LIPASE, AMYLASE in the last 168 hours. No results for input(s): AMMONIA in the last 168 hours. Coagulation Profile: No results for input(s): INR, PROTIME in the last 168 hours. Cardiac Enzymes: No results for input(s): CKTOTAL, CKMB, CKMBINDEX, TROPONINI in the last 168 hours. BNP (last 3 results) No results for input(s): PROBNP in the last 8760 hours. HbA1C: No results for input(s): HGBA1C in the last 72 hours. CBG: No results for input(s): GLUCAP in the last 168 hours. Lipid Profile: No results for input(s): CHOL, HDL, LDLCALC, TRIG, CHOLHDL, LDLDIRECT in the last 72 hours. Thyroid Function Tests: No results for input(s): TSH, T4TOTAL, FREET4, T3FREE, THYROIDAB in the last 72 hours. Anemia Panel: No results for input(s): VITAMINB12, FOLATE, FERRITIN, TIBC, IRON, RETICCTPCT in the last 72 hours. Urine analysis: No results found for: COLORURINE, APPEARANCEUR, LABSPEC, PHURINE, GLUCOSEU, HGBUR, BILIRUBINUR,  KETONESUR, PROTEINUR, UROBILINOGEN, NITRITE, LEUKOCYTESUR  Radiological Exams on Admission: CT Head Wo Contrast  Result Date: 08/15/2021 CLINICAL DATA:  Transient ischemic attack (TIA) EXAM: CT HEAD WITHOUT CONTRAST TECHNIQUE: Contiguous axial images were obtained from the base of the skull through the vertex without intravenous contrast. COMPARISON:  None. FINDINGS: Brain: No evidence of large-territorial acute infarction. No parenchymal hemorrhage. No mass lesion. No extra-axial collection. No mass effect or midline shift. No hydrocephalus. Basilar cisterns are patent. Vascular: No hyperdense vessel. Skull: No acute fracture or focal lesion. Sinuses/Orbits: Paranasal sinuses and mastoid air cells are clear. The orbits are unremarkable. Other: None. IMPRESSION: No acute intracranial abnormality. Electronically Signed   By: Iven Finn M.D.   On: 08/15/2021 17:30   CT CHEST ABDOMEN PELVIS W CONTRAST  Result Date: 08/15/2021 CLINICAL DATA:  Colorectal cancer surveillance. EXAM: CT CHEST, ABDOMEN, AND PELVIS WITH CONTRAST TECHNIQUE: Multidetector CT imaging of the chest, abdomen and pelvis was performed following the standard protocol during bolus administration of intravenous contrast. CONTRAST:  141m OMNIPAQUE IOHEXOL 300 MG/ML  SOLN COMPARISON:  05/30/2021 FINDINGS: CT CHEST FINDINGS Cardiovascular: Right Port-A-Cath tip at mid right atrium. Aortic atherosclerosis. Tortuous thoracic aorta. Normal heart size, without pericardial effusion. Lad and right coronary artery calcification. No central pulmonary embolism, on this non-dedicated study. Mediastinum/Nodes: No supraclavicular adenopathy. No mediastinal or definite hilar adenopathy, given limitations of unenhanced CT. Tiny hiatal hernia. Lungs/Pleura: No pleural fluid. Significant decrease in size of pulmonary nodules. Index right middle lobe 3 mm nodule on 106/4 is at the site of an 8 mm nodule on the prior exam. Just caudal to this, a 3-4 mm  focus of thickening/nodularity along the right minor fissure is at the site of a 7 mm nodule on the prior exam. Example 111/4 today. Posterior left upper lobe 2 mm nodule on 71/4 is at the site of a 4 mm nodule on the prior exam. Just posterior and cephalad to this, a 2 mm nodule on 66/4 measured 4-5 mm previously. No new or enlarging nodules identified. Musculoskeletal: No acute osseous abnormality. CT ABDOMEN PELVIS FINDINGS Hepatobiliary: Multiple hepatic cysts are again identified. Index hepatic dome (segments 4A and 8) mass measures 3.8 x 3.7 cm on 51/12 versus 5.7 x 6.5 cm on the prior. Just caudal to this, a segment 8 2.2 cm lesion on 50/2 measured 3.6 cm on the prior exam (when remeasured). No new liver lesions. Normal gallbladder, without biliary ductal dilatation. Pancreas: Normal, without mass or ductal dilatation. Spleen: Splenomegaly at 15.2 cm craniocaudal versus 12.7 cm on the prior exam (when remeasured). Adrenals/Urinary Tract: Normal right adrenal gland. Left adrenal nodularity  at up to 1.5 cm is unchanged. Mild renal cortical thinning bilaterally. Heterogeneous posterior interpolar right renal 1.4 cm mass is similar in size to on the prior exam No hydronephrosis. Bladder wall thickening is unchanged. Stomach/Bowel: Normal remainder of the stomach. Again identified is moderate anorectal wall thickening. This extends more proximally today, to the level of the sigmoid. Example 108/2. Surrounding interstitial thickening. Normal terminal ileum and appendix.  Normal small bowel. Vascular/Lymphatic: Aortic atherosclerosis. No abdominopelvic adenopathy. Reproductive: Radiation seeds in the prostate. Probable TURP defect. Other: No significant free fluid. No free intraperitoneal air. Trace perihepatic ascites on 71/2 is new. Small fat containing left inguinal hernia. Musculoskeletal: No acute osseous abnormality. 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 IMPRESSION: CT CHEST IMPRESSION 1. Response to  therapy of pulmonary metastasis. 2. No thoracic adenopathy. 3. Coronary artery atherosclerosis. Aortic Atherosclerosis (ICD10-I70.0). CT ABDOMEN AND PELVIS IMPRESSION 1. Response to therapy of hepatic metastasis. 2. Persistent anorectal wall thickening, with extension more proximally to the level of the sigmoid. This could be related to prior radiation therapy or represent active proctitis. 3. Developing splenomegaly. 4. Similar right renal mass, suspicious for renal cell carcinoma. 5. Similar left adrenal nodularity, favoring adenomas. 6. New trace perihepatic ascites. Electronically Signed   By: Abigail Miyamoto M.D.   On: 08/15/2021 17:47    EKG: Ordered and pending.  Assessment/Plan Principal Problem:   Slurred speech Active Problems:   Essential hypertension   Rectal cancer (HCC)   Abdullah Rizzi is a 80 y.o. 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  Gait instability and transient slurred speech: Patient reports 5 episodes of gait instability/discoordination of lower extremities since starting chemotherapy.  Morning of admission he had transient slurred speech lasting less than 1 minute.  Question of chemotherapy associated neuropathy and/or TIA. -CT head negative -Obtain MRI brain -Check orthostatic vitals -PT/OT eval -Check hemoglobin A1c and lipid panel -Monitor on telemetry, continue neurochecks -Continue aspirin 81 mg daily -Allow permissive hypertension for now  Hypertension: Holding home amlodipine to allow permissive hypertension for now.  Leukocytosis: Likely secondary to pegfilgrastim, last given 11/17 for chemotherapy related leukopenia.  Dysuria: Check  urinalysis.  Thrombocytopenia: Mild without obvious bleeding.  Continue to monitor.  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 SAmmie Dalton  On active chemotherapy.  DVT prophylaxis: Lovenox Code Status: Full code, confirmed with patient Family Communication: Discussed with patient, he has discussed with family Disposition Plan: From home, dispo pending clinical progress Consults called: None Level of care: Telemetry Medical Admission status:  Status is: Observation  The patient remains OBS appropriate and will d/c before 2 midnights.   VZada FindersMD [facility]  If 7PM-7AM, please contact night-coverage [email or web address]  08/15/2021, 11:14 PM

## 2021-08-15 NOTE — ED Notes (Signed)
Called [facility] to transport patient to [facility] 3W room 6

## 2021-08-16 ENCOUNTER — Observation Stay (HOSPITAL_COMMUNITY): Payer: Medicare Other

## 2021-08-16 ENCOUNTER — Observation Stay (HOSPITAL_BASED_OUTPATIENT_CLINIC_OR_DEPARTMENT_OTHER): Payer: Medicare Other

## 2021-08-16 DIAGNOSIS — G459 Transient cerebral ischemic attack, unspecified: Secondary | ICD-10-CM

## 2021-08-16 LAB — URINALYSIS, ROUTINE W REFLEX MICROSCOPIC
Bilirubin Urine: NEGATIVE
Glucose, UA: NEGATIVE mg/dL
Hgb urine dipstick: NEGATIVE
Ketones, ur: NEGATIVE mg/dL
Leukocytes,Ua: NEGATIVE
Nitrite: NEGATIVE
Protein, ur: NEGATIVE mg/dL
Specific Gravity, Urine: 1.02 (ref 1.005–1.030)
pH: 6 (ref 5.0–8.0)

## 2021-08-16 LAB — BASIC METABOLIC PANEL
Anion gap: 6 (ref 5–15)
BUN: 10 mg/dL (ref 8–23)
CO2: 27 mmol/L (ref 22–32)
Calcium: 8.9 mg/dL (ref 8.9–10.3)
Chloride: 108 mmol/L (ref 98–111)
Creatinine, Ser: 1.16 mg/dL (ref 0.61–1.24)
GFR, Estimated: >60 mL/min (ref >60)
Glucose, Bld: 102 mg/dL — ABNORMAL HIGH (ref 70–99)
Potassium: 3.5 mmol/L (ref 3.5–5.1)
Sodium: 141 mmol/L (ref 135–145)

## 2021-08-16 LAB — CBC WITH DIFFERENTIAL/PLATELET
Abs Immature Granulocytes: 0.7 10*3/uL — ABNORMAL HIGH (ref 0.00–0.07)
Band Neutrophils: 26 %
Basophils Absolute: 0 10*3/uL (ref 0.0–0.1)
Basophils Relative: 0 %
Eosinophils Absolute: 0 10*3/uL (ref 0.0–0.5)
Eosinophils Relative: 0 %
HCT: 28.5 % — ABNORMAL LOW (ref 39.0–52.0)
Hemoglobin: 10 g/dL — ABNORMAL LOW (ref 13.0–17.0)
Lymphocytes Relative: 1 %
Lymphs Abs: 0.1 10*3/uL — ABNORMAL LOW (ref 0.7–4.0)
MCH: 30 pg (ref 26.0–34.0)
MCHC: 35.1 g/dL (ref 30.0–36.0)
MCV: 85.6 fL (ref 80.0–100.0)
Metamyelocytes Relative: 5 %
Monocytes Absolute: 0.4 10*3/uL (ref 0.1–1.0)
Monocytes Relative: 3 %
Neutro Abs: 11.9 10*3/uL — ABNORMAL HIGH (ref 1.7–7.7)
Neutrophils Relative %: 65 %
Platelets: 92 10*3/uL — ABNORMAL LOW (ref 150–400)
RBC: 3.33 MIL/uL — ABNORMAL LOW (ref 4.22–5.81)
RDW: 21.5 % — ABNORMAL HIGH (ref 11.5–15.5)
Smear Review: DECREASED
WBC Morphology: INCREASED
WBC: 13.1 10*3/uL — ABNORMAL HIGH (ref 4.0–10.5)
nRBC: 0.6 % — ABNORMAL HIGH (ref 0.0–0.2)

## 2021-08-16 LAB — ECHOCARDIOGRAM COMPLETE
Area-P 1/2: 3.77 cm2
Height: 69 in
S' Lateral: 3 cm
Weight: 2624 oz

## 2021-08-16 LAB — LIPID PANEL
Cholesterol: 118 mg/dL (ref 0–200)
HDL: 25 mg/dL — ABNORMAL LOW (ref >40)
LDL Cholesterol: 51 mg/dL (ref 0–99)
Total CHOL/HDL Ratio: 4.7 RATIO
Triglycerides: 208 mg/dL — ABNORMAL HIGH (ref <150)
VLDL: 42 mg/dL — ABNORMAL HIGH (ref 0–40)

## 2021-08-16 LAB — HEMOGLOBIN A1C
Hgb A1c MFr Bld: 4.4 % — ABNORMAL LOW (ref 4.8–5.6)
Mean Plasma Glucose: 79.58 mg/dL

## 2021-08-16 MED ORDER — ASPIRIN 81 MG PO TBEC: 81.0000 mg | DELAYED_RELEASE_TABLET | Freq: Every day | ORAL | 11 refills | Status: AC

## 2021-08-16 MED ORDER — HEPARIN SOD (PORK) LOCK FLUSH 100 UNIT/ML IV SOLN
500.0000 [IU] | INTRAVENOUS | Status: AC | PRN
  Administered 2021-08-16: 500 [IU]
  Filled 2021-08-16: qty 5

## 2021-08-16 NOTE — Progress Notes (Incomplete)
{  Select Note:[id number]} 

## 2021-08-16 NOTE — TOC Initial Note (Signed)
Transition of Care [facility]) - Initial/Assessment Note    Patient Details  Name: Neil Perry MRN: [id number] Date of Birth: 1941/06/18  [facility] CM/SW Contact:    Verdell Carmine, RN Phone Number: 08/16/2021, 8:51 AM  Clinical Narrative:                 Presented with TIA symptoms PT and OT consults reveal no follow up or needs. Spoke to patient regarding needs and transportation when discharged, DC planning. Patient would like a rolling walker with seat.  And new cane.  His wife can pick him up when DC.   Expected Discharge Plan: Home/Self Care Barriers to Discharge: Continued Medical Work up   Patient Goals and CMS Choice        Expected Discharge Plan and Services Expected Discharge Plan: Home/Self Care     Post Acute Care Choice: NA Living arrangements for the past 2 months: Single Family Home                                      Prior Living Arrangements/Services Living arrangements for the past 2 months: Single Family Home Lives with:: Spouse Patient language and need for interpreter reviewed:: Yes        Need for Family Participation in Patient Care: Yes (Comment) Care giver support system in place?: Yes (comment)   Criminal Activity/Legal Involvement Pertinent to Current Situation/Hospitalization: No - Comment as needed  Activities of Daily Living Home Assistive Devices/Equipment: Cane (specify quad or straight), Blood pressure cuff, Scales ADL Screening (condition at time of admission) Patient's cognitive ability adequate to safely complete daily activities?: Yes Is the patient deaf or have difficulty hearing?: No Does the patient have difficulty seeing, even when wearing glasses/contacts?: Yes Does the patient have difficulty concentrating, remembering, or making decisions?: No Patient able to express need for assistance with ADLs?: Yes Does the patient have difficulty dressing or bathing?: No Independently performs ADLs?: Yes  (appropriate for developmental age) Does the patient have difficulty walking or climbing stairs?: Yes Weakness of Legs: Both Weakness of Arms/Hands: None  Permission Sought/Granted                  Emotional Assessment       Orientation: : Oriented to Self, Oriented to  Time, Oriented to Place, Oriented to Situation Alcohol / Substance Use: Not Applicable Psych Involvement: No (comment)  Admission diagnosis:  TIA (transient ischemic attack) [G45.9] Cancer ([place]) [C80.1] Patient Active Problem List   Diagnosis Date Noted   Slurred speech 08/15/2021   Rectal cancer ([place]) 06/05/2021   CKD (chronic kidney disease) stage 3, GFR 30-59 ml/min (HCC) 03/04/2018   Labile hypertension 03/04/2018   Hyperglycemia 03/04/2018   Screening for deficiency anemia 01/28/2018   Screening for malignant neoplasm of colon 01/28/2018   Screening for malignant neoplasm of prostate 01/28/2018   Goals of care, counseling/discussion 01/28/2018   Mitral valve prolapse 01/28/2018   Mixed hyperlipidemia 01/28/2018   Bilateral cataracts 01/28/2018   Essential hypertension 01/28/2018   Malignant tumor of prostate ([place]) 01/28/2018   PCP:  Vivi Barrack, MD Pharmacy:   [place], [place] - [address] [place] [address] Phone: [phone] Fax: [phone]     Social Determinants of Health (SDOH) Interventions    Readmission Risk Interventions No flowsheet data found.

## 2021-08-16 NOTE — TOC Transition Note (Signed)
Transition of Care [facility]) - CM/SW Discharge Note   Patient Details  Name: Neil Perry MRN: [id number] Date of Birth: [birth year]  [facility] Care [facility]) CM/SW Contact:  Verdell Carmine, RN Phone Number: 08/16/2021, 10:17 AM   Clinical Narrative:    Gilford Rile ordered for patient,Insurance will not pay for cane as well, has cane at home. Consulted PT about walker.     Barriers to Discharge: Continued Medical Work up   Patient Goals and CMS Choice        Discharge Placement             DC home today with RW          Discharge Plan and Services     Post Acute Care Choice: NA          DME Arranged: Walker rolling with seat DME Agency: [employer] Date DME Agency Contacted: 08/16/21 Time DME Agency Contacted: [id number] Representative spoke with at DME Agency: [place] ([place]) Interventions     Readmission Risk Interventions No flowsheet data found.

## 2021-08-16 NOTE — Discharge Summary (Signed)
[facility]  Physician Discharge Summary   Patient ID: Neil Perry MRN: [id number] DOB/AGE: 81-May-1942 81 y.o.  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  Patient reports several episodes of gait instability since beginning his chemotherapy.  He says he will experience discoordination in both of his legs after standing up from a sitting position.  This has caused him difficulty with ambulating and has had fear of falling.  Symptoms would resolve after he sits back down for a while.  He says first 2 episodes happened at initiation of chemotherapy however has had 1 episode per day the last 3 days.  He has not fallen and denies any significant numbness/tingling. He has not had any dizziness or lightheadedness associated with these episodes.   This morning (11/23) his wife noticed that his speech was slurred when he was speaking to her.  This lasted less than 1 minute.  He did not have any difficulty understanding his wife at the time.  He has not had any focal weakness or noticeable  facial droop.  He denies any chest pain, palpitations, dyspnea.  He has not had any recent nausea or vomiting.  He does report dysuria.  He reports frequent bowel movements without diarrhea.   [place]:  Initial vitals showed BP 152/96, pulse 99, RR 20, temp 97.9 F, SPO2 100% on room air.   Labs show WBC 15.8, hemoglobin 10.7, platelets 108,000, sodium 136, potassium 3.5, bicarb 24, BUN 15, creatinine 1.07, serum glucose 99, AST 27, ALT 25, alk phos 135, total bilirubin 0.7.   COVID and influenza negative.  Urinalysis ordered and pending collection.   CT head without contrast is negative for acute intracranial abnormality.   CT chest with contrast showed response to therapy of pulmonary metastasis with no thoracic adenopathy.     CT abdomen/pelvis with contrast showed response to therapy of hepatic metastasis with persistent anorectal wall thickening with extension proximally to the level of the sigmoid, developing splenomegaly, similar right renal mass suspicious for renal cell carcinoma, similar left adrenal nodularity favoring adenoma, new trace perihepatic ascites.   Patient was given aspirin 325 mg.  The hospitalist service was consulted to admit for evaluation of transient slurred speech.    Consultations: None  Procedures: Carotid Doppler Transthoracic echocardiogram   HOSPITAL COURSE:   Gait instability and transient slurred speech/suspected TIA Patient reports 5 episodes of gait instability/discoordination of lower extremities since starting chemotherapy.  Morning of admission he had transient slurred speech lasting less than 1 minute.  Question of chemotherapy associated neuropathy and/or TIA.  CT head was unremarkable.  MRI brain did not show any stroke.  Carotid Doppler did not show any significant stenosis.  Echocardiogram as below.  No concern identified. Patient  back to baseline.  Etiology remains unclear but TIA is a possibility.  Side effects  from chemotherapy is also another possibility.  He does not take any antiplatelet agents at baseline.  We will discharge him on 81 mg of aspirin daily.  His LDL is noted to be 51.  HbA1c 4.4. Was not noted to be orthostatic.  Able to ambulate.   Essential hypertension: Continue home medications   Leukocytosis: Likely secondary to pegfilgrastim, last given 11/17 for chemotherapy related leukopenia.   Dysuria: UA was unremarkable  Thrombocytopenia/Normocytic anemia Stable.  No overt bleeding noted.  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 Ammie Dalton.  On active chemotherapy.  CT scan of the chest abdomen and pelvis showed stable findings compared to before with improvement in pulmonary and hepatic metastatic lesions.  Renal lesion of concern was also noted which has been seen previously as well.  Findings can be reviewed by his medical oncologist at follow-up.   Patient is stable.  Okay for discharge home today.   PERTINENT LABS:  The results of significant diagnostics from this hospitalization (including imaging, microbiology, ancillary and laboratory) are listed below for reference.    Microbiology: Recent Results (from the past 240 hour(s))  Resp Panel by RT-PCR (Flu A&B, Covid) Nasopharyngeal Swab     Status: None   Collection Time: 08/15/21  4:17 PM   Specimen: Nasopharyngeal Swab; Nasopharyngeal(NP) swabs in vial transport medium  Result Value Ref Range Status   SARS Coronavirus 2 by RT PCR NEGATIVE NEGATIVE Final    Comment: (NOTE) SARS-CoV-2 target nucleic acids are NOT DETECTED.  The SARS-CoV-2 RNA is generally detectable in upper respiratory specimens during the acute phase of infection. The lowest concentration of SARS-CoV-2 viral copies this assay can detect is 138 copies/mL. A negative result does not preclude SARS-Cov-2 infection and should not be used as the sole basis for treatment or other patient  management decisions. A negative result may occur with  improper specimen collection/handling, submission of specimen other than nasopharyngeal swab, presence of viral mutation(s) within the areas targeted by this assay, and inadequate number of viral copies(<138 copies/mL). A negative result must be combined with clinical observations, patient history, and epidemiological information. The expected result is Negative.  Fact Sheet for Patients:  EntrepreneurPulse.com.au  Fact Sheet for Healthcare Providers:  IncredibleEmployment.be  This test is no t yet approved or cleared by the [place] FDA and  has been authorized for detection and/or diagnosis of SARS-CoV-2 by FDA under an Emergency Use Authorization (EUA). This EUA will remain  in effect (meaning this test can be used) for the duration of the COVID-19 declaration under Section 564(b)(1) of the Act, 21 U.S.C.section 360bbb-3(b)(1), unless the authorization is terminated  or revoked sooner.       Influenza A by PCR NEGATIVE NEGATIVE Final   Influenza B by PCR NEGATIVE NEGATIVE Final    Comment: (NOTE) The Xpert Xpress SARS-CoV-2/FLU/RSV plus assay is intended as an aid in the diagnosis of influenza from Nasopharyngeal swab specimens and should not be used as a sole basis for treatment. Nasal washings and aspirates are unacceptable for Xpert Xpress SARS-CoV-2/FLU/RSV testing.  Fact Sheet for Patients: EntrepreneurPulse.com.au  Fact Sheet for Healthcare Providers: IncredibleEmployment.be  This test is not yet approved or cleared by the [place] FDA and has been authorized for detection and/or diagnosis of SARS-CoV-2 by FDA under an Emergency Use Authorization (EUA). This EUA will remain in effect (meaning this test can  be used) for the duration of the COVID-19 declaration under Section 564(b)(1) of the Act, 21 U.S.C. section 360bbb-3(b)(1),  unless the authorization is terminated or revoked.  Performed at [facility], [address], [place],  [address]      Labs:  COVID-19 Labs   Lab Results  Component Value Date   [place] 08/15/2021      Basic Metabolic Panel: Recent Labs  Lab 08/15/21 1746 08/16/21 0615  NA 136 141  K 3.5 3.5  CL 102 108  CO2 24 27  GLUCOSE 99 102*  BUN 15 10  CREATININE 1.07 1.16  CALCIUM 9.6 8.9   Liver Function Tests: Recent Labs  Lab 08/15/21 1746  AST 27  ALT 25  ALKPHOS 135*  BILITOT 0.7  PROT 6.0*  ALBUMIN 3.8    CBC: Recent Labs  Lab 08/15/21 1746 08/16/21 0615  WBC 15.8* 13.1*  NEUTROABS 13.3* 11.9*  HGB 10.7* 10.0*  HCT 31.3* 28.5*  MCV 85.3 85.6  PLT 108* 92*    IMAGING STUDIES CT Head Wo Contrast  Result Date: 08/15/2021 CLINICAL DATA:  Transient ischemic attack (TIA) EXAM: CT HEAD WITHOUT CONTRAST TECHNIQUE: Contiguous axial images were obtained from the base of the skull through the vertex without intravenous contrast. COMPARISON:  None. FINDINGS: Brain: No evidence of large-territorial acute infarction. No parenchymal hemorrhage. No mass lesion. No extra-axial collection. No mass effect or midline shift. No hydrocephalus. Basilar cisterns are patent. Vascular: No hyperdense vessel. Skull: No acute fracture or focal lesion. Sinuses/Orbits: Paranasal sinuses and mastoid air cells are clear. The orbits are unremarkable. Other: None. IMPRESSION: No acute intracranial abnormality. Electronically Signed   By: Iven Finn M.D.   On: 08/15/2021 17:30   MR BRAIN WO CONTRAST  Result Date: 08/16/2021 CLINICAL DATA:  TIA, weakness in bilateral legs EXAM: MRI HEAD WITHOUT CONTRAST TECHNIQUE: Multiplanar, multiecho pulse sequences of the brain and surrounding structures were obtained without intravenous contrast. COMPARISON:  CT head 08/15/2021, no prior MRI of the brain. FINDINGS: Brain: No restricted diffusion to  suggest acute or subacute infarction. No acute hemorrhage, mass, mass effect, or midline shift. No hydrocephalus or extra-axial collection. Scattered foci susceptibility in the bilateral cerebral hemispheres, which may represent the sequela of prior microhemorrhages. Cerebral volume is within normal limits for age. Vascular: Normal flow voids. Skull and upper cervical spine: Normal marrow signal Sinuses/Orbits: Negative.  Status post bilateral lens replacements. Other: Trace fluid in the mastoid air cells. IMPRESSION: No acute intracranial process. Electronically Signed   By: Merilyn Baba M.D.   On: 08/16/2021 01:22   CT CHEST ABDOMEN PELVIS W CONTRAST  Result Date: 08/15/2021 CLINICAL DATA:  Colorectal cancer surveillance. EXAM: CT CHEST, ABDOMEN, AND PELVIS WITH CONTRAST TECHNIQUE: Multidetector CT imaging of the chest, abdomen and pelvis was performed following the standard protocol during bolus administration of intravenous contrast. CONTRAST:  149m OMNIPAQUE IOHEXOL 300 MG/ML  SOLN COMPARISON:  05/30/2021 FINDINGS: CT CHEST FINDINGS Cardiovascular: Right Port-A-Cath tip at mid right atrium. Aortic atherosclerosis. Tortuous thoracic aorta. Normal heart size, without pericardial effusion. Lad and right coronary artery calcification. No central pulmonary embolism, on this non-dedicated study. Mediastinum/Nodes: No supraclavicular adenopathy. No mediastinal or definite hilar adenopathy, given limitations of unenhanced CT. Tiny hiatal hernia. Lungs/Pleura: No pleural fluid. Significant decrease in size of pulmonary nodules. Index right middle lobe 3 mm nodule on 106/4 is at the site of an 8 mm nodule on the prior exam. Just caudal to this, a 3-4 mm focus of thickening/nodularity  along the right minor fissure is at the site of a 7 mm nodule on the prior exam. Example 111/4 today. Posterior left upper lobe 2 mm nodule on 71/4 is at the site of a 4 mm nodule on the prior exam. Just posterior and cephalad to  this, a 2 mm nodule on 66/4 measured 4-5 mm previously. No new or enlarging nodules identified. Musculoskeletal: No acute osseous abnormality. CT ABDOMEN PELVIS FINDINGS Hepatobiliary: Multiple hepatic cysts are again identified. Index hepatic dome (segments 4A and 8) mass measures 3.8 x 3.7 cm on 51/12 versus 5.7 x 6.5 cm on the prior. Just caudal to this, a segment 8 2.2 cm lesion on 50/2 measured 3.6 cm on the prior exam (when remeasured). No new liver lesions. Normal gallbladder, without biliary ductal dilatation. Pancreas: Normal, without mass or ductal dilatation. Spleen: Splenomegaly at 15.2 cm craniocaudal versus 12.7 cm on the prior exam (when remeasured). Adrenals/Urinary Tract: Normal right adrenal gland. Left adrenal nodularity at up to 1.5 cm is unchanged. Mild renal cortical thinning bilaterally. Heterogeneous posterior interpolar right renal 1.4 cm mass is similar in size to on the prior exam No hydronephrosis. Bladder wall thickening is unchanged. Stomach/Bowel: Normal remainder of the stomach. Again identified is moderate anorectal wall thickening. This extends more proximally today, to the level of the sigmoid. Example 108/2. Surrounding interstitial thickening. Normal terminal ileum and appendix.  Normal small bowel. Vascular/Lymphatic: Aortic atherosclerosis. No abdominopelvic adenopathy. Reproductive: Radiation seeds in the prostate. Probable TURP defect. Other: No significant free fluid. No free intraperitoneal air. Trace perihepatic ascites on 71/2 is new. Small fat containing left inguinal hernia. Musculoskeletal: No acute osseous abnormality. 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 IMPRESSION: CT CHEST IMPRESSION 1. 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 2. No thoracic adenopathy. 3. Coronary artery atherosclerosis. Aortic Atherosclerosis (ICD10-I70.0). CT ABDOMEN AND PELVIS IMPRESSION 1. Response to therapy of hepatic metastasis. 2. Persistent anorectal wall thickening, with  extension more proximally to the level of the sigmoid. This could be related to prior radiation therapy or represent active proctitis. 3. Developing splenomegaly. 4. Similar right renal mass, suspicious for renal cell carcinoma. 5. Similar left adrenal nodularity, favoring adenomas. 6. New trace perihepatic ascites. Electronically Signed   By: Abigail Miyamoto M.D.   On: 08/15/2021 17:47   ECHOCARDIOGRAM COMPLETE  Result Date: 08/16/2021    ECHOCARDIOGRAM REPORT   Patient Name:   HERNDON GRILL Date of Exam: 08/16/2021 Medical Rec #:  [id number]      Height:       69.0 in Accession #:    [id number]     Weight:       164.0 lb Date of Birth:  1940-10-06      BSA:          1.899 m Patient Age:    3 years       BP:           131/76 mmHg Patient Gender: M              HR:           80 bpm. Exam Location:  Inpatient Procedure: 2D Echo Indications:    TIA  History:        Patient has no prior history of Echocardiogram examinations.                 Cancer. chronic kidney disease.; Risk Factors:Hypertension and                 Dyslipidemia.  Sonographer:    Johny Chess RDCS Referring Phys: [id number]  1. Left ventricular ejection fraction, by estimation, is 65%. The left ventricle has normal function. The left ventricle has no regional wall motion abnormalities. There is mild asymmetric left ventricular hypertrophy of the infero-lateral segment. Left  ventricular diastolic parameters are consistent with Grade I diastolic dysfunction (impaired relaxation).  2. Right ventricular systolic function is normal. The right ventricular size is moderately enlarged. There is normal pulmonary artery systolic pressure. The estimated right ventricular systolic pressure is 96.2 mmHg.  3. Right atrial size was mildly dilated.  4. The mitral valve is grossly normal. Trivial mitral valve regurgitation. No evidence of mitral stenosis.  5. The aortic valve is grossly normal. There is mild calcification of the aortic  valve. Aortic valve regurgitation is not visualized. No aortic stenosis is present.  6. The inferior vena cava is normal in size with greater than 50% respiratory variability, suggesting right atrial pressure of 3 mmHg. Conclusion(s)/Recommendation(s): No intracardiac source of embolism detected on this transthoracic study. Consider a transesophageal echocardiogram to exclude cardiac source of embolism if clinically indicated. FINDINGS  Left Ventricle: Left ventricular ejection fraction, by estimation, is 65%. The left ventricle has normal function. The left ventricle has no regional wall motion abnormalities. The left ventricular internal cavity size was normal in size. There is mild asymmetric left ventricular hypertrophy of the infero-lateral segment. Left ventricular diastolic parameters are consistent with Grade I diastolic dysfunction (impaired relaxation). Right Ventricle: The right ventricular size is moderately enlarged. No increase in right ventricular wall thickness. Right ventricular systolic function is normal. There is normal pulmonary artery systolic pressure. The tricuspid regurgitant velocity is 2.66 m/s, and with an assumed right atrial pressure of 3 mmHg, the estimated right ventricular systolic pressure is 22.9 mmHg. Left Atrium: Left atrial size was normal in size. Right Atrium: Right atrial size was mildly dilated. Pericardium: There is no evidence of pericardial effusion. Mitral Valve: The mitral valve is grossly normal. Trivial mitral valve regurgitation. No evidence of mitral valve stenosis. Tricuspid Valve: The tricuspid valve is normal in structure. Tricuspid valve regurgitation is mild . No evidence of tricuspid stenosis. Aortic Valve: The aortic valve is grossly normal. There is mild calcification of the aortic valve. Aortic valve regurgitation is not visualized. No aortic stenosis is present. Pulmonic Valve: The pulmonic valve was normal in structure. Pulmonic valve regurgitation is not  visualized. No evidence of pulmonic stenosis. Aorta: The aortic root is normal in size and structure. Venous: The inferior vena cava is normal in size with greater than 50% respiratory variability, suggesting right atrial pressure of 3 mmHg. IAS/Shunts: No atrial level shunt detected by color flow Doppler.  LEFT VENTRICLE PLAX 2D LVIDd:         4.50 cm   Diastology LVIDs:         3.00 cm   LV e' medial:    7.40 cm/s LV PW:         1.20 cm   LV E/e' medial:  5.2 LV IVS:        1.00 cm   LV e' lateral:   12.90 cm/s LVOT diam:     2.20 cm   LV E/e' lateral: 3.0 LV SV:         64 LV SV Index:   34 LVOT Area:     3.80 cm  RIGHT VENTRICLE RV S prime:     13.40 cm/s TAPSE (M-mode): 1.9 cm LEFT ATRIUM  Index        RIGHT ATRIUM           Index LA diam:        3.40 cm 1.79 cm/m   RA Area:     18.40 cm LA Vol (A2C):   61.4 ml 32.33 ml/m  RA Volume:   51.20 ml  26.96 ml/m LA Vol (A4C):   42.4 ml 22.33 ml/m LA Biplane Vol: 53.0 ml 27.91 ml/m  AORTIC VALVE LVOT Vmax:   86.30 cm/s LVOT Vmean:  59.200 cm/s LVOT VTI:    0.168 m  AORTA Ao Root diam: 3.30 cm Ao Asc diam:  2.70 cm MITRAL VALVE               TRICUSPID VALVE MV Area (PHT): 3.77 cm    TR Peak grad:   28.3 mmHg MV Decel Time: 201 msec    TR Vmax:        266.00 cm/s MV E velocity: 38.30 cm/s MV A velocity: 50.00 cm/s  SHUNTS MV E/A ratio:  0.77        Systemic VTI:  0.17 m                            Systemic Diam: 2.20 cm Cherlynn Kaiser MD Electronically signed by Cherlynn Kaiser MD Signature Date/Time: 08/16/2021/12:09:48 PM    Final    VAS US CAROTID  Result Date: 08/16/2021 Carotid Arterial Duplex Study Patient Name:  BRADIE LACOCK  Date of Exam:   08/16/2021 Medical Rec #: [id number]       Accession #:    [id number] Date of Birth: 1941-01-03       Patient Gender: M Patient Age:   89 years Exam Location:  [facility] Procedure:      VAS US CAROTID Referring Phys: Bonnielee Haff  --------------------------------------------------------------------------------  Indications:       Speech disturbance and Gait instability. Risk Factors:      Hypertension. Other Factors:     Metastatic rectal cancer. Prostate cancer. Possible new renal                    cell carcinoma. Comparison Study:  No prior study Performing Technologist: Sharion Dove RVS  Examination Guidelines: A complete evaluation includes B-mode imaging, spectral Doppler, color Doppler, and power Doppler as needed of all accessible portions of each vessel. Bilateral testing is considered an integral part of a complete examination. Limited examinations for reoccurring indications may be performed as noted.  Right Carotid Findings: +----------+--------+--------+--------+------------------+------------------+           PSV cm/sEDV cm/sStenosisPlaque DescriptionComments           +----------+--------+--------+--------+------------------+------------------+ CCA Prox  107     15                                intimal thickening +----------+--------+--------+--------+------------------+------------------+ CCA Distal94      15                                intimal thickening +----------+--------+--------+--------+------------------+------------------+ ICA Prox  102     21                                                   +----------+--------+--------+--------+------------------+------------------+  ICA Distal66      23                                                   +----------+--------+--------+--------+------------------+------------------+ ECA       88                                                           +----------+--------+--------+--------+------------------+------------------+ +----------+--------+-------+--------+-------------------+           PSV cm/sEDV cmsDescribeArm Pressure (mmHG) +----------+--------+-------+--------+-------------------+ [id number]                                          +----------+--------+-------+--------+-------------------+ +---------+--------+--+--------+--+ VertebralPSV cm/s49EDV cm/s10 +---------+--------+--+--------+--+  Left Carotid Findings: +----------+--------+--------+--------+------------------+------------------+           PSV cm/sEDV cm/sStenosisPlaque DescriptionComments           +----------+--------+--------+--------+------------------+------------------+ CCA Prox  122     19                                intimal thickening +----------+--------+--------+--------+------------------+------------------+ CCA Distal97      19                                intimal thickening +----------+--------+--------+--------+------------------+------------------+ ICA Prox  66      16              heterogenous                         +----------+--------+--------+--------+------------------+------------------+ ICA Distal88      28                                tortuous           +----------+--------+--------+--------+------------------+------------------+ ECA       107     10                                                   +----------+--------+--------+--------+------------------+------------------+ +----------+--------+--------+--------+-------------------+           PSV cm/sEDV cm/sDescribeArm Pressure (mmHG) +----------+--------+--------+--------+-------------------+ [id number]                                         +----------+--------+--------+--------+-------------------+ +---------+--------+--+--------+--+ VertebralPSV cm/s38EDV cm/s10 +---------+--------+--+--------+--+   Summary: Right Carotid: Velocities in the right ICA are consistent with a 1-39% stenosis. Left Carotid: Velocities in the left ICA are consistent with a 1-39% stenosis. Vertebrals:  Bilateral vertebral arteries demonstrate antegrade flow. Subclavians: Normal flow hemodynamics were seen in bilateral subclavian               arteries. *See table(s) above for measurements  and observations.  Electronically signed by Antony Contras MD on 08/16/2021 at 12:33:32 PM.    Final     DISCHARGE EXAMINATION: Vitals:   08/16/21 0146 08/16/21 0310 08/16/21 0815 08/16/21 1149  BP: (!) 145/65 135/71 131/76 122/71  Pulse: 79 (!) 58 74 82  Resp: 18 18 14 14   Temp: 98.1 F (36.7 C) 98.2 F (36.8 C) 98.1 F (36.7 C) 98.8 F (37.1 C)  TempSrc: Oral  Oral Oral  SpO2: 100% 100% 100% 99%  Weight:      Height:       General appearance: Awake alert.  In no distress Resp: Clear to auscultation bilaterally.  Normal effort Cardio: S1-S2 is normal regular.  No S3-S4.  No rubs murmurs or bruit GI: Abdomen is soft.  Nontender nondistended.  Bowel sounds are present normal.  No masses organomegaly Extremities: No edema.  Full range of motion of lower extremities. Neurologic: Alert and oriented x3.  No focal neurological deficits.    DISPOSITION: Home  Discharge Instructions     Call MD for:  difficulty breathing, headache or visual disturbances   Complete by: As directed    Call MD for:  extreme fatigue   Complete by: As directed    Call MD for:  persistant dizziness or light-headedness   Complete by: As directed    Call MD for:  persistant nausea and vomiting   Complete by: As directed    Call MD for:  severe uncontrolled pain   Complete by: As directed    Call MD for:  temperature >100.4   Complete by: As directed    Diet - low sodium heart healthy   Complete by: As directed    Discharge instructions   Complete by: As directed    Please be sure to follow-up with your primary care provider in 1 week.  They can consider referral to neurology.  Take aspirin daily as discussed.  Your LDL cholesterol was 51.  No indication to initiate a statin medication at this time.  You can discuss this further with your PCP.  Seek attention if your symptoms recur.  You were cared for by a hospitalist during your hospital stay.  If you have any questions about your discharge medications or the care you received while you were in the hospital after you are discharged, you can call the unit and asked to speak with the hospitalist on call if the hospitalist that took care of you is not available. Once you are discharged, your primary care physician will handle any further medical issues. Please note that NO REFILLS for any discharge medications will be authorized once you are discharged, as it is imperative that you return to your primary care physician (or establish a relationship with a primary care physician if you do not have one) for your aftercare needs so that they can reassess your need for medications and monitor your lab values. If you do not have a primary care physician, you can call [phone] for a physician referral.   Increase activity slowly   Complete by: As directed          Allergies as of 08/16/2021   No Known Allergies      Medication List     STOP taking these medications    ondansetron 8 MG tablet Commonly known as: [place] COVID-19 Vac Bivalent injection Generic drug: COVID-19 mRNA bivalent vaccine (Pfizer)   prochlorperazine 10 MG tablet Commonly known as: COMPAZINE  TAKE these medications    amLODipine 5 MG tablet Commonly known as: NORVASC TAKE 1 TABLET BY MOUTH EVERY DAY   aspirin 81 MG EC tablet Take 1 tablet (81 mg total) by mouth daily. Swallow whole.   clotrimazole-betamethasone cream Commonly known as: LOTRISONE Apply topically 2 (two) times daily.   enzalutamide 40 MG tablet Commonly known as: XTANDI Take 80 mg by mouth daily.   lidocaine-prilocaine cream Commonly known as: EMLA Apply 1 application topically as directed. Apply 1/2 tablespoon to port site 2 hours prior to stick and cover with Press and Seal to numb site   Melatonin 3 MG Caps Take 3 mg by mouth at bedtime as needed (sleep).       ASK your doctor about these medications     docusate sodium 100 MG capsule Commonly known as: COLACE Take 100 mg by mouth daily.          Follow-up Information     Vivi Barrack, MD Follow up in 1 week(s).   Specialty: Family Medicine Contact information: [place] [address] [phone]                 TOTAL DISCHARGE TIME: 35-minutes  Bonnielee Haff  [facility] Pager on [email or web address]  08/17/2021, 11:31 AM

## 2021-08-16 NOTE — Evaluation (Signed)
Occupational Therapy Evaluation/Discharge Patient Details Name: Neil Perry MRN: [id number] DOB: 02-13-1941 Today's Date: 08/16/2021   History of Present Illness Vaishnav Demartin is a 80 y.o. male who presented to the ED for evaluation of gait instability and transient slurred speech. Head CT negative for acute abnormalities. 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 Pt does endorse fatigue s/p chemo treatments but still able to manage daily tasks without assist. Pt shares that he has had similar symptoms noted on admission s/p chemotherapy treatments in the past. Pt presents now at baseline for ADLs/mobility, including toileting tasks, hallway mobility and navigation of stair flight Independently. Strength, balance and endurance WFL. No safety concerns. BP stable with activity 130s/70s. No further skilled therapy services needed at acute level or on DC.      Recommendations for follow up therapy are one component of a multi-disciplinary discharge planning process, led by the attending physician.  Recommendations may be updated based on patient status, additional functional criteria and insurance authorization.   Follow Up Recommendations  No OT follow up    Assistance Recommended at Discharge None  Functional Status Assessment  Patient has not had a recent decline in their functional status  Equipment Recommendations  None recommended by OT    Recommendations for Other Services       Precautions / Restrictions Precautions Precautions: None Restrictions Weight Bearing Restrictions: No      Mobility  Bed Mobility Overal bed mobility: Independent                  Transfers Overall transfer level: Independent Equipment used: None                      Balance Overall balance assessment: Independent                                         ADL either performed or assessed with clinical judgement   ADL Overall ADL's : Independent                                       General ADL Comments: able to mobilize to/from bathroom, complete toileting task, grooming tasks at sink, hallway mobility and flight of stair negotiation with no UE or one UE support on rail. No LOB, fatigue or safety concerns     Vision Baseline Vision/History: 0 No visual deficits Ability to See in Adequate Light: 0 Adequate Patient Visual Report: No change from baseline Vision Assessment?: No apparent visual deficits     Perception     Praxis      Pertinent Vitals/Pain Pain Assessment: No/denies pain     Hand Dominance Right   Extremity/Trunk Assessment Upper Extremity Assessment Upper Extremity Assessment: Overall WFL for tasks assessed   Lower Extremity Assessment Lower Extremity Assessment: Defer to PT evaluation;Overall [facility] for tasks assessed   Cervical / Trunk Assessment Cervical / Trunk Assessment: Normal   Communication Communication Communication: No difficulties   Cognition Arousal/Alertness: Awake/alert  Behavior During Therapy: WFL for tasks assessed/performed Overall Cognitive Status: Within Functional Limits for tasks assessed                                       General Comments  BP 139/79 sitting EOB, 132/72 standing EOB    Exercises     Shoulder Instructions      Home Living Family/patient expects to be discharged to:: Private residence Living Arrangements: Spouse/significant other Available Help at Discharge: Family;Available 24 hours/day Type of Home: House Home Access: Stairs to enter [employer] of Steps: 3 Entrance Stairs-Rails: Right Home Layout: Multi-level Alternate Level Stairs-Number of Steps: flight Alternate Level Stairs-Rails: Right Bathroom Shower/Tub: Teacher, early years/pre: Standard     Home Equipment: Shower seat;Other (comment) (walking stick)   Additional Comments: Basement, bedroom/bathroom that pt uses is upstairs      Prior Functioning/Environment Prior Level of Function : Independent/Modified Independent;Driving             Mobility Comments: will occasionally use a walking stick in community or stores but does not typically use ADLs Comments: Independent with all ADLs, IADls. endorses some fatigue s/p chemo but able to complete daily tasks. 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        OT Problem List:        OT Treatment/Interventions:      OT Goals(Current goals can be found in the care plan section) Acute Rehab OT Goals Patient Stated Goal: find out source of symptoms, prevent episodes after chemo OT Goal Formulation: All assessment and education complete, DC therapy  OT Frequency:     Barriers to D/C:            Co-evaluation              AM-PAC OT "6 Clicks" Daily Activity     Outcome Measure Help from another person eating meals?: None Help from another person taking care of personal grooming?: None Help from another person toileting, which includes using toliet, bedpan, or urinal?: None Help from another person bathing (including washing, rinsing, drying)?: None Help from another person to put on and taking off regular upper body clothing?: None Help from another person to put on and taking off regular lower body clothing?: None 6 Click Score: 24   End of Session    Activity Tolerance: Patient tolerated treatment well Patient left: in bed;with call bell/phone within reach;Other (comment) (MD at bedside)  OT Visit Diagnosis: Other abnormalities of gait and mobility  (R26.89)                Time: 8546-2703 OT Time Calculation (min): 24 min Charges:  OT General Charges $OT Visit: 1 Visit OT Evaluation $OT Eval Low Complexity: 1 Low OT Treatments $Self Care/Home Management : 8-22 mins  Malachy Chamber, OTR/L Acute Rehab Services Office: [phone]   Layla Maw 08/16/2021, 7:27 AM

## 2021-08-16 NOTE — Progress Notes (Signed)
PT Cancellation & Discharge Note  Patient Details Name: Neil Perry MRN: [id number] DOB: Aug 01, 1941   Cancelled Treatment:    Reason Eval/Treat Not Completed: PT screened, no needs identified, will sign off. Observed pt ambulating and navigating stairs with OT, no unsteadiness noted during brief observation. OT reporting pt is back to his baseline and independent. No PT needs identified, PT will sign off.   Moishe Spice, PT, DPT Acute Rehabilitation Services  Pager: [phone] Office: [place] 08/16/2021, 7:20 AM

## 2021-08-16 NOTE — Care Management Obs Status (Signed)
[facility] NOTIFICATION   Patient Details  Name: Shady Bradish MRN: [id number] Date of Birth: 02/05/1941   Medicare Observation Status Notification Given:  Yes  Notice given via phone permission given to sign that he received notice attached to DC instructions or print out  Verdell Carmine, RN 08/16/2021, 9:01 AM

## 2021-08-16 NOTE — Discharge Instructions (Signed)
Medicare Outpatient Observation Notice   Patient name:  Neil Perry Patient number:  [id number]                                                                                                                                                                       You're a hospital outpatient receiving observation services. You are not an inpatient because:    TIA   You require hospital care for evaluation and/or treatment.  It is expected you will need hospital care for less than a total of two days.                                                                                                                                                                         Being an outpatient may affect what you pay in a hospital:   When you're a hospital outpatient, your observation stay is covered under Medicare Part B.   For Part B services, you generally pay:   A copayment for each outpatient hospital service you get. Part B copayments may vary by type of service.   20% of the Medicare-approved amount for most doctor services, after the Part B deductible.   Observation services may affect coverage and payment of your care after you leave the hospital:     If you need skilled nursing facility (SNF) care after you leave the hospital, Medicare Part A will only cover SNF care if you've had a 3-day minimum, medically necessary, inpatient hospital stay for a related illness or injury. An inpatient hospital stay begins the day the hospital admits you as an inpatient based on a doctor's order and doesn't include the day you're discharged.   If you have Medicaid, a Medicare Advantage plan or other health plan, Medicaid or the plan may have different rules for SNF coverage after you leave the hospital. Check with Medicaid or your plan.   NOTE: Medicare Part A generally doesn't cover outpatient hospital services, like an  observation stay. However, Part A will generally cover medically necessary  inpatient services if the hospital admits you as an inpatient based on a doctor's order. In most cases, you'll pay a one-time deductible for all of your inpatient hospital services for the first 60 days you're in a hospital.                                                                                                                                                                      If you have any questions about your observation services, ask the hospital staff member giving you this notice or the doctor providing your hospital care. You can also ask to speak with someone from the hospital's utilization or discharge planning department.   You can also call 1-800-MEDICARE (1-548-614-3914).  TTY users should call [phone].   Form CMS 14970-YOVZ   Expiration 09/22/2021 OMB APPROVAL [id number]          Your costs for medications:     Generally, prescription and over-the-counter drugs, including "self-administered drugs," you get in a hospital outpatient setting (like an emergency department) aren't covered by Part B. "Self- administered drugs" are drugs you'd normally take on your own. For safety reasons, many hospitals don't allow you to take medications brought from home. If you have a Medicare prescription drug plan (Part D), your plan may help you pay for these drugs. You'll likely need to pay out-of- pocket for these drugs and submit a claim to your drug plan for a refund. Contact your drug plan for more information.                                                                                                                                                                        If you're enrolled in a Medicare Advantage plan (like an HMO or PPO) or other Medicare health plan (Part C), your costs and coverage may be different. Check with your plan to find out about coverage for outpatient  observation services.    If you're a Qualified Medicare Beneficiary through your state  Medicaid program, you can't be billed for Part A or Part B deductibles, coinsurance, and copayments.                                                                                                                                                                      Additional Information (Optional):                                                                                                                                                                                Please sign below to show you received and understand this notice.                                         Date: 08/16/21 / Time:9:00 AM   CMS does not discriminate in its programs and activities. To request this publication in alternative format, please call: 1-800-MEDICARE or email:[email or web address].   Form CMS 98921-[id number]   Expiration 09/22/2021 OMB APPROVAL [id number]      Patient   Add No image attached Trace Slow Corrupt Edit Data Change Template Print On

## 2021-08-16 NOTE — Progress Notes (Signed)
  Echocardiogram 2D Echocardiogram has been performed.  Johny Chess 08/16/2021, 10:46 AM

## 2021-08-16 NOTE — Progress Notes (Signed)
  Echocardiogram 2D Echocardiogram has been performed.  Neil Perry 08/16/2021, 10:43 AM

## 2021-08-16 NOTE — Progress Notes (Signed)
VASCULAR LAB    Carotid duplex has been performed.  See CV proc for preliminary results.   Jakevion Arney, RVT 08/16/2021, 11:02 AM

## 2021-08-19 ENCOUNTER — Other Ambulatory Visit: Payer: Self-pay | Admitting: Oncology

## 2021-08-20 ENCOUNTER — Other Ambulatory Visit: Payer: Self-pay

## 2021-08-20 DIAGNOSIS — C20 Malignant neoplasm of rectum: Secondary | ICD-10-CM

## 2021-08-21 ENCOUNTER — Inpatient Hospital Stay (HOSPITAL_BASED_OUTPATIENT_CLINIC_OR_DEPARTMENT_OTHER): Payer: Medicare Other | Admitting: Oncology

## 2021-08-21 ENCOUNTER — Other Ambulatory Visit: Payer: Self-pay

## 2021-08-21 ENCOUNTER — Inpatient Hospital Stay: Payer: Medicare Other

## 2021-08-21 ENCOUNTER — Telehealth: Payer: Self-pay

## 2021-08-21 VITALS — BP 138/77 | HR 82 | Temp 97.5°F | Resp 20 | Wt 160.2 lb

## 2021-08-21 DIAGNOSIS — C20 Malignant neoplasm of rectum: Secondary | ICD-10-CM | POA: Diagnosis not present

## 2021-08-21 DIAGNOSIS — Z5111 Encounter for antineoplastic chemotherapy: Secondary | ICD-10-CM | POA: Diagnosis not present

## 2021-08-21 LAB — CMP (CANCER CENTER ONLY)
ALT: 22 U/L (ref 0–44)
AST: 20 U/L (ref 15–41)
Albumin: 4.1 g/dL (ref 3.5–5.0)
Alkaline Phosphatase: 99 U/L (ref 38–126)
Anion gap: 9 (ref 5–15)
BUN: 16 mg/dL (ref 8–23)
CO2: 24 mmol/L (ref 22–32)
Calcium: 10 mg/dL (ref 8.9–10.3)
Chloride: 107 mmol/L (ref 98–111)
Creatinine: 0.97 mg/dL (ref 0.61–1.24)
GFR, Estimated: >60 mL/min (ref >60)
Glucose, Bld: 127 mg/dL — ABNORMAL HIGH (ref 70–99)
Potassium: 4 mmol/L (ref 3.5–5.1)
Sodium: 140 mmol/L (ref 135–145)
Total Bilirubin: 0.7 mg/dL (ref 0.3–1.2)
Total Protein: 6.2 g/dL — ABNORMAL LOW (ref 6.5–8.1)

## 2021-08-21 LAB — CBC WITH DIFFERENTIAL (CANCER CENTER ONLY)
Abs Immature Granulocytes: 0.15 10*3/uL — ABNORMAL HIGH (ref 0.00–0.07)
Basophils Absolute: 0 10*3/uL (ref 0.0–0.1)
Basophils Relative: 0 %
Eosinophils Absolute: 0.1 10*3/uL (ref 0.0–0.5)
Eosinophils Relative: 1 %
HCT: 34.5 % — ABNORMAL LOW (ref 39.0–52.0)
Hemoglobin: 11.7 g/dL — ABNORMAL LOW (ref 13.0–17.0)
Immature Granulocytes: 2 %
Lymphocytes Relative: 10 %
Lymphs Abs: 0.7 10*3/uL (ref 0.7–4.0)
MCH: 30.1 pg (ref 26.0–34.0)
MCHC: 33.9 g/dL (ref 30.0–36.0)
MCV: 88.7 fL (ref 80.0–100.0)
Monocytes Absolute: 0.4 10*3/uL (ref 0.1–1.0)
Monocytes Relative: 5 %
Neutro Abs: 6.1 10*3/uL (ref 1.7–7.7)
Neutrophils Relative %: 82 %
Platelet Count: 112 10*3/uL — ABNORMAL LOW (ref 150–400)
RBC: 3.89 MIL/uL — ABNORMAL LOW (ref 4.22–5.81)
RDW: 22.6 % — ABNORMAL HIGH (ref 11.5–15.5)
WBC Count: 7.4 10*3/uL (ref 4.0–10.5)
nRBC: 0.3 % — ABNORMAL HIGH (ref 0.0–0.2)

## 2021-08-21 MED ORDER — SODIUM CHLORIDE 0.9 % IV SOLN
2000.0000 mg/m2 | INTRAVENOUS | Status: DC
  Administered 2021-08-21: 3800 mg via INTRAVENOUS
  Filled 2021-08-21: qty 76

## 2021-08-21 NOTE — Progress Notes (Signed)
Patient seen by Dr. [name] today  Vitals are within treatment parameters.  Labs reviewed by Dr. [name] and are within treatment parameters.  Per physician team, patient is ready for treatment. Please note that modifications are being made to the treatment plan including pt will only receive adrucil pump today

## 2021-08-21 NOTE — Telephone Encounter (Signed)
Spoke with pt's wife. Informed her that pt's referral to [facility] has been sent and phone number provided. Pt's wife verbalizes understanding.

## 2021-08-21 NOTE — Progress Notes (Signed)
Referral order placed for [facility] at [facility] and order faxed.

## 2021-08-21 NOTE — Progress Notes (Signed)
Neil Perry OFFICE PROGRESS NOTE   Diagnosis: Rectal cancer  INTERVAL HISTORY:   [name] completed another cycle of FOLFOX on 08/07/2021.  He presented to the emergency room 08/15/2021 with bilateral leg weakness.  He also had a 1 minute episode of slurred speech.  He was admitted.  He was discharged 08/16/2021 with suspicion of a TIA.  A CT head and brain MRI were unremarkable.  He denies recurrent leg weakness.  He has intermittent peripheral numbness.    Objective:  Vital signs in last 24 hours:  Blood pressure 138/77, pulse 82, temperature (!) 97.5 F (36.4 C), temperature source Oral, resp. rate 20, weight 160 lb 3.2 oz (72.7 kg), SpO2 100 %.    HEENT: No thrush or ulcers Resp: Lungs clear bilaterally Cardio: Regular rate and rhythm GI: No hepatosplenomegaly Vascular: No leg edema Neuro: Alert and oriented, ambulated to the examination table without difficulty, mild to moderate loss of vibratory sense at the fingertips bilaterally  Portacath/PICC-without erythema  Lab Results:  Lab Results  Component Value Date   WBC 7.4 08/21/2021   HGB 11.7 (L) 08/21/2021   HCT 34.5 (L) 08/21/2021   MCV 88.7 08/21/2021   PLT 112 (L) 08/21/2021   NEUTROABS 6.1 08/21/2021    CMP  Lab Results  Component Value Date   NA 141 08/16/2021   K 3.5 08/16/2021   CL 108 08/16/2021   CO2 27 08/16/2021   GLUCOSE 102 (H) 08/16/2021   BUN 10 08/16/2021   CREATININE 1.16 08/16/2021   CALCIUM 8.9 08/16/2021   PROT 6.0 (L) 08/15/2021   ALBUMIN 3.8 08/15/2021   AST 27 08/15/2021   ALT 25 08/15/2021   ALKPHOS 135 (H) 08/15/2021   BILITOT 0.7 08/15/2021   GFRNONAA >60 08/16/2021    Lab Results  Component Value Date   CEA 28.79 (H) 08/07/2021     Medications: I have reviewed the patient's current medications.   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 2-3 new hypervascular masses in the liver dome, no abdominal lymphadenopathy, stable benign left  adrenal adenoma, stable right lower pole kidney mass Ultrasound-guided biopsy of a liver lesion 05/07/2021-metastatic adenocarcinoma with extensive necrosis, immunohistochemical profile consistent with a colorectal primary; foundation 1-microsatellite stable, tumor mutation burden 5, K-rasG 12V, NRAS wildtype Colonoscopy 05/22/2021-ulcerated partially obstructing mass at 15 cm from anal verge CTs 05/30/2021-numerous small pulmonary nodules concerning for metastases, thickening of the rectum, multiple liver metastases Cycle 1 FOLFOX 06/12/2021 Cycle 2 FOLFOX 06/26/2021 Cycle 3 FOLFOX 07/10/2021, oxaliplatin dose reduced due to progressive decline in the [place] and platelet count Cycle 4 FOLFOX 07/24/2021 Cycle 5 FOLFOX 08/07/2021, 5-FU bolus eliminated and oxaliplatin dose reduced, insurance would not approve Udenyca CTs 08/15/2021-decrease size of lung nodules, decreased hepatic metastases, persistent anorectal wall thickening, stable right renal mass Cycle 6 FOLFOX 08/21/2021, 5-FU bolus and 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 Prostate cancer-simple prostatectomy January 2019, Gleason 3+3, 10 to 12% of specimen Active surveillance, biopsy May 2019 with small focus of Gleason 3+3 disease in 1/6 cores, surveillance continued Elevated PSA 04/20/2020 Biopsy 06/19/2020 8/12 cores positive, Gleason 4+4 PET scan 07/07/2020-negative CT 07/07/2020 right iliac and retrocaval adenopathy, 1.3 cm subcapsular right lower pole renal lesion Androgen deprivation therapy beginning 08/21/2020 Radiation to prostate, seminal vesicles, and pelvic lymph nodes to 10/22 - 12/28/2020 He continues every 57-monthFirmagon and daily Xtandi   3.  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  Mitral valve  prolapse 5.  Family history of prostate cancer 6.  08/15/2021 with leg weakness and an episode of slurred speech-TIA?,  Brain imaging negative      Disposition: Neil Perry appears stable.  Completed 5 cycles of FOLFOX.  The restaging CTs reveal a response to therapy with a decrease in lung and liver metastases.  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  It is possible the leg symptoms he experienced last week were related to oxaliplatin neuropathy.  He reports increased malaise.  We decided to hold oxaliplatin with this cycle of chemotherapy.  He will complete a cycle of infusional 5-FU today.  I reviewed the CT images with Neil Perry and Neil Perry.  He will return for an office visit in 2 weeks.  He requested a physical therapy referral.  Betsy Coder, MD  08/21/2021  10:04 AM

## 2021-08-21 NOTE — Progress Notes (Signed)
Patient presents for treatment. RN assessment completed along with the following:  Labs/vitals reviewed - Yes, and within treatment parameters.   Weight within 10% of previous measurement - Yes Oncology Treatment Attestation completed for current therapy- Yes, on date 03/15/2021 Informed consent completed and reflects current therapy/intent - Yes, on date 03/20/2021             Provider progress note reviewed - Today's provider note is not yet available. I reviewed the most recent oncology provider progress note in chart dated 08/07/2021. Treatment/Antibody/Supportive plan reviewed - Yes, and Patient to receive only 5FU pump today per collab nurse's note. S&H and other orders reviewed - Yes, and there are no additional orders identified. Previous treatment date reviewed - Yes, and the appropriate amount of time has elapsed between treatments. Clinic Hand Off Received from - Yes, Collab nurse Doristine Section, RN's note.  Patient to proceed with treatment.

## 2021-08-22 ENCOUNTER — Ambulatory Visit: Payer: Medicare Other | Admitting: Nutrition

## 2021-08-22 NOTE — Progress Notes (Signed)
Telephone follow up completed with patient and wife.  Patient receiving chemotherapy for Rectal Cancer. Completed 5 cycles of FOLFOX.  The restaging CTs reveal a response to therapy with a decrease in lung and liver metastases. Oxaliplatin held with last treatment.  Weight decreased to 160.2 pounds on November 29 from 164 pounds on November 15. Patient weighed 193 pounds November 2021. This is a 17% weight loss over one year.  Labs noted: Na 140, K 4.0, Glucose 127.  Patient recently admitted to the hospital for suspected TIA. He has a PT referral to improve weakness.  Patient acknowledges weight loss. States he thinks he is eating better. Reports drinking a "lot" of coconut water to "help with electrolytes". Continues Enterade BID most days. Stool volume and consistency has not changed. Denies watery stools, just frequent stools.  Nutrition diagnosis: Unintended wt loss continues.  Intervention: Educated on importance of increasing calories and protein to minimize wt loss.  Reviewed importance of protein frequently throughout the day.  Monitoring, Evaluation, Goals: Patient will increase calories and protein to minimize wt loss.  Next Visit: To be scheduled as needed. Patient and wife have RD contact information.

## 2021-08-23 ENCOUNTER — Other Ambulatory Visit: Payer: Self-pay

## 2021-08-23 ENCOUNTER — Inpatient Hospital Stay: Payer: Medicare Other | Attending: Nurse Practitioner

## 2021-08-23 VITALS — BP 123/77 | HR 91 | Temp 97.6°F | Resp 20

## 2021-08-23 DIAGNOSIS — I341 Nonrheumatic mitral (valve) prolapse: Secondary | ICD-10-CM | POA: Insufficient documentation

## 2021-08-23 DIAGNOSIS — Z5111 Encounter for antineoplastic chemotherapy: Secondary | ICD-10-CM | POA: Diagnosis not present

## 2021-08-23 DIAGNOSIS — C20 Malignant neoplasm of rectum: Secondary | ICD-10-CM | POA: Insufficient documentation

## 2021-08-23 DIAGNOSIS — K5900 Constipation, unspecified: Secondary | ICD-10-CM | POA: Diagnosis not present

## 2021-08-23 DIAGNOSIS — C787 Secondary malignant neoplasm of liver and intrahepatic bile duct: Secondary | ICD-10-CM | POA: Insufficient documentation

## 2021-08-23 MED ORDER — HEPARIN SOD (PORK) LOCK FLUSH 100 UNIT/ML IV SOLN
500.0000 [IU] | Freq: Once | INTRAVENOUS | Status: AC | PRN
  Administered 2021-08-23: 500 [IU]

## 2021-08-23 MED ORDER — SODIUM CHLORIDE 0.9% FLUSH
10.0000 mL | INTRAVENOUS | Status: DC | PRN
  Administered 2021-08-23: 10 mL

## 2021-09-01 ENCOUNTER — Other Ambulatory Visit: Payer: Self-pay | Admitting: Oncology

## 2021-09-04 ENCOUNTER — Inpatient Hospital Stay: Payer: Medicare Other

## 2021-09-04 ENCOUNTER — Other Ambulatory Visit: Payer: Self-pay

## 2021-09-04 ENCOUNTER — Telehealth: Payer: Self-pay

## 2021-09-04 ENCOUNTER — Inpatient Hospital Stay (HOSPITAL_BASED_OUTPATIENT_CLINIC_OR_DEPARTMENT_OTHER): Payer: Medicare Other | Admitting: Oncology

## 2021-09-04 VITALS — BP 149/68 | HR 60 | Temp 98.0°F | Resp 20

## 2021-09-04 VITALS — BP 155/66 | HR 61 | Temp 98.1°F | Resp 20 | Ht 69.0 in | Wt 165.4 lb

## 2021-09-04 DIAGNOSIS — C20 Malignant neoplasm of rectum: Secondary | ICD-10-CM

## 2021-09-04 DIAGNOSIS — Z5111 Encounter for antineoplastic chemotherapy: Secondary | ICD-10-CM | POA: Diagnosis not present

## 2021-09-04 LAB — CMP (CANCER CENTER ONLY)
ALT: 13 U/L (ref 0–44)
AST: 15 U/L (ref 15–41)
Albumin: 3.7 g/dL (ref 3.5–5.0)
Alkaline Phosphatase: 58 U/L (ref 38–126)
Anion gap: 8 (ref 5–15)
BUN: 19 mg/dL (ref 8–23)
CO2: 24 mmol/L (ref 22–32)
Calcium: 9.3 mg/dL (ref 8.9–10.3)
Chloride: 108 mmol/L (ref 98–111)
Creatinine: 0.85 mg/dL (ref 0.61–1.24)
GFR, Estimated: >60 mL/min (ref >60)
Glucose, Bld: 108 mg/dL — ABNORMAL HIGH (ref 70–99)
Potassium: 3.9 mmol/L (ref 3.5–5.1)
Sodium: 140 mmol/L (ref 135–145)
Total Bilirubin: 0.5 mg/dL (ref 0.3–1.2)
Total Protein: 5.9 g/dL — ABNORMAL LOW (ref 6.5–8.1)

## 2021-09-04 LAB — CBC WITH DIFFERENTIAL (CANCER CENTER ONLY)
Abs Immature Granulocytes: 0.03 10*3/uL (ref 0.00–0.07)
Basophils Absolute: 0 10*3/uL (ref 0.0–0.1)
Basophils Relative: 1 %
Eosinophils Absolute: 0.1 10*3/uL (ref 0.0–0.5)
Eosinophils Relative: 4 %
HCT: 34.9 % — ABNORMAL LOW (ref 39.0–52.0)
Hemoglobin: 11.7 g/dL — ABNORMAL LOW (ref 13.0–17.0)
Immature Granulocytes: 1 %
Lymphocytes Relative: 13 %
Lymphs Abs: 0.5 10*3/uL — ABNORMAL LOW (ref 0.7–4.0)
MCH: 30.5 pg (ref 26.0–34.0)
MCHC: 33.5 g/dL (ref 30.0–36.0)
MCV: 91.1 fL (ref 80.0–100.0)
Monocytes Absolute: 0.4 10*3/uL (ref 0.1–1.0)
Monocytes Relative: 9 %
Neutro Abs: 2.7 10*3/uL (ref 1.7–7.7)
Neutrophils Relative %: 72 %
Platelet Count: 172 10*3/uL (ref 150–400)
RBC: 3.83 MIL/uL — ABNORMAL LOW (ref 4.22–5.81)
RDW: 20.3 % — ABNORMAL HIGH (ref 11.5–15.5)
WBC Count: 3.8 10*3/uL — ABNORMAL LOW (ref 4.0–10.5)
nRBC: 0 % (ref 0.0–0.2)

## 2021-09-04 LAB — CEA (ACCESS): CEA (CHCC): 6.88 ng/mL — ABNORMAL HIGH (ref 0.00–5.00)

## 2021-09-04 MED ORDER — SODIUM CHLORIDE 0.9 % IV SOLN
2000.0000 mg/m2 | INTRAVENOUS | Status: DC
  Administered 2021-09-04: 3800 mg via INTRAVENOUS
  Filled 2021-09-04: qty 76

## 2021-09-04 MED ORDER — FLUOROURACIL CHEMO INJECTION 2.5 GM/50ML
400.0000 mg/m2 | Freq: Once | INTRAVENOUS | Status: AC
  Administered 2021-09-04: 750 mg via INTRAVENOUS
  Filled 2021-09-04: qty 15

## 2021-09-04 MED ORDER — DEXTROSE 5 % IV SOLN: Freq: Once | INTRAVENOUS | Status: AC

## 2021-09-04 MED ORDER — SODIUM CHLORIDE 0.9 % IV SOLN
400.0000 mg/m2 | Freq: Once | INTRAVENOUS | Status: AC
  Administered 2021-09-04: 764 mg via INTRAVENOUS
  Filled 2021-09-04: qty 38.2

## 2021-09-04 NOTE — Patient Instructions (Addendum)
[facility]   Discharge Instructions: Thank you for choosing [facility] to provide your oncology and hematology care.   If you have a lab appointment with the [facility], please go directly to the [facility] and check in at the registration area.   Wear comfortable clothing and clothing appropriate for easy access to any Portacath or PICC line.   We strive to give you quality time with your provider. You may need to reschedule your appointment if you arrive late (15 or more minutes).  Arriving late affects you and other patients whose appointments are after yours.  Also, if you miss three or more appointments without notifying the office, you may be dismissed from the clinic at the providers discretion.      For prescription refill requests, have your pharmacy contact our office and allow 72 hours for refills to be completed.    Today you received the following chemotherapy and/or immunotherapy agents Leucovorin & Flourouracil (ADRUCIL).      To help prevent nausea and vomiting after your treatment, we encourage you to take your nausea medication as directed.  BELOW ARE SYMPTOMS THAT SHOULD BE REPORTED IMMEDIATELY: *FEVER GREATER THAN 100.4 F (38 C) OR HIGHER *CHILLS OR SWEATING *NAUSEA AND VOMITING THAT IS NOT CONTROLLED WITH YOUR NAUSEA MEDICATION *UNUSUAL SHORTNESS OF BREATH *UNUSUAL BRUISING OR BLEEDING *URINARY PROBLEMS (pain or burning when urinating, or frequent urination) *BOWEL PROBLEMS (unusual diarrhea, constipation, pain near the anus) TENDERNESS IN MOUTH AND THROAT WITH OR WITHOUT PRESENCE OF ULCERS (sore throat, sores in mouth, or a toothache) UNUSUAL RASH, SWELLING OR PAIN  UNUSUAL VAGINAL DISCHARGE OR ITCHING   Items with * indicate a potential emergency and should be followed up as soon as possible or go to the Emergency Department if any problems should occur.  Please show the CHEMOTHERAPY ALERT CARD or IMMUNOTHERAPY  ALERT CARD at check-in to the Emergency Department and triage nurse.  Should you have questions after your visit or need to cancel or reschedule your appointment, please contact [facility]  Dept: [phone]  and follow the prompts.  Office hours are 8:00 a.m. to 4:30 p.m. Monday - Friday. Please note that voicemails left after 4:00 p.m. may not be returned until the following business day.  We are closed weekends and major holidays. You have access to a nurse at all times for urgent questions. Please call the main number to the clinic Dept: [phone] and follow the prompts.   For any non-urgent questions, you may also contact your provider using MyChart. We now offer e-Visits for anyone 58 and older to request care online for non-urgent symptoms. For details visit [email or web address].   Also download the MyChart app! Go to the app store, search "MyChart", open the app, select [facility], and log in with your MyChart username and password.  Due to Covid, a mask is required upon entering the hospital/clinic. If you do not have a mask, one will be given to you upon arrival. For doctor visits, patients may have 1 support person aged 71 or older with them. For treatment visits, patients cannot have anyone with them due to current Covid guidelines and our immunocompromised population.   Leucovorin injection What is this medication? LEUCOVORIN (loo koe VOR in) is used to prevent or treat the harmful effects of some medicines. This medicine is used to treat anemia caused by a low amount of folic acid in the body. It is also used  with 5-fluorouracil (5-FU) to treat colon cancer. This medicine may be used for other purposes; ask your health care provider or pharmacist if you have questions. What should I tell my care team before I take this medication? They need to know if you have any of these conditions: anemia from low levels of vitamin B-12 in the blood an unusual or  allergic reaction to leucovorin, folic acid, other medicines, foods, dyes, or preservatives pregnant or trying to get pregnant breast-feeding How should I use this medication? This medicine is for injection into a muscle or into a vein. It is given by a health care professional in a hospital or clinic setting. Talk to your pediatrician regarding the use of this medicine in children. Special care may be needed. Overdosage: If you think you have taken too much of this medicine contact a poison control center or emergency room at once. NOTE: This medicine is only for you. Do not share this medicine with others. What if I miss a dose? This does not apply. What may interact with this medication? capecitabine fluorouracil phenobarbital phenytoin primidone trimethoprim-sulfamethoxazole This list may not describe all possible interactions. Give your health care provider a list of all the medicines, herbs, non-prescription drugs, or dietary supplements you use. Also tell them if you smoke, drink alcohol, or use illegal drugs. Some items may interact with your medicine. What should I watch for while using this medication? Your condition will be monitored carefully while you are receiving this medicine. This medicine may increase the side effects of 5-fluorouracil, 5-FU. Tell your doctor or health care professional if you have diarrhea or mouth sores that do not get better or that get worse. What side effects may I notice from receiving this medication? Side effects that you should report to your doctor or health care professional as soon as possible: allergic reactions like skin rash, itching or hives, swelling of the face, lips, or tongue breathing problems fever, infection mouth sores unusual bleeding or bruising unusually weak or tired Side effects that usually do not require medical attention (report to your doctor or health care professional if they continue or are bothersome): constipation  or diarrhea loss of appetite nausea, vomiting This list may not describe all possible side effects. Call your doctor for medical advice about side effects. You may report side effects to FDA at 1-800-FDA-1088. Where should I keep my medication? This drug is given in a hospital or clinic and will not be stored at home. NOTE: This sheet is a summary. It may not cover all possible information. If you have questions about this medicine, talk to your doctor, pharmacist, or health care provider.  2022 Elsevier/Gold Standard (2008-03-17 00:00:00)  Fluorouracil, 5-FU injection What is this medication? FLUOROURACIL, 5-FU (flure oh YOOR a sil) is a chemotherapy drug. It slows the growth of cancer cells. This medicine is used to treat many types of cancer like breast cancer, colon or rectal cancer, pancreatic cancer, and stomach cancer. This medicine may be used for other purposes; ask your health care provider or pharmacist if you have questions. COMMON BRAND NAME(S): Adrucil What should I tell my care team before I take this medication? They need to know if you have any of these conditions: blood disorders dihydropyrimidine dehydrogenase (DPD) deficiency infection (especially a virus infection such as chickenpox, cold sores, or herpes) kidney disease liver disease malnourished, poor nutrition recent or ongoing radiation therapy an unusual or allergic reaction to fluorouracil, other chemotherapy, other medicines, foods, dyes, or preservatives  pregnant or trying to get pregnant breast-feeding How should I use this medication? This drug is given as an infusion or injection into a vein. It is administered in a hospital or clinic by a specially trained health care professional. Talk to your pediatrician regarding the use of this medicine in children. Special care may be needed. Overdosage: If you think you have taken too much of this medicine contact a poison control center or emergency room at  once. NOTE: This medicine is only for you. Do not share this medicine with others. What if I miss a dose? It is important not to miss your dose. Call your doctor or health care professional if you are unable to keep an appointment. What may interact with this medication? Do not take this medicine with any of the following medications: live virus vaccines This medicine may also interact with the following medications: medicines that treat or prevent blood clots like warfarin, enoxaparin, and dalteparin This list may not describe all possible interactions. Give your health care provider a list of all the medicines, herbs, non-prescription drugs, or dietary supplements you use. Also tell them if you smoke, drink alcohol, or use illegal drugs. Some items may interact with your medicine. What should I watch for while using this medication? Visit your doctor for checks on your progress. This drug may make you feel generally unwell. This is not uncommon, as chemotherapy can affect healthy cells as well as cancer cells. Report any side effects. Continue your course of treatment even though you feel ill unless your doctor tells you to stop. In some cases, you may be given additional medicines to help with side effects. Follow all directions for their use. Call your doctor or health care professional for advice if you get a fever, chills or sore throat, or other symptoms of a cold or flu. Do not treat yourself. This drug decreases your body's ability to fight infections. Try to avoid being around people who are sick. This medicine may increase your risk to bruise or bleed. Call your doctor or health care professional if you notice any unusual bleeding. Be careful brushing and flossing your teeth or using a toothpick because you may get an infection or bleed more easily. If you have any dental work done, tell your dentist you are receiving this medicine. Avoid taking products that contain aspirin,  acetaminophen, ibuprofen, naproxen, or ketoprofen unless instructed by your doctor. These medicines may hide a fever. Do not become pregnant while taking this medicine. Women should inform their doctor if they wish to become pregnant or think they might be pregnant. There is a potential for serious side effects to an unborn child. Talk to your health care professional or pharmacist for more information. Do not breast-feed an infant while taking this medicine. Men should inform their doctor if they wish to father a child. This medicine may lower sperm counts. Do not treat diarrhea with over the counter products. Contact your doctor if you have diarrhea that lasts more than 2 days or if it is severe and watery. This medicine can make you more sensitive to the sun. Keep out of the sun. If you cannot avoid being in the sun, wear protective clothing and use sunscreen. Do not use sun lamps or tanning beds/booths. What side effects may I notice from receiving this medication? Side effects that you should report to your doctor or health care professional as soon as possible: allergic reactions like skin rash, itching or hives, swelling of the  face, lips, or tongue low blood counts - this medicine may decrease the number of white blood cells, red blood cells and platelets. You may be at increased risk for infections and bleeding. signs of infection - fever or chills, cough, sore throat, pain or difficulty passing urine signs of decreased platelets or bleeding - bruising, pinpoint red spots on the skin, black, tarry stools, blood in the urine signs of decreased red blood cells - unusually weak or tired, fainting spells, lightheadedness breathing problems changes in vision chest pain mouth sores nausea and vomiting pain, swelling, redness at site where injected pain, tingling, numbness in the hands or feet redness, swelling, or sores on hands or feet stomach pain unusual bleeding Side effects that usually  do not require medical attention (report to your doctor or health care professional if they continue or are bothersome): changes in finger or toe nails diarrhea dry or itchy skin hair loss headache loss of appetite sensitivity of eyes to the light stomach upset unusually teary eyes This list may not describe all possible side effects. Call your doctor for medical advice about side effects. You may report side effects to FDA at 1-800-FDA-1088. Where should I keep my medication? This drug is given in a hospital or clinic and will not be stored at home. NOTE: This sheet is a summary. It may not cover all possible information. If you have questions about this medicine, talk to your doctor, pharmacist, or health care provider.  2022 Elsevier/Gold Standard (2021-05-29 00:00:00)  The chemotherapy medication bag should finish at 46 hours, 96 hours, or 7 days. For example, if your pump is scheduled for 46 hours and it was put on at 4:00 p.m., it should finish at 2:00 p.m. the day it is scheduled to come off regardless of your appointment time.     Estimated time to finish at 10:30 a.m. on Thursday 09/06/2021.   If the display on your pump reads "Low Volume" and it is beeping, take the batteries out of the pump and come to the cancer center for it to be taken off.   If the pump alarms go off prior to the pump reading "Low Volume" then call [phone] and someone can assist you.  If the plunger comes out and the chemotherapy medication is leaking out, please use your home chemo spill kit to clean up the spill. Do NOT use paper towels or other household products.  If you have problems or questions regarding your pump, please call either [phone] (24 hours a day) or the cancer center Monday-Friday 8:00 a.m.- 4:30 p.m. at the clinic number and we will assist you. If you are unable to get assistance, then go to the nearest Emergency Department and ask the staff to contact the IV team for  assistance.

## 2021-09-04 NOTE — Progress Notes (Signed)
[facility] OFFICE PROGRESS NOTE   Diagnosis: Rectal cancer  INTERVAL HISTORY:   Neil Perry completed a cycle of 5-fluorouracil on 08/21/2021.  He continues to have tingling in the fingers.  No numbness.  No tingling in the feet.  He has constipation.  This is relieved with MiraLAX.  No other complaint.  No further falls.  He has noted an improved appetite.  Objective:  Vital signs in last 24 hours:  Blood pressure (!) 155/66, pulse 61, temperature 98.1 F (36.7 C), temperature source Oral, resp. rate 20, height 5' 9"  (1.753 m), weight 165 lb 6.4 oz (75 kg), SpO2 100 %.    HEENT: No thrush or ulcers Resp: Lungs clear bilaterally Cardio: Regular rate and rhythm GI: No hepatosplenomegaly  Vascular: No leg edema    Portacath/PICC-without erythema  Lab Results:  Lab Results  Component Value Date   WBC 3.8 (L) 09/04/2021   HGB 11.7 (L) 09/04/2021   HCT 34.9 (L) 09/04/2021   MCV 91.1 09/04/2021   PLT 172 09/04/2021   NEUTROABS 2.7 09/04/2021    CMP  Lab Results  Component Value Date   NA 140 08/21/2021   K 4.0 08/21/2021   CL 107 08/21/2021   CO2 24 08/21/2021   GLUCOSE 127 (H) 08/21/2021   BUN 16 08/21/2021   CREATININE 0.97 08/21/2021   CALCIUM 10.0 08/21/2021   PROT 6.2 (L) 08/21/2021   ALBUMIN 4.1 08/21/2021   AST 20 08/21/2021   ALT 22 08/21/2021   ALKPHOS 99 08/21/2021   BILITOT 0.7 08/21/2021   GFRNONAA >60 08/21/2021    Lab Results  Component Value Date   CEA 28.79 (H) 08/07/2021     Medications: I have reviewed the patient's current medications.   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; foundation  1-microsatellite stable, tumor mutation burden 5, K-rasG 12V, NRAS wildtype Colonoscopy 05/22/2021-ulcerated partially obstructing mass at 15 cm from anal verge CTs 05/30/2021-numerous small pulmonary nodules concerning for metastases, thickening of the rectum, multiple liver metastases Cycle 1 FOLFOX 06/12/2021 Cycle 2 FOLFOX 06/26/2021 Cycle 3 FOLFOX 07/10/2021, oxaliplatin dose reduced due to progressive decline in the Axis and platelet count Cycle 4 FOLFOX 07/24/2021 Cycle 5 FOLFOX 08/07/2021, 5-FU bolus eliminated and oxaliplatin dose reduced, insurance would not approve Udenyca CTs 08/15/2021-decrease size of lung nodules, decreased hepatic metastases, persistent anorectal wall thickening, stable right renal mass Cycle 6 FOLFOX 08/21/2021, 5-FU bolus and 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 Cycle 7 FOLFOX 09/04/2021, 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 Prostate cancer-simple prostatectomy January 2019, Gleason 3+3, 10 to 12% of specimen Active surveillance, biopsy May 2019 with small focus of Gleason 3+3 disease in 1/6 cores, surveillance continued Elevated PSA 04/20/2020 Biopsy 06/19/2020 8/12 cores positive, Gleason 4+4 PET scan 07/07/2020-negative CT 07/07/2020 right iliac and retrocaval adenopathy, 1.3 cm subcapsular right lower pole renal lesion Androgen deprivation therapy beginning 08/21/2020 Radiation to prostate, seminal vesicles, and pelvic lymph nodes to 10/22 - 12/28/2020 He continues every 55-monthFirmagon and daily Xtandi   3.  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  Mitral valve prolapse 5.  Family history of prostate cancer 6.  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  Oxaliplatin will be held from the  chemotherapy regimen again today.  He will complete a cycle of 5-FU/leucovorin today.  He will return for an office visit and chemotherapy in 2 weeks.  We will plan for restaging CTs after cycle 10 FOLFOX and decide on changing to a maintenance regimen.  We will consider resuming oxaliplatin when he returns in 2 weeks.  Neil Coder, MD  09/04/2021  10:08 AM

## 2021-09-04 NOTE — Progress Notes (Signed)
Patient presents for treatment. RN assessment completed along with the following:  Labs/vitals reviewed - Yes, and within treatment parameters.   Weight within 10% of previous measurement - Yes Oncology Treatment Attestation completed for current therapy- Yes, on date 03/15/2021 Informed consent completed and reflects current therapy/intent - Yes, on date 03/20/2021             Provider progress note reviewed - Yes, today's provider note was reviewed. Treatment/Antibody/Supportive plan reviewed - Yes, and Per Dr. [name] patient to receive Leucovorin/5FU only today due to continuous neuropathy symptoms. S&H and other orders reviewed - Yes, and there are no additional orders identified. Previous treatment date reviewed - Yes, and the appropriate amount of time has elapsed between treatments. Clinic Hand Off Received from - Yes from [place], [place].  Patient to proceed with treatment.

## 2021-09-04 NOTE — Telephone Encounter (Signed)
Patient seen by Dr. [name] today  Vitals are within treatment parameters.  Labs reviewed by Dr. [name] and are within treatment parameters.  Per physician team, patient is ready for treatment. Please note that modifications are being made to the treatment plan including Premeds and oxaliplatin taken out of treatment today. Infusion Nurse and Pharmacy notified Roselind Messier)

## 2021-09-06 ENCOUNTER — Inpatient Hospital Stay: Payer: Medicare Other

## 2021-09-06 ENCOUNTER — Other Ambulatory Visit: Payer: Self-pay

## 2021-09-06 VITALS — BP 123/62 | HR 64 | Temp 97.8°F | Resp 20

## 2021-09-06 DIAGNOSIS — Z5111 Encounter for antineoplastic chemotherapy: Secondary | ICD-10-CM | POA: Diagnosis not present

## 2021-09-06 DIAGNOSIS — C20 Malignant neoplasm of rectum: Secondary | ICD-10-CM

## 2021-09-06 MED ORDER — HEPARIN SOD (PORK) LOCK FLUSH 100 UNIT/ML IV SOLN
500.0000 [IU] | Freq: Once | INTRAVENOUS | Status: AC
  Administered 2021-09-06: 500 [IU] via INTRAVENOUS

## 2021-09-06 MED ORDER — SODIUM CHLORIDE 0.9% FLUSH
10.0000 mL | Freq: Once | INTRAVENOUS | Status: AC
  Administered 2021-09-06: 10 mL via INTRAVENOUS

## 2021-09-13 ENCOUNTER — Ambulatory Visit (HOSPITAL_BASED_OUTPATIENT_CLINIC_OR_DEPARTMENT_OTHER): Payer: Medicare Other | Admitting: Physical Therapy

## 2021-09-13 NOTE — Therapy (Incomplete)
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   CKD (chronic kidney disease) stage 3, GFR 30-59 ml/min ([place]) 03/04/2018   Labile hypertension 03/04/2018   Hyperglycemia 03/04/2018   Screening for deficiency anemia 01/28/2018   Screening for malignant neoplasm of colon 01/28/2018   Screening for malignant neoplasm of prostate 01/28/2018   Goals of care, counseling/discussion 01/28/2018   Mitral valve prolapse 01/28/2018   Mixed hyperlipidemia 01/28/2018   Bilateral cataracts 01/28/2018   Essential hypertension 01/28/2018   Malignant tumor of prostate ([place]) 01/28/2018    PCP: Vivi Barrack, MD  REFERRING PROVIDER: Vivi Barrack, MD  REFERRING DIAG: C20 (ICD-10-CM) - Rectal cancer    THERAPY DIAG:  No diagnosis found.  ONSET DATE: ***  SUBJECTIVE:   SUBJECTIVE STATEMENT: He presented to the emergency room 08/15/2021 with bilateral leg weakness. He also had a 1 minute episode of slurred speech. He was admitted. He was discharged 08/16/2021 with suspicion of a TIA. A CT head and brain MRI were unremarkable. He has intermittent peripheral numbness.   MD script indicated deconditioning, weakness--post hospitalization  PERTINENT HISTORY: Lung nodules, liver metastases, stable R renal mass, Prostate cancer-simple prostatectomy January 2019, CKD (chronic kidney disease) stage 3   PAIN:  Are you having pain? {yes/no:20286} VAS scale: ***/10 Pain  location: *** Pain orientation: {Pain Orientation:25161}  PAIN TYPE: {type:313116} Pain description: {PAIN DESCRIPTION:21022940}  Aggravating factors: *** Relieving factors: ***  PRECAUTIONS: {Therapy precautions:24002}  WEIGHT BEARING RESTRICTIONS {Yes ***/No:24003}  FALLS:  Has patient fallen in last 6 months? {yes/no:20286}, Number of falls: ***  LIVING ENVIRONMENT: Lives with: {[facility] lives with:25569::"lives with their family"} Lives in: {Lives in:25570} Stairs: {yes/no:20286}; {Stairs:24000} Has following equipment at home: {Assistive devices:23999}  OCCUPATION: ***  PLOF: {PLOF:24004}  PATIENT GOALS ***   OBJECTIVE:   DIAGNOSTIC FINDINGS: (Per Epic Reports) -CTs 08/15/2021-decrease size of lung nodules, decreased hepatic metastases, persistent anorectal wall thickening, stable right renal mass  -CTs 05/30/2021-numerous small pulmonary nodules concerning for metastases, thickening of the rectum, multiple liver metastases  -MRI on 07/25/20 Degenerative endplate findings in the lumbar spine along with spondylosis and degenerative disc disease. -Right renal mass consistent with a renal cell carcinoma-stable on MRI abdomen 04/25/2021, stable on CT 08/15/2021   PATIENT SURVEYS:  {rehab surveys:24030}  COGNITION:  Overall cognitive status: {cognition:24006}     SENSATION:  Light touch: {intact/deficits:24005}  Stereognosis: {intact/deficits:24005}  Hot/Cold: {intact/deficits:24005}  Proprioception: {intact/deficits:24005}  MUSCLE LENGTH: Hamstrings: Right *** deg; Left *** deg Thomas test: Right *** deg; Left *** deg  POSTURE:  ***  PALPATION: ***  LE AROM/PROM:  A/PROM Right 09/13/2021 Left 09/13/2021  Hip flexion    Hip extension    Hip abduction    Hip adduction    Hip internal rotation    Hip external rotation    Knee flexion    Knee extension    Ankle dorsiflexion    Ankle  plantarflexion    Ankle inversion    Ankle eversion     (Blank rows = not  tested)  LE MMT:  MMT Right 09/13/2021 Left 09/13/2021  Hip flexion    Hip extension    Hip abduction    Hip adduction    Hip internal rotation    Hip external rotation    Knee flexion    Knee extension    Ankle dorsiflexion    Ankle plantarflexion    Ankle inversion    Ankle eversion     (Blank rows = not tested)  LOWER EXTREMITY SPECIAL TESTS:  {LEspecialtests:26242}  FUNCTIONAL TESTS:  {Functional tests:24029}  GAIT: Distance walked: *** Assistive device utilized: {Assistive devices:23999} Level of assistance: {Levels of assistance:24026} Comments: ***    TODAY'S TREATMENT: ***   PATIENT EDUCATION:  Education details: *** Person educated: {Person educated:25204} Education method: {Education Method:25205} Education comprehension: {Education Comprehension:25206}   HOME EXERCISE PROGRAM: ***  ASSESSMENT:  CLINICAL IMPRESSION: Patient is a *** y.o. *** who was seen today for physical therapy evaluation and treatment for ***. Objective impairments include {opptimpairments:25111}. These impairments are limiting patient from {activity limitations:25113}. Personal factors including {Personal factors:25162} are also affecting patient's functional outcome. Patient will benefit from skilled PT to address above impairments and improve overall function.  REHAB POTENTIAL: {rehabpotential:25112}  CLINICAL DECISION MAKING: {clinical decision making:25114}  EVALUATION COMPLEXITY: {Evaluation complexity:25115}   GOALS: Goals reviewed with patient? {yes/no:20286}  SHORT TERM GOALS:  STG Name Target Date Goal status  1 *** Baseline:  {follow up:25551} {GOALSTATUS:25110}  2 *** Baseline:  {follow up:25551} {GOALSTATUS:25110}  3 *** Baseline: {follow up:25551} {GOALSTATUS:25110}  4 *** Baseline: {follow up:25551} {GOALSTATUS:25110}  5 *** Baseline: {follow up:25551} {GOALSTATUS:25110}  6 *** Baseline: {follow up:25551} {GOALSTATUS:25110}  7 *** Baseline:  {follow up:25551} {GOALSTATUS:25110}   LONG TERM GOALS:   LTG Name Target Date Goal status  1 *** Baseline: {follow up:25551} {GOALSTATUS:25110}  2 *** Baseline: {follow up:25551} {GOALSTATUS:25110}  3 *** Baseline: {follow up:25551} {GOALSTATUS:25110}  4 *** Baseline: {follow up:25551} {GOALSTATUS:25110}  5 *** Baseline: {follow up:25551} {GOALSTATUS:25110}  6 *** Baseline: {follow up:25551} {GOALSTATUS:25110}  7 *** Baseline: {follow up:25551} {GOALSTATUS:25110}   PLAN: PT FREQUENCY: {rehab frequency:25116}  PT DURATION: {rehab duration:25117}  PLANNED INTERVENTIONS: {rehab planned interventions:25118::"Therapeutic exercises","Therapeutic activity","Neuro Muscular re-education","Balance training","Gait training","Patient/Family education","Joint mobilization"}  PLAN FOR NEXT SESSION: ***   Ronny Flurry 09/13/2021, 7:08 AM

## 2021-09-17 ENCOUNTER — Other Ambulatory Visit: Payer: Self-pay | Admitting: Oncology

## 2021-09-18 ENCOUNTER — Inpatient Hospital Stay: Payer: Medicare Other

## 2021-09-18 ENCOUNTER — Other Ambulatory Visit: Payer: Self-pay

## 2021-09-18 ENCOUNTER — Inpatient Hospital Stay (HOSPITAL_BASED_OUTPATIENT_CLINIC_OR_DEPARTMENT_OTHER): Payer: Medicare Other | Admitting: Oncology

## 2021-09-18 ENCOUNTER — Telehealth: Payer: Self-pay

## 2021-09-18 VITALS — BP 165/77 | HR 67 | Temp 97.8°F | Resp 18

## 2021-09-18 VITALS — BP 142/77 | HR 62 | Temp 98.2°F | Resp 18 | Ht 69.0 in | Wt 166.1 lb

## 2021-09-18 DIAGNOSIS — C20 Malignant neoplasm of rectum: Secondary | ICD-10-CM

## 2021-09-18 DIAGNOSIS — Z95828 Presence of other vascular implants and grafts: Secondary | ICD-10-CM

## 2021-09-18 DIAGNOSIS — Z5111 Encounter for antineoplastic chemotherapy: Secondary | ICD-10-CM | POA: Diagnosis not present

## 2021-09-18 LAB — CBC WITH DIFFERENTIAL (CANCER CENTER ONLY)
Abs Immature Granulocytes: 0.01 10*3/uL (ref 0.00–0.07)
Basophils Absolute: 0 10*3/uL (ref 0.0–0.1)
Basophils Relative: 0 %
Eosinophils Absolute: 0.2 10*3/uL (ref 0.0–0.5)
Eosinophils Relative: 5 %
HCT: 36.9 % — ABNORMAL LOW (ref 39.0–52.0)
Hemoglobin: 12.5 g/dL — ABNORMAL LOW (ref 13.0–17.0)
Immature Granulocytes: 0 %
Lymphocytes Relative: 19 %
Lymphs Abs: 0.6 10*3/uL — ABNORMAL LOW (ref 0.7–4.0)
MCH: 30.9 pg (ref 26.0–34.0)
MCHC: 33.9 g/dL (ref 30.0–36.0)
MCV: 91.1 fL (ref 80.0–100.0)
Monocytes Absolute: 0.3 10*3/uL (ref 0.1–1.0)
Monocytes Relative: 11 %
Neutro Abs: 2 10*3/uL (ref 1.7–7.7)
Neutrophils Relative %: 65 %
Platelet Count: 179 10*3/uL (ref 150–400)
RBC: 4.05 MIL/uL — ABNORMAL LOW (ref 4.22–5.81)
RDW: 16.5 % — ABNORMAL HIGH (ref 11.5–15.5)
WBC Count: 3.2 10*3/uL — ABNORMAL LOW (ref 4.0–10.5)
nRBC: 0 % (ref 0.0–0.2)

## 2021-09-18 LAB — CMP (CANCER CENTER ONLY)
ALT: 13 U/L (ref 0–44)
AST: 15 U/L (ref 15–41)
Albumin: 4 g/dL (ref 3.5–5.0)
Alkaline Phosphatase: 62 U/L (ref 38–126)
Anion gap: 8 (ref 5–15)
BUN: 15 mg/dL (ref 8–23)
CO2: 25 mmol/L (ref 22–32)
Calcium: 9.4 mg/dL (ref 8.9–10.3)
Chloride: 107 mmol/L (ref 98–111)
Creatinine: 0.98 mg/dL (ref 0.61–1.24)
GFR, Estimated: >60 mL/min (ref >60)
Glucose, Bld: 103 mg/dL — ABNORMAL HIGH (ref 70–99)
Potassium: 4 mmol/L (ref 3.5–5.1)
Sodium: 140 mmol/L (ref 135–145)
Total Bilirubin: 0.6 mg/dL (ref 0.3–1.2)
Total Protein: 5.9 g/dL — ABNORMAL LOW (ref 6.5–8.1)

## 2021-09-18 LAB — CEA (ACCESS): CEA (CHCC): 4.29 ng/mL (ref 0.00–5.00)

## 2021-09-18 MED ORDER — FLUOROURACIL CHEMO INJECTION 2.5 GM/50ML
400.0000 mg/m2 | Freq: Once | INTRAVENOUS | Status: AC
  Administered 2021-09-18: 12:00:00 750 mg via INTRAVENOUS
  Filled 2021-09-18: qty 15

## 2021-09-18 MED ORDER — SODIUM CHLORIDE 0.9 % IV SOLN: Freq: Once | INTRAVENOUS | Status: AC

## 2021-09-18 MED ORDER — SODIUM CHLORIDE 0.9 % IV SOLN
2000.0000 mg/m2 | INTRAVENOUS | Status: DC
  Administered 2021-09-18: 12:00:00 3800 mg via INTRAVENOUS
  Filled 2021-09-18: qty 76

## 2021-09-18 MED ORDER — SODIUM CHLORIDE 0.9 % IV SOLN
400.0000 mg/m2 | Freq: Once | INTRAVENOUS | Status: AC
  Administered 2021-09-18: 11:00:00 764 mg via INTRAVENOUS
  Filled 2021-09-18: qty 38.2

## 2021-09-18 NOTE — Telephone Encounter (Signed)
Patient seen by Dr. [name] today  Vitals are within treatment parameters.  Labs reviewed by Dr. [name] and are within treatment parameters.  Per physician team, patient is ready for treatment. Please note that modifications are being made to the treatment plan including   Pt to receive 5 FU an leucovorin only.

## 2021-09-18 NOTE — Patient Instructions (Addendum)
[place]   Discharge Instructions: Thank you for choosing [facility] to provide your oncology and hematology care.   If you have a lab appointment with the [facility], please go directly to the [facility] and check in at the registration area.   Wear comfortable clothing and clothing appropriate for easy access to any Portacath or PICC line.   We strive to give you quality time with your provider. You may need to reschedule your appointment if you arrive late (15 or more minutes).  Arriving late affects you and other patients whose appointments are after yours.  Also, if you miss three or more appointments without notifying the office, you may be dismissed from the clinic at the providers discretion.      For prescription refill requests, have your pharmacy contact our office and allow 72 hours for refills to be completed.    Today you received the following chemotherapy and/or immunotherapy agents Leucovorin & Flourouracil (ADRUCIL).      To help prevent nausea and vomiting after your treatment, we encourage you to take your nausea medication as directed.  BELOW ARE SYMPTOMS THAT SHOULD BE REPORTED IMMEDIATELY: *FEVER GREATER THAN 100.4 F (38 C) OR HIGHER *CHILLS OR SWEATING *NAUSEA AND VOMITING THAT IS NOT CONTROLLED WITH YOUR NAUSEA MEDICATION *UNUSUAL SHORTNESS OF BREATH *UNUSUAL BRUISING OR BLEEDING *URINARY PROBLEMS (pain or burning when urinating, or frequent urination) *BOWEL PROBLEMS (unusual diarrhea, constipation, pain near the anus) TENDERNESS IN MOUTH AND THROAT WITH OR WITHOUT PRESENCE OF ULCERS (sore throat, sores in mouth, or a toothache) UNUSUAL RASH, SWELLING OR PAIN  UNUSUAL VAGINAL DISCHARGE OR ITCHING   Items with * indicate a potential emergency and should be followed up as soon as possible or go to the Emergency Department if any problems should occur.  Please show the CHEMOTHERAPY ALERT CARD or IMMUNOTHERAPY  ALERT CARD at check-in to the Emergency Department and triage nurse.  Should you have questions after your visit or need to cancel or reschedule your appointment, please contact [facility]  Dept: [phone]  and follow the prompts.  Office hours are 8:00 a.m. to 4:30 p.m. Monday - Friday. Please note that voicemails left after 4:00 p.m. may not be returned until the following business day.  We are closed weekends and major holidays. You have access to a nurse at all times for urgent questions. Please call the main number to the clinic Dept: [phone] and follow the prompts.   For any non-urgent questions, you may also contact your provider using MyChart. We now offer e-Visits for anyone 36 and older to request care online for non-urgent symptoms. For details visit [email or web address].   Also download the MyChart app! Go to the app store, search "MyChart", open the app, select , and log in with your MyChart username and password.  Due to Covid, a mask is required upon entering the hospital/clinic. If you do not have a mask, one will be given to you upon arrival. For doctor visits, patients may have 1 support person aged 68 or older with them. For treatment visits, patients cannot have anyone with them due to current Covid guidelines and our immunocompromised population.   Leucovorin injection What is this medication? LEUCOVORIN (loo koe VOR in) is used to prevent or treat the harmful effects of some medicines. This medicine is used to treat anemia caused by a low amount of folic acid in the body. It is also used  with 5-fluorouracil (5-FU) to treat colon cancer. This medicine may be used for other purposes; ask your health care provider or pharmacist if you have questions. What should I tell my care team before I take this medication? They need to know if you have any of these conditions: anemia from low levels of vitamin B-12 in the blood an unusual or  allergic reaction to leucovorin, folic acid, other medicines, foods, dyes, or preservatives pregnant or trying to get pregnant breast-feeding How should I use this medication? This medicine is for injection into a muscle or into a vein. It is given by a health care professional in a hospital or clinic setting. Talk to your pediatrician regarding the use of this medicine in children. Special care may be needed. Overdosage: If you think you have taken too much of this medicine contact a poison control center or emergency room at once. NOTE: This medicine is only for you. Do not share this medicine with others. What if I miss a dose? This does not apply. What may interact with this medication? capecitabine fluorouracil phenobarbital phenytoin primidone trimethoprim-sulfamethoxazole This list may not describe all possible interactions. Give your health care provider a list of all the medicines, herbs, non-prescription drugs, or dietary supplements you use. Also tell them if you smoke, drink alcohol, or use illegal drugs. Some items may interact with your medicine. What should I watch for while using this medication? Your condition will be monitored carefully while you are receiving this medicine. This medicine may increase the side effects of 5-fluorouracil, 5-FU. Tell your doctor or health care professional if you have diarrhea or mouth sores that do not get better or that get worse. What side effects may I notice from receiving this medication? Side effects that you should report to your doctor or health care professional as soon as possible: allergic reactions like skin rash, itching or hives, swelling of the face, lips, or tongue breathing problems fever, infection mouth sores unusual bleeding or bruising unusually weak or tired Side effects that usually do not require medical attention (report to your doctor or health care professional if they continue or are bothersome): constipation  or diarrhea loss of appetite nausea, vomiting This list may not describe all possible side effects. Call your doctor for medical advice about side effects. You may report side effects to FDA at 1-800-FDA-1088. Where should I keep my medication? This drug is given in a hospital or clinic and will not be stored at home. NOTE: This sheet is a summary. It may not cover all possible information. If you have questions about this medicine, talk to your doctor, pharmacist, or health care provider.  2022 Elsevier/Gold Standard (2008-03-17 00:00:00)  Fluorouracil, 5-FU injection What is this medication? FLUOROURACIL, 5-FU (flure oh YOOR a sil) is a chemotherapy drug. It slows the growth of cancer cells. This medicine is used to treat many types of cancer like breast cancer, colon or rectal cancer, pancreatic cancer, and stomach cancer. This medicine may be used for other purposes; ask your health care provider or pharmacist if you have questions. COMMON BRAND NAME(S): Adrucil What should I tell my care team before I take this medication? They need to know if you have any of these conditions: blood disorders dihydropyrimidine dehydrogenase (DPD) deficiency infection (especially a virus infection such as chickenpox, cold sores, or herpes) kidney disease liver disease malnourished, poor nutrition recent or ongoing radiation therapy an unusual or allergic reaction to fluorouracil, other chemotherapy, other medicines, foods, dyes, or preservatives  pregnant or trying to get pregnant breast-feeding How should I use this medication? This drug is given as an infusion or injection into a vein. It is administered in a hospital or clinic by a specially trained health care professional. Talk to your pediatrician regarding the use of this medicine in children. Special care may be needed. Overdosage: If you think you have taken too much of this medicine contact a poison control center or emergency room at  once. NOTE: This medicine is only for you. Do not share this medicine with others. What if I miss a dose? It is important not to miss your dose. Call your doctor or health care professional if you are unable to keep an appointment. What may interact with this medication? Do not take this medicine with any of the following medications: live virus vaccines This medicine may also interact with the following medications: medicines that treat or prevent blood clots like warfarin, enoxaparin, and dalteparin This list may not describe all possible interactions. Give your health care provider a list of all the medicines, herbs, non-prescription drugs, or dietary supplements you use. Also tell them if you smoke, drink alcohol, or use illegal drugs. Some items may interact with your medicine. What should I watch for while using this medication? Visit your doctor for checks on your progress. This drug may make you feel generally unwell. This is not uncommon, as chemotherapy can affect healthy cells as well as cancer cells. Report any side effects. Continue your course of treatment even though you feel ill unless your doctor tells you to stop. In some cases, you may be given additional medicines to help with side effects. Follow all directions for their use. Call your doctor or health care professional for advice if you get a fever, chills or sore throat, or other symptoms of a cold or flu. Do not treat yourself. This drug decreases your body's ability to fight infections. Try to avoid being around people who are sick. This medicine may increase your risk to bruise or bleed. Call your doctor or health care professional if you notice any unusual bleeding. Be careful brushing and flossing your teeth or using a toothpick because you may get an infection or bleed more easily. If you have any dental work done, tell your dentist you are receiving this medicine. Avoid taking products that contain aspirin,  acetaminophen, ibuprofen, naproxen, or ketoprofen unless instructed by your doctor. These medicines may hide a fever. Do not become pregnant while taking this medicine. Women should inform their doctor if they wish to become pregnant or think they might be pregnant. There is a potential for serious side effects to an unborn child. Talk to your health care professional or pharmacist for more information. Do not breast-feed an infant while taking this medicine. Men should inform their doctor if they wish to father a child. This medicine may lower sperm counts. Do not treat diarrhea with over the counter products. Contact your doctor if you have diarrhea that lasts more than 2 days or if it is severe and watery. This medicine can make you more sensitive to the sun. Keep out of the sun. If you cannot avoid being in the sun, wear protective clothing and use sunscreen. Do not use sun lamps or tanning beds/booths. What side effects may I notice from receiving this medication? Side effects that you should report to your doctor or health care professional as soon as possible: allergic reactions like skin rash, itching or hives, swelling of the  face, lips, or tongue low blood counts - this medicine may decrease the number of white blood cells, red blood cells and platelets. You may be at increased risk for infections and bleeding. signs of infection - fever or chills, cough, sore throat, pain or difficulty passing urine signs of decreased platelets or bleeding - bruising, pinpoint red spots on the skin, black, tarry stools, blood in the urine signs of decreased red blood cells - unusually weak or tired, fainting spells, lightheadedness breathing problems changes in vision chest pain mouth sores nausea and vomiting pain, swelling, redness at site where injected pain, tingling, numbness in the hands or feet redness, swelling, or sores on hands or feet stomach pain unusual bleeding Side effects that usually  do not require medical attention (report to your doctor or health care professional if they continue or are bothersome): changes in finger or toe nails diarrhea dry or itchy skin hair loss headache loss of appetite sensitivity of eyes to the light stomach upset unusually teary eyes This list may not describe all possible side effects. Call your doctor for medical advice about side effects. You may report side effects to FDA at 1-800-FDA-1088. Where should I keep my medication? This drug is given in a hospital or clinic and will not be stored at home. NOTE: This sheet is a summary. It may not cover all possible information. If you have questions about this medicine, talk to your doctor, pharmacist, or health care provider.  2022 Elsevier/Gold Standard (2021-05-29 00:00:00)  The chemotherapy medication bag should finish at 46 hours, 96 hours, or 7 days. For example, if your pump is scheduled for 46 hours and it was put on at 4:00 p.m., it should finish at 2:00 p.m. the day it is scheduled to come off regardless of your appointment time.     Estimated time to finish at 10:00 a.m. on Thursday 09/20/2021.   If the display on your pump reads "Low Volume" and it is beeping, take the batteries out of the pump and come to the cancer center for it to be taken off.   If the pump alarms go off prior to the pump reading "Low Volume" then call [phone] and someone can assist you.  If the plunger comes out and the chemotherapy medication is leaking out, please use your home chemo spill kit to clean up the spill. Do NOT use paper towels or other household products.  If you have problems or questions regarding your pump, please call either [phone] (24 hours a day) or the cancer center Monday-Friday 8:00 a.m.- 4:30 p.m. at the clinic number and we will assist you. If you are unable to get assistance, then go to the nearest Emergency Department and ask the staff to contact the IV team for  assistance.

## 2021-09-18 NOTE — Patient Instructions (Signed)

## 2021-09-18 NOTE — Progress Notes (Signed)
Patient presented to treatment room for port flush with complaint of "shaking spells" upon standing that last for approximately 5 seconds.  Patient states he has had this occur for a few weeks, but they have occurred more frequently after this last pump removal/chemo treatment. Prior to this last treatment cycle, patient states it occurred once daily.  Patient denies dizziness upon standing and stated " I feel it from the neck down in my muscles.  It feels like my legs are going to go out underneath me".  Dr. [name] notified of patient complaint.  Orthostatic BP completed and reported to Dr. [name] per Dr. Gearldine Shown orders.  No new orders at this time.     Patient presents for treatment. RN assessment completed along with the following:  Labs/vitals reviewed - Yes, and within treatment parameters.   Weight within 10% of previous measurement - Yes Oncology Treatment Attestation completed for current therapy- Yes, on date 06/05/21 Informed consent completed and reflects current therapy/intent - Yes, on date 06/12/21             Provider progress note reviewed - Today's provider note is not yet available. I reviewed the most recent oncology provider progress note in chart dated 09/04/21. Treatment/Antibody/Supportive plan reviewed - Yes, and Per Dr. [name], patient to receive leucovorin and 5-FU only S&H and other orders reviewed - Yes, and there are no additional orders identified. Previous treatment date reviewed - Yes, and the appropriate amount of time has elapsed between treatments.  Patient to proceed with treatment.

## 2021-09-18 NOTE — Progress Notes (Signed)
[place] OFFICE PROGRESS NOTE   Diagnosis: Rectal cancer  INTERVAL HISTORY:   Neil Perry completed a cycle of 5-fluorouracil 09/04/2021.  No mouth sores, diarrhea, or rectal bleeding.  He reports intermittent episodes of "shaking "when he goes from sitting to standing.  This last for seconds and resolves.  No fall.  He has persistent peripheral tingling.  Intermittent numbness.  Objective:  Vital signs in last 24 hours:  Blood pressure (!) 142/77, pulse 62, temperature 98.2 F (36.8 C), temperature source Oral, resp. rate 18, height 5' 9"  (1.753 m), weight 166 lb 1.6 oz (75.3 kg), SpO2 100 %.    HEENT: No thrush, ulcer of the right buccal mucosa Resp: Lungs clear bilaterally Cardio: Regular rate and rhythm GI: No hepatomegaly, nontender Vascular: No leg edema Neuro: Alert and oriented, ambulated to the examination table without difficulty, the motor exam appears intact in the upper and lower extremities bilaterally    Portacath/PICC-without erythema  Lab Results:  Lab Results  Component Value Date   WBC 3.2 (L) 09/18/2021   HGB 12.5 (L) 09/18/2021   HCT 36.9 (L) 09/18/2021   MCV 91.1 09/18/2021   PLT 179 09/18/2021   NEUTROABS 2.0 09/18/2021    CMP  Lab Results  Component Value Date   NA 140 09/04/2021   K 3.9 09/04/2021   CL 108 09/04/2021   CO2 24 09/04/2021   GLUCOSE 108 (H) 09/04/2021   BUN 19 09/04/2021   CREATININE 0.85 09/04/2021   CALCIUM 9.3 09/04/2021   PROT 5.9 (L) 09/04/2021   ALBUMIN 3.7 09/04/2021   AST 15 09/04/2021   ALT 13 09/04/2021   ALKPHOS 58 09/04/2021   BILITOT 0.5 09/04/2021   GFRNONAA >60 09/04/2021    Lab Results  Component Value Date   CEA 6.88 (H) 09/04/2021     Medications: I have reviewed the patient's current medications.   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 2-3 new hypervascular masses in the liver dome, no abdominal lymphadenopathy, stable benign left adrenal adenoma, stable  right lower pole kidney mass Ultrasound-guided biopsy of a liver lesion 05/07/2021-metastatic adenocarcinoma with extensive necrosis, immunohistochemical profile consistent with a colorectal primary; foundation 1-microsatellite stable, tumor mutation burden 5, K-rasG 12V, NRAS wildtype Colonoscopy 05/22/2021-ulcerated partially obstructing mass at 15 cm from anal verge CTs 05/30/2021-numerous small pulmonary nodules concerning for metastases, thickening of the rectum, multiple liver metastases Cycle 1 FOLFOX 06/12/2021 Cycle 2 FOLFOX 06/26/2021 Cycle 3 FOLFOX 07/10/2021, oxaliplatin dose reduced due to progressive decline in the [place] and platelet count Cycle 4 FOLFOX 07/24/2021 Cycle 5 FOLFOX 08/07/2021, 5-FU bolus eliminated and oxaliplatin dose reduced, insurance would not approve Udenyca CTs 08/15/2021-decrease size of lung nodules, decreased hepatic metastases, persistent anorectal wall thickening, stable right renal mass Cycle 6 FOLFOX 08/21/2021, 5-FU bolus and 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 Cycle 7 FOLFOX 09/04/2021, 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 Cycle 8 FOLFOX 09/18/2021, 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 Prostate cancer-simple prostatectomy January 2019, Gleason 3+3, 10 to 12% of specimen Active surveillance, biopsy May 2019 with small focus of Gleason 3+3 disease in 1/6 cores, surveillance continued Elevated PSA 04/20/2020 Biopsy 06/19/2020 8/12 cores positive, Gleason 4+4 PET scan 07/07/2020-negative CT 07/07/2020 right iliac and retrocaval adenopathy, 1.3 cm subcapsular right lower pole renal lesion Androgen deprivation therapy beginning 08/21/2020 Radiation to prostate, seminal vesicles, and pelvic lymph nodes to 10/22 - 12/28/2020 He continues every 34-monthFirmagon and daily Xtandi   3.  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  Mitral valve  prolapse 5.  Family history of prostate cancer 6.  08/15/2021 with leg weakness and an episode of slurred speech-TIA?,  Brain imaging negative    Disposition: [name] has completed 7 cycles of systemic therapy.  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  Oxaliplatin will remain on hold.  He will complete another cycle of 5-fluorouracil today. 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  The symptoms resolved after seconds.  We will check orthostatic vital signs today and consider a trial of midodrine if he has significant orthostasis.  He will return for an office visit in 2 weeks.  Betsy Coder, MD  09/18/2021  10:17 AM

## 2021-09-20 ENCOUNTER — Inpatient Hospital Stay: Payer: Medicare Other

## 2021-09-20 ENCOUNTER — Other Ambulatory Visit: Payer: Self-pay

## 2021-09-20 VITALS — BP 144/72 | HR 83 | Temp 98.4°F | Resp 20

## 2021-09-20 DIAGNOSIS — C20 Malignant neoplasm of rectum: Secondary | ICD-10-CM

## 2021-09-20 DIAGNOSIS — Z5111 Encounter for antineoplastic chemotherapy: Secondary | ICD-10-CM | POA: Diagnosis not present

## 2021-09-20 MED ORDER — HEPARIN SOD (PORK) LOCK FLUSH 100 UNIT/ML IV SOLN
500.0000 [IU] | Freq: Once | INTRAVENOUS | Status: AC | PRN
  Administered 2021-09-20: 11:00:00 500 [IU]

## 2021-09-20 MED ORDER — SODIUM CHLORIDE 0.9% FLUSH
10.0000 mL | INTRAVENOUS | Status: DC | PRN
  Administered 2021-09-20: 11:00:00 10 mL

## 2021-09-21 ENCOUNTER — Other Ambulatory Visit: Payer: Self-pay | Admitting: *Deleted

## 2021-09-21 NOTE — Progress Notes (Signed)
Entered chart by error

## 2021-09-26 NOTE — Progress Notes (Signed)
Neil Perry [place] [address] [place] Phone: [phone] Subjective:   [name], am serving as a scribe for Dr. Hulan Perry. This visit occurred during the SARS-CoV-2 public health emergency.  Safety protocols were in place, including screening questions prior to the visit, additional usage of staff PPE, and extensive cleaning of exam room while observing appropriate contact time as indicated for disinfecting solutions.   I'm seeing this patient by the request  of:  Neil Barrack, MD  CC: Neuropathy  [id number]  Neil Perry is a 81 y.o. male coming in with complaint of neuropathy.  Patient has a past medical history significant for malignant prostate as well as rectal cancer.  Reviewing patient's chart continues to have chemotherapy infusions last 1 was December 29.  Seems to have some renal cell carcinoma as well that has been stable with most recent CT in November 2022. Sitting or laying down for a while unsteady walking for about 10 seconds. No pain or dizziness in head. Slurred speech. Wasn't happening before chemo. Tingling in hands.      Past Medical History:  Diagnosis Date   Cataract    Hypertension    Prostate cancer [facility])    Past Surgical History:  Procedure Laterality Date   cataract surgery Right    IR IMAGING GUIDED PORT INSERTION  06/04/2021   LIVER BIOPSY     August 2022   PROSTATE BIOPSY     PROSTATECTOMY  2018   Social History   Socioeconomic History   Marital status: Married    Spouse name: Not on file   Number of children: Not on file   Years of education: Not on file   Highest education level: Not on file  Occupational History   Occupation: attorney    Comment: retired  Tobacco Use   Smoking status: Never   Smokeless tobacco: Never  Vaping Use   Vaping Use: Never used  Substance and Sexual Activity   Alcohol use: Yes    Alcohol/week: 2.0 standard drinks    Types: 2 Glasses of wine per  week   Drug use: Never   Sexual activity: Not Currently  Other Topics Concern   Not on file  Social History Narrative   Not on file   Social Determinants of Health   Financial Resource Strain: Not on file  Food Insecurity: Not on file  Transportation Needs: Not on file  Physical Activity: Not on file  Stress: Not on file  Social Connections: Not on file   No Known Allergies Family History  Problem Relation Age of Onset   Prostate cancer Father    Prostate cancer Brother    Prostate cancer Paternal Grandfather    Breast cancer Neg Hx    Colon cancer Neg Hx    Pancreatic cancer Neg Hx      Current Outpatient Medications (Cardiovascular):    amLODipine (NORVASC) 5 MG tablet, TAKE 1 TABLET BY MOUTH EVERY DAY   Current Outpatient Medications (Analgesics):    aspirin EC 81 MG EC tablet, Take 1 tablet (81 mg total) by mouth daily. Swallow whole.   Current Outpatient Medications (Other):    clotrimazole-betamethasone (LOTRISONE) cream, Apply topically 2 (two) times daily.   docusate sodium (COLACE) 100 MG capsule, Take 100 mg by mouth daily.   enzalutamide (XTANDI) 40 MG tablet, Take 80 mg by mouth daily.   lidocaine-prilocaine (EMLA) cream, Apply 1 application topically as directed. Apply 1/2 tablespoon to port site  2 hours prior to stick and cover with Press and Seal to numb site   Melatonin 3 MG CAPS, Take 3 mg by mouth at bedtime as needed (sleep).   Reviewed prior external information including notes and imaging from  primary care provider As well as notes that were available from care everywhere and other healthcare systems.  Past medical history, social, surgical and family history all reviewed in electronic medical record.  No pertanent information unless stated regarding to the chief complaint.   Review of Systems:  No headache, visual changes, nausea, vomiting, diarrhea, constipation, dizziness, abdominal pain, skin rash, fevers, chills, night sweats, swollen  lymph nodes, body aches, joint swelling, chest pain, shortness of breath, mood changes. POSITIVE muscle aches, weight loss, numbness.   Objective  Blood pressure 136/78, pulse 63, height 5\' 9"  (1.753 m), weight 169 lb (76.7 kg), SpO2 97 %.   General: No apparent distress alert and oriented x3 mood and affect normal, dressed appropriately.  HEENT: Pupils equal, extraocular movements intact  Respiratory: Patient's speak in full sentences and does not appear short of breath  Cardiovascular: No lower extremity edema, non tender, no erythema  Gait patient does appear to be cachectic.  He does have significant antalgic gait.  Patient has significant weakness of the hip abductors bilaterally.  He has some neuropathy of the lower extremity is noted today.   Impression and Recommendations:     The above documentation has been reviewed and is accurate and complete Neil Pulley, DO

## 2021-09-27 ENCOUNTER — Ambulatory Visit (INDEPENDENT_AMBULATORY_CARE_PROVIDER_SITE_OTHER): Payer: Medicare Other

## 2021-09-27 ENCOUNTER — Other Ambulatory Visit: Payer: Self-pay

## 2021-09-27 ENCOUNTER — Ambulatory Visit (INDEPENDENT_AMBULATORY_CARE_PROVIDER_SITE_OTHER): Payer: Medicare Other | Admitting: Family Medicine

## 2021-09-27 VITALS — BP 136/78 | HR 63 | Ht 69.0 in | Wt 169.0 lb

## 2021-09-27 DIAGNOSIS — M549 Dorsalgia, unspecified: Secondary | ICD-10-CM | POA: Diagnosis not present

## 2021-09-27 DIAGNOSIS — E538 Deficiency of other specified B group vitamins: Secondary | ICD-10-CM

## 2021-09-27 DIAGNOSIS — M2550 Pain in unspecified joint: Secondary | ICD-10-CM

## 2021-09-27 DIAGNOSIS — M6281 Muscle weakness (generalized): Secondary | ICD-10-CM | POA: Diagnosis not present

## 2021-09-27 DIAGNOSIS — E559 Vitamin D deficiency, unspecified: Secondary | ICD-10-CM | POA: Diagnosis not present

## 2021-09-27 DIAGNOSIS — R29898 Other symptoms and signs involving the musculoskeletal system: Secondary | ICD-10-CM | POA: Insufficient documentation

## 2021-09-27 LAB — FERRITIN: Ferritin: 223.9 ng/mL (ref 22.0–322.0)

## 2021-09-27 LAB — SEDIMENTATION RATE: Sed Rate: 2 mm/hr (ref 0–20)

## 2021-09-27 LAB — URIC ACID: Uric Acid, Serum: 5.1 mg/dL (ref 4.0–7.8)

## 2021-09-27 LAB — T4, FREE: Free T4: 0.91 ng/dL (ref 0.60–1.60)

## 2021-09-27 LAB — VITAMIN D 25 HYDROXY (VIT D DEFICIENCY, FRACTURES): VITD: 42.04 ng/mL (ref 30.00–100.00)

## 2021-09-27 LAB — B12 AND FOLATE PANEL
Folate: >24.2 ng/mL (ref >5.9)
Vitamin B-12: 787 pg/mL (ref 211–911)

## 2021-09-27 LAB — T3, FREE: T3, Free: 2.7 pg/mL (ref 2.3–4.2)

## 2021-09-27 LAB — TSH: TSH: 3.53 u[IU]/mL (ref 0.35–5.50)

## 2021-09-27 NOTE — Assessment & Plan Note (Signed)
Chronic lower extremity weakness.  Discussed  Right leg.  I do believe that this is more muscle weakness.  Patient has noticed that since he has started his chemotherapy.  We will get laboratory work-up to see if anything else is potentially contributing.  In addition to this we did discuss with him about potentially increase muscle strength.  We will start with aquatic therapy.  Discussed protein supplementation but not to overdo it with patient's chronic kidney disease.  Depending on findings on labs as well as x-ray to see if anything else needs to change management at this time.  Patient understands that this may be a slow progress and understands that there is no important to stop any progression at this moment.  Patient will follow up with me again 6 weeks

## 2021-09-27 NOTE — Patient Instructions (Addendum)
Labs today Zinc 30mg  daily See you again in 6 weeks

## 2021-09-29 LAB — PTH, INTACT AND CALCIUM
Calcium: 8.8 mg/dL (ref 8.6–10.3)
PTH: 37 pg/mL (ref 16–77)

## 2021-10-02 ENCOUNTER — Other Ambulatory Visit: Payer: Medicare Other

## 2021-10-02 ENCOUNTER — Ambulatory Visit: Payer: Medicare Other

## 2021-10-02 ENCOUNTER — Ambulatory Visit: Payer: Medicare Other | Admitting: Oncology

## 2021-10-07 ENCOUNTER — Other Ambulatory Visit: Payer: Self-pay | Admitting: Oncology

## 2021-10-08 ENCOUNTER — Inpatient Hospital Stay: Payer: Medicare Other

## 2021-10-08 ENCOUNTER — Other Ambulatory Visit: Payer: Self-pay

## 2021-10-08 ENCOUNTER — Inpatient Hospital Stay: Payer: Medicare Other | Attending: Nurse Practitioner

## 2021-10-08 ENCOUNTER — Encounter: Payer: Self-pay | Admitting: *Deleted

## 2021-10-08 ENCOUNTER — Inpatient Hospital Stay (HOSPITAL_BASED_OUTPATIENT_CLINIC_OR_DEPARTMENT_OTHER): Payer: Medicare Other | Admitting: Oncology

## 2021-10-08 VITALS — BP 160/70 | HR 60 | Temp 98.7°F | Resp 19 | Ht 69.0 in | Wt 170.2 lb

## 2021-10-08 DIAGNOSIS — T451X5A Adverse effect of antineoplastic and immunosuppressive drugs, initial encounter: Secondary | ICD-10-CM | POA: Insufficient documentation

## 2021-10-08 DIAGNOSIS — C20 Malignant neoplasm of rectum: Secondary | ICD-10-CM | POA: Insufficient documentation

## 2021-10-08 DIAGNOSIS — Z8546 Personal history of malignant neoplasm of prostate: Secondary | ICD-10-CM | POA: Diagnosis not present

## 2021-10-08 DIAGNOSIS — Z79899 Other long term (current) drug therapy: Secondary | ICD-10-CM | POA: Insufficient documentation

## 2021-10-08 DIAGNOSIS — C787 Secondary malignant neoplasm of liver and intrahepatic bile duct: Secondary | ICD-10-CM | POA: Diagnosis present

## 2021-10-08 DIAGNOSIS — G620 Drug-induced polyneuropathy: Secondary | ICD-10-CM | POA: Diagnosis not present

## 2021-10-08 DIAGNOSIS — Z5111 Encounter for antineoplastic chemotherapy: Secondary | ICD-10-CM | POA: Insufficient documentation

## 2021-10-08 LAB — CBC WITH DIFFERENTIAL (CANCER CENTER ONLY)
Abs Immature Granulocytes: 0.01 10*3/uL (ref 0.00–0.07)
Basophils Absolute: 0 10*3/uL (ref 0.0–0.1)
Basophils Relative: 1 %
Eosinophils Absolute: 0.1 10*3/uL (ref 0.0–0.5)
Eosinophils Relative: 4 %
HCT: 35.4 % — ABNORMAL LOW (ref 39.0–52.0)
Hemoglobin: 12.2 g/dL — ABNORMAL LOW (ref 13.0–17.0)
Immature Granulocytes: 0 %
Lymphocytes Relative: 14 %
Lymphs Abs: 0.5 10*3/uL — ABNORMAL LOW (ref 0.7–4.0)
MCH: 31 pg (ref 26.0–34.0)
MCHC: 34.5 g/dL (ref 30.0–36.0)
MCV: 90.1 fL (ref 80.0–100.0)
Monocytes Absolute: 0.3 10*3/uL (ref 0.1–1.0)
Monocytes Relative: 8 %
Neutro Abs: 2.5 10*3/uL (ref 1.7–7.7)
Neutrophils Relative %: 73 %
Platelet Count: 147 10*3/uL — ABNORMAL LOW (ref 150–400)
RBC: 3.93 MIL/uL — ABNORMAL LOW (ref 4.22–5.81)
RDW: 15 % (ref 11.5–15.5)
WBC Count: 3.5 10*3/uL — ABNORMAL LOW (ref 4.0–10.5)
nRBC: 0 % (ref 0.0–0.2)

## 2021-10-08 LAB — CMP (CANCER CENTER ONLY)
ALT: 12 U/L (ref 0–44)
AST: 14 U/L — ABNORMAL LOW (ref 15–41)
Albumin: 3.8 g/dL (ref 3.5–5.0)
Alkaline Phosphatase: 64 U/L (ref 38–126)
Anion gap: 8 (ref 5–15)
BUN: 19 mg/dL (ref 8–23)
CO2: 25 mmol/L (ref 22–32)
Calcium: 9.5 mg/dL (ref 8.9–10.3)
Chloride: 108 mmol/L (ref 98–111)
Creatinine: 0.84 mg/dL (ref 0.61–1.24)
GFR, Estimated: >60 mL/min (ref >60)
Glucose, Bld: 102 mg/dL — ABNORMAL HIGH (ref 70–99)
Potassium: 3.8 mmol/L (ref 3.5–5.1)
Sodium: 141 mmol/L (ref 135–145)
Total Bilirubin: 0.5 mg/dL (ref 0.3–1.2)
Total Protein: 5.8 g/dL — ABNORMAL LOW (ref 6.5–8.1)

## 2021-10-08 LAB — MAGNESIUM: Magnesium: 1.9 mg/dL (ref 1.7–2.4)

## 2021-10-08 LAB — CEA (ACCESS): CEA (CHCC): 2.99 ng/mL (ref 0.00–5.00)

## 2021-10-08 MED ORDER — SODIUM CHLORIDE 0.9 % IV SOLN: INTRAVENOUS | Status: DC

## 2021-10-08 MED ORDER — LEUCOVORIN CALCIUM INJECTION 350 MG
400.0000 mg/m2 | Freq: Once | INTRAVENOUS | Status: AC
  Administered 2021-10-08: 764 mg via INTRAVENOUS
  Filled 2021-10-08: qty 25

## 2021-10-08 MED ORDER — FLUOROURACIL CHEMO INJECTION 2.5 GM/50ML
400.0000 mg/m2 | Freq: Once | INTRAVENOUS | Status: AC
  Administered 2021-10-08: 750 mg via INTRAVENOUS
  Filled 2021-10-08: qty 15

## 2021-10-08 MED ORDER — LEUCOVORIN CALCIUM INJECTION 350 MG
400.0000 mg/m2 | Freq: Once | INTRAVENOUS | Status: DC
  Filled 2021-10-08: qty 38.2

## 2021-10-08 MED ORDER — SODIUM CHLORIDE 0.9 % IV SOLN
2000.0000 mg/m2 | INTRAVENOUS | Status: DC
  Administered 2021-10-08: 3800 mg via INTRAVENOUS
  Filled 2021-10-08: qty 76

## 2021-10-08 NOTE — Progress Notes (Signed)
[place] OFFICE PROGRESS NOTE   Diagnosis: Rectal cancer  INTERVAL HISTORY:   Mr. Iannone completed another cycle of 5-fluorouracil 09/18/2021.  No mouth sores or nausea.  No rectal bleeding.  He continues to have tingling in the fingers. He reports episodes of "shaking "lasting for 10 seconds or less.  No fall.  Objective:  Vital signs in last 24 hours:  Blood pressure (!) 160/70, pulse 60, temperature 98.7 F (37.1 C), temperature source Oral, resp. rate 19, height 5' 9"  (1.753 m), weight 170 lb 3.2 oz (77.2 kg), SpO2 100 %.    HEENT: No thrush or ulcers Resp: Lungs clear bilaterally Cardio: Regular rate and rhythm GI: No mass, no hepatosplenomegaly Vascular: No leg edema   Portacath/PICC-without erythema  Lab Results:  Lab Results  Component Value Date   WBC 3.5 (L) 10/08/2021   HGB 12.2 (L) 10/08/2021   HCT 35.4 (L) 10/08/2021   MCV 90.1 10/08/2021   PLT 147 (L) 10/08/2021   NEUTROABS 2.5 10/08/2021    CMP  Lab Results  Component Value Date   NA 141 10/08/2021   K 3.8 10/08/2021   CL 108 10/08/2021   CO2 25 10/08/2021   GLUCOSE 102 (H) 10/08/2021   BUN 19 10/08/2021   CREATININE 0.84 10/08/2021   CALCIUM 9.5 10/08/2021   PROT 5.8 (L) 10/08/2021   ALBUMIN 3.8 10/08/2021   AST 14 (L) 10/08/2021   ALT 12 10/08/2021   ALKPHOS 64 10/08/2021   BILITOT 0.5 10/08/2021   GFRNONAA >60 10/08/2021    Lab Results  Component Value Date   CEA 2.99 10/08/2021    Medications: I have reviewed the patient's current medications.   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; foundation 1-microsatellite stable, tumor  mutation burden 5, K-rasG 12V, NRAS wildtype Colonoscopy 05/22/2021-ulcerated partially obstructing mass at 15 cm from anal verge CTs 05/30/2021-numerous small pulmonary nodules concerning for metastases, thickening of the rectum, multiple liver metastases Cycle 1 FOLFOX 06/12/2021 Cycle 2 FOLFOX 06/26/2021 Cycle 3 FOLFOX 07/10/2021, oxaliplatin dose reduced due to progressive decline in the [place] and platelet count Cycle 4 FOLFOX 07/24/2021 Cycle 5 FOLFOX 08/07/2021, 5-FU bolus eliminated and oxaliplatin dose reduced, insurance would not approve Udenyca CTs 08/15/2021-decrease size of lung nodules, decreased hepatic metastases, persistent anorectal wall thickening, stable right renal mass Cycle 6 FOLFOX 08/21/2021, 5-FU bolus and 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 Cycle 7 FOLFOX 09/04/2021, 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 Cycle 8 FOLFOX 09/18/2021, 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 Cycle 9 FOLFOX 10/08/2021, 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  Mitral valve prolapse 5.  Family history of prostate cancer 6.  08/15/2021 with  leg weakness and an episode of slurred speech-TIA?,  Brain imaging negative      Disposition: Mr.  Perry appears stable.  He will complete another cycle of 5-fluorouracil today.  Oxaliplatin will remain on hold secondary to neuropathy symptoms.  He will return for an office visit and chemotherapy in 2 weeks.  He will be referred for restaging CTs after the next cycle of chemotherapy.  Betsy Coder, MD  10/08/2021  1:41 PM

## 2021-10-08 NOTE — Progress Notes (Signed)
Patient seen by Dr. Sherrill today ? ?Vitals are within treatment parameters. ? ?Labs reviewed by Dr. Sherrill and are within treatment parameters. ? ?Per physician team, patient is ready for treatment and there are NO modifications to the treatment plan.  ?

## 2021-10-08 NOTE — Patient Instructions (Signed)
[facility]   Discharge Instructions: Thank you for choosing [facility] to provide your oncology and hematology care.   If you have a lab appointment with the [facility], please go directly to the [facility] and check in at the registration area.   Wear comfortable clothing and clothing appropriate for easy access to any Portacath or PICC line.   We strive to give you quality time with your provider. You may need to reschedule your appointment if you arrive late (15 or more minutes).  Arriving late affects you and other patients whose appointments are after yours.  Also, if you miss three or more appointments without notifying the office, you may be dismissed from the clinic at the providers discretion.      For prescription refill requests, have your pharmacy contact our office and allow 72 hours for refills to be completed.    Today you received the following chemotherapy and/or immunotherapy agents Leucovorin & Flourouracil (ADRUCIL).      To help prevent nausea and vomiting after your treatment, we encourage you to take your nausea medication as directed.  BELOW ARE SYMPTOMS THAT SHOULD BE REPORTED IMMEDIATELY: *FEVER GREATER THAN 100.4 F (38 C) OR HIGHER *CHILLS OR SWEATING *NAUSEA AND VOMITING THAT IS NOT CONTROLLED WITH YOUR NAUSEA MEDICATION *UNUSUAL SHORTNESS OF BREATH *UNUSUAL BRUISING OR BLEEDING *URINARY PROBLEMS (pain or burning when urinating, or frequent urination) *BOWEL PROBLEMS (unusual diarrhea, constipation, pain near the anus) TENDERNESS IN MOUTH AND THROAT WITH OR WITHOUT PRESENCE OF ULCERS (sore throat, sores in mouth, or a toothache) UNUSUAL RASH, SWELLING OR PAIN  UNUSUAL VAGINAL DISCHARGE OR ITCHING   Items with * indicate a potential emergency and should be followed up as soon as possible or go to the Emergency Department if any problems should occur.  Please show the CHEMOTHERAPY ALERT CARD or IMMUNOTHERAPY  ALERT CARD at check-in to the Emergency Department and triage nurse.  Should you have questions after your visit or need to cancel or reschedule your appointment, please contact [facility]  Dept: [phone]  and follow the prompts.  Office hours are 8:00 a.m. to 4:30 p.m. Monday - Friday. Please note that voicemails left after 4:00 p.m. may not be returned until the following business day.  We are closed weekends and major holidays. You have access to a nurse at all times for urgent questions. Please call the main number to the clinic Dept: [phone] and follow the prompts.   For any non-urgent questions, you may also contact your provider using MyChart. We now offer e-Visits for anyone 39 and older to request care online for non-urgent symptoms. For details visit [email or web address].   Also download the MyChart app! Go to the app store, search "MyChart", open the app, select [facility], and log in with your MyChart username and password.  Due to Covid, a mask is required upon entering the hospital/clinic. If you do not have a mask, one will be given to you upon arrival. For doctor visits, patients may have 1 support person aged 3 or older with them. For treatment visits, patients cannot have anyone with them due to current Covid guidelines and our immunocompromised population.   Leucovorin injection What is this medication? LEUCOVORIN (loo koe VOR in) is used to prevent or treat the harmful effects of some medicines. This medicine is used to treat anemia caused by a low amount of folic acid in the body. It is also used  with 5-fluorouracil (5-FU) to treat colon cancer. This medicine may be used for other purposes; ask your health care provider or pharmacist if you have questions. What should I tell my care team before I take this medication? They need to know if you have any of these conditions: anemia from low levels of vitamin B-12 in the blood an unusual or  allergic reaction to leucovorin, folic acid, other medicines, foods, dyes, or preservatives pregnant or trying to get pregnant breast-feeding How should I use this medication? This medicine is for injection into a muscle or into a vein. It is given by a health care professional in a hospital or clinic setting. Talk to your pediatrician regarding the use of this medicine in children. Special care may be needed. Overdosage: If you think you have taken too much of this medicine contact a poison control center or emergency room at once. NOTE: This medicine is only for you. Do not share this medicine with others. What if I miss a dose? This does not apply. What may interact with this medication? capecitabine fluorouracil phenobarbital phenytoin primidone trimethoprim-sulfamethoxazole This list may not describe all possible interactions. Give your health care provider a list of all the medicines, herbs, non-prescription drugs, or dietary supplements you use. Also tell them if you smoke, drink alcohol, or use illegal drugs. Some items may interact with your medicine. What should I watch for while using this medication? Your condition will be monitored carefully while you are receiving this medicine. This medicine may increase the side effects of 5-fluorouracil, 5-FU. Tell your doctor or health care professional if you have diarrhea or mouth sores that do not get better or that get worse. What side effects may I notice from receiving this medication? Side effects that you should report to your doctor or health care professional as soon as possible: allergic reactions like skin rash, itching or hives, swelling of the face, lips, or tongue breathing problems fever, infection mouth sores unusual bleeding or bruising unusually weak or tired Side effects that usually do not require medical attention (report to your doctor or health care professional if they continue or are bothersome): constipation  or diarrhea loss of appetite nausea, vomiting This list may not describe all possible side effects. Call your doctor for medical advice about side effects. You may report side effects to FDA at 1-800-FDA-1088. Where should I keep my medication? This drug is given in a hospital or clinic and will not be stored at home. NOTE: This sheet is a summary. It may not cover all possible information. If you have questions about this medicine, talk to your doctor, pharmacist, or health care provider.  2022 Elsevier/Gold Standard (2008-03-17 00:00:00)  Fluorouracil, 5-FU injection What is this medication? FLUOROURACIL, 5-FU (flure oh YOOR a sil) is a chemotherapy drug. It slows the growth of cancer cells. This medicine is used to treat many types of cancer like breast cancer, colon or rectal cancer, pancreatic cancer, and stomach cancer. This medicine may be used for other purposes; ask your health care provider or pharmacist if you have questions. COMMON BRAND NAME(S): Adrucil What should I tell my care team before I take this medication? They need to know if you have any of these conditions: blood disorders dihydropyrimidine dehydrogenase (DPD) deficiency infection (especially a virus infection such as chickenpox, cold sores, or herpes) kidney disease liver disease malnourished, poor nutrition recent or ongoing radiation therapy an unusual or allergic reaction to fluorouracil, other chemotherapy, other medicines, foods, dyes, or preservatives  pregnant or trying to get pregnant breast-feeding How should I use this medication? This drug is given as an infusion or injection into a vein. It is administered in a hospital or clinic by a specially trained health care professional. Talk to your pediatrician regarding the use of this medicine in children. Special care may be needed. Overdosage: If you think you have taken too much of this medicine contact a poison control center or emergency room at  once. NOTE: This medicine is only for you. Do not share this medicine with others. What if I miss a dose? It is important not to miss your dose. Call your doctor or health care professional if you are unable to keep an appointment. What may interact with this medication? Do not take this medicine with any of the following medications: live virus vaccines This medicine may also interact with the following medications: medicines that treat or prevent blood clots like warfarin, enoxaparin, and dalteparin This list may not describe all possible interactions. Give your health care provider a list of all the medicines, herbs, non-prescription drugs, or dietary supplements you use. Also tell them if you smoke, drink alcohol, or use illegal drugs. Some items may interact with your medicine. What should I watch for while using this medication? Visit your doctor for checks on your progress. This drug may make you feel generally unwell. This is not uncommon, as chemotherapy can affect healthy cells as well as cancer cells. Report any side effects. Continue your course of treatment even though you feel ill unless your doctor tells you to stop. In some cases, you may be given additional medicines to help with side effects. Follow all directions for their use. Call your doctor or health care professional for advice if you get a fever, chills or sore throat, or other symptoms of a cold or flu. Do not treat yourself. This drug decreases your body's ability to fight infections. Try to avoid being around people who are sick. This medicine may increase your risk to bruise or bleed. Call your doctor or health care professional if you notice any unusual bleeding. Be careful brushing and flossing your teeth or using a toothpick because you may get an infection or bleed more easily. If you have any dental work done, tell your dentist you are receiving this medicine. Avoid taking products that contain aspirin,  acetaminophen, ibuprofen, naproxen, or ketoprofen unless instructed by your doctor. These medicines may hide a fever. Do not become pregnant while taking this medicine. Women should inform their doctor if they wish to become pregnant or think they might be pregnant. There is a potential for serious side effects to an unborn child. Talk to your health care professional or pharmacist for more information. Do not breast-feed an infant while taking this medicine. Men should inform their doctor if they wish to father a child. This medicine may lower sperm counts. Do not treat diarrhea with over the counter products. Contact your doctor if you have diarrhea that lasts more than 2 days or if it is severe and watery. This medicine can make you more sensitive to the sun. Keep out of the sun. If you cannot avoid being in the sun, wear protective clothing and use sunscreen. Do not use sun lamps or tanning beds/booths. What side effects may I notice from receiving this medication? Side effects that you should report to your doctor or health care professional as soon as possible: allergic reactions like skin rash, itching or hives, swelling of the  face, lips, or tongue low blood counts - this medicine may decrease the number of white blood cells, red blood cells and platelets. You may be at increased risk for infections and bleeding. signs of infection - fever or chills, cough, sore throat, pain or difficulty passing urine signs of decreased platelets or bleeding - bruising, pinpoint red spots on the skin, black, tarry stools, blood in the urine signs of decreased red blood cells - unusually weak or tired, fainting spells, lightheadedness breathing problems changes in vision chest pain mouth sores nausea and vomiting pain, swelling, redness at site where injected pain, tingling, numbness in the hands or feet redness, swelling, or sores on hands or feet stomach pain unusual bleeding Side effects that usually  do not require medical attention (report to your doctor or health care professional if they continue or are bothersome): changes in finger or toe nails diarrhea dry or itchy skin hair loss headache loss of appetite sensitivity of eyes to the light stomach upset unusually teary eyes This list may not describe all possible side effects. Call your doctor for medical advice about side effects. You may report side effects to FDA at 1-800-FDA-1088. Where should I keep my medication? This drug is given in a hospital or clinic and will not be stored at home. NOTE: This sheet is a summary. It may not cover all possible information. If you have questions about this medicine, talk to your doctor, pharmacist, or health care provider.  2022 Elsevier/Gold Standard (2021-05-29 00:00:00)  The chemotherapy medication bag should finish at 46 hours, 96 hours, or 7 days. For example, if your pump is scheduled for 46 hours and it was put on at 4:00 p.m., it should finish at 2:00 p.m. the day it is scheduled to come off regardless of your appointment time.     Estimated time to finish at 09:30 a.m. on Wednesday 10/10/2021.   If the display on your pump reads "Low Volume" and it is beeping, take the batteries out of the pump and come to the cancer center for it to be taken off.   If the pump alarms go off prior to the pump reading "Low Volume" then call [phone] and someone can assist you.  If the plunger comes out and the chemotherapy medication is leaking out, please use your home chemo spill kit to clean up the spill. Do NOT use paper towels or other household products.  If you have problems or questions regarding your pump, please call either [phone] (24 hours a day) or the cancer center Monday-Friday 8:00 a.m.- 4:30 p.m. at the clinic number and we will assist you. If you are unable to get assistance, then go to the nearest Emergency Department and ask the staff to contact the IV team for  assistance.

## 2021-10-08 NOTE — Progress Notes (Signed)
Patient presents for treatment. RN assessment completed along with the following:  Labs/vitals reviewed - Yes, and within treatment parameters.   Weight within 10% of previous measurement - Yes Informed consent completed and reflects current therapy/intent - Yes, on date 06/12/2021             Provider progress note reviewed - Today's provider note is not yet available. I reviewed the most recent oncology provider progress note in chart dated 09/18/2021. Treatment/Antibody/Supportive plan reviewed - Yes, and there are no adjustments needed for today's treatment. S&H and other orders reviewed - Yes, and there are no additional orders identified. Previous treatment date reviewed - Yes, and the appropriate amount of time has elapsed between treatments. Clinic Hand Off Received from - Yes from [facility], RN  Patient to proceed with treatment.

## 2021-10-10 ENCOUNTER — Inpatient Hospital Stay: Payer: Medicare Other

## 2021-10-10 ENCOUNTER — Other Ambulatory Visit: Payer: Self-pay

## 2021-10-10 VITALS — BP 145/65 | HR 58 | Temp 97.9°F | Resp 18

## 2021-10-10 DIAGNOSIS — C20 Malignant neoplasm of rectum: Secondary | ICD-10-CM

## 2021-10-10 DIAGNOSIS — Z5111 Encounter for antineoplastic chemotherapy: Secondary | ICD-10-CM | POA: Diagnosis not present

## 2021-10-10 MED ORDER — HEPARIN SOD (PORK) LOCK FLUSH 100 UNIT/ML IV SOLN
500.0000 [IU] | Freq: Once | INTRAVENOUS | Status: AC | PRN
  Administered 2021-10-10: 500 [IU]

## 2021-10-10 MED ORDER — SODIUM CHLORIDE 0.9% FLUSH
10.0000 mL | INTRAVENOUS | Status: DC | PRN
  Administered 2021-10-10: 10 mL

## 2021-10-10 NOTE — Patient Instructions (Signed)

## 2021-10-21 ENCOUNTER — Other Ambulatory Visit: Payer: Self-pay | Admitting: Oncology

## 2021-10-23 ENCOUNTER — Other Ambulatory Visit: Payer: Self-pay

## 2021-10-23 ENCOUNTER — Encounter: Payer: Self-pay | Admitting: Nurse Practitioner

## 2021-10-23 ENCOUNTER — Inpatient Hospital Stay: Payer: Medicare Other

## 2021-10-23 ENCOUNTER — Encounter: Payer: Self-pay | Admitting: *Deleted

## 2021-10-23 ENCOUNTER — Inpatient Hospital Stay (HOSPITAL_BASED_OUTPATIENT_CLINIC_OR_DEPARTMENT_OTHER): Payer: Medicare Other | Admitting: Nurse Practitioner

## 2021-10-23 VITALS — BP 146/70 | HR 60 | Temp 98.7°F | Resp 20 | Ht 69.0 in | Wt 168.2 lb

## 2021-10-23 DIAGNOSIS — C20 Malignant neoplasm of rectum: Secondary | ICD-10-CM | POA: Diagnosis not present

## 2021-10-23 DIAGNOSIS — Z5111 Encounter for antineoplastic chemotherapy: Secondary | ICD-10-CM | POA: Diagnosis not present

## 2021-10-23 LAB — CBC WITH DIFFERENTIAL (CANCER CENTER ONLY)
Abs Immature Granulocytes: 0.01 10*3/uL (ref 0.00–0.07)
Basophils Absolute: 0 10*3/uL (ref 0.0–0.1)
Basophils Relative: 1 %
Eosinophils Absolute: 0.2 10*3/uL (ref 0.0–0.5)
Eosinophils Relative: 5 %
HCT: 35.9 % — ABNORMAL LOW (ref 39.0–52.0)
Hemoglobin: 12.5 g/dL — ABNORMAL LOW (ref 13.0–17.0)
Immature Granulocytes: 0 %
Lymphocytes Relative: 16 %
Lymphs Abs: 0.6 10*3/uL — ABNORMAL LOW (ref 0.7–4.0)
MCH: 31 pg (ref 26.0–34.0)
MCHC: 34.8 g/dL (ref 30.0–36.0)
MCV: 89.1 fL (ref 80.0–100.0)
Monocytes Absolute: 0.3 10*3/uL (ref 0.1–1.0)
Monocytes Relative: 10 %
Neutro Abs: 2.4 10*3/uL (ref 1.7–7.7)
Neutrophils Relative %: 68 %
Platelet Count: 158 10*3/uL (ref 150–400)
RBC: 4.03 MIL/uL — ABNORMAL LOW (ref 4.22–5.81)
RDW: 14.6 % (ref 11.5–15.5)
WBC Count: 3.5 10*3/uL — ABNORMAL LOW (ref 4.0–10.5)
nRBC: 0 % (ref 0.0–0.2)

## 2021-10-23 LAB — CMP (CANCER CENTER ONLY)
ALT: 11 U/L (ref 0–44)
AST: 15 U/L (ref 15–41)
Albumin: 4 g/dL (ref 3.5–5.0)
Alkaline Phosphatase: 72 U/L (ref 38–126)
Anion gap: 9 (ref 5–15)
BUN: 19 mg/dL (ref 8–23)
CO2: 24 mmol/L (ref 22–32)
Calcium: 9.5 mg/dL (ref 8.9–10.3)
Chloride: 107 mmol/L (ref 98–111)
Creatinine: 0.94 mg/dL (ref 0.61–1.24)
GFR, Estimated: >60 mL/min (ref >60)
Glucose, Bld: 107 mg/dL — ABNORMAL HIGH (ref 70–99)
Potassium: 4.1 mmol/L (ref 3.5–5.1)
Sodium: 140 mmol/L (ref 135–145)
Total Bilirubin: 0.9 mg/dL (ref 0.3–1.2)
Total Protein: 6.2 g/dL — ABNORMAL LOW (ref 6.5–8.1)

## 2021-10-23 LAB — CEA (ACCESS): CEA (CHCC): 3.05 ng/mL (ref 0.00–5.00)

## 2021-10-23 MED ORDER — DEXTROSE 5 % IV SOLN: Freq: Once | INTRAVENOUS | Status: AC

## 2021-10-23 MED ORDER — SODIUM CHLORIDE 0.9% FLUSH: 10.0000 mL | INTRAVENOUS | Status: DC | PRN

## 2021-10-23 MED ORDER — SODIUM CHLORIDE 0.9 % IV SOLN
400.0000 mg/m2 | Freq: Once | INTRAVENOUS | Status: AC
  Administered 2021-10-23: 764 mg via INTRAVENOUS
  Filled 2021-10-23: qty 38.2

## 2021-10-23 MED ORDER — FLUOROURACIL CHEMO INJECTION 2.5 GM/50ML
400.0000 mg/m2 | Freq: Once | INTRAVENOUS | Status: AC
  Administered 2021-10-23: 750 mg via INTRAVENOUS
  Filled 2021-10-23: qty 15

## 2021-10-23 MED ORDER — SODIUM CHLORIDE 0.9 % IV SOLN
2000.0000 mg/m2 | INTRAVENOUS | Status: DC
  Administered 2021-10-23: 3800 mg via INTRAVENOUS
  Filled 2021-10-23: qty 76

## 2021-10-23 NOTE — Progress Notes (Signed)
Patient seen by Lisa Thomas NP today  Vitals are within treatment parameters.  Labs reviewed by Lisa Thomas NP and are within treatment parameters.  Per physician team, patient is ready for treatment and there are NO modifications to the treatment plan.     

## 2021-10-23 NOTE — Progress Notes (Signed)
Patient presents for treatment. RN assessment completed along with the following:  Labs/vitals reviewed - Yes, and within treatment parameters.   Weight within 10% of previous measurement - Yes Informed consent completed and reflects current therapy/intent - Yes, on date 06/12/2021             Provider progress note reviewed - Yes, today's provider note was reviewed. Treatment/Antibody/Supportive plan reviewed - Yes, and there are no adjustments needed for today's treatment. S&H and other orders reviewed - Yes, and there are no additional orders identified. Previous treatment date reviewed - Yes, and the appropriate amount of time has elapsed between treatments. Clinic Hand Off Received from - [facility], [place].    Patient to proceed with treatment.    Patient tolerated therapy with no complaints voiced.  Side effects with management reviewed with understanding verbalized.  Port site clean and dry with no bruising or swelling noted at site.  Good blood return noted before and after administration of therapy.  Dressing intact with the word "run" noted and a decrease in volume noted on pump.  Patient left in satisfactory condition with VSS and no s/s of distress noted.

## 2021-10-23 NOTE — Patient Instructions (Signed)
[facility]  Discharge Instructions: Thank you for choosing [facility] to provide your oncology and hematology care.   If you have a lab appointment with the [facility], please go directly to the [facility] and check in at the registration area.   Wear comfortable clothing and clothing appropriate for easy access to any Portacath or PICC line.   We strive to give you quality time with your provider. You may need to reschedule your appointment if you arrive late (15 or more minutes).  Arriving late affects you and other patients whose appointments are after yours.  Also, if you miss three or more appointments without notifying the office, you may be dismissed from the clinic at the providers discretion.      For prescription refill requests, have your pharmacy contact our office and allow 72 hours for refills to be completed.    Today you received the following chemotherapy and/or immunotherapy agents leucovorin and Adruicil with pump.    To help prevent nausea and vomiting after your treatment, we encourage you to take your nausea medication as directed.  BELOW ARE SYMPTOMS THAT SHOULD BE REPORTED IMMEDIATELY: *FEVER GREATER THAN 100.4 F (38 C) OR HIGHER *CHILLS OR SWEATING *NAUSEA AND VOMITING THAT IS NOT CONTROLLED WITH YOUR NAUSEA MEDICATION *UNUSUAL SHORTNESS OF BREATH *UNUSUAL BRUISING OR BLEEDING *URINARY PROBLEMS (pain or burning when urinating, or frequent urination) *BOWEL PROBLEMS (unusual diarrhea, constipation, pain near the anus) TENDERNESS IN MOUTH AND THROAT WITH OR WITHOUT PRESENCE OF ULCERS (sore throat, sores in mouth, or a toothache) UNUSUAL RASH, SWELLING OR PAIN  UNUSUAL VAGINAL DISCHARGE OR ITCHING   Items with * indicate a potential emergency and should be followed up as soon as possible or go to the Emergency Department if any problems should occur.  Please show the CHEMOTHERAPY ALERT CARD or IMMUNOTHERAPY ALERT  CARD at check-in to the Emergency Department and triage nurse.  Should you have questions after your visit or need to cancel or reschedule your appointment, please contact [facility]  Dept: [phone]  and follow the prompts.  Office hours are 8:00 a.m. to 4:30 p.m. Monday - Friday. Please note that voicemails left after 4:00 p.m. may not be returned until the following business day.  We are closed weekends and major holidays. You have access to a nurse at all times for urgent questions. Please call the main number to the clinic Dept: [phone] and follow the prompts.   For any non-urgent questions, you may also contact your provider using MyChart. We now offer e-Visits for anyone 32 and older to request care online for non-urgent symptoms. For details visit [email or web address].   Also download the MyChart app! Go to the app store, search "MyChart", open the app, select [facility], and log in with your MyChart username and password.  Due to Covid, a mask is required upon entering the hospital/clinic. If you do not have a mask, one will be given to you upon arrival. For doctor visits, patients may have 1 support person aged 56 or older with them. For treatment visits, patients cannot have anyone with them due to current Covid guidelines and our immunocompromised population.

## 2021-10-23 NOTE — Progress Notes (Signed)
[facility] OFFICE PROGRESS NOTE   Diagnosis: Rectal cancer  INTERVAL HISTORY:   Mr. Aguas returns as scheduled.  He completed another cycle of 5-fluorouracil 10/08/2021.  He denies nausea/vomiting.  No mouth sores.  No diarrhea.  He had constipation last week.  The constipation resolved with a laxative.  He continues to have intermittent "shaking" episodes when he stands.  This does not occur daily.  He notes a small amount of blood with "hard" nose blowing.  Objective:  Vital signs in last 24 hours:  Blood pressure (!) 146/70, pulse 60, temperature 98.7 F (37.1 C), temperature source Oral, resp. rate 20, height 5' 9"  (1.753 m), weight 168 lb 3.2 oz (76.3 kg), SpO2 100 %.    HEENT: No thrush or ulcers.  No obvious ulcers or bleeding at the lower nasal passages. Resp: Lungs clear bilaterally. Cardio: Regular rate and rhythm. GI: Abdomen soft and nontender.  No hepatomegaly. Vascular: No leg edema. Skin: Palms without erythema. Port-A-Cath without erythema.   Lab Results:  Lab Results  Component Value Date   WBC 3.5 (L) 10/23/2021   HGB 12.5 (L) 10/23/2021   HCT 35.9 (L) 10/23/2021   MCV 89.1 10/23/2021   PLT 158 10/23/2021   NEUTROABS 2.4 10/23/2021    Imaging:  No results found.  Medications: I have reviewed the patient's current medications.  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; foundation 1-microsatellite stable, tumor mutation burden 5, K-rasG 12V, NRAS wildtype Colonoscopy 05/22/2021-ulcerated partially obstructing mass at 15 cm from anal verge CTs 05/30/2021-numerous small pulmonary nodules concerning for metastases,  thickening of the rectum, multiple liver metastases Cycle 1 FOLFOX 06/12/2021 Cycle 2 FOLFOX 06/26/2021 Cycle 3 FOLFOX 07/10/2021, oxaliplatin dose reduced due to progressive decline in the [place] and platelet count Cycle 4 FOLFOX 07/24/2021 Cycle 5 FOLFOX 08/07/2021, 5-FU bolus eliminated and oxaliplatin dose reduced, insurance would not approve Udenyca CTs 08/15/2021-decrease size of lung nodules, decreased hepatic metastases, persistent anorectal wall thickening, stable right renal mass Cycle 6 FOLFOX 08/21/2021, 5-FU bolus and 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 Cycle 7 FOLFOX 09/04/2021, 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 Cycle 8 FOLFOX 09/18/2021, 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 Cycle 9 FOLFOX 10/08/2021, 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 Cycle 10 FOLFOX 10/23/2021, 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  Mitral valve prolapse 5.  Family history of prostate cancer 6.  08/15/2021 with leg weakness and an episode of slurred speech-TIA?,  Brain imaging negative   Disposition: Mr. RPletzappears stable.  He has completed 9 cycles of FOLFOX.  Oxaliplatin has been on hold secondary to neuropathy symptoms.  Plan to  proceed with treatment today as  scheduled, continue to hold oxaliplatin.  Restaging CTs prior to next office visit.  CBC and chemistry panel reviewed.  Labs adequate to proceed as above.  He will return for follow-up in 2 weeks.  He will contact the office in the interim with any problems.    Ned Card ANP/GNP-BC   10/23/2021  10:02 AM

## 2021-10-25 ENCOUNTER — Inpatient Hospital Stay: Payer: Medicare Other | Attending: Nurse Practitioner

## 2021-10-25 ENCOUNTER — Other Ambulatory Visit: Payer: Self-pay

## 2021-10-25 VITALS — BP 138/71 | HR 60 | Temp 98.5°F | Resp 18

## 2021-10-25 DIAGNOSIS — C20 Malignant neoplasm of rectum: Secondary | ICD-10-CM | POA: Insufficient documentation

## 2021-10-25 DIAGNOSIS — Z8042 Family history of malignant neoplasm of prostate: Secondary | ICD-10-CM | POA: Diagnosis not present

## 2021-10-25 DIAGNOSIS — K5900 Constipation, unspecified: Secondary | ICD-10-CM | POA: Insufficient documentation

## 2021-10-25 DIAGNOSIS — Z5111 Encounter for antineoplastic chemotherapy: Secondary | ICD-10-CM | POA: Diagnosis present

## 2021-10-25 DIAGNOSIS — Z923 Personal history of irradiation: Secondary | ICD-10-CM | POA: Insufficient documentation

## 2021-10-25 DIAGNOSIS — I341 Nonrheumatic mitral (valve) prolapse: Secondary | ICD-10-CM | POA: Diagnosis not present

## 2021-10-25 DIAGNOSIS — C787 Secondary malignant neoplasm of liver and intrahepatic bile duct: Secondary | ICD-10-CM | POA: Diagnosis not present

## 2021-10-25 DIAGNOSIS — G629 Polyneuropathy, unspecified: Secondary | ICD-10-CM | POA: Insufficient documentation

## 2021-10-25 DIAGNOSIS — K644 Residual hemorrhoidal skin tags: Secondary | ICD-10-CM | POA: Insufficient documentation

## 2021-10-25 DIAGNOSIS — R918 Other nonspecific abnormal finding of lung field: Secondary | ICD-10-CM | POA: Insufficient documentation

## 2021-10-25 DIAGNOSIS — C61 Malignant neoplasm of prostate: Secondary | ICD-10-CM | POA: Diagnosis not present

## 2021-10-25 MED ORDER — SODIUM CHLORIDE 0.9% FLUSH
10.0000 mL | INTRAVENOUS | Status: DC | PRN
  Administered 2021-10-25: 10 mL

## 2021-10-25 MED ORDER — HEPARIN SOD (PORK) LOCK FLUSH 100 UNIT/ML IV SOLN
500.0000 [IU] | Freq: Once | INTRAVENOUS | Status: AC | PRN
  Administered 2021-10-25: 500 [IU]

## 2021-11-01 ENCOUNTER — Inpatient Hospital Stay: Payer: Medicare Other

## 2021-11-01 ENCOUNTER — Other Ambulatory Visit: Payer: Self-pay

## 2021-11-01 ENCOUNTER — Ambulatory Visit (HOSPITAL_BASED_OUTPATIENT_CLINIC_OR_DEPARTMENT_OTHER)
Admission: RE | Admit: 2021-11-01 | Discharge: 2021-11-01 | Disposition: A | Discharge status: Home / Self Care | Payer: Medicare Other | Source: Ambulatory Visit | Attending: Nurse Practitioner | Admitting: Nurse Practitioner

## 2021-11-01 DIAGNOSIS — C20 Malignant neoplasm of rectum: Secondary | ICD-10-CM | POA: Diagnosis present

## 2021-11-01 MED ORDER — IOHEXOL 300 MG/ML  SOLN
85.0000 mL | Freq: Once | INTRAMUSCULAR | Status: AC | PRN
  Administered 2021-11-01: 85 mL via INTRAVENOUS

## 2021-11-01 MED ORDER — HEPARIN SOD (PORK) LOCK FLUSH 100 UNIT/ML IV SOLN
500.0000 [IU] | Freq: Once | INTRAVENOUS | Status: AC
  Administered 2021-11-01: 500 [IU] via INTRAVENOUS

## 2021-11-04 ENCOUNTER — Other Ambulatory Visit: Payer: Self-pay | Admitting: Oncology

## 2021-11-06 ENCOUNTER — Encounter: Payer: Self-pay | Admitting: Licensed Clinical Social Worker

## 2021-11-06 ENCOUNTER — Other Ambulatory Visit: Payer: Self-pay

## 2021-11-06 ENCOUNTER — Inpatient Hospital Stay (HOSPITAL_BASED_OUTPATIENT_CLINIC_OR_DEPARTMENT_OTHER): Payer: Medicare Other | Admitting: Nurse Practitioner

## 2021-11-06 ENCOUNTER — Inpatient Hospital Stay: Payer: Medicare Other

## 2021-11-06 ENCOUNTER — Encounter: Payer: Self-pay | Admitting: Nurse Practitioner

## 2021-11-06 ENCOUNTER — Other Ambulatory Visit: Payer: Self-pay | Admitting: *Deleted

## 2021-11-06 VITALS — BP 186/82 | HR 50 | Temp 98.4°F | Resp 18

## 2021-11-06 VITALS — BP 166/82 | HR 76 | Temp 98.1°F | Resp 20 | Ht 69.0 in | Wt 171.0 lb

## 2021-11-06 DIAGNOSIS — C20 Malignant neoplasm of rectum: Secondary | ICD-10-CM | POA: Diagnosis not present

## 2021-11-06 DIAGNOSIS — Z5111 Encounter for antineoplastic chemotherapy: Secondary | ICD-10-CM | POA: Diagnosis not present

## 2021-11-06 LAB — CMP (CANCER CENTER ONLY)
ALT: 10 U/L (ref 0–44)
AST: 14 U/L — ABNORMAL LOW (ref 15–41)
Albumin: 3.9 g/dL (ref 3.5–5.0)
Alkaline Phosphatase: 69 U/L (ref 38–126)
Anion gap: 8 (ref 5–15)
BUN: 17 mg/dL (ref 8–23)
CO2: 27 mmol/L (ref 22–32)
Calcium: 9.5 mg/dL (ref 8.9–10.3)
Chloride: 106 mmol/L (ref 98–111)
Creatinine: 0.86 mg/dL (ref 0.61–1.24)
GFR, Estimated: >60 mL/min (ref >60)
Glucose, Bld: 103 mg/dL — ABNORMAL HIGH (ref 70–99)
Potassium: 3.7 mmol/L (ref 3.5–5.1)
Sodium: 141 mmol/L (ref 135–145)
Total Bilirubin: 0.8 mg/dL (ref 0.3–1.2)
Total Protein: 5.9 g/dL — ABNORMAL LOW (ref 6.5–8.1)

## 2021-11-06 LAB — CBC WITH DIFFERENTIAL (CANCER CENTER ONLY)
Abs Immature Granulocytes: 0.01 10*3/uL (ref 0.00–0.07)
Basophils Absolute: 0 10*3/uL (ref 0.0–0.1)
Basophils Relative: 1 %
Eosinophils Absolute: 0.2 10*3/uL (ref 0.0–0.5)
Eosinophils Relative: 6 %
HCT: 34.6 % — ABNORMAL LOW (ref 39.0–52.0)
Hemoglobin: 12.3 g/dL — ABNORMAL LOW (ref 13.0–17.0)
Immature Granulocytes: 0 %
Lymphocytes Relative: 18 %
Lymphs Abs: 0.7 10*3/uL (ref 0.7–4.0)
MCH: 31.4 pg (ref 26.0–34.0)
MCHC: 35.5 g/dL (ref 30.0–36.0)
MCV: 88.3 fL (ref 80.0–100.0)
Monocytes Absolute: 0.3 10*3/uL (ref 0.1–1.0)
Monocytes Relative: 9 %
Neutro Abs: 2.4 10*3/uL (ref 1.7–7.7)
Neutrophils Relative %: 66 %
Platelet Count: 147 10*3/uL — ABNORMAL LOW (ref 150–400)
RBC: 3.92 MIL/uL — ABNORMAL LOW (ref 4.22–5.81)
RDW: 15 % (ref 11.5–15.5)
WBC Count: 3.6 10*3/uL — ABNORMAL LOW (ref 4.0–10.5)
nRBC: 0 % (ref 0.0–0.2)

## 2021-11-06 LAB — CEA (ACCESS): CEA (CHCC): 2.71 ng/mL (ref 0.00–5.00)

## 2021-11-06 MED ORDER — SODIUM CHLORIDE 0.9 % IV SOLN
400.0000 mg/m2 | Freq: Once | INTRAVENOUS | Status: AC
  Administered 2021-11-06: 764 mg via INTRAVENOUS
  Filled 2021-11-06: qty 38.2

## 2021-11-06 MED ORDER — SODIUM CHLORIDE 0.9 % IV SOLN: INTRAVENOUS | Status: DC

## 2021-11-06 MED ORDER — FLUOROURACIL CHEMO INJECTION 2.5 GM/50ML
400.0000 mg/m2 | Freq: Once | INTRAVENOUS | Status: AC
  Administered 2021-11-06: 750 mg via INTRAVENOUS
  Filled 2021-11-06: qty 15

## 2021-11-06 MED ORDER — SODIUM CHLORIDE 0.9 % IV SOLN
2000.0000 mg/m2 | INTRAVENOUS | Status: DC
  Administered 2021-11-06: 3800 mg via INTRAVENOUS
  Filled 2021-11-06: qty 76

## 2021-11-06 NOTE — Patient Instructions (Signed)
[facility]   Discharge Instructions: Thank you for choosing [facility] to provide your oncology and hematology care.   If you have a lab appointment with the [facility], please go directly to the [facility] and check in at the registration area.   Wear comfortable clothing and clothing appropriate for easy access to any Portacath or PICC line.   We strive to give you quality time with your provider. You may need to reschedule your appointment if you arrive late (15 or more minutes).  Arriving late affects you and other patients whose appointments are after yours.  Also, if you miss three or more appointments without notifying the office, you may be dismissed from the clinic at the providers discretion.      For prescription refill requests, have your pharmacy contact our office and allow 72 hours for refills to be completed.    Today you received the following chemotherapy and/or immunotherapy agents Leucovorin & Flourouracil (ADRUCIL).      To help prevent nausea and vomiting after your treatment, we encourage you to take your nausea medication as directed.  BELOW ARE SYMPTOMS THAT SHOULD BE REPORTED IMMEDIATELY: *FEVER GREATER THAN 100.4 F (38 C) OR HIGHER *CHILLS OR SWEATING *NAUSEA AND VOMITING THAT IS NOT CONTROLLED WITH YOUR NAUSEA MEDICATION *UNUSUAL SHORTNESS OF BREATH *UNUSUAL BRUISING OR BLEEDING *URINARY PROBLEMS (pain or burning when urinating, or frequent urination) *BOWEL PROBLEMS (unusual diarrhea, constipation, pain near the anus) TENDERNESS IN MOUTH AND THROAT WITH OR WITHOUT PRESENCE OF ULCERS (sore throat, sores in mouth, or a toothache) UNUSUAL RASH, SWELLING OR PAIN  UNUSUAL VAGINAL DISCHARGE OR ITCHING   Items with * indicate a potential emergency and should be followed up as soon as possible or go to the Emergency Department if any problems should occur.  Please show the CHEMOTHERAPY ALERT CARD or IMMUNOTHERAPY  ALERT CARD at check-in to the Emergency Department and triage nurse.  Should you have questions after your visit or need to cancel or reschedule your appointment, please contact [facility]  Dept: [phone]  and follow the prompts.  Office hours are 8:00 a.m. to 4:30 p.m. Monday - Friday. Please note that voicemails left after 4:00 p.m. may not be returned until the following business day.  We are closed weekends and major holidays. You have access to a nurse at all times for urgent questions. Please call the main number to the clinic Dept: [phone] and follow the prompts.   For any non-urgent questions, you may also contact your provider using MyChart. We now offer e-Visits for anyone 21 and older to request care online for non-urgent symptoms. For details visit [email or web address].   Also download the MyChart app! Go to the app store, search "MyChart", open the app, select [facility], and log in with your MyChart username and password.  Due to Covid, a mask is required upon entering the hospital/clinic. If you do not have a mask, one will be given to you upon arrival. For doctor visits, patients may have 1 support person aged 44 or older with them. For treatment visits, patients cannot have anyone with them due to current Covid guidelines and our immunocompromised population.   Leucovorin injection What is this medication? LEUCOVORIN (loo koe VOR in) is used to prevent or treat the harmful effects of some medicines. This medicine is used to treat anemia caused by a low amount of folic acid in the body. It is also used  with 5-fluorouracil (5-FU) to treat colon cancer. This medicine may be used for other purposes; ask your health care provider or pharmacist if you have questions. What should I tell my care team before I take this medication? They need to know if you have any of these conditions: anemia from low levels of vitamin B-12 in the blood an unusual or  allergic reaction to leucovorin, folic acid, other medicines, foods, dyes, or preservatives pregnant or trying to get pregnant breast-feeding How should I use this medication? This medicine is for injection into a muscle or into a vein. It is given by a health care professional in a hospital or clinic setting. Talk to your pediatrician regarding the use of this medicine in children. Special care may be needed. Overdosage: If you think you have taken too much of this medicine contact a poison control center or emergency room at once. NOTE: This medicine is only for you. Do not share this medicine with others. What if I miss a dose? This does not apply. What may interact with this medication? capecitabine fluorouracil phenobarbital phenytoin primidone trimethoprim-sulfamethoxazole This list may not describe all possible interactions. Give your health care provider a list of all the medicines, herbs, non-prescription drugs, or dietary supplements you use. Also tell them if you smoke, drink alcohol, or use illegal drugs. Some items may interact with your medicine. What should I watch for while using this medication? Your condition will be monitored carefully while you are receiving this medicine. This medicine may increase the side effects of 5-fluorouracil, 5-FU. Tell your doctor or health care professional if you have diarrhea or mouth sores that do not get better or that get worse. What side effects may I notice from receiving this medication? Side effects that you should report to your doctor or health care professional as soon as possible: allergic reactions like skin rash, itching or hives, swelling of the face, lips, or tongue breathing problems fever, infection mouth sores unusual bleeding or bruising unusually weak or tired Side effects that usually do not require medical attention (report to your doctor or health care professional if they continue or are bothersome): constipation  or diarrhea loss of appetite nausea, vomiting This list may not describe all possible side effects. Call your doctor for medical advice about side effects. You may report side effects to FDA at 1-800-FDA-1088. Where should I keep my medication? This drug is given in a hospital or clinic and will not be stored at home. NOTE: This sheet is a summary. It may not cover all possible information. If you have questions about this medicine, talk to your doctor, pharmacist, or health care provider.  2022 Elsevier/Gold Standard (2008-03-17 00:00:00)  Fluorouracil, 5-FU injection What is this medication? FLUOROURACIL, 5-FU (flure oh YOOR a sil) is a chemotherapy drug. It slows the growth of cancer cells. This medicine is used to treat many types of cancer like breast cancer, colon or rectal cancer, pancreatic cancer, and stomach cancer. This medicine may be used for other purposes; ask your health care provider or pharmacist if you have questions. COMMON BRAND NAME(S): Adrucil What should I tell my care team before I take this medication? They need to know if you have any of these conditions: blood disorders dihydropyrimidine dehydrogenase (DPD) deficiency infection (especially a virus infection such as chickenpox, cold sores, or herpes) kidney disease liver disease malnourished, poor nutrition recent or ongoing radiation therapy an unusual or allergic reaction to fluorouracil, other chemotherapy, other medicines, foods, dyes, or preservatives  pregnant or trying to get pregnant breast-feeding How should I use this medication? This drug is given as an infusion or injection into a vein. It is administered in a hospital or clinic by a specially trained health care professional. Talk to your pediatrician regarding the use of this medicine in children. Special care may be needed. Overdosage: If you think you have taken too much of this medicine contact a poison control center or emergency room at  once. NOTE: This medicine is only for you. Do not share this medicine with others. What if I miss a dose? It is important not to miss your dose. Call your doctor or health care professional if you are unable to keep an appointment. What may interact with this medication? Do not take this medicine with any of the following medications: live virus vaccines This medicine may also interact with the following medications: medicines that treat or prevent blood clots like warfarin, enoxaparin, and dalteparin This list may not describe all possible interactions. Give your health care provider a list of all the medicines, herbs, non-prescription drugs, or dietary supplements you use. Also tell them if you smoke, drink alcohol, or use illegal drugs. Some items may interact with your medicine. What should I watch for while using this medication? Visit your doctor for checks on your progress. This drug may make you feel generally unwell. This is not uncommon, as chemotherapy can affect healthy cells as well as cancer cells. Report any side effects. Continue your course of treatment even though you feel ill unless your doctor tells you to stop. In some cases, you may be given additional medicines to help with side effects. Follow all directions for their use. Call your doctor or health care professional for advice if you get a fever, chills or sore throat, or other symptoms of a cold or flu. Do not treat yourself. This drug decreases your body's ability to fight infections. Try to avoid being around people who are sick. This medicine may increase your risk to bruise or bleed. Call your doctor or health care professional if you notice any unusual bleeding. Be careful brushing and flossing your teeth or using a toothpick because you may get an infection or bleed more easily. If you have any dental work done, tell your dentist you are receiving this medicine. Avoid taking products that contain aspirin,  acetaminophen, ibuprofen, naproxen, or ketoprofen unless instructed by your doctor. These medicines may hide a fever. Do not become pregnant while taking this medicine. Women should inform their doctor if they wish to become pregnant or think they might be pregnant. There is a potential for serious side effects to an unborn child. Talk to your health care professional or pharmacist for more information. Do not breast-feed an infant while taking this medicine. Men should inform their doctor if they wish to father a child. This medicine may lower sperm counts. Do not treat diarrhea with over the counter products. Contact your doctor if you have diarrhea that lasts more than 2 days or if it is severe and watery. This medicine can make you more sensitive to the sun. Keep out of the sun. If you cannot avoid being in the sun, wear protective clothing and use sunscreen. Do not use sun lamps or tanning beds/booths. What side effects may I notice from receiving this medication? Side effects that you should report to your doctor or health care professional as soon as possible: allergic reactions like skin rash, itching or hives, swelling of the  face, lips, or tongue low blood counts - this medicine may decrease the number of white blood cells, red blood cells and platelets. You may be at increased risk for infections and bleeding. signs of infection - fever or chills, cough, sore throat, pain or difficulty passing urine signs of decreased platelets or bleeding - bruising, pinpoint red spots on the skin, black, tarry stools, blood in the urine signs of decreased red blood cells - unusually weak or tired, fainting spells, lightheadedness breathing problems changes in vision chest pain mouth sores nausea and vomiting pain, swelling, redness at site where injected pain, tingling, numbness in the hands or feet redness, swelling, or sores on hands or feet stomach pain unusual bleeding Side effects that usually  do not require medical attention (report to your doctor or health care professional if they continue or are bothersome): changes in finger or toe nails diarrhea dry or itchy skin hair loss headache loss of appetite sensitivity of eyes to the light stomach upset unusually teary eyes This list may not describe all possible side effects. Call your doctor for medical advice about side effects. You may report side effects to FDA at 1-800-FDA-1088. Where should I keep my medication? This drug is given in a hospital or clinic and will not be stored at home. NOTE: This sheet is a summary. It may not cover all possible information. If you have questions about this medicine, talk to your doctor, pharmacist, or health care provider.  2022 Elsevier/Gold Standard (2021-05-29 00:00:00)  The chemotherapy medication bag should finish at 46 hours, 96 hours, or 7 days. For example, if your pump is scheduled for 46 hours and it was put on at 4:00 p.m., it should finish at 2:00 p.m. the day it is scheduled to come off regardless of your appointment time.     Estimated time to finish at 10:00 a.m. on Thursday 11/08/2021.   If the display on your pump reads "Low Volume" and it is beeping, take the batteries out of the pump and come to the cancer center for it to be taken off.   If the pump alarms go off prior to the pump reading "Low Volume" then call [phone] and someone can assist you.  If the plunger comes out and the chemotherapy medication is leaking out, please use your home chemo spill kit to clean up the spill. Do NOT use paper towels or other household products.  If you have problems or questions regarding your pump, please call either [phone] (24 hours a day) or the cancer center Monday-Friday 8:00 a.m.- 4:30 p.m. at the clinic number and we will assist you. If you are unable to get assistance, then go to the nearest Emergency Department and ask the staff to contact the IV team for  assistance.

## 2021-11-06 NOTE — Progress Notes (Signed)
[facility] OFFICE PROGRESS NOTE   Diagnosis: Rectal cancer  INTERVAL HISTORY:   Mr. Babich returns as scheduled.  He completed another cycle of 5-fluorouracil 10/23/2021.  He denies nausea/vomiting.  No mouth sores.  No diarrhea.  No hand or foot pain or redness.  He continues to note "shakiness" with standing.  Objective:  Vital signs in last 24 hours:  Blood pressure (!) 166/82, pulse 76, temperature 98.1 F (36.7 C), temperature source Oral, resp. rate 20, height 5' 9"  (1.753 m), weight 171 lb (77.6 kg), SpO2 100 %.    HEENT: No thrush or ulcers. Resp: Lungs clear bilaterally. Cardio: Regular rate and rhythm. GI: Abdomen soft and nontender.  No hepatomegaly. Vascular: Trace lower leg edema bilaterally. Skin: Palms with mild hyperpigmentation.  No erythema.  No skin breakdown. Port-A-Cath without erythema   Lab Results:  Lab Results  Component Value Date   WBC 3.6 (L) 11/06/2021   HGB 12.3 (L) 11/06/2021   HCT 34.6 (L) 11/06/2021   MCV 88.3 11/06/2021   PLT 147 (L) 11/06/2021   NEUTROABS 2.4 11/06/2021    Imaging:  No results found.  Medications: I have reviewed the patient's current medications.  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; foundation 1-microsatellite stable, tumor mutation burden 5, K-rasG 12V, NRAS wildtype Colonoscopy 05/22/2021-ulcerated partially obstructing mass at 15 cm from anal verge CTs 05/30/2021-numerous small pulmonary nodules concerning for metastases, thickening of the rectum, multiple liver metastases Cycle 1 FOLFOX 06/12/2021 Cycle 2 FOLFOX 06/26/2021 Cycle 3 FOLFOX 07/10/2021, oxaliplatin dose  reduced due to progressive decline in the [place] and platelet count Cycle 4 FOLFOX 07/24/2021 Cycle 5 FOLFOX 08/07/2021, 5-FU bolus eliminated and oxaliplatin dose reduced, insurance would not approve Udenyca CTs 08/15/2021-decrease size of lung nodules, decreased hepatic metastases, persistent anorectal wall thickening, stable right renal mass Cycle 6 FOLFOX 08/21/2021, 5-FU bolus and 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 Cycle 7 FOLFOX 09/04/2021, 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 Cycle 8 FOLFOX 09/18/2021, 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 Cycle 9 FOLFOX 10/08/2021, 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 Cycle 10 FOLFOX 10/23/2021, 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 CTs 11/01/2021-decrease in size of occasional small pulmonary nodules, continued decrease in size of multiple hypoenhancing hepatic metastases, unchanged posttreatment appearance of the low rectum, unchanged exophytic mass of the inferior pole of the right kidney measuring 1.5 x 1.3 cm Cycle 11 5-fluorouracil 11/06/2021 Prostate cancer-simple prostatectomy January 2019, Gleason 3+3, 10 to 12% of specimen Active surveillance, biopsy May 2019 with small focus of Gleason 3+3 disease in 1/6 cores, surveillance continued Elevated PSA 04/20/2020 Biopsy 06/19/2020 8/12 cores positive, Gleason 4+4 PET scan 07/07/2020-negative CT 07/07/2020 right iliac and retrocaval adenopathy, 1.3 cm subcapsular right lower pole renal lesion Androgen deprivation therapy beginning 08/21/2020 Radiation to prostate, seminal vesicles, and pelvic lymph nodes to 10/22 - 12/28/2020 He continues every 65-monthFirmagon and daily Xtandi   3.  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  Mitral valve prolapse 5.  Family history of prostate cancer 6.  08/15/2021 with leg weakness and an episode of slurred speech-TIA?,  Brain  imaging negative  Disposition: Mr. RLacombappears stable.  He is currently on active treatment with 5-fluorouracil every  2 weeks.  Recent restaging CTs show continued improvement in lung and liver metastases.  Dr. [name] reviewed the CT results/images with Mr. Tabor at today's visit.  We discussed continuation of the current treatment with 5-fluorouracil every 2 weeks versus maintenance Xeloda versus treatment break.  He would like to continue 5-fluorouracil every 2 weeks.  CBC and chemistry panel reviewed.  Labs adequate to proceed with treatment today as scheduled.  He will return for lab, follow-up, 5-fluorouracil in 2 weeks.  He will contact the office in the interim with any problems.  Patient seen with Dr. [name].    Ned Card ANP/GNP-BC   11/06/2021  9:04 AM  This was a shared visit with Ned Card.  We discussed the restaging CT findings and reviewed the images with Mr. Fini.  The CTs are consistent with a continued response to therapy.  We discussed ongoing treatment options including a treatment break, continuing IV 5-fluorouracil, and changing to maintenance Xeloda.  He prefers to continue the current treatment.  I was present for greater than 50% of today's visit.  I performed medical decision making.  Julieanne Manson, MD

## 2021-11-06 NOTE — Progress Notes (Signed)
Patient seen by Ned Card NP and MD [name] today.   Vitals are within treatment parameters.  Labs reviewed by Ned Card NP and are within treatment parameters.  Per physician team, patient is ready for treatment and there are NO modifications to the treatment plan.

## 2021-11-06 NOTE — Progress Notes (Signed)
Referral for social work placed for follow

## 2021-11-06 NOTE — Progress Notes (Signed)
[facility] Work  Initial Assessment   Neil Perry is a 81 y.o. year old male seen in infusion . Clinical Social Work was referred by Engineer, site for assessment of psychosocial needs.   SDOH (Social Determinants of Health) assessments performed: Yes   Distress Screen completed: No ONCBCN DISTRESS SCREENING 05/23/2021  Screening Type Initial Screening  Distress experienced in past week (1-10) 4  Emotional problem type Nervousness/Anxiety  Information Concerns Type Lack of info about diagnosis  Physical Problem type Nausea/vomiting  Physician notified of physical symptoms Yes      Family/Social Information:  Housing Arrangement: patient lives with spouse  Neil Perry Family members/support persons in your life? Family and Friends/Colleagues Transportation concerns: no  Employment: Retired. Income source: Paediatric nurse concerns: No Type of concern: None Food access concerns: no Religious or spiritual practice: N/A Services Currently in place:  [employer]/Medicare  Coping/ Adjustment to diagnosis: Patient understands treatment plan and what happens next? yes Concerns about diagnosis and/or treatment:  Patient stated he had "good news today" and feels anxiety has lessened.   Patient reported stressors: Anxiety and Adjusting to my illness Hopes and priorities: To continue to treatment Patient enjoys being outside and time with family/ friends Current coping skills/ strengths: Ability for insight , Average or above average intelligence , Capable of independent living , Communication skills , Scientist, research (life sciences) , General fund of knowledge , Motivation for treatment/growth , Physical Health , and Supportive family/friends     SUMMARY: Current SDOH Barriers:  Feelings of anxiety  Clinical Social Work Clinical Goal(s):  patient will work with SW to address concerns related to anxiety, adjustment to illness.  Interventions: Discussed common feeling and  emotions when being diagnosed with cancer, and the importance of support during treatment Informed patient of the support team roles and support services at [facility] Provided CSW contact information and encouraged patient to call with any questions or concerns Provided patient with information about CSW role in patient care and resources.   Follow Up Plan: Patient will contact CSW with any support or resource needs. CSW will follow-up with patient. Patient verbalizes understanding of plan: Yes    [employer] , LCSW

## 2021-11-06 NOTE — Progress Notes (Signed)
Patient presents for treatment. RN assessment completed along with the following:  Labs/vitals reviewed - Yes, and within treatment parameters.   Weight within 10% of previous measurement - Yes Informed consent completed and reflects current therapy/intent - Yes, on date 06/12/2021             Provider progress note reviewed - Yes, today's provider note was reviewed. Treatment/Antibody/Supportive plan reviewed - Yes, and there are no adjustments needed for today's treatment. S&H and other orders reviewed - Yes, and there are no additional orders identified. Previous treatment date reviewed - Yes, and the appropriate amount of time has elapsed between treatments. Clinic Hand Off Received from - Yes from [facility]  Patient to proceed with treatment.

## 2021-11-07 ENCOUNTER — Other Ambulatory Visit: Payer: Self-pay | Admitting: *Deleted

## 2021-11-07 DIAGNOSIS — C20 Malignant neoplasm of rectum: Secondary | ICD-10-CM

## 2021-11-08 ENCOUNTER — Other Ambulatory Visit: Payer: Self-pay

## 2021-11-08 ENCOUNTER — Ambulatory Visit: Payer: Medicare Other | Admitting: Family Medicine

## 2021-11-08 ENCOUNTER — Inpatient Hospital Stay: Payer: Medicare Other

## 2021-11-08 VITALS — BP 175/81 | HR 57 | Temp 97.6°F | Resp 18

## 2021-11-08 DIAGNOSIS — Z5111 Encounter for antineoplastic chemotherapy: Secondary | ICD-10-CM | POA: Diagnosis not present

## 2021-11-08 DIAGNOSIS — C20 Malignant neoplasm of rectum: Secondary | ICD-10-CM

## 2021-11-08 MED ORDER — SODIUM CHLORIDE 0.9% FLUSH
10.0000 mL | INTRAVENOUS | Status: DC | PRN
  Administered 2021-11-08: 10 mL

## 2021-11-08 MED ORDER — HEPARIN SOD (PORK) LOCK FLUSH 100 UNIT/ML IV SOLN
500.0000 [IU] | Freq: Once | INTRAVENOUS | Status: AC | PRN
  Administered 2021-11-08: 500 [IU]

## 2021-11-18 ENCOUNTER — Other Ambulatory Visit: Payer: Self-pay | Admitting: Oncology

## 2021-11-20 ENCOUNTER — Other Ambulatory Visit: Payer: Medicare Other

## 2021-11-20 ENCOUNTER — Inpatient Hospital Stay: Payer: Medicare Other

## 2021-11-20 ENCOUNTER — Encounter: Payer: Self-pay | Admitting: *Deleted

## 2021-11-20 ENCOUNTER — Ambulatory Visit: Payer: Medicare Other

## 2021-11-20 ENCOUNTER — Ambulatory Visit: Payer: Medicare Other | Admitting: Oncology

## 2021-11-20 ENCOUNTER — Other Ambulatory Visit: Payer: Self-pay

## 2021-11-20 ENCOUNTER — Inpatient Hospital Stay (HOSPITAL_BASED_OUTPATIENT_CLINIC_OR_DEPARTMENT_OTHER): Payer: Medicare Other | Admitting: Oncology

## 2021-11-20 VITALS — BP 158/81 | HR 66 | Temp 97.8°F | Resp 20 | Ht 69.0 in | Wt 168.9 lb

## 2021-11-20 DIAGNOSIS — C20 Malignant neoplasm of rectum: Secondary | ICD-10-CM

## 2021-11-20 DIAGNOSIS — Z5111 Encounter for antineoplastic chemotherapy: Secondary | ICD-10-CM | POA: Diagnosis not present

## 2021-11-20 LAB — CBC WITH DIFFERENTIAL (CANCER CENTER ONLY)
Abs Immature Granulocytes: 0.01 10*3/uL (ref 0.00–0.07)
Basophils Absolute: 0 10*3/uL (ref 0.0–0.1)
Basophils Relative: 0 %
Eosinophils Absolute: 0.2 10*3/uL (ref 0.0–0.5)
Eosinophils Relative: 7 %
HCT: 35.1 % — ABNORMAL LOW (ref 39.0–52.0)
Hemoglobin: 12.5 g/dL — ABNORMAL LOW (ref 13.0–17.0)
Immature Granulocytes: 0 %
Lymphocytes Relative: 19 %
Lymphs Abs: 0.6 10*3/uL — ABNORMAL LOW (ref 0.7–4.0)
MCH: 31.3 pg (ref 26.0–34.0)
MCHC: 35.6 g/dL (ref 30.0–36.0)
MCV: 87.8 fL (ref 80.0–100.0)
Monocytes Absolute: 0.4 10*3/uL (ref 0.1–1.0)
Monocytes Relative: 12 %
Neutro Abs: 2.1 10*3/uL (ref 1.7–7.7)
Neutrophils Relative %: 62 %
Platelet Count: 154 10*3/uL (ref 150–400)
RBC: 4 MIL/uL — ABNORMAL LOW (ref 4.22–5.81)
RDW: 15.3 % (ref 11.5–15.5)
WBC Count: 3.3 10*3/uL — ABNORMAL LOW (ref 4.0–10.5)
nRBC: 0 % (ref 0.0–0.2)

## 2021-11-20 LAB — CMP (CANCER CENTER ONLY)
ALT: 12 U/L (ref 0–44)
AST: 15 U/L (ref 15–41)
Albumin: 4.2 g/dL (ref 3.5–5.0)
Alkaline Phosphatase: 64 U/L (ref 38–126)
Anion gap: 8 (ref 5–15)
BUN: 17 mg/dL (ref 8–23)
CO2: 26 mmol/L (ref 22–32)
Calcium: 9.7 mg/dL (ref 8.9–10.3)
Chloride: 106 mmol/L (ref 98–111)
Creatinine: 0.91 mg/dL (ref 0.61–1.24)
GFR, Estimated: >60 mL/min (ref >60)
Glucose, Bld: 108 mg/dL — ABNORMAL HIGH (ref 70–99)
Potassium: 3.7 mmol/L (ref 3.5–5.1)
Sodium: 140 mmol/L (ref 135–145)
Total Bilirubin: 0.7 mg/dL (ref 0.3–1.2)
Total Protein: 5.9 g/dL — ABNORMAL LOW (ref 6.5–8.1)

## 2021-11-20 MED ORDER — SODIUM CHLORIDE 0.9 % IV SOLN
400.0000 mg/m2 | Freq: Once | INTRAVENOUS | Status: AC
  Administered 2021-11-20: 764 mg via INTRAVENOUS
  Filled 2021-11-20: qty 38.2

## 2021-11-20 MED ORDER — SODIUM CHLORIDE 0.9 % IV SOLN
2000.0000 mg/m2 | INTRAVENOUS | Status: DC
  Administered 2021-11-20: 3800 mg via INTRAVENOUS
  Filled 2021-11-20: qty 76

## 2021-11-20 MED ORDER — FLUOROURACIL CHEMO INJECTION 2.5 GM/50ML
400.0000 mg/m2 | Freq: Once | INTRAVENOUS | Status: AC
  Administered 2021-11-20: 750 mg via INTRAVENOUS
  Filled 2021-11-20: qty 15

## 2021-11-20 MED ORDER — SODIUM CHLORIDE 0.9 % IV SOLN: INTRAVENOUS | Status: DC

## 2021-11-20 NOTE — Patient Instructions (Signed)
[place]   Discharge Instructions: Thank you for choosing [facility] to provide your oncology and hematology care.   If you have a lab appointment with the [facility], please go directly to the [facility] and check in at the registration area.   Wear comfortable clothing and clothing appropriate for easy access to any Portacath or PICC line.   We strive to give you quality time with your provider. You may need to reschedule your appointment if you arrive late (15 or more minutes).  Arriving late affects you and other patients whose appointments are after yours.  Also, if you miss three or more appointments without notifying the office, you may be dismissed from the clinic at the providers discretion.      For prescription refill requests, have your pharmacy contact our office and allow 72 hours for refills to be completed.    Today you received the following chemotherapy and/or immunotherapy agents Leucovorin & Flourouracil (ADRUCIL).      To help prevent nausea and vomiting after your treatment, we encourage you to take your nausea medication as directed.  BELOW ARE SYMPTOMS THAT SHOULD BE REPORTED IMMEDIATELY: *FEVER GREATER THAN 100.4 F (38 C) OR HIGHER *CHILLS OR SWEATING *NAUSEA AND VOMITING THAT IS NOT CONTROLLED WITH YOUR NAUSEA MEDICATION *UNUSUAL SHORTNESS OF BREATH *UNUSUAL BRUISING OR BLEEDING *URINARY PROBLEMS (pain or burning when urinating, or frequent urination) *BOWEL PROBLEMS (unusual diarrhea, constipation, pain near the anus) TENDERNESS IN MOUTH AND THROAT WITH OR WITHOUT PRESENCE OF ULCERS (sore throat, sores in mouth, or a toothache) UNUSUAL RASH, SWELLING OR PAIN  UNUSUAL VAGINAL DISCHARGE OR ITCHING   Items with * indicate a potential emergency and should be followed up as soon as possible or go to the Emergency Department if any problems should occur.  Please show the CHEMOTHERAPY ALERT CARD or IMMUNOTHERAPY  ALERT CARD at check-in to the Emergency Department and triage nurse.  Should you have questions after your visit or need to cancel or reschedule your appointment, please contact [facility]  Dept: [phone]  and follow the prompts.  Office hours are 8:00 a.m. to 4:30 p.m. Monday - Friday. Please note that voicemails left after 4:00 p.m. may not be returned until the following business day.  We are closed weekends and major holidays. You have access to a nurse at all times for urgent questions. Please call the main number to the clinic Dept: [phone] and follow the prompts.   For any non-urgent questions, you may also contact your provider using MyChart. We now offer e-Visits for anyone 59 and older to request care online for non-urgent symptoms. For details visit [email or web address].   Also download the MyChart app! Go to the app store, search "MyChart", open the app, select [facility], and log in with your MyChart username and password.  Due to Covid, a mask is required upon entering the hospital/clinic. If you do not have a mask, one will be given to you upon arrival. For doctor visits, patients may have 1 support person aged 11 or older with them. For treatment visits, patients cannot have anyone with them due to current Covid guidelines and our immunocompromised population.   Leucovorin injection What is this medication? LEUCOVORIN (loo koe VOR in) is used to prevent or treat the harmful effects of some medicines. This medicine is used to treat anemia caused by a low amount of folic acid in the body. It is also used  with 5-fluorouracil (5-FU) to treat colon cancer. This medicine may be used for other purposes; ask your health care provider or pharmacist if you have questions. What should I tell my care team before I take this medication? They need to know if you have any of these conditions: anemia from low levels of vitamin B-12 in the blood an unusual or  allergic reaction to leucovorin, folic acid, other medicines, foods, dyes, or preservatives pregnant or trying to get pregnant breast-feeding How should I use this medication? This medicine is for injection into a muscle or into a vein. It is given by a health care professional in a hospital or clinic setting. Talk to your pediatrician regarding the use of this medicine in children. Special care may be needed. Overdosage: If you think you have taken too much of this medicine contact a poison control center or emergency room at once. NOTE: This medicine is only for you. Do not share this medicine with others. What if I miss a dose? This does not apply. What may interact with this medication? capecitabine fluorouracil phenobarbital phenytoin primidone trimethoprim-sulfamethoxazole This list may not describe all possible interactions. Give your health care provider a list of all the medicines, herbs, non-prescription drugs, or dietary supplements you use. Also tell them if you smoke, drink alcohol, or use illegal drugs. Some items may interact with your medicine. What should I watch for while using this medication? Your condition will be monitored carefully while you are receiving this medicine. This medicine may increase the side effects of 5-fluorouracil, 5-FU. Tell your doctor or health care professional if you have diarrhea or mouth sores that do not get better or that get worse. What side effects may I notice from receiving this medication? Side effects that you should report to your doctor or health care professional as soon as possible: allergic reactions like skin rash, itching or hives, swelling of the face, lips, or tongue breathing problems fever, infection mouth sores unusual bleeding or bruising unusually weak or tired Side effects that usually do not require medical attention (report to your doctor or health care professional if they continue or are bothersome): constipation  or diarrhea loss of appetite nausea, vomiting This list may not describe all possible side effects. Call your doctor for medical advice about side effects. You may report side effects to FDA at 1-800-FDA-1088. Where should I keep my medication? This drug is given in a hospital or clinic and will not be stored at home. NOTE: This sheet is a summary. It may not cover all possible information. If you have questions about this medicine, talk to your doctor, pharmacist, or health care provider.  2022 Elsevier/Gold Standard (2008-03-17 00:00:00)  Fluorouracil, 5-FU injection What is this medication? FLUOROURACIL, 5-FU (flure oh YOOR a sil) is a chemotherapy drug. It slows the growth of cancer cells. This medicine is used to treat many types of cancer like breast cancer, colon or rectal cancer, pancreatic cancer, and stomach cancer. This medicine may be used for other purposes; ask your health care provider or pharmacist if you have questions. COMMON BRAND NAME(S): Adrucil What should I tell my care team before I take this medication? They need to know if you have any of these conditions: blood disorders dihydropyrimidine dehydrogenase (DPD) deficiency infection (especially a virus infection such as chickenpox, cold sores, or herpes) kidney disease liver disease malnourished, poor nutrition recent or ongoing radiation therapy an unusual or allergic reaction to fluorouracil, other chemotherapy, other medicines, foods, dyes, or preservatives  pregnant or trying to get pregnant breast-feeding How should I use this medication? This drug is given as an infusion or injection into a vein. It is administered in a hospital or clinic by a specially trained health care professional. Talk to your pediatrician regarding the use of this medicine in children. Special care may be needed. Overdosage: If you think you have taken too much of this medicine contact a poison control center or emergency room at  once. NOTE: This medicine is only for you. Do not share this medicine with others. What if I miss a dose? It is important not to miss your dose. Call your doctor or health care professional if you are unable to keep an appointment. What may interact with this medication? Do not take this medicine with any of the following medications: live virus vaccines This medicine may also interact with the following medications: medicines that treat or prevent blood clots like warfarin, enoxaparin, and dalteparin This list may not describe all possible interactions. Give your health care provider a list of all the medicines, herbs, non-prescription drugs, or dietary supplements you use. Also tell them if you smoke, drink alcohol, or use illegal drugs. Some items may interact with your medicine. What should I watch for while using this medication? Visit your doctor for checks on your progress. This drug may make you feel generally unwell. This is not uncommon, as chemotherapy can affect healthy cells as well as cancer cells. Report any side effects. Continue your course of treatment even though you feel ill unless your doctor tells you to stop. In some cases, you may be given additional medicines to help with side effects. Follow all directions for their use. Call your doctor or health care professional for advice if you get a fever, chills or sore throat, or other symptoms of a cold or flu. Do not treat yourself. This drug decreases your body's ability to fight infections. Try to avoid being around people who are sick. This medicine may increase your risk to bruise or bleed. Call your doctor or health care professional if you notice any unusual bleeding. Be careful brushing and flossing your teeth or using a toothpick because you may get an infection or bleed more easily. If you have any dental work done, tell your dentist you are receiving this medicine. Avoid taking products that contain aspirin,  acetaminophen, ibuprofen, naproxen, or ketoprofen unless instructed by your doctor. These medicines may hide a fever. Do not become pregnant while taking this medicine. Women should inform their doctor if they wish to become pregnant or think they might be pregnant. There is a potential for serious side effects to an unborn child. Talk to your health care professional or pharmacist for more information. Do not breast-feed an infant while taking this medicine. Men should inform their doctor if they wish to father a child. This medicine may lower sperm counts. Do not treat diarrhea with over the counter products. Contact your doctor if you have diarrhea that lasts more than 2 days or if it is severe and watery. This medicine can make you more sensitive to the sun. Keep out of the sun. If you cannot avoid being in the sun, wear protective clothing and use sunscreen. Do not use sun lamps or tanning beds/booths. What side effects may I notice from receiving this medication? Side effects that you should report to your doctor or health care professional as soon as possible: allergic reactions like skin rash, itching or hives, swelling of the  face, lips, or tongue low blood counts - this medicine may decrease the number of white blood cells, red blood cells and platelets. You may be at increased risk for infections and bleeding. signs of infection - fever or chills, cough, sore throat, pain or difficulty passing urine signs of decreased platelets or bleeding - bruising, pinpoint red spots on the skin, black, tarry stools, blood in the urine signs of decreased red blood cells - unusually weak or tired, fainting spells, lightheadedness breathing problems changes in vision chest pain mouth sores nausea and vomiting pain, swelling, redness at site where injected pain, tingling, numbness in the hands or feet redness, swelling, or sores on hands or feet stomach pain unusual bleeding Side effects that usually  do not require medical attention (report to your doctor or health care professional if they continue or are bothersome): changes in finger or toe nails diarrhea dry or itchy skin hair loss headache loss of appetite sensitivity of eyes to the light stomach upset unusually teary eyes This list may not describe all possible side effects. Call your doctor for medical advice about side effects. You may report side effects to FDA at 1-800-FDA-1088. Where should I keep my medication? This drug is given in a hospital or clinic and will not be stored at home. NOTE: This sheet is a summary. It may not cover all possible information. If you have questions about this medicine, talk to your doctor, pharmacist, or health care provider.  2022 Elsevier/Gold Standard (2021-05-29 00:00:00)  The chemotherapy medication bag should finish at 46 hours, 96 hours, or 7 days. For example, if your pump is scheduled for 46 hours and it was put on at 4:00 p.m., it should finish at 2:00 p.m. the day it is scheduled to come off regardless of your appointment time.     Estimated time to finish at 10:00 a.m. on Thursday 11/22/2021.   If the display on your pump reads "Low Volume" and it is beeping, take the batteries out of the pump and come to the cancer center for it to be taken off.   If the pump alarms go off prior to the pump reading "Low Volume" then call [phone] and someone can assist you.  If the plunger comes out and the chemotherapy medication is leaking out, please use your home chemo spill kit to clean up the spill. Do NOT use paper towels or other household products.  If you have problems or questions regarding your pump, please call either [phone] (24 hours a day) or the cancer center Monday-Friday 8:00 a.m.- 4:30 p.m. at the clinic number and we will assist you. If you are unable to get assistance, then go to the nearest Emergency Department and ask the staff to contact the IV team for  assistance.

## 2021-11-20 NOTE — Progress Notes (Signed)
Patient presents for treatment. RN assessment completed along with the following:  Labs/vitals reviewed - Yes, and please see collab nurse note.    Weight within 10% of previous measurement - Yes Informed consent completed and reflects current therapy/intent - Yes, on date 06/12/2021             Provider progress note reviewed - Yes, today's provider note was reviewed. Treatment/Antibody/Supportive plan reviewed - Yes, and there are no adjustments needed for today's treatment. S&H and other orders reviewed - Yes, and there are no additional orders identified. Previous treatment date reviewed - Yes, and the appropriate amount of time has elapsed between treatments. Clinic Hand Off Received from - Yes from [facility], RN  Patient to proceed with treatment.

## 2021-11-20 NOTE — Progress Notes (Signed)
[facility] OFFICE PROGRESS NOTE   Diagnosis: Rectal cancer  INTERVAL HISTORY:   Neil Perry completed another cycle of 5-fluorouracil on 11/06/2021.  No diarrhea or mouth sores.  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  Occasional constipation.  He has a fullness sensation at the anus.  He has had hemorrhoids in the past.  This sensation has improved recently.  Objective:  Vital signs in last 24 hours:  Blood pressure (!) 158/81, pulse 66, temperature 97.8 F (36.6 C), temperature source Oral, resp. rate 20, height 5' 9"  (1.753 m), weight 168 lb 14.4 oz (76.6 kg), SpO2 98 %.    HEENT: No thrush or ulcers Resp: Lungs clear bilaterally Cardio: Regular rate and rhythm GI: No hepatosplenomegaly Vascular: No leg edema Rectal: External exam reveals hyperpigmentation at the perineum, soft hemorrhoids at the anal verge  Portacath/PICC-without erythema  Lab Results:  Lab Results  Component Value Date   WBC 3.3 (L) 11/20/2021   HGB 12.5 (L) 11/20/2021   HCT 35.1 (L) 11/20/2021   MCV 87.8 11/20/2021   PLT 154 11/20/2021   NEUTROABS 2.1 11/20/2021    CMP  Lab Results  Component Value Date   NA 141 11/06/2021   K 3.7 11/06/2021   CL 106 11/06/2021   CO2 27 11/06/2021   GLUCOSE 103 (H) 11/06/2021   BUN 17 11/06/2021   CREATININE 0.86 11/06/2021   CALCIUM 9.5 11/06/2021   PROT 5.9 (L) 11/06/2021   ALBUMIN 3.9 11/06/2021   AST 14 (L) 11/06/2021   ALT 10 11/06/2021   ALKPHOS 69 11/06/2021   BILITOT 0.8 11/06/2021   GFRNONAA >60 11/06/2021    Lab Results  Component Value Date   CEA 2.71 11/06/2021      Medications: I have reviewed the patient's current medications.   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 2-3 new hypervascular masses in the liver dome, no abdominal lymphadenopathy, stable benign left adrenal adenoma, stable right lower pole kidney mass Ultrasound-guided biopsy of a liver lesion 05/07/2021-metastatic  adenocarcinoma with extensive necrosis, immunohistochemical profile consistent with a colorectal primary; foundation 1-microsatellite stable, tumor mutation burden 5, K-rasG 12V, NRAS wildtype Colonoscopy 05/22/2021-ulcerated partially obstructing mass at 15 cm from anal verge CTs 05/30/2021-numerous small pulmonary nodules concerning for metastases, thickening of the rectum, multiple liver metastases Cycle 1 FOLFOX 06/12/2021 Cycle 2 FOLFOX 06/26/2021 Cycle 3 FOLFOX 07/10/2021, oxaliplatin dose reduced due to progressive decline in the [place] and platelet count Cycle 4 FOLFOX 07/24/2021 Cycle 5 FOLFOX 08/07/2021, 5-FU bolus eliminated and oxaliplatin dose reduced, insurance would not approve Udenyca CTs 08/15/2021-decrease size of lung nodules, decreased hepatic metastases, persistent anorectal wall thickening, stable right renal mass Cycle 6 FOLFOX 08/21/2021, 5-FU bolus and 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 Cycle 7 FOLFOX 09/04/2021, 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 Cycle 8 FOLFOX 09/18/2021, 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 Cycle 9 FOLFOX 10/08/2021, 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 Cycle 10 FOLFOX 10/23/2021, 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 CTs 11/01/2021-decrease in size of occasional small pulmonary nodules, continued decrease in size of multiple hypoenhancing hepatic metastases, unchanged posttreatment appearance of the low rectum, unchanged exophytic mass of the inferior pole of the right kidney measuring 1.5 x 1.3 cm Cycle 11 5-fluorouracil 11/06/2021 Cycle twelve 5-fluorouracil 11/12/2021 Prostate cancer-simple prostatectomy January 2019, Gleason 3+3, 10 to 12% of specimen Active surveillance, biopsy May 2019 with small focus of Gleason 3+3 disease in 1/6 cores, surveillance continued Elevated PSA 04/20/2020 Biopsy 06/19/2020 8/12 cores positive, Gleason 4+4 PET scan 07/07/2020-negative  CT 07/07/2020 right  iliac and retrocaval adenopathy, 1.3 cm subcapsular right lower pole renal lesion Androgen deprivation therapy beginning 08/21/2020 Radiation to prostate, seminal vesicles, and pelvic lymph nodes to 10/22 - 12/28/2020 He continues every 64-monthFirmagon and daily Xtandi   3.  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  Mitral valve prolapse 5.  Family history of prostate cancer 6.  08/15/2021 with leg weakness and an episode of slurred speech-TIA?,  Brain imaging negative    Disposition: Mr. RObryanappears stable.  He complete another cycle of 5-fluorouracil today.  He will return for an office visit and chemotherapy in 2 weeks. He has external hemorrhoids.  He will try an over-the-counter hemorrhoid cream.  GBetsy Coder MD  11/20/2021  10:27 AM

## 2021-11-20 NOTE — Progress Notes (Signed)
Patient seen by Dr. Sherrill today ? ?Vitals are within treatment parameters. ? ?Labs reviewed by Dr. Sherrill and are within treatment parameters. ? ?Per physician team, patient is ready for treatment and there are NO modifications to the treatment plan.  ?

## 2021-11-22 ENCOUNTER — Other Ambulatory Visit: Payer: Self-pay

## 2021-11-22 ENCOUNTER — Inpatient Hospital Stay: Payer: Medicare Other | Attending: Nurse Practitioner

## 2021-11-22 VITALS — BP 129/59 | HR 67 | Temp 98.1°F | Resp 20

## 2021-11-22 DIAGNOSIS — C787 Secondary malignant neoplasm of liver and intrahepatic bile duct: Secondary | ICD-10-CM | POA: Diagnosis not present

## 2021-11-22 DIAGNOSIS — I341 Nonrheumatic mitral (valve) prolapse: Secondary | ICD-10-CM | POA: Insufficient documentation

## 2021-11-22 DIAGNOSIS — C20 Malignant neoplasm of rectum: Secondary | ICD-10-CM | POA: Diagnosis not present

## 2021-11-22 DIAGNOSIS — Z5111 Encounter for antineoplastic chemotherapy: Secondary | ICD-10-CM | POA: Insufficient documentation

## 2021-11-22 DIAGNOSIS — Z8546 Personal history of malignant neoplasm of prostate: Secondary | ICD-10-CM | POA: Insufficient documentation

## 2021-11-22 DIAGNOSIS — Z8042 Family history of malignant neoplasm of prostate: Secondary | ICD-10-CM | POA: Insufficient documentation

## 2021-11-22 MED ORDER — HEPARIN SOD (PORK) LOCK FLUSH 100 UNIT/ML IV SOLN
500.0000 [IU] | Freq: Once | INTRAVENOUS | Status: AC | PRN
  Administered 2021-11-22: 500 [IU]

## 2021-11-22 MED ORDER — SODIUM CHLORIDE 0.9% FLUSH
10.0000 mL | INTRAVENOUS | Status: DC | PRN
  Administered 2021-11-22: 10 mL

## 2021-11-22 NOTE — Patient Instructions (Signed)
[facility]  Discharge Instructions: Thank you for choosing [facility] to provide your oncology and hematology care.   If you have a lab appointment with the [facility], please go directly to the [facility] and check in at the registration area.   Wear comfortable clothing and clothing appropriate for easy access to any Portacath or PICC line.   We strive to give you quality time with your provider. You may need to reschedule your appointment if you arrive late (15 or more minutes).  Arriving late affects you and other patients whose appointments are after yours.  Also, if you miss three or more appointments without notifying the office, you may be dismissed from the clinic at the providers discretion.      For prescription refill requests, have your pharmacy contact our office and allow 72 hours for refills to be completed.    Today you received the following chemotherapy and/or immunotherapy agents 5FU  Fluorouracil, 5-FU injection What is this medication? FLUOROURACIL, 5-FU (flure oh YOOR a sil) is a chemotherapy drug. It slows the growth of cancer cells. This medicine is used to treat many types of cancer like breast cancer, colon or rectal cancer, pancreatic cancer, and stomach cancer. This medicine may be used for other purposes; ask your health care provider or pharmacist if you have questions. COMMON BRAND NAME(S): Adrucil What should I tell my care team before I take this medication? They need to know if you have any of these conditions: blood disorders dihydropyrimidine dehydrogenase (DPD) deficiency infection (especially a virus infection such as chickenpox, cold sores, or herpes) kidney disease liver disease malnourished, poor nutrition recent or ongoing radiation therapy an unusual or allergic reaction to fluorouracil, other chemotherapy, other medicines, foods, dyes, or preservatives pregnant or trying to get  pregnant breast-feeding How should I use this medication? This drug is given as an infusion or injection into a vein. It is administered in a hospital or clinic by a specially trained health care professional. Talk to your pediatrician regarding the use of this medicine in children. Special care may be needed. Overdosage: If you think you have taken too much of this medicine contact a poison control center or emergency room at once. NOTE: This medicine is only for you. Do not share this medicine with others. What if I miss a dose? It is important not to miss your dose. Call your doctor or health care professional if you are unable to keep an appointment. What may interact with this medication? Do not take this medicine with any of the following medications: live virus vaccines This medicine may also interact with the following medications: medicines that treat or prevent blood clots like warfarin, enoxaparin, and dalteparin This list may not describe all possible interactions. Give your health care provider a list of all the medicines, herbs, non-prescription drugs, or dietary supplements you use. Also tell them if you smoke, drink alcohol, or use illegal drugs. Some items may interact with your medicine. What should I watch for while using this medication? Visit your doctor for checks on your progress. This drug may make you feel generally unwell. This is not uncommon, as chemotherapy can affect healthy cells as well as cancer cells. Report any side effects. Continue your course of treatment even though you feel ill unless your doctor tells you to stop. In some cases, you may be given additional medicines to help with side effects. Follow all directions for their use. Call your  doctor or health care professional for advice if you get a fever, chills or sore throat, or other symptoms of a cold or flu. Do not treat yourself. This drug decreases your body's ability to fight infections. Try to avoid  being around people who are sick. This medicine may increase your risk to bruise or bleed. Call your doctor or health care professional if you notice any unusual bleeding. Be careful brushing and flossing your teeth or using a toothpick because you may get an infection or bleed more easily. If you have any dental work done, tell your dentist you are receiving this medicine. Avoid taking products that contain aspirin, acetaminophen, ibuprofen, naproxen, or ketoprofen unless instructed by your doctor. These medicines may hide a fever. Do not become pregnant while taking this medicine. Women should inform their doctor if they wish to become pregnant or think they might be pregnant. There is a potential for serious side effects to an unborn child. Talk to your health care professional or pharmacist for more information. Do not breast-feed an infant while taking this medicine. Men should inform their doctor if they wish to father a child. This medicine may lower sperm counts. Do not treat diarrhea with over the counter products. Contact your doctor if you have diarrhea that lasts more than 2 days or if it is severe and watery. This medicine can make you more sensitive to the sun. Keep out of the sun. If you cannot avoid being in the sun, wear protective clothing and use sunscreen. Do not use sun lamps or tanning beds/booths. What side effects may I notice from receiving this medication? Side effects that you should report to your doctor or health care professional as soon as possible: allergic reactions like skin rash, itching or hives, swelling of the face, lips, or tongue low blood counts - this medicine may decrease the number of white blood cells, red blood cells and platelets. You may be at increased risk for infections and bleeding. signs of infection - fever or chills, cough, sore throat, pain or difficulty passing urine signs of decreased platelets or bleeding - bruising, pinpoint red spots on the  skin, black, tarry stools, blood in the urine signs of decreased red blood cells - unusually weak or tired, fainting spells, lightheadedness breathing problems changes in vision chest pain mouth sores nausea and vomiting pain, swelling, redness at site where injected pain, tingling, numbness in the hands or feet redness, swelling, or sores on hands or feet stomach pain unusual bleeding Side effects that usually do not require medical attention (report to your doctor or health care professional if they continue or are bothersome): changes in finger or toe nails diarrhea dry or itchy skin hair loss headache loss of appetite sensitivity of eyes to the light stomach upset unusually teary eyes This list may not describe all possible side effects. Call your doctor for medical advice about side effects. You may report side effects to FDA at 1-800-FDA-1088. Where should I keep my medication? This drug is given in a hospital or clinic and will not be stored at home. NOTE: This sheet is a summary. It may not cover all possible information. If you have questions about this medicine, talk to your doctor, pharmacist, or health care provider.  2022 Elsevier/Gold Standard (2021-05-29 00:00:00)      To help prevent nausea and vomiting after your treatment, we encourage you to take your nausea medication as directed.  BELOW ARE SYMPTOMS THAT SHOULD BE REPORTED IMMEDIATELY: *FEVER GREATER  THAN 100.4 F (38 C) OR HIGHER *CHILLS OR SWEATING *NAUSEA AND VOMITING THAT IS NOT CONTROLLED WITH YOUR NAUSEA MEDICATION *UNUSUAL SHORTNESS OF BREATH *UNUSUAL BRUISING OR BLEEDING *URINARY PROBLEMS (pain or burning when urinating, or frequent urination) *BOWEL PROBLEMS (unusual diarrhea, constipation, pain near the anus) TENDERNESS IN MOUTH AND THROAT WITH OR WITHOUT PRESENCE OF ULCERS (sore throat, sores in mouth, or a toothache) UNUSUAL RASH, SWELLING OR PAIN  UNUSUAL VAGINAL DISCHARGE OR ITCHING    Items with * indicate a potential emergency and should be followed up as soon as possible or go to the Emergency Department if any problems should occur.  Please show the CHEMOTHERAPY ALERT CARD or IMMUNOTHERAPY ALERT CARD at check-in to the Emergency Department and triage nurse.  Should you have questions after your visit or need to cancel or reschedule your appointment, please contact [facility]  Dept: [phone]  and follow the prompts.  Office hours are 8:00 a.m. to 4:30 p.m. Monday - Friday. Please note that voicemails left after 4:00 p.m. may not be returned until the following business day.  We are closed weekends and major holidays. You have access to a nurse at all times for urgent questions. Please call the main number to the clinic Dept: [phone] and follow the prompts.   For any non-urgent questions, you may also contact your provider using MyChart. We now offer e-Visits for anyone 75 and older to request care online for non-urgent symptoms. For details visit [email or web address].   Also download the MyChart app! Go to the app store, search "MyChart", open the app, select [facility], and log in with your MyChart username and password.  Due to Covid, a mask is required upon entering the hospital/clinic. If you do not have a mask, one will be given to you upon arrival. For doctor visits, patients may have 1 support person aged 34 or older with them. For treatment visits, patients cannot have anyone with them due to current Covid guidelines and our immunocompromised population.

## 2021-12-02 ENCOUNTER — Other Ambulatory Visit: Payer: Self-pay | Admitting: Oncology

## 2021-12-03 ENCOUNTER — Telehealth: Payer: Self-pay | Admitting: Family Medicine

## 2021-12-03 NOTE — Chronic Care Management (AMB) (Signed)
?  Chronic Care Management  ? ?Outreach Note ? ?12/03/2021 ?Name: Malvern Kadlec MRN: [id number] DOB: October 06, 1940 ? ?Referred by: Vivi Barrack, MD ?Reason for referral : No chief complaint on file. ? ? ?An unsuccessful telephone outreach was attempted today. The patient was referred to the pharmacist for assistance with care management and care coordination.  ? ?Follow Up Plan:  ? ?Tatjana Dellinger ?[facility] Scheduler  ?

## 2021-12-04 ENCOUNTER — Encounter: Payer: Self-pay | Admitting: Oncology

## 2021-12-04 ENCOUNTER — Other Ambulatory Visit (HOSPITAL_COMMUNITY): Payer: Self-pay

## 2021-12-04 ENCOUNTER — Inpatient Hospital Stay (HOSPITAL_BASED_OUTPATIENT_CLINIC_OR_DEPARTMENT_OTHER): Payer: Medicare Other | Admitting: Oncology

## 2021-12-04 ENCOUNTER — Inpatient Hospital Stay: Payer: Medicare Other

## 2021-12-04 ENCOUNTER — Other Ambulatory Visit: Payer: Self-pay

## 2021-12-04 ENCOUNTER — Telehealth: Payer: Self-pay | Admitting: Pharmacy Technician

## 2021-12-04 VITALS — BP 166/73 | HR 64 | Temp 97.8°F | Resp 18 | Ht 69.0 in | Wt 169.8 lb

## 2021-12-04 DIAGNOSIS — Z95828 Presence of other vascular implants and grafts: Secondary | ICD-10-CM

## 2021-12-04 DIAGNOSIS — Z5111 Encounter for antineoplastic chemotherapy: Secondary | ICD-10-CM | POA: Diagnosis not present

## 2021-12-04 DIAGNOSIS — C20 Malignant neoplasm of rectum: Secondary | ICD-10-CM

## 2021-12-04 LAB — CBC WITH DIFFERENTIAL (CANCER CENTER ONLY)
Abs Immature Granulocytes: 0.01 10*3/uL (ref 0.00–0.07)
Basophils Absolute: 0 10*3/uL (ref 0.0–0.1)
Basophils Relative: 0 %
Eosinophils Absolute: 0.2 10*3/uL (ref 0.0–0.5)
Eosinophils Relative: 6 %
HCT: 34.6 % — ABNORMAL LOW (ref 39.0–52.0)
Hemoglobin: 12.3 g/dL — ABNORMAL LOW (ref 13.0–17.0)
Immature Granulocytes: 0 %
Lymphocytes Relative: 16 %
Lymphs Abs: 0.5 10*3/uL — ABNORMAL LOW (ref 0.7–4.0)
MCH: 31 pg (ref 26.0–34.0)
MCHC: 35.5 g/dL (ref 30.0–36.0)
MCV: 87.2 fL (ref 80.0–100.0)
Monocytes Absolute: 0.3 10*3/uL (ref 0.1–1.0)
Monocytes Relative: 9 %
Neutro Abs: 2.2 10*3/uL (ref 1.7–7.7)
Neutrophils Relative %: 69 %
Platelet Count: 150 10*3/uL (ref 150–400)
RBC: 3.97 MIL/uL — ABNORMAL LOW (ref 4.22–5.81)
RDW: 15.7 % — ABNORMAL HIGH (ref 11.5–15.5)
WBC Count: 3.3 10*3/uL — ABNORMAL LOW (ref 4.0–10.5)
nRBC: 0 % (ref 0.0–0.2)

## 2021-12-04 LAB — CMP (CANCER CENTER ONLY)
ALT: 10 U/L (ref 0–44)
AST: 13 U/L — ABNORMAL LOW (ref 15–41)
Albumin: 4 g/dL (ref 3.5–5.0)
Alkaline Phosphatase: 64 U/L (ref 38–126)
Anion gap: 9 (ref 5–15)
BUN: 20 mg/dL (ref 8–23)
CO2: 26 mmol/L (ref 22–32)
Calcium: 9.7 mg/dL (ref 8.9–10.3)
Chloride: 107 mmol/L (ref 98–111)
Creatinine: 1.06 mg/dL (ref 0.61–1.24)
GFR, Estimated: >60 mL/min (ref >60)
Glucose, Bld: 131 mg/dL — ABNORMAL HIGH (ref 70–99)
Potassium: 3.4 mmol/L — ABNORMAL LOW (ref 3.5–5.1)
Sodium: 142 mmol/L (ref 135–145)
Total Bilirubin: 0.7 mg/dL (ref 0.3–1.2)
Total Protein: 5.9 g/dL — ABNORMAL LOW (ref 6.5–8.1)

## 2021-12-04 LAB — CEA (ACCESS): CEA (CHCC): 2.32 ng/mL (ref 0.00–5.00)

## 2021-12-04 MED ORDER — SODIUM CHLORIDE 0.9 % IV SOLN
2000.0000 mg/m2 | INTRAVENOUS | Status: DC
  Administered 2021-12-04: 3800 mg via INTRAVENOUS
  Filled 2021-12-04: qty 76

## 2021-12-04 MED ORDER — FLUOROURACIL CHEMO INJECTION 2.5 GM/50ML
400.0000 mg/m2 | Freq: Once | INTRAVENOUS | Status: AC
  Administered 2021-12-04: 750 mg via INTRAVENOUS
  Filled 2021-12-04: qty 15

## 2021-12-04 MED ORDER — CAPECITABINE 500 MG PO TABS
ORAL_TABLET | ORAL | 0 refills | Status: DC
  Filled 2021-12-04: qty 180, fill #0

## 2021-12-04 MED ORDER — SODIUM CHLORIDE 0.9 % IV SOLN
400.0000 mg/m2 | Freq: Once | INTRAVENOUS | Status: AC
  Administered 2021-12-04: 764 mg via INTRAVENOUS
  Filled 2021-12-04: qty 38.2

## 2021-12-04 MED ORDER — SODIUM CHLORIDE 0.9 % IV SOLN: Freq: Once | INTRAVENOUS | Status: AC

## 2021-12-04 NOTE — Progress Notes (Signed)
?[facility] ?OFFICE PROGRESS NOTE ? ? ?Diagnosis: Rectal cancer ? ?INTERVAL HISTORY:  ? ?Neil Perry completed another cycle of 5-fluorouracil 11/12/2021.  No mouth sores, nausea, or diarrhea.  No difficulty with bowel function.  He has noted darkening of a mole at the left shoulder.  No other complaint. ? ?Objective: ? ?Vital signs in last 24 hours: ? ?Blood pressure (!) 166/73, pulse 64, temperature 97.8 ?F (36.6 ?C), temperature source Oral, resp. rate 18, height 5' 9" (1.753 m), weight 169 lb 12.8 oz (77 kg), SpO2 100 %. ?  ? ?HEENT: No thrush or ulcers ?Resp: Lungs clear bilaterally ?Cardio: Regular rate and rhythm ?GI: No hepatomegaly ?Vascular: No leg edema  ?Skin: Palms without erythema, hyperpigmented keratosis at the left upper back ? ?Portacath/PICC-without erythema ? ?Lab Results: ? ?Lab Results  ?Component Value Date  ? WBC 3.3 (L) 12/04/2021  ? HGB 12.3 (L) 12/04/2021  ? HCT 34.6 (L) 12/04/2021  ? MCV 87.2 12/04/2021  ? PLT 150 12/04/2021  ? NEUTROABS 2.2 12/04/2021  ? ? ?CMP  ?Lab Results  ?Component Value Date  ? NA 140 11/20/2021  ? K 3.7 11/20/2021  ? CL 106 11/20/2021  ? CO2 26 11/20/2021  ? GLUCOSE 108 (H) 11/20/2021  ? BUN 17 11/20/2021  ? CREATININE 0.91 11/20/2021  ? CALCIUM 9.7 11/20/2021  ? PROT 5.9 (L) 11/20/2021  ? ALBUMIN 4.2 11/20/2021  ? AST 15 11/20/2021  ? ALT 12 11/20/2021  ? ALKPHOS 64 11/20/2021  ? BILITOT 0.7 11/20/2021  ? GFRNONAA >60 11/20/2021  ? ? ?Lab Results  ?Component Value Date  ? CEA 2.71 11/06/2021  ? ? ?Medications: I have reviewed the patient's current medications. ? ? ?Assessment/Plan: ? ?Rectal cancer ?MRI abdomen 04/26/2019 2-3 new hypervascular masses in the liver dome, no abdominal lymphadenopathy, stable benign left adrenal adenoma, stable right lower pole kidney mass ?Ultrasound-guided biopsy of a liver lesion 05/07/2021-metastatic adenocarcinoma with extensive necrosis, immunohistochemical profile consistent with a colorectal primary; foundation  1-microsatellite stable, tumor mutation burden 5, K-rasG 12V, NRAS wildtype ?Colonoscopy 05/22/2021-ulcerated partially obstructing mass at 15 cm from anal verge ?CTs 05/30/2021-numerous small pulmonary nodules concerning for metastases, thickening of the rectum, multiple liver metastases ?Cycle 1 FOLFOX 06/12/2021 ?Cycle 2 FOLFOX 06/26/2021 ?Cycle 3 FOLFOX 07/10/2021, oxaliplatin dose reduced due to progressive decline in the [place] and platelet count ?Cycle 4 FOLFOX 07/24/2021 ?Cycle 5 FOLFOX 08/07/2021, 5-FU bolus eliminated and oxaliplatin dose reduced, insurance would not approve Udenyca ?CTs 08/15/2021-decrease size of lung nodules, decreased hepatic metastases, persistent anorectal wall thickening, stable right renal mass ?Cycle 6 FOLFOX 08/21/2021, 5-FU bolus and 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 ?Cycle 7 FOLFOX 09/04/2021, 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 ?Cycle 8 FOLFOX 09/18/2021, 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 ?Cycle 9 FOLFOX 10/08/2021, 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 ?Cycle 10 FOLFOX 10/23/2021, 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 ?CTs 11/01/2021-decrease in size of occasional small pulmonary nodules, continued decrease in size of multiple hypoenhancing hepatic metastases, unchanged posttreatment appearance of the low rectum, unchanged exophytic mass of the inferior pole of the right kidney measuring 1.5 x 1.3 cm ?Cycle 11 5-fluorouracil 11/06/2021 ?Cycle 12 5-fluorouracil 11/20/2021 ?Cycle 13 5-fluorouracil 12/04/2021 ?Prostate cancer-simple prostatectomy January 2019, Gleason 3+3, 10 to 12% of specimen ?Active surveillance, biopsy May 2019 with small focus of Gleason 3+3 disease in 1/6 cores, surveillance continued ?Elevated PSA 04/20/2020 ?Biopsy 06/19/2020 8/12 cores positive, Gleason 4+4 ?PET scan 07/07/2020-negative ?CT 07/07/2020 right iliac and retrocaval adenopathy, 1.3 cm subcapsular right  lower pole renal  lesion ?Androgen deprivation therapy beginning 08/21/2020 ?Radiation to prostate, seminal vesicles, and pelvic lymph nodes to 10/22 - 12/28/2020 ?He continues every 73-monthFirmagon and daily Xtandi ?  ?3.  Right renal mass consistent with a renal cell carcinoma-stable on MRI abdomen 04/25/2021, stable on CT 08/15/2021 ?4.  Mitral valve prolapse ?5.  Family history of prostate cancer ?6.  08/15/2021 with leg weakness and an episode of slurred speech-TIA?,  Brain imaging negative ? ? ? ? ?Disposition: ?Neil Perry appears stable.  He continues maintenance 5-fluorouracil.  We discussed a change to oral maintenance therapy with capecitabine.  He agrees.  The plan is to begin capecitabine in 2 weeks. ? ?We reviewed potential toxicities associated with capecitabine including the chance of mucositis, diarrhea, skin hyperpigmentation, and hand/foot syndrome.  He agrees to proceed. ? ?A prescription for capecitabine was entered today. ? ?GBetsy Coder MD ? ?12/04/2021  ?10:31 AM ? ? ?

## 2021-12-04 NOTE — Progress Notes (Signed)
Patient presents for treatment. RN assessment completed along with the following: ? ?Labs/vitals reviewed - Yes, and within treatment parameters.   ?Weight within 10% of previous measurement - Yes ?Informed consent completed and reflects current therapy/intent - Yes, on date 06/12/21             ?Provider progress note reviewed - Yes, today's provider note was reviewed. ?Treatment/Antibody/Supportive plan reviewed - Yes, and there are no adjustments needed for today's treatment. ?S&H and other orders reviewed - Yes, and there are no additional orders identified. ?Previous treatment date reviewed - Yes, and the appropriate amount of time has elapsed between treatments. ?Clinic Hand Off Received from - Tylene Fantasia, RN ? ?Patient to proceed with treatment.   ?

## 2021-12-04 NOTE — Telephone Encounter (Signed)
Oral Oncology Patient Advocate Encounter ? ?After completing a benefits investigation, prior authorization for Capecitabine is not required at this time through Medicare A/B. ? ?Patient's copay is $54.98.  Patient has secondary insurance through [facility] and must use [facility] to [employer]. ? ?Dennison Nancy CPHT ?Specialty Pharmacy Patient Advocate ?[facility] ?Phone [phone] ?Fax [phone] ?12/04/2021 2:06 PM ?   ? ? ?  ? ?

## 2021-12-04 NOTE — Patient Instructions (Signed)
[facility]  Discharge Instructions: ?Thank you for choosing [facility] to provide your oncology and hematology care.  ? ?If you have a lab appointment with the [facility], please go directly to the [facility] and check in at the registration area. ?  ?Wear comfortable clothing and clothing appropriate for easy access to any Portacath or PICC line.  ? ?We strive to give you quality time with your provider. You may need to reschedule your appointment if you arrive late (15 or more minutes).  Arriving late affects you and other patients whose appointments are after yours.  Also, if you miss three or more appointments without notifying the office, you may be dismissed from the clinic at the provider?s discretion.    ?  ?For prescription refill requests, have your pharmacy contact our office and allow 72 hours for refills to be completed.   ? ?Today you received the following chemotherapy and/or immunotherapy agents 5FU ? ?Fluorouracil, 5-FU injection ?What is this medication? ?FLUOROURACIL, 5-FU (flure oh YOOR a sil) is a chemotherapy drug. It slows the growth of cancer cells. This medicine is used to treat many types of cancer like breast cancer, colon or rectal cancer, pancreatic cancer, and stomach cancer. ?This medicine may be used for other purposes; ask your health care provider or pharmacist if you have questions. ?COMMON BRAND NAME(S): Adrucil ?What should I tell my care team before I take this medication? ?They need to know if you have any of these conditions: ?blood disorders ?dihydropyrimidine dehydrogenase (DPD) deficiency ?infection (especially a virus infection such as chickenpox, cold sores, or herpes) ?kidney disease ?liver disease ?malnourished, poor nutrition ?recent or ongoing radiation therapy ?an unusual or allergic reaction to fluorouracil, other chemotherapy, other medicines, foods, dyes, or preservatives ?pregnant or trying to get  pregnant ?breast-feeding ?How should I use this medication? ?This drug is given as an infusion or injection into a vein. It is administered in a hospital or clinic by a specially trained health care professional. ?Talk to your pediatrician regarding the use of this medicine in children. Special care may be needed. ?Overdosage: If you think you have taken too much of this medicine contact a poison control center or emergency room at once. ?NOTE: This medicine is only for you. Do not share this medicine with others. ?What if I miss a dose? ?It is important not to miss your dose. Call your doctor or health care professional if you are unable to keep an appointment. ?What may interact with this medication? ?Do not take this medicine with any of the following medications: ?live virus vaccines ?This medicine may also interact with the following medications: ?medicines that treat or prevent blood clots like warfarin, enoxaparin, and dalteparin ?This list may not describe all possible interactions. Give your health care provider a list of all the medicines, herbs, non-prescription drugs, or dietary supplements you use. Also tell them if you smoke, drink alcohol, or use illegal drugs. Some items may interact with your medicine. ?What should I watch for while using this medication? ?Visit your doctor for checks on your progress. This drug may make you feel generally unwell. This is not uncommon, as chemotherapy can affect healthy cells as well as cancer cells. Report any side effects. Continue your course of treatment even though you feel ill unless your doctor tells you to stop. ?In some cases, you may be given additional medicines to help with side effects. Follow all directions for their use. ?Call your  doctor or health care professional for advice if you get a fever, chills or sore throat, or other symptoms of a cold or flu. Do not treat yourself. This drug decreases your body's ability to fight infections. Try to avoid  being around people who are sick. ?This medicine may increase your risk to bruise or bleed. Call your doctor or health care professional if you notice any unusual bleeding. ?Be careful brushing and flossing your teeth or using a toothpick because you may get an infection or bleed more easily. If you have any dental work done, tell your dentist you are receiving this medicine. ?Avoid taking products that contain aspirin, acetaminophen, ibuprofen, naproxen, or ketoprofen unless instructed by your doctor. These medicines may hide a fever. ?Do not become pregnant while taking this medicine. Women should inform their doctor if they wish to become pregnant or think they might be pregnant. There is a potential for serious side effects to an unborn child. Talk to your health care professional or pharmacist for more information. Do not breast-feed an infant while taking this medicine. ?Men should inform their doctor if they wish to father a child. This medicine may lower sperm counts. ?Do not treat diarrhea with over the counter products. Contact your doctor if you have diarrhea that lasts more than 2 days or if it is severe and watery. ?This medicine can make you more sensitive to the sun. Keep out of the sun. If you cannot avoid being in the sun, wear protective clothing and use sunscreen. Do not use sun lamps or tanning beds/booths. ?What side effects may I notice from receiving this medication? ?Side effects that you should report to your doctor or health care professional as soon as possible: ?allergic reactions like skin rash, itching or hives, swelling of the face, lips, or tongue ?low blood counts - this medicine may decrease the number of white blood cells, red blood cells and platelets. You may be at increased risk for infections and bleeding. ?signs of infection - fever or chills, cough, sore throat, pain or difficulty passing urine ?signs of decreased platelets or bleeding - bruising, pinpoint red spots on the  skin, black, tarry stools, blood in the urine ?signs of decreased red blood cells - unusually weak or tired, fainting spells, lightheadedness ?breathing problems ?changes in vision ?chest pain ?mouth sores ?nausea and vomiting ?pain, swelling, redness at site where injected ?pain, tingling, numbness in the hands or feet ?redness, swelling, or sores on hands or feet ?stomach pain ?unusual bleeding ?Side effects that usually do not require medical attention (report to your doctor or health care professional if they continue or are bothersome): ?changes in finger or toe nails ?diarrhea ?dry or itchy skin ?hair loss ?headache ?loss of appetite ?sensitivity of eyes to the light ?stomach upset ?unusually teary eyes ?This list may not describe all possible side effects. Call your doctor for medical advice about side effects. You may report side effects to FDA at 1-800-FDA-1088. ?Where should I keep my medication? ?This drug is given in a hospital or clinic and will not be stored at home. ?NOTE: This sheet is a summary. It may not cover all possible information. If you have questions about this medicine, talk to your doctor, pharmacist, or health care provider. ?? 2022 Elsevier/Gold Standard (2021-05-29 00:00:00) ?   ?  ?To help prevent nausea and vomiting after your treatment, we encourage you to take your nausea medication as directed. ? ?BELOW ARE SYMPTOMS THAT SHOULD BE REPORTED IMMEDIATELY: ?*FEVER GREATER  THAN 100.4 F (38 ?C) OR HIGHER ?*CHILLS OR SWEATING ?*NAUSEA AND VOMITING THAT IS NOT CONTROLLED WITH YOUR NAUSEA MEDICATION ?*UNUSUAL SHORTNESS OF BREATH ?*UNUSUAL BRUISING OR BLEEDING ?*URINARY PROBLEMS (pain or burning when urinating, or frequent urination) ?*BOWEL PROBLEMS (unusual diarrhea, constipation, pain near the anus) ?TENDERNESS IN MOUTH AND THROAT WITH OR WITHOUT PRESENCE OF ULCERS (sore throat, sores in mouth, or a toothache) ?UNUSUAL RASH, SWELLING OR PAIN  ?UNUSUAL VAGINAL DISCHARGE OR ITCHING   ? ?Items with * indicate a potential emergency and should be followed up as soon as possible or go to the Emergency Department if any problems should occur. ? ?Please show the CHEMOTHERAPY ALERT CARD or IMMUNOTHERAPY ALERT CA

## 2021-12-04 NOTE — Patient Instructions (Signed)

## 2021-12-04 NOTE — Progress Notes (Signed)
Patient seen by Dr. Sherrill today ? ?Vitals are within treatment parameters. ? ?Labs reviewed by Dr. Sherrill and are within treatment parameters. ? ?Per physician team, patient is ready for treatment and there are NO modifications to the treatment plan.  ?

## 2021-12-05 ENCOUNTER — Other Ambulatory Visit (HOSPITAL_COMMUNITY): Payer: Self-pay

## 2021-12-05 ENCOUNTER — Telehealth: Payer: Self-pay | Admitting: Pharmacist

## 2021-12-05 DIAGNOSIS — C20 Malignant neoplasm of rectum: Secondary | ICD-10-CM

## 2021-12-05 MED ORDER — CAPECITABINE 500 MG PO TABS
1500.0000 mg | ORAL_TABLET | Freq: Two times a day (BID) | ORAL | 0 refills | Status: DC
  Filled 2021-12-05: qty 180, 30d supply, fill #0

## 2021-12-05 NOTE — Telephone Encounter (Signed)
Oral Oncology Pharmacist Encounter ? ?Received new prescription for Xeloda (capecitabine) for maintenance therapy of rectal cancer, planned duration until disease progression or unacceptable drug toxicity. Md would like for the patient to start on 12/18/21. ? ?CMP from 12/04/21 assessed, no relevant lab abnormalities. Prescription dose and frequency assessed.  ? ?Current medication list in Epic reviewed, no DDIs with capecitabine identified. ? ?Evaluated chart and no patient barriers to medication adherence identified.  ? ?Prescription has been e-scribed to the [facility] for benefits analysis and approval. ? ?Oral Oncology Clinic will continue to follow for insurance authorization, copayment issues, initial counseling and start date. ? ?Patient agreed to treatment on 12/04/21 per MD documentation. ? ?Darl Pikes, PharmD, BCPS, BCOP, CPP ?Hematology/Oncology Clinical Pharmacist Practitioner ?[place]/DB/AP Oral Chemotherapy Navigation Clinic ?[phone] ? ?12/05/2021 10:24 AM ? ?

## 2021-12-06 ENCOUNTER — Other Ambulatory Visit: Payer: Self-pay

## 2021-12-06 ENCOUNTER — Inpatient Hospital Stay: Payer: Medicare Other

## 2021-12-06 ENCOUNTER — Other Ambulatory Visit (HOSPITAL_COMMUNITY): Payer: Self-pay

## 2021-12-06 NOTE — Telephone Encounter (Signed)
Test claim to [facility] as a secondary payor rejected at [facility]. Patient must use [facility] to use [employer]. ? ?Will follow up with [facility] to make sure they are billing insurances correctly.  ? ? ?Dennison Nancy CPHT ?Specialty Pharmacy Patient Advocate ?[place] ?Phone [phone] ?Fax [phone] ?12/06/2021 1:47 PM ? ?

## 2021-12-07 ENCOUNTER — Telehealth: Payer: Self-pay | Admitting: Family Medicine

## 2021-12-07 MED ORDER — CAPECITABINE 500 MG PO TABS: 1500.0000 mg | ORAL_TABLET | Freq: Two times a day (BID) | ORAL | 0 refills | Status: DC

## 2021-12-07 NOTE — Chronic Care Management (AMB) (Signed)
?  Chronic Care Management  ? ?Note ? ?12/07/2021 ?Name: Yonathan Perrow MRN: [id number] DOB: 18-Jun-1941 ? ?Buck Mcaffee is a 81 y.o. year old male who is a primary care patient of Vivi Barrack, MD. I reached out to Timoteo Ace by phone today in response to a referral sent by Mr. Herbie Baltimore Giusto's PCP, Vivi Barrack, MD.  ? ?Mr. Yeakle was given information about Chronic Care Management services today including:  ?CCM service includes personalized support from designated clinical staff supervised by his physician, including individualized plan of care and coordination with other care providers ?24/7 contact phone numbers for assistance for urgent and routine care needs. ?Service will only be billed when office clinical staff spend 20 minutes or more in a month to coordinate care. ?Only one practitioner may furnish and bill the service in a calendar month. ?The patient may stop CCM services at any time (effective at the end of the month) by phone call to the office staff. ? ? ?Patient agreed to services and verbal consent obtained.  ? ?Follow up plan: ? ? ?Tatjana Dellinger ?Upstream Scheduler  ?

## 2021-12-11 ENCOUNTER — Other Ambulatory Visit (HOSPITAL_COMMUNITY): Payer: Self-pay

## 2021-12-11 NOTE — Telephone Encounter (Signed)
[facility]ed [facility] to check on status and rep put in a request to have Medicare B added to patients profile.  Once added they will reach out to the patient to set up shipment.  I will follow back up in a couple days to check status. ? ?Dennison Nancy CPHT ?Specialty Pharmacy Patient Advocate ?[place] ?Phone [phone] ?Fax [phone] ?12/11/2021 3:41 PM ? ?

## 2021-12-14 NOTE — Telephone Encounter (Signed)
Called [facility] to check the status of patients prescription.  Rep states that delivery has been scheduled for 12/18/21. ? ?Dennison Nancy CPHT ?Specialty Pharmacy Patient Advocate ?[place] ?Phone [phone] ?Fax [phone] ?12/14/2021 10:30 AM ? ?

## 2021-12-16 ENCOUNTER — Other Ambulatory Visit: Payer: Self-pay | Admitting: Oncology

## 2021-12-18 ENCOUNTER — Encounter: Payer: Self-pay | Admitting: Nurse Practitioner

## 2021-12-18 ENCOUNTER — Other Ambulatory Visit (HOSPITAL_COMMUNITY): Payer: Self-pay

## 2021-12-18 ENCOUNTER — Inpatient Hospital Stay (HOSPITAL_BASED_OUTPATIENT_CLINIC_OR_DEPARTMENT_OTHER): Payer: Medicare Other | Admitting: Nurse Practitioner

## 2021-12-18 ENCOUNTER — Ambulatory Visit: Payer: Medicare Other

## 2021-12-18 ENCOUNTER — Inpatient Hospital Stay: Payer: Medicare Other

## 2021-12-18 ENCOUNTER — Other Ambulatory Visit: Payer: Self-pay

## 2021-12-18 VITALS — BP 153/78 | HR 64 | Temp 97.8°F | Resp 20 | Ht 69.0 in | Wt 170.8 lb

## 2021-12-18 DIAGNOSIS — C20 Malignant neoplasm of rectum: Secondary | ICD-10-CM

## 2021-12-18 DIAGNOSIS — Z5111 Encounter for antineoplastic chemotherapy: Secondary | ICD-10-CM | POA: Diagnosis not present

## 2021-12-18 LAB — CMP (CANCER CENTER ONLY)
ALT: 10 U/L (ref 0–44)
AST: 13 U/L — ABNORMAL LOW (ref 15–41)
Albumin: 4 g/dL (ref 3.5–5.0)
Alkaline Phosphatase: 61 U/L (ref 38–126)
Anion gap: 8 (ref 5–15)
BUN: 17 mg/dL (ref 8–23)
CO2: 25 mmol/L (ref 22–32)
Calcium: 9.6 mg/dL (ref 8.9–10.3)
Chloride: 108 mmol/L (ref 98–111)
Creatinine: 0.99 mg/dL (ref 0.61–1.24)
GFR, Estimated: >60 mL/min (ref >60)
Glucose, Bld: 105 mg/dL — ABNORMAL HIGH (ref 70–99)
Potassium: 3.8 mmol/L (ref 3.5–5.1)
Sodium: 141 mmol/L (ref 135–145)
Total Bilirubin: 0.7 mg/dL (ref 0.3–1.2)
Total Protein: 6 g/dL — ABNORMAL LOW (ref 6.5–8.1)

## 2021-12-18 LAB — CBC WITH DIFFERENTIAL (CANCER CENTER ONLY)
Abs Immature Granulocytes: 0.02 10*3/uL (ref 0.00–0.07)
Basophils Absolute: 0 10*3/uL (ref 0.0–0.1)
Basophils Relative: 1 %
Eosinophils Absolute: 0.2 10*3/uL (ref 0.0–0.5)
Eosinophils Relative: 6 %
HCT: 34.5 % — ABNORMAL LOW (ref 39.0–52.0)
Hemoglobin: 12.1 g/dL — ABNORMAL LOW (ref 13.0–17.0)
Immature Granulocytes: 1 %
Lymphocytes Relative: 19 %
Lymphs Abs: 0.7 10*3/uL (ref 0.7–4.0)
MCH: 30.4 pg (ref 26.0–34.0)
MCHC: 35.1 g/dL (ref 30.0–36.0)
MCV: 86.7 fL (ref 80.0–100.0)
Monocytes Absolute: 0.4 10*3/uL (ref 0.1–1.0)
Monocytes Relative: 10 %
Neutro Abs: 2.4 10*3/uL (ref 1.7–7.7)
Neutrophils Relative %: 63 %
Platelet Count: 153 10*3/uL (ref 150–400)
RBC: 3.98 MIL/uL — ABNORMAL LOW (ref 4.22–5.81)
RDW: 15.9 % — ABNORMAL HIGH (ref 11.5–15.5)
WBC Count: 3.7 10*3/uL — ABNORMAL LOW (ref 4.0–10.5)
nRBC: 0 % (ref 0.0–0.2)

## 2021-12-18 NOTE — Patient Instructions (Signed)

## 2021-12-18 NOTE — Telephone Encounter (Signed)
Oral Chemotherapy Pharmacist Encounter ? ?Neil Perry's capecitabine will be delivered by [facility] today 12/18/21. He plans on starting his capecitabine tomorrow 12/19/21.  ? ?Patient Education ?I spoke with patient for overview of new oral chemotherapy medication: Xeloda (capecitabine) for maintenance therapy of rectal cancer, planned duration until disease progression or unacceptable drug toxicity. MD would like for the patient to start on 12/18/21.  ? ?Pt is doing well. Counseled patient on administration, dosing, side effects, monitoring, drug-food interactions, safe handling, storage, and disposal. ?Patient will take 3 tablets (1,500 mg total) by mouth 2 (two) times daily after a meal. ? ?Side effects include but not limited to: diarrhea, hand-foot syndrome, mouth sores, edema, decreased wbc, fatigue, N/V ?Diarrhea: patient has had experience using loperamide in the past, asked him to make sure he has some on hand to use if needed. Reminded him to stay hydrated if diarrhea occurs.  ?Hand-foot syndrome: Recommended the use of [place] Smooth Extra Care 20 ?Mouth sores: patient will call for Magic Mouthwash if needed ?   ?Reviewed with patient importance of keeping a medication schedule and plan for any missed doses. ? ?After discussion with patient no patient barriers to medication adherence identified.  ? ?Mr. Dinh voiced understanding and appreciation. All questions answered. Medication handout provided. ? ?Provided patient with [facility] [place] [facility] phone number. Patient knows to call the office with questions or concerns. Oral Chemotherapy Navigation Clinic will continue to follow. ? ?Darl Pikes, PharmD, BCPS, BCOP, CPP ?Hematology/Oncology Clinical Pharmacist Practitioner ?[place]/DB/AP Oral Chemotherapy Navigation Clinic ?[phone] ? ?12/18/2021 12:50 PM ? ?

## 2021-12-18 NOTE — Progress Notes (Signed)
?[place] ?OFFICE PROGRESS NOTE ? ? ?Diagnosis: Rectal cancer ? ?INTERVAL HISTORY:  ? ?[name] returns as scheduled.  He completed another cycle of 5-fluorouracil 12/04/2021.  He is scheduled to begin maintenance Xeloda.  He overall is feeling well.  He noted some cracking at the fingernail beds last week, better now.  No nausea or vomiting.  No mouth sores.  He had 4 loose stools about a week ago, none since. ? ?Objective: ? ?Vital signs in last 24 hours: ? ?Blood pressure (!) 153/78, pulse 64, temperature 97.8 ?F (36.6 ?C), temperature source Oral, resp. rate 20, height _0  (1.753 m), weight 170 lb 12.8 oz (77.5 kg), SpO2 100 %. ?  ? ?HEENT: No thrush or ulcers. ?Resp: Lungs clear bilaterally. ?Cardio: Regular rate and rhythm. ?GI: Abdomen soft and nontender.  No hepatomegaly. ?Vascular: No leg edema. ?Skin: Palms with mild hyperpigmentation, no erythema or skin breakdown. ?Port-A-Cath without erythema. ? ? ?Lab Results: ? ?Lab Results  ?Component Value Date  ? WBC 3.7 (L) 12/18/2021  ? HGB 12.1 (L) 12/18/2021  ? HCT 34.5 (L) 12/18/2021  ? MCV 86.7 12/18/2021  ? PLT 153 12/18/2021  ? NEUTROABS 2.4 12/18/2021  ? ? ?Imaging: ? ?No results found. ? ?Medications: I have reviewed the patient's current medications. ? ?Assessment/Plan: ?Rectal cancer ?MRI abdomen 04/26/2019 2-3 new hypervascular masses in the liver dome, no abdominal lymphadenopathy, stable benign left adrenal adenoma, stable right lower pole kidney mass ?Ultrasound-guided biopsy of a liver lesion 05/07/2021-metastatic adenocarcinoma with extensive necrosis, immunohistochemical profile consistent with a colorectal primary; foundation 1-microsatellite stable, tumor mutation burden 5, K-rasG 12V, NRAS wildtype ?Colonoscopy 05/22/2021-ulcerated partially obstructing mass at 15 cm from anal verge ?CTs 05/30/2021-numerous small pulmonary nodules concerning for metastases, thickening of the rectum, multiple liver metastases ?Cycle 1 FOLFOX  06/12/2021 ?Cycle 2 FOLFOX 06/26/2021 ?Cycle 3 FOLFOX 07/10/2021, oxaliplatin dose reduced due to progressive decline in the [place] and platelet count ?Cycle 4 FOLFOX 07/24/2021 ?Cycle 5 FOLFOX 08/07/2021, 5-FU bolus eliminated and oxaliplatin dose reduced, insurance would not approve Udenyca ?CTs 08/15/2021-decrease size of lung nodules, decreased hepatic metastases, persistent anorectal wall thickening, stable right renal mass ?Cycle 6 FOLFOX 08/21/2021, 5-FU bolus and 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 ?Cycle 7 FOLFOX 09/04/2021, 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 ?Cycle 8 FOLFOX 09/18/2021, 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 ?Cycle 9 FOLFOX 10/08/2021, 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 ?Cycle 10 FOLFOX 10/23/2021, 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 ?CTs 11/01/2021-decrease in size of occasional small pulmonary nodules, continued decrease in size of multiple hypoenhancing hepatic metastases, unchanged posttreatment appearance of the low rectum, unchanged exophytic mass of the inferior pole of the right kidney measuring 1.5 x 1.3 cm ?Cycle 11 5-fluorouracil 11/06/2021 ?Cycle 12 5-fluorouracil 11/20/2021 ?Cycle 13 5-fluorouracil 12/04/2021 ?Maintenance Xeloda beginning 12/19/2021 ?Prostate cancer-simple prostatectomy January 2019, Gleason 3+3, 10 to 12% of specimen ?Active surveillance, biopsy May 2019 with small focus of Gleason 3+3 disease in 1/6 cores, surveillance continued ?Elevated PSA 04/20/2020 ?Biopsy 06/19/2020 8/12 cores positive, Gleason 4+4 ?PET scan 07/07/2020-negative ?CT 07/07/2020 right iliac and retrocaval adenopathy, 1.3 cm subcapsular right lower pole renal lesion ?Androgen deprivation therapy beginning 08/21/2020 ?Radiation to prostate, seminal vesicles, and pelvic lymph nodes to 10/22 - 12/28/2020 ?He continues every 40-monthFirmagon and daily Xtandi ?  ?3.  Right renal mass consistent with a renal cell carcinoma-stable on  MRI abdomen 04/25/2021, stable on CT 08/15/2021 ?4.  Mitral valve prolapse ?5.  Family history of prostate cancer ?6.  08/15/2021 with leg weakness and an episode of slurred speech-TIA?,  Brain imaging negative ?  ? ?Disposition: Mr. [name] appears stable.  He most recently completed a cycle of 5-fluorouracil 12/04/2021.  He is seen today prior to beginning oral maintenance therapy with Xeloda.  We again reviewed potential toxicities.  He agrees to proceed.  He will begin Xeloda 12/19/2021 AM. ? ?CBC from today reviewed.  Labs adequate to begin Xeloda as above. ? ?He will return for lab/flush and follow-up in 3 weeks.  We are available to see him sooner if needed. ? ? ?Ned Card ANP/GNP-BC  ? ?12/18/2021  ?10:05 AM ? ? ? ? ? ? ? ?

## 2021-12-26 ENCOUNTER — Encounter: Payer: Self-pay | Admitting: *Deleted

## 2021-12-26 ENCOUNTER — Other Ambulatory Visit: Payer: Self-pay | Admitting: Oncology

## 2021-12-26 DIAGNOSIS — C20 Malignant neoplasm of rectum: Secondary | ICD-10-CM

## 2021-12-26 NOTE — Progress Notes (Signed)
PATIENT NAVIGATOR PROGRESS NOTE ? ?Name: Neil Perry ?Date: 12/26/2021 ?MRN: [id number]  ?DOB: 06/01/1941 ? ? ?Reason for visit:  ?Telephone call ? ?Comments:  Patient called to report that since starting Capecitabine on 3/30 he has had two long bouts of abdominal cramping with alternating loose stool and diarrhea with each episode coinciding with nausea and vomiting.  Also reports decreased appetite. ? ?Per Dr [name] instructed pt to stop Capecitabine and he will come in tomorrow for evaluation.  ?Pt verbalized understanding ? ? ? ?Time spent counseling/coordinating care: 15-30 minutes ? ?

## 2021-12-27 ENCOUNTER — Inpatient Hospital Stay: Payer: Medicare Other | Attending: Nurse Practitioner | Admitting: Oncology

## 2021-12-27 ENCOUNTER — Telehealth: Payer: Self-pay | Admitting: Pharmacist

## 2021-12-27 VITALS — BP 138/69 | HR 60 | Temp 98.2°F | Resp 20 | Ht 69.0 in | Wt 166.8 lb

## 2021-12-27 DIAGNOSIS — C20 Malignant neoplasm of rectum: Secondary | ICD-10-CM | POA: Diagnosis present

## 2021-12-27 DIAGNOSIS — C61 Malignant neoplasm of prostate: Secondary | ICD-10-CM | POA: Diagnosis not present

## 2021-12-27 DIAGNOSIS — I341 Nonrheumatic mitral (valve) prolapse: Secondary | ICD-10-CM | POA: Diagnosis not present

## 2021-12-27 DIAGNOSIS — N2889 Other specified disorders of kidney and ureter: Secondary | ICD-10-CM | POA: Diagnosis not present

## 2021-12-27 DIAGNOSIS — Z9221 Personal history of antineoplastic chemotherapy: Secondary | ICD-10-CM | POA: Diagnosis not present

## 2021-12-27 DIAGNOSIS — R5383 Other fatigue: Secondary | ICD-10-CM | POA: Diagnosis not present

## 2021-12-27 DIAGNOSIS — C787 Secondary malignant neoplasm of liver and intrahepatic bile duct: Secondary | ICD-10-CM | POA: Insufficient documentation

## 2021-12-27 DIAGNOSIS — Z8042 Family history of malignant neoplasm of prostate: Secondary | ICD-10-CM | POA: Insufficient documentation

## 2021-12-27 DIAGNOSIS — Z923 Personal history of irradiation: Secondary | ICD-10-CM | POA: Insufficient documentation

## 2021-12-27 NOTE — Progress Notes (Signed)
?[place] ?OFFICE PROGRESS NOTE ? ? ?Diagnosis: Rectal cancer ? ?INTERVAL HISTORY:  ? ?Neil Neil Perry returns for an unscheduled visit.  He began Xeloda maintenance on 12/19/2021.  On 12/23/2021 he had cramping abdominal pain and an episode of nausea and vomiting.  He had nausea again on 12/25/2021.  He reports having 2 bowel movements a day, alternating diarrhea and formed stool, on 12/23/2021 - 12/25/2021.  He last had a bowel movement on 12/25/2021.  This was formed.  No dyspnea or chest pain.  He discontinued capecitabine after the morning dose on 12/26/2021. ? ?He feels well today.  No mouth sores or hand/foot pain. ? ?Objective: ? ?Vital signs in last 24 hours: ? ?Blood pressure 138/69, pulse 60, temperature 98.2 ?F (36.8 ?C), temperature source Oral, resp. rate 20, height _0  (1.753 m), weight 166 lb 12.8 oz (75.7 kg), SpO2 99 %. ?  ? ?HEENT: No thrush or ulcers ?Resp: Lungs clear bilaterally ?Cardio: Regular rate and rhythm ?GI: Soft, mild tenderness in the right lower abdomen, no mass, no hepatosplenomegaly ?Vascular: No leg edema  ?Skin: Mild hyperpigmentation of the hands, no erythema ? ?Portacath/PICC-without erythema ? ?Lab Results: ? ?Lab Results  ?Component Value Date  ? WBC 3.7 (L) 12/18/2021  ? HGB 12.1 (L) 12/18/2021  ? HCT 34.5 (L) 12/18/2021  ? MCV 86.7 12/18/2021  ? PLT 153 12/18/2021  ? NEUTROABS 2.4 12/18/2021  ? ? ?CMP  ?Lab Results  ?Component Value Date  ? NA 141 12/18/2021  ? K 3.8 12/18/2021  ? CL 108 12/18/2021  ? CO2 25 12/18/2021  ? GLUCOSE 105 (H) 12/18/2021  ? BUN 17 12/18/2021  ? CREATININE 0.99 12/18/2021  ? CALCIUM 9.6 12/18/2021  ? PROT 6.0 (L) 12/18/2021  ? ALBUMIN 4.0 12/18/2021  ? AST 13 (L) 12/18/2021  ? ALT 10 12/18/2021  ? ALKPHOS 61 12/18/2021  ? BILITOT 0.7 12/18/2021  ? GFRNONAA >60 12/18/2021  ? ? ?Lab Results  ?Component Value Date  ? CEA 2.32 12/04/2021  ? ? ? ? ?Medications: I have reviewed the patient's current medications. ? ? ?Assessment/Plan: ?Rectal  cancer ?MRI abdomen 04/26/2019 2-3 new hypervascular masses in the liver dome, no abdominal lymphadenopathy, stable benign left adrenal adenoma, stable right lower pole kidney mass ?Ultrasound-guided biopsy of a liver lesion 05/07/2021-metastatic adenocarcinoma with extensive necrosis, immunohistochemical profile consistent with a colorectal primary; foundation 1-microsatellite stable, tumor mutation burden 5, K-rasG 12V, NRAS wildtype ?Colonoscopy 05/22/2021-ulcerated partially obstructing mass at 15 cm from anal verge ?CTs 05/30/2021-numerous small pulmonary nodules concerning for metastases, thickening of the rectum, multiple liver metastases ?Cycle 1 FOLFOX 06/12/2021 ?Cycle 2 FOLFOX 06/26/2021 ?Cycle 3 FOLFOX 07/10/2021, oxaliplatin dose reduced due to progressive decline in the [place] and platelet count ?Cycle 4 FOLFOX 07/24/2021 ?Cycle 5 FOLFOX 08/07/2021, 5-FU bolus eliminated and oxaliplatin dose reduced, insurance would not approve Udenyca ?CTs 08/15/2021-decrease size of lung nodules, decreased hepatic metastases, persistent anorectal wall thickening, stable right renal mass ?Cycle 6 FOLFOX 08/21/2021, 5-FU bolus and 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 ?Cycle 7 FOLFOX 09/04/2021, 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 ?Cycle 8 FOLFOX 09/18/2021, 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 ?Cycle 9 FOLFOX 10/08/2021, 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 ?Cycle 10 FOLFOX 10/23/2021, 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 ?CTs 11/01/2021-decrease in size of occasional small pulmonary nodules, continued decrease in size of multiple hypoenhancing hepatic metastases, unchanged posttreatment appearance of the low rectum, unchanged exophytic mass of the inferior pole of the right kidney measuring 1.5 x 1.3  cm ?Cycle 11 5-fluorouracil 11/06/2021 ?Cycle 12 5-fluorouracil 11/20/2021 ?Cycle 13 5-fluorouracil 12/04/2021 ?Maintenance Xeloda beginning 12/19/2021 ?Prostate  cancer-simple prostatectomy January 2019, Gleason 3+3, 10 to 12% of specimen ?Active surveillance, biopsy May 2019 with small focus of Gleason 3+3 disease in 1/6 cores, surveillance continued ?Elevated PSA 04/20/2020 ?Biopsy 06/19/2020 8/12 cores positive, Gleason 4+4 ?PET scan 07/07/2020-negative ?CT 07/07/2020 right iliac and retrocaval adenopathy, 1.3 cm subcapsular right lower pole renal lesion ?Androgen deprivation therapy beginning 08/21/2020 ?Radiation to prostate, seminal vesicles, and pelvic lymph nodes to 10/22 - 12/28/2020 ?He continues every 85-monthFirmagon and daily Xtandi ?  ?3.  Right renal mass consistent with a renal cell carcinoma-stable on MRI abdomen 04/25/2021, stable on CT 08/15/2021 ?4.  Mitral valve prolapse ?5.  Family history of prostate cancer ?6.  08/15/2021 with leg weakness and an episode of slurred speech-TIA?,  Brain imaging negative ?  ? ? ? 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  He began Xeloda maintenance on 12/19/2021.  On 12/23/2021 he noted the onset of cramping abdominal pain and nausea.  He discontinued Xeloda 12/26/2021.  He feels well today.  It is unclear whether his symptoms were related to Xeloda or another etiology.  He does not appear to have a bowel obstruction. ?I discussed the differential diagnosis with Neil. REastridgeand his wife.  He will resume Xeloda this evening at a reduced dose.  He will hold Xeloda and call for recurrent symptoms. ? ?He will return as scheduled on 01/08/2022. ? ?GBetsy Coder MD ? ?12/27/2021  ?3:52 PM ? ? ?

## 2021-12-27 NOTE — Progress Notes (Signed)
? ? ?Chronic Care Management ?Pharmacy Assistant  ? ?Name: Neil Perry  MRN: [id number] DOB: 1941/06/14 ? ? ?Reason for Encounter: Chart Review For Initial Visit With Clinical Pharmacist ?  ?Conditions to be addressed/monitored: ?HTN, CKD, Rectal cancer, Hyperglycemia, HLD ? ?Primary concerns for visit include: ?HTN, Rectal cancer  ? ?Recent office visits:  ?None ? ?Recent consult visits:  ?12/18/2021 OV (Oncology) Owens Shark, NP; He will begin Xeloda 12/19/2021 AM. ? ?12/04/2021 OV (Oncology) Ladell Pier, MD; Cycle 13 5-fluorouracil 12/04/2021 ? ?11/20/2021 OV (Oncology) Ladell Pier, MD; Cycle 12 5-fluorouracil 11/20/2021 ? ?11/06/2021 OV (Oncology) Ladell Pier, MD; Cycle 11 5-fluorouracil 11/06/2021 ? ?10/23/2021 OV (Oncology) Owens Shark, MD; Cycle 10 5-fluorouracil 10/23/2021 ? ?10/08/2021 OV (Oncology) Ladell Pier, MD; Cycle 9 5-fluorouracil 10/08/2021, Oxaliplatin will remain on hold secondary to neuropathy symptoms. ? ?09/27/2021 OV (Sports Medicine) Lyndal Pulley, DO; Zinc 30 mg daily ? ?09/18/2021 OV (Oncology) Ladell Pier, MD; Cycle 8 5-fluorouracil 10/08/2021, Oxaliplatin will remain on hold secondary to neuropathy symptoms. ? ?09/04/2021 OV (Oncology) Ladell Pier, MD; He continues to have neuropathy symptoms.  Oxaliplatin will be held from the chemotherapy regimen again today.  He will complete a cycle of 5-FU/leucovorin today. ? 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  It is possible the leg symptoms he experienced last week were related to oxaliplatin neuropathy.  He reports increased malaise.  We decided to hold oxaliplatin with this cycle of chemotherapy.  He will complete a cycle of infusional 5-FU today ? ?Hospital visits:  ?08/15/2021 ED to Hospital Admission due to TIA at [facility] ?[place] date: 08/15/2021 ?Discharge date: 08/16/2021  ?STOP taking these medications ?Ondansetron 8  mg ?[employer] COVID-19 Vac ?Prochlorperazine 10 mg ? ? ?Medications: ?Outpatient Encounter Medications as of 12/27/2021  ?Medication Sig  ? amLODipine (NORVASC) 5 MG tablet TAKE 1 TABLET BY MOUTH EVERY DAY  ? aspirin EC 81 MG EC tablet Take 1 tablet (81 mg total) by mouth daily. Swallow whole.  ? capecitabine (XELODA) 500 MG tablet Take 3 tablets (1,500 mg total) by mouth 2 (two) times daily after a meal.  ? clotrimazole-betamethasone (LOTRISONE) cream Apply topically 2 (two) times daily.  ? docusate sodium (COLACE) 100 MG capsule Take 100 mg by mouth daily.  ? enzalutamide (XTANDI) 40 MG tablet Take 80 mg by mouth daily.  ? lidocaine-prilocaine (EMLA) cream Apply 1 application topically as directed. Apply 1/2 tablespoon to port site 2 hours prior to stick and cover with Press and Seal to numb site  ? Melatonin 3 MG CAPS Take 3 mg by mouth at bedtime as needed (sleep).  ? ?No facility-administered encounter medications on file as of 12/27/2021.  ? ?Current Medications: ?Ondansetron 8 mg last filled 06/08/2021 10 DS ?Prochlorperazine 10 mg last filled 09/16/222 15 DS not taking per patient ?Cepecitabine 500 mg ?Docusate sodium ?Aspirin 81 mg ?Lotrisone cream last filled 12/17/2021 ?Amlodipine 5 mg last filled 12/17/2021 ?Lidocaine-prilocaine last filled 06/08/2021 30 DS ?Melatonin 3 mg ?Enzalutamide 40 mg last filled 12/04/2021 30 DS ? ?Patient Questions: ?Any changes in your medications or health? ?Patient states he stopped his infusion chemo and started on oral chemotherapy 1 week ago. ? ?Any side effects from any medications?  ?Patient states his oral chemo causes him to have intestinal pain, nausea and vomiting. ? ?Do you have any symptoms or problems not managed by your medications? ?Patient denies having any symptoms or problems that is  not currently managed by his medications. ? ?Any concerns about your health right now? ?Patient states he would like to be cured from cancer. ? ?Has your provider asked that you check  blood pressure, blood sugar, or follow special diet at home? ?Patient states he checks his blood pressure occasionally. He does not follow any special diet. ? ?Do you get any type of exercise on a regular basis? ?Patient states he has been "wobbly in the legs" so he hasn't been able to exercise. ? ?Can you think of a goal you would like to reach for your health? ?"I would like to be cured of cancer." ? ?Do you have any problems getting your medications? ?Patient states he does not have any problems getting his medications. ? ?Is there anything that you would like to discuss during the appointment?  ?Patient states he would like to discuss his medications, and ensure that he is taking the correct medications appropriately. ? ?Please bring medications and supplements to appointment ? ?Care Gaps: ?Medicare Annual Wellness: Last AWV 01/18/2020 ?Hemoglobin A1C: 4.4% on 08/16/2021 ?Colonoscopy: Completed 05/22/2021 ? ?Future Appointments  ?Date Time Provider [place]  ?01/01/2022 11:00 AM [facility] CCM PHARMACIST [facility]  ?01/08/2022  1:00 PM [facility] PHLEBOTOMIST [facility] None  ?01/08/2022  1:15 PM [facility] FLUSH ROOM [facility] None  ?01/08/2022  1:45 PM Owens Shark, NP [facility] None  ? ?Star Rating Drugs: ?None ? ?April D Calhoun, [place] ?Clinical Pharmacist Assistant ?[phone] ? ?

## 2021-12-27 NOTE — Progress Notes (Signed)
? ?Chronic Care Management ?Pharmacy Note ? ?01/01/2022 ?Name:  Neil Perry MRN:  [id number] DOB:  1940-11-05 ? ?Summary: ?Initial visit with PharmD.  BP has been elevated past few office visits.  Denies any symptoms at home.  Also mentions shakiness in legs after sitting down for long periods.  Denies dizziness associated with this.  Also having some diarrhea since switching over to oral chemo. ? ?Recommendations/Changes made from today's visit: ?Monitor BP at home - consider increased dose of amlodipine if needed ? ?Plan: ?FU 1 year PharmD ?BP check in 30 days ? ? ?Subjective: ?Neil Perry is an 81 y.o. year old male who is a primary patient of Jerline Pain, Algis Greenhouse, MD.  The CCM team was consulted for assistance with disease management and care coordination needs.   ? ?Engaged with patient face to face for initial visit in response to provider referral for pharmacy case management and/or care coordination services.  ? ?Consent to Services:  ?The patient was given the following information about Chronic Care Management services today, agreed to services, and gave verbal consent: 1. CCM service includes personalized support from designated clinical staff supervised by the primary care provider, including individualized plan of care and coordination with other care providers 2. 24/7 contact phone numbers for assistance for urgent and routine care needs. 3. Service will only be billed when office clinical staff spend 20 minutes or more in a month to coordinate care. 4. Only one practitioner may furnish and bill the service in a calendar month. 5.The patient may stop CCM services at any time (effective at the end of the month) by phone call to the office staff. 6. The patient will be responsible for cost sharing (co-pay) of up to 20% of the service fee (after annual deductible is met). Patient agreed to services and consent obtained. ? ?Patient Care Team: ?Vivi Barrack, MD as PCP - General (Family Medicine) ?Cira Rue, RN Nurse Navigator as Registered Nurse (Medical Oncology) ?Lin Givens, RN as Oncology Nurse Navigator ?Edythe Clarity, [facility] as Pharmacist (Pharmacist) ? ?Recent office visits:  ?None ?  ?Recent consult visits:  ?12/18/2021 OV (Oncology) Owens Shark, NP; He will begin Xeloda 12/19/2021 AM. ?  ?12/04/2021 OV (Oncology) Ladell Pier, MD; Cycle 13 5-fluorouracil 12/04/2021 ?  ?11/20/2021 OV (Oncology) Ladell Pier, MD; Cycle 12 5-fluorouracil 11/20/2021 ?  ?11/06/2021 OV (Oncology) Ladell Pier, MD; Cycle 11 5-fluorouracil 11/06/2021 ?  ?10/23/2021 OV (Oncology) Owens Shark, MD; Cycle 10 5-fluorouracil 10/23/2021 ?  ?10/08/2021 OV (Oncology) Ladell Pier, MD; Cycle 9 5-fluorouracil 10/08/2021, Oxaliplatin will remain on hold secondary to neuropathy symptoms. ?  ?09/27/2021 OV (Sports Medicine) Lyndal Pulley, DO; Zinc 30 mg daily ?  ?09/18/2021 OV (Oncology) Ladell Pier, MD; Cycle 8 5-fluorouracil 10/08/2021, Oxaliplatin will remain on hold secondary to neuropathy symptoms. ?  ?09/04/2021 OV (Oncology) Ladell Pier, MD; He continues to have neuropathy symptoms.  Oxaliplatin will be held from the chemotherapy regimen again today.  He will complete a cycle of 5-FU/leucovorin today. ?  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  It is possible the leg symptoms he experienced last week were related to oxaliplatin neuropathy.  He reports increased malaise.  We decided to hold oxaliplatin with this cycle of chemotherapy.  He will complete a cycle of infusional 5-FU today ?  ?Hospital visits:  ?08/15/2021 ED to Hospital Admission due to TIA at [facility] ?[place]  date: 08/15/2021 ?Discharge date: 08/16/2021  ?STOP taking these medications ?Ondansetron 8 mg ?Pfizer COVID-19 Vac ?Prochlorperazine 10 mg ? ? ?Objective: ? ?Lab Results  ?Component Value Date  ? CREATININE 0.99 12/18/2021  ? BUN 17 12/18/2021  ? GFRNONAA  >60 12/18/2021  ? NA 141 12/18/2021  ? K 3.8 12/18/2021  ? CALCIUM 9.6 12/18/2021  ? CO2 25 12/18/2021  ? GLUCOSE 105 (H) 12/18/2021  ? ? ?Lab Results  ?Component Value Date/Time  ? HGBA1C 4.4 (L) 08/16/2021 06:15 AM  ?  ?Last diabetic Eye exam: No results found for: HMDIABEYEEXA  ?Last diabetic Foot exam: No results found for: HMDIABFOOTEX  ? ?Lab Results  ?Component Value Date  ? CHOL 118 08/16/2021  ? HDL 25 (L) 08/16/2021  ? [place] 51 08/16/2021  ? TRIG 208 (H) 08/16/2021  ? CHOLHDL 4.7 08/16/2021  ? ? ? ?  Latest Ref Rng & Units 12/18/2021  ?  9:30 AM 12/04/2021  ? 10:05 AM 11/20/2021  ?  9:40 AM  ?Hepatic Function  ?Total Protein 6.5 - 8.1 g/dL 6.0   5.9   5.9    ?Albumin 3.5 - 5.0 g/dL 4.0   4.0   4.2    ?AST 15 - 41 U/L _0 ?ALT 0 - 44 U/L _1 ?Alk Phosphatase 38 - 126 U/L 61   64   64    ?Total Bilirubin 0.3 - 1.2 mg/dL 0.7   0.7   0.7    ? ? ?Lab Results  ?Component Value Date/Time  ? TSH 3.53 09/27/2021 02:49 PM  ? FREET4 0.91 09/27/2021 02:49 PM  ? ? ? ?  Latest Ref Rng & Units 12/18/2021  ?  9:30 AM 12/04/2021  ? 10:05 AM 11/20/2021  ?  9:40 AM  ?CBC  ?WBC 4.0 - 10.5 K/uL 3.7   3.3   3.3    ?Hemoglobin 13.0 - 17.0 g/dL 12.1   12.3   12.5    ?Hematocrit 39.0 - 52.0 % 34.5   34.6   35.1    ?Platelets 150 - 400 K/uL 153   150   154    ? ? ?Lab Results  ?Component Value Date/Time  ? VD25OH 42.04 09/27/2021 02:49 PM  ? ? ?Clinical ASCVD: No  ?The ASCVD Risk score (Arnett DK, et al., 2019) failed to calculate for the following reasons: ?  The 2019 ASCVD risk score is only valid for ages 56 to 7   ? ? ?  04/20/2021  ?  3:36 PM 01/18/2020  ?  2:12 PM 12/16/2019  ?  1:30 PM  ?Depression screen PHQ 2/9  ?Decreased Interest 0 0 0  ?Down, Depressed, Hopeless 0 0 0  ?PHQ - 2 Score 0 0 0  ?  ? ? ? ?Social History  ? ?Tobacco Use  ?Smoking Status Never  ?Smokeless Tobacco Never  ? ?BP Readings from Last 3 Encounters:  ?12/27/21 138/69  ?12/18/21 (!) 153/78  ?12/06/21 (!) 142/73  ? ?Pulse Readings from  Last 3 Encounters:  ?12/27/21 60  ?12/18/21 64  ?12/06/21 60  ? ?Wt Readings from Last 3 Encounters:  ?12/27/21 166 lb 12.8 oz (75.7 kg)  ?12/18/21 170 lb 12.8 oz (77.5 kg)  ?12/04/21 169 lb 12.8 oz (77 kg)  ? ?BMI Readings from Last 3 Encounters:  ?12/27/21 24.63 kg/m?  ?12/18/21 25.22 kg/m?  ?12/04/21 25.08 kg/m?  ? ? ?Assessment/Interventions: Review  of patient past medical history, allergies, medications, health status, including review of consultants reports, laboratory and other test data, was performed as part of comprehensive evaluation and provision of chronic care management services.  ? ?SDOH:  (Social Determinants of Health) assessments and interventions performed: Yes ? ?Financial Resource Strain: Low Risk   ? Difficulty of Paying Living Expenses: Not hard at all  ? ?Food Insecurity: No Food Insecurity  ? Worried About Charity fundraiser in the Last Year: Never true  ? Ran Out of Food in the Last Year: Never true  ? ? ?SDOH Screenings  ? ?Alcohol Screen: Low Risk   ? Last Alcohol Screening Score (AUDIT): 0  ?Depression (PHQ2-9): Low Risk   ? PHQ-2 Score: 0  ?Financial Resource Strain: Low Risk   ? Difficulty of Paying Living Expenses: Not hard at all  ?Food Insecurity: No Food Insecurity  ? Worried About Charity fundraiser in the Last Year: Never true  ? Ran Out of Food in the Last Year: Never true  ?Housing: Low Risk   ? Last Housing Risk Score: 0  ?Physical Activity: Insufficiently Active  ? Days of Exercise per Week: 2 days  ? Minutes of Exercise per Session: 30 min  ?Social Connections: Moderately Isolated  ? Frequency of Communication with Friends and Family: Twice a week  ? Frequency of Social Gatherings with Friends and Family: Twice a week  ? Attends Religious Services: Never  ? Active Member of Clubs or Organizations: No  ? Attends Archivist Meetings: Never  ? Marital Status: Married  ?Stress: No Stress Concern Present  ? Feeling of Stress : Only a little  ?Tobacco Use: Low Risk    ? Smoking Tobacco Use: Never  ? Smokeless Tobacco Use: Never  ? Passive Exposure: Not on file  ?Transportation Needs: No Transportation Needs  ? Lack of Transportation (Medical): No  ? Lack of Transport

## 2021-12-28 NOTE — Telephone Encounter (Signed)
When refill sent in, decrease dose to 1000 mg bid ?

## 2022-01-01 ENCOUNTER — Ambulatory Visit (INDEPENDENT_AMBULATORY_CARE_PROVIDER_SITE_OTHER): Payer: Medicare Other | Admitting: Pharmacist

## 2022-01-01 DIAGNOSIS — I10 Essential (primary) hypertension: Secondary | ICD-10-CM

## 2022-01-01 DIAGNOSIS — C20 Malignant neoplasm of rectum: Secondary | ICD-10-CM

## 2022-01-01 DIAGNOSIS — E782 Mixed hyperlipidemia: Secondary | ICD-10-CM

## 2022-01-01 NOTE — Patient Instructions (Addendum)
Visit Information ? ? Goals Addressed   ? ?  ?  ?  ?  ? This Visit's Progress  ?  Track and Manage My Blood Pressure-Hypertension     ?  Timeframe:  Long-Range Goal ?Priority:  High ?Start Date:     01/01/22                        ?Expected End Date:   07/03/22                   ? ?Follow Up Date 04/02/22  ?  ?- check blood pressure weekly ?- choose a place to take my blood pressure (home, clinic or office, retail store) ?- write blood pressure results in a log or diary  ?  ?Why is this important?   ?You won't feel high blood pressure, but it can still hurt your blood vessels.  ?High blood pressure can cause heart or kidney problems. It can also cause a stroke.  ?Making lifestyle changes like losing a little weight or eating less salt will help.  ?Checking your blood pressure at home and at different times of the day can help to control blood pressure.  ?If the doctor prescribes medicine remember to take it the way the doctor ordered.  ?Call the office if you cannot afford the medicine or if there are questions about it.   ?  ?Notes:  ?  ? ?  ? ?Patient Care Plan: General Pharmacy (Adult)  ?  ? ?Problem Identified: HTN, CKD, Cancer, Hyperglycemia, HLD   ?Priority: High  ?Onset Date: 01/01/2022  ?  ? ?Long-Range Goal: Patient-Specific Goal   ?Start Date: 01/01/2022  ?Expected End Date: 07/03/2022  ?This Visit's Progress: On track  ?Priority: High  ?Note:   ?Current Barriers:  ?Elevated BP ?Shaking legs after standing ? ?Pharmacist Clinical Goal(s):  ?Patient will achieve improvement in BP as evidenced by monitoring through collaboration with PharmD and provider.  ? ?Interventions: ?1:1 collaboration with Vivi Barrack, MD regarding development and update of comprehensive plan of care as evidenced by provider attestation and co-signature ?Inter-disciplinary care team collaboration (see longitudinal plan of care) ?Comprehensive medication review performed; medication list updated in electronic medical  record ? ?Hypertension (BP goal <130/80) ?-Not ideally controlled ?-Current treatment: ?Amlodipine '5mg'$  Appropriate, Query effective,  ?-Medications previously tried: HCTZ, losartan  ?-Current home readings: not currently checking at home ?-Current dietary habits: consistent diet, does not consume a lot of salt.  Does mention his wife does not think he is currently eating enough ?-Current exercise habits: minimal due to weakness/shaking in legs ?-Denies hypotensive/hypertensive symptoms ?-Educated on BP goals and benefits of medications for prevention of heart attack, stroke and kidney damage; ?Daily salt intake goal < 2300 mg; ?Importance of home blood pressure monitoring; ?Symptoms of hypotension and importance of maintaining adequate hydration; ?-Counseled to monitor BP at home a few times per week, document, and provide log at future appointments ?-Recommended to continue current medication ?Last few office BP have been elevated, will have CMA check in about 30 days as have asked him to record at home and monitor.  Could consider increase to amlodipine '10mg'$  if unable to control BP. ? ?Hyperlipidemia: (LDL goal < 100) ?-Controlled ?-Current treatment: ?None noted ?-Medications previously tried: none  ?-Educated on Cholesterol goals;  ?Importance of limiting foods high in cholesterol; ?-Recommended continue current management, routine screenings ? ?Rectal cancer/Hx of Prostate cancer (Goal: Reduce symptoms, remission) ?-Controlled ?-Current treatment  ?  Xtandi '40mg'$  two tablets daily Appropriate, Effective, Safe, Accessible ?Xeloda '500mg'$  2 tablets twice daily Appropriate, Effective, Safe, Accessible ?Compazine '10mg'$  q6h prn Appropriate, Effective, Safe, Accessible ?-Medications previously tried: none noted ?-Patient is experiencing some shaking in legs when he stands up from sitting for long periods of time.  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  Nausea is occasional and is relieved  by compazine.   ?-Will continue to monitor legs, walks with a cane for stability.  ?-Recommended to continue current medication ?No changes at this time, all are affordable. ? ?Patient Goals/Self-Care Activities ?Patient will:  ?- check blood pressure 2-3 times per week, document, and provide at future appointments ? ?Follow Up Plan: The care management team will reach out to the patient again over the next 365 days.  ?  ? ? ?Neil Perry was given information about Chronic Care Management services today including:  ?CCM service includes personalized support from designated clinical staff supervised by his physician, including individualized plan of care and coordination with other care providers ?24/7 contact phone numbers for assistance for urgent and routine care needs. ?Standard insurance, coinsurance, copays and deductibles apply for chronic care management only during months in which we provide at least 20 minutes of these services. Most insurances cover these services at 100%, however patients may be responsible for any copay, coinsurance and/or deductible if applicable. This service may help you avoid the need for more expensive face-to-face services. ?Only one practitioner may furnish and bill the service in a calendar month. ?The patient may stop CCM services at any time (effective at the end of the month) by phone call to the office staff. ? ?Patient agreed to services and verbal consent obtained.  ? ?The patient verbalized understanding of instructions, educational materials, and care plan provided today and agreed to receive a mailed copy of patient instructions, educational materials, and care plan.  ?Telephone follow up appointment with pharmacy team member scheduled for: 1 year ? ?Edythe Clarity, [facility]  ?Beverly Milch, PharmD ?Clinical Pharmacist  ?[place] ?([phone] ? ?

## 2022-01-08 ENCOUNTER — Inpatient Hospital Stay: Payer: Medicare Other

## 2022-01-08 ENCOUNTER — Inpatient Hospital Stay (HOSPITAL_BASED_OUTPATIENT_CLINIC_OR_DEPARTMENT_OTHER): Payer: Medicare Other | Admitting: Nurse Practitioner

## 2022-01-08 ENCOUNTER — Encounter: Payer: Self-pay | Admitting: Nurse Practitioner

## 2022-01-08 VITALS — BP 155/81 | HR 63 | Temp 98.1°F | Resp 18 | Wt 167.4 lb

## 2022-01-08 DIAGNOSIS — C20 Malignant neoplasm of rectum: Secondary | ICD-10-CM | POA: Diagnosis not present

## 2022-01-08 LAB — CMP (CANCER CENTER ONLY)
ALT: 11 U/L (ref 0–44)
AST: 13 U/L — ABNORMAL LOW (ref 15–41)
Albumin: 4 g/dL (ref 3.5–5.0)
Alkaline Phosphatase: 61 U/L (ref 38–126)
Anion gap: 8 (ref 5–15)
BUN: 18 mg/dL (ref 8–23)
CO2: 25 mmol/L (ref 22–32)
Calcium: 9.8 mg/dL (ref 8.9–10.3)
Chloride: 110 mmol/L (ref 98–111)
Creatinine: 1.05 mg/dL (ref 0.61–1.24)
GFR, Estimated: >60 mL/min (ref >60)
Glucose, Bld: 98 mg/dL (ref 70–99)
Potassium: 3.9 mmol/L (ref 3.5–5.1)
Sodium: 143 mmol/L (ref 135–145)
Total Bilirubin: 0.9 mg/dL (ref 0.3–1.2)
Total Protein: 5.9 g/dL — ABNORMAL LOW (ref 6.5–8.1)

## 2022-01-08 LAB — CBC WITH DIFFERENTIAL (CANCER CENTER ONLY)
Abs Immature Granulocytes: 0.02 10*3/uL (ref 0.00–0.07)
Basophils Absolute: 0 10*3/uL (ref 0.0–0.1)
Basophils Relative: 0 %
Eosinophils Absolute: 0.2 10*3/uL (ref 0.0–0.5)
Eosinophils Relative: 3 %
HCT: 31.7 % — ABNORMAL LOW (ref 39.0–52.0)
Hemoglobin: 11.4 g/dL — ABNORMAL LOW (ref 13.0–17.0)
Immature Granulocytes: 0 %
Lymphocytes Relative: 12 %
Lymphs Abs: 0.6 10*3/uL — ABNORMAL LOW (ref 0.7–4.0)
MCH: 31.4 pg (ref 26.0–34.0)
MCHC: 36 g/dL (ref 30.0–36.0)
MCV: 87.3 fL (ref 80.0–100.0)
Monocytes Absolute: 0.4 10*3/uL (ref 0.1–1.0)
Monocytes Relative: 8 %
Neutro Abs: 3.8 10*3/uL (ref 1.7–7.7)
Neutrophils Relative %: 77 %
Platelet Count: 184 10*3/uL (ref 150–400)
RBC: 3.63 MIL/uL — ABNORMAL LOW (ref 4.22–5.81)
RDW: 17.1 % — ABNORMAL HIGH (ref 11.5–15.5)
WBC Count: 4.9 10*3/uL (ref 4.0–10.5)
nRBC: 0 % (ref 0.0–0.2)

## 2022-01-08 LAB — CEA (ACCESS): CEA (CHCC): 3.9 ng/mL (ref 0.00–5.00)

## 2022-01-08 NOTE — Progress Notes (Signed)
?Neil Perry ?OFFICE PROGRESS NOTE ? ? ?Diagnosis: Rectal cancer ? ?INTERVAL HISTORY:  ? ?Mr. Castilla returns as scheduled.  Xeloda was resumed at a reduced dose on 12/27/2021.  He denies nausea/vomiting.  No mouth sores.  No diarrhea.  No hand or foot pain or redness.  He notes fatigue mainly in the afternoons.  He takes a nap if needed.  He notes that he craves sweets. ? ?Objective: ? ?Vital signs in last 24 hours: ? ?Blood pressure (!) 155/81, pulse 63, temperature 98.1 ?F (36.7 ?C), temperature source Tympanic, resp. rate 18, weight 167 lb 6.4 oz (75.9 kg), SpO2 100 %. ?  ? ?HEENT: No thrush or ulcers. ?Resp: Lungs clear bilaterally. ?Cardio: Regular rate and rhythm. ?GI: Abdomen soft and nontender.  No hepatomegaly. ?Vascular: No leg edema. ?Skin: Palms/hands with hyperpigmentation.  No skin breakdown. ?Port-A-Cath without erythema ? ? ?Lab Results: ? ?Lab Results  ?Component Value Date  ? WBC 4.9 01/08/2022  ? HGB 11.4 (L) 01/08/2022  ? HCT 31.7 (L) 01/08/2022  ? MCV 87.3 01/08/2022  ? PLT 184 01/08/2022  ? NEUTROABS 3.8 01/08/2022  ? ? ?Imaging: ? ?No results found. ? ?Medications: I have reviewed the patient's current medications. ? ?Assessment/Plan: ?Rectal cancer ?MRI abdomen 04/26/2019 2-3 new hypervascular masses in the liver dome, no abdominal lymphadenopathy, stable benign left adrenal adenoma, stable right lower pole kidney mass ?Ultrasound-guided biopsy of a liver lesion 05/07/2021-metastatic adenocarcinoma with extensive necrosis, immunohistochemical profile consistent with a colorectal primary; foundation 1-microsatellite stable, tumor mutation burden 5, K-rasG 12V, NRAS wildtype ?Colonoscopy 05/22/2021-ulcerated partially obstructing mass at 15 cm from anal verge ?CTs 05/30/2021-numerous small pulmonary nodules concerning for metastases, thickening of the rectum, multiple liver metastases ?Cycle 1 FOLFOX 06/12/2021 ?Cycle 2 FOLFOX 06/26/2021 ?Cycle 3 FOLFOX 07/10/2021, oxaliplatin dose  reduced due to progressive decline in the [place] and platelet count ?Cycle 4 FOLFOX 07/24/2021 ?Cycle 5 FOLFOX 08/07/2021, 5-FU bolus eliminated and oxaliplatin dose reduced, insurance would not approve Udenyca ?CTs 08/15/2021-decrease size of lung nodules, decreased hepatic metastases, persistent anorectal wall thickening, stable right renal mass ?Cycle 6 FOLFOX 08/21/2021, 5-FU bolus and 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 ?Cycle 7 FOLFOX 09/04/2021, 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 ?Cycle 8 FOLFOX 09/18/2021, 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 ?Cycle 9 FOLFOX 10/08/2021, 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 ?Cycle 10 FOLFOX 10/23/2021, 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 ?CTs 11/01/2021-decrease in size of occasional small pulmonary nodules, continued decrease in size of multiple hypoenhancing hepatic metastases, unchanged posttreatment appearance of the low rectum, unchanged exophytic mass of the inferior pole of the right kidney measuring 1.5 x 1.3 cm ?Cycle 11 5-fluorouracil 11/06/2021 ?Cycle 12 5-fluorouracil 11/20/2021 ?Cycle 13 5-fluorouracil 12/04/2021 ?Maintenance Xeloda beginning 12/19/2021 ?Xeloda dose reduced to 1000 mg twice daily beginning 12/27/2021 ?Prostate cancer-simple prostatectomy January 2019, Gleason 3+3, 10 to 12% of specimen ?Active surveillance, biopsy May 2019 with small focus of Gleason 3+3 disease in 1/6 cores, surveillance continued ?Elevated PSA 04/20/2020 ?Biopsy 06/19/2020 8/12 cores positive, Gleason 4+4 ?PET scan 07/07/2020-negative ?CT 07/07/2020 right iliac and retrocaval adenopathy, 1.3 cm subcapsular right lower pole renal lesion ?Androgen deprivation therapy beginning 08/21/2020 ?Radiation to prostate, seminal vesicles, and pelvic lymph nodes to 10/22 - 12/28/2020 ?He continues every 5-monthFirmagon and daily Xtandi ?  ?3.  Right renal mass consistent with a renal cell carcinoma-stable on MRI abdomen  04/25/2021, stable on CT 08/15/2021 ?4.  Mitral valve prolapse ?5.  Family history of prostate cancer ?6.  08/15/2021 with leg  weakness and an episode of slurred speech-TIA?,  Brain imaging negative ?  ? ?Disposition: Mr. Gerding appears stable.  He is tolerating Xeloda without significant acute toxicity.  He will continue at the current dose. ? ?CBC from today reviewed.  Counts adequate to continue taking Xeloda. ? ?He will return for lab and follow-up in 2 to 3 weeks.  He will contact the office in the interim with any problems. ? ? ? ?Ned Card ANP/GNP-BC  ? ?01/08/2022  ?2:11 PM ? ? ? ? ? ? ? ?

## 2022-01-15 ENCOUNTER — Telehealth: Payer: Self-pay | Admitting: *Deleted

## 2022-01-15 NOTE — Telephone Encounter (Signed)
-----   Message from Vivi Barrack, MD sent at 01/15/2022 12:18 PM EDT ----- ?Regarding: RE: Medication Question ?We don't prescribe this or carry samples. He needs to go to his prescribers office. ? ?Algis Greenhouse. Jerline Pain, MD ?01/15/2022 12:18 PM  ? ?----- Message ----- ?From: Zacarias Pontes, CMA ?Sent: 01/15/2022  11:53 AM EDT ?To: Vivi Barrack, MD ?Subject: FW: Medication Question                       ? ?Please advise. ?----- Message ----- ?From: Calhoun, April D ?Sent: 01/14/2022   1:37 PM EDT ?To: [facility] Refill ?Subject: Medication Question                           ? ?Patient wants to know if he can come pick up a 14 DS of samples of XTANDI 40 mg tablets. He would like a call back from someone at Dr. Ellwood Handler office.  ? ?Thank you, ? ?April D Calhoun, [place] ?Clinical Pharmacist Assistant ?[phone] ? ? ? ? ?

## 2022-01-15 NOTE — Telephone Encounter (Signed)
Spoke to patient and informed him that this office does not have samples or prescribe this medication. Patient verbalized understanding. Patient stated that message was supposed to be sent to his urologist, not his PCP.  ? ?Zacarias Pontes, CMA  ?

## 2022-01-15 NOTE — Telephone Encounter (Signed)
Spoke to patient and informed him that this office does not have samples or prescribe this medication. Patient verbalized understanding. Patient stated that message was supposed to be sent to his urologist, not his PCP.  ? ?Zacarias Pontes, CMA ? ?

## 2022-01-15 NOTE — Telephone Encounter (Signed)
-----   Message from April D Calhoun sent at 01/14/2022  1:36 PM EDT ----- ?Regarding: Medication Question ?Patient wants to know if he can come pick up a 14 DS of samples of XTANDI 40 mg tablets. He would like a call back from someone at Dr. Ellwood Handler office.  ? ?Thank you, ? ?April D Calhoun, [place] ?Clinical Pharmacist Assistant ?[phone] ? ? ?

## 2022-01-16 ENCOUNTER — Encounter: Payer: Self-pay | Admitting: *Deleted

## 2022-01-16 NOTE — Progress Notes (Signed)
PATIENT NAVIGATOR PROGRESS NOTE ? ?Name: Neil Perry ?Date: 01/16/2022 ?MRN: [id number]  ?DOB: 1941/08/17 ? ? ?Reason for visit:  ?Phone call ? ?Comments:  [name] called in to discuss his intolerance to Xeloda, states that he is experiencing gas, decreased appetite, extreme fatigue, "No joy in life with these side effects" ?Spoke with Ned Card NP and instructed pt to stop Xeloda and we will discuss with Dr [name] and bring him to discuss. ? ?Will call him with appt after discussion with Dr [name] ? ? ? ?Time spent counseling/coordinating care: 30-45 minutes ? ?

## 2022-01-17 ENCOUNTER — Inpatient Hospital Stay: Payer: Medicare Other | Admitting: Oncology

## 2022-01-17 NOTE — Progress Notes (Deleted)
[facility] OFFICE PROGRESS NOTE   Diagnosis: Rectal cancer  INTERVAL HISTORY:   Neil Perry returns prior to a scheduled visit.  He resume Xeloda for 03/12/2022.  Objective:  Vital signs in last 24 hours:  There were no vitals taken for this visit.    HEENT: *** Lymphatics: *** Resp: *** Cardio: *** GI: *** Vascular: *** Neuro:***  Skin:***   Portacath/PICC-without erythema  Lab Results:  Lab Results  Component Value Date   WBC 4.9 01/08/2022   HGB 11.4 (L) 01/08/2022   HCT 31.7 (L) 01/08/2022   MCV 87.3 01/08/2022   PLT 184 01/08/2022   NEUTROABS 3.8 01/08/2022    CMP  Lab Results  Component Value Date   NA 143 01/08/2022   K 3.9 01/08/2022   CL 110 01/08/2022   CO2 25 01/08/2022   GLUCOSE 98 01/08/2022   BUN 18 01/08/2022   CREATININE 1.05 01/08/2022   CALCIUM 9.8 01/08/2022   PROT 5.9 (L) 01/08/2022   ALBUMIN 4.0 01/08/2022   AST 13 (L) 01/08/2022   ALT 11 01/08/2022   ALKPHOS 61 01/08/2022   BILITOT 0.9 01/08/2022   GFRNONAA >60 01/08/2022    Lab Results  Component Value Date   CEA 3.90 01/08/2022    Lab Results  Component Value Date   INR 1.0 05/07/2021   LABPROT 13.6 05/07/2021    Imaging:  No results found.  Medications: I have reviewed the patient's current medications.   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; foundation 1-microsatellite stable, tumor mutation burden 5, K-rasG 12V, NRAS wildtype Colonoscopy 05/22/2021-ulcerated partially obstructing mass at 15 cm from anal verge CTs 05/30/2021-numerous small pulmonary nodules concerning for metastases, thickening of the rectum, multiple  liver metastases Cycle 1 FOLFOX 06/12/2021 Cycle 2 FOLFOX 06/26/2021 Cycle 3 FOLFOX 07/10/2021, oxaliplatin dose reduced due to progressive decline in the [place] and platelet count Cycle 4 FOLFOX 07/24/2021 Cycle 5 FOLFOX 08/07/2021, 5-FU bolus eliminated and oxaliplatin dose reduced, insurance would not approve Udenyca CTs 08/15/2021-decrease size of lung nodules, decreased hepatic metastases, persistent anorectal wall thickening, stable right renal mass Cycle 6 FOLFOX 08/21/2021, 5-FU bolus and 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 Cycle 7 FOLFOX 09/04/2021, 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 Cycle 8 FOLFOX 09/18/2021, 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 Cycle 9 FOLFOX 10/08/2021, 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 Cycle 10 FOLFOX 10/23/2021, 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 CTs 11/01/2021-decrease in size of occasional small pulmonary nodules, continued decrease in size of multiple hypoenhancing hepatic metastases, unchanged posttreatment appearance of the low rectum, unchanged exophytic mass of the inferior pole of the right kidney measuring 1.5 x 1.3 cm Cycle 11 5-fluorouracil 11/06/2021 Cycle 12 5-fluorouracil 11/20/2021 Cycle 13 5-fluorouracil 12/04/2021 Maintenance Xeloda beginning 12/19/2021 Xeloda dose reduced to 1000 mg twice daily beginning 12/27/2021 Prostate cancer-simple prostatectomy January 2019, Gleason 3+3, 10 to 12% of specimen Active surveillance, biopsy May 2019 with small focus of Gleason 3+3 disease in 1/6 cores, surveillance continued Elevated PSA 04/20/2020 Biopsy 06/19/2020 8/12 cores positive, Gleason 4+4 PET scan 07/07/2020-negative CT 07/07/2020 right iliac and retrocaval adenopathy, 1.3 cm subcapsular right lower pole renal lesion Androgen deprivation therapy beginning 08/21/2020 Radiation to prostate, seminal vesicles, and pelvic lymph nodes to 10/22 - 12/28/2020 He continues every 42-month  Firmagon and daily Xtandi   3.  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  Mitral valve prolapse 5.  Family history of prostate cancer 6.  08/15/2021 with leg weakness and an episode of slurred speech-TIA?,  Brain imaging negative      Disposition: ***  Betsy Coder, MD  01/17/2022  7:47 AM

## 2022-01-18 ENCOUNTER — Encounter: Payer: Self-pay | Admitting: Nurse Practitioner

## 2022-01-18 ENCOUNTER — Telehealth: Payer: Self-pay

## 2022-01-18 ENCOUNTER — Inpatient Hospital Stay (HOSPITAL_BASED_OUTPATIENT_CLINIC_OR_DEPARTMENT_OTHER): Payer: Medicare Other | Admitting: Nurse Practitioner

## 2022-01-18 ENCOUNTER — Other Ambulatory Visit: Payer: Self-pay | Admitting: Nurse Practitioner

## 2022-01-18 ENCOUNTER — Inpatient Hospital Stay: Payer: Medicare Other

## 2022-01-18 ENCOUNTER — Other Ambulatory Visit: Payer: Self-pay

## 2022-01-18 VITALS — BP 115/75 | HR 99 | Temp 98.1°F | Resp 18 | Ht 69.0 in | Wt 167.0 lb

## 2022-01-18 DIAGNOSIS — C20 Malignant neoplasm of rectum: Secondary | ICD-10-CM

## 2022-01-18 LAB — CMP (CANCER CENTER ONLY)
ALT: 11 U/L (ref 0–44)
AST: 13 U/L — ABNORMAL LOW (ref 15–41)
Albumin: 4 g/dL (ref 3.5–5.0)
Alkaline Phosphatase: 60 U/L (ref 38–126)
Anion gap: 9 (ref 5–15)
BUN: 23 mg/dL (ref 8–23)
CO2: 25 mmol/L (ref 22–32)
Calcium: 9.2 mg/dL (ref 8.9–10.3)
Chloride: 106 mmol/L (ref 98–111)
Creatinine: 1.17 mg/dL (ref 0.61–1.24)
GFR, Estimated: >60 mL/min (ref >60)
Glucose, Bld: 126 mg/dL — ABNORMAL HIGH (ref 70–99)
Potassium: 3.2 mmol/L — ABNORMAL LOW (ref 3.5–5.1)
Sodium: 140 mmol/L (ref 135–145)
Total Bilirubin: 1.4 mg/dL — ABNORMAL HIGH (ref 0.3–1.2)
Total Protein: 6 g/dL — ABNORMAL LOW (ref 6.5–8.1)

## 2022-01-18 LAB — CBC WITH DIFFERENTIAL (CANCER CENTER ONLY)
Abs Immature Granulocytes: 0.01 10*3/uL (ref 0.00–0.07)
Basophils Absolute: 0 10*3/uL (ref 0.0–0.1)
Basophils Relative: 0 %
Eosinophils Absolute: 0 10*3/uL (ref 0.0–0.5)
Eosinophils Relative: 1 %
HCT: 31.4 % — ABNORMAL LOW (ref 39.0–52.0)
Hemoglobin: 11.3 g/dL — ABNORMAL LOW (ref 13.0–17.0)
Immature Granulocytes: 0 %
Lymphocytes Relative: 6 %
Lymphs Abs: 0.3 10*3/uL — ABNORMAL LOW (ref 0.7–4.0)
MCH: 32.2 pg (ref 26.0–34.0)
MCHC: 36 g/dL (ref 30.0–36.0)
MCV: 89.5 fL (ref 80.0–100.0)
Monocytes Absolute: 0.6 10*3/uL (ref 0.1–1.0)
Monocytes Relative: 13 %
Neutro Abs: 3.5 10*3/uL (ref 1.7–7.7)
Neutrophils Relative %: 80 %
Platelet Count: 200 10*3/uL (ref 150–400)
RBC: 3.51 MIL/uL — ABNORMAL LOW (ref 4.22–5.81)
RDW: 20.3 % — ABNORMAL HIGH (ref 11.5–15.5)
WBC Count: 4.3 10*3/uL (ref 4.0–10.5)
nRBC: 0 % (ref 0.0–0.2)

## 2022-01-18 LAB — CEA (ACCESS): CEA (CHCC): 3.48 ng/mL (ref 0.00–5.00)

## 2022-01-18 MED ORDER — POTASSIUM CHLORIDE CRYS ER 20 MEQ PO TBCR: 20.0000 meq | EXTENDED_RELEASE_TABLET | Freq: Every day | ORAL | 1 refills | Status: DC

## 2022-01-18 NOTE — Telephone Encounter (Signed)
-----   Message from Owens Shark, NP sent at 01/18/2022  2:26 PM EDT ----- ?Please let him know he has mild hypokalemia.  I am sending a prescription to his pharmacy. ? ?

## 2022-01-18 NOTE — Telephone Encounter (Signed)
Called the patient to let him know he is schedule for his CT scan on 01/22/22 at 500 and arrived 445 at [facility]. Patient received his contrast and have the instruction.Patient gave verbal understanding, ?

## 2022-01-18 NOTE — Telephone Encounter (Signed)
Patient's wife gave verbal understanding. ?

## 2022-01-18 NOTE — Progress Notes (Signed)
?[facility] ?OFFICE PROGRESS NOTE ? ? ?Diagnosis: Rectal cancer ? ?INTERVAL HISTORY:  ? ?Neil Perry returns prior to scheduled follow-up to evaluate for Xeloda side effects.  He contacted the office 01/16/2022 to report gas, poor appetite, extreme fatigue.  Xeloda was placed on hold. ? ?He reports developing fatigue over the previous weekend.  He slept a considerable amount of the day yesterday.  He is feeling better today.  He had another "shaking episode" yesterday it lasted longer than it has in the past.  He had a fall yesterday hit his forehead on the wall.  Also scraped both knees.  He has mild nausea.  This is limiting oral intake.  No mouth sores.  He had loose stools a few days ago.  This has resolved.  No hand or foot pain or redness.  No unusual headaches.  No visual disturbance. ? ?He is concerned the symptoms he is experiencing are due to Xeloda.  He would like to resume the 5-FU pump. ? ?Objective: ? ?Vital signs in last 24 hours: ? ?Blood pressure 115/75, pulse 99, temperature 98.1 ?F (36.7 ?C), temperature source Oral, resp. rate 18, height 5' 9"  (1.753 m), weight 167 lb (75.8 kg), SpO2 98 %. ?  ? ?HEENT: No thrush or ulcers. ?Resp: Lungs clear bilaterally. ?Cardio: Regular rate and rhythm. ?GI: Abdomen soft and nontender.  No hepatosplenomegaly. ?Vascular: No leg edema. ?Neuro: Alert and oriented.  Follows commands.  Moves all extremities. ?Skin: Small resolving ecchymosis mid forehead.  Superficial abrasion both knees. ?Port-A-Cath without erythema. ? ?Lab Results: ? ?Lab Results  ?Component Value Date  ? WBC 4.9 01/08/2022  ? HGB 11.4 (L) 01/08/2022  ? HCT 31.7 (L) 01/08/2022  ? MCV 87.3 01/08/2022  ? PLT 184 01/08/2022  ? NEUTROABS 3.8 01/08/2022  ? ? ?Imaging: ? ?No results found. ? ?Medications: I have reviewed the patient's current medications. ? ?Assessment/Plan: ?Rectal cancer ?MRI abdomen 04/26/2019 2-3 new hypervascular masses in the liver dome, no abdominal lymphadenopathy,  stable benign left adrenal adenoma, stable right lower pole kidney mass ?Ultrasound-guided biopsy of a liver lesion 05/07/2021-metastatic adenocarcinoma with extensive necrosis, immunohistochemical profile consistent with a colorectal primary; foundation 1-microsatellite stable, tumor mutation burden 5, K-rasG 12V, NRAS wildtype ?Colonoscopy 05/22/2021-ulcerated partially obstructing mass at 15 cm from anal verge ?CTs 05/30/2021-numerous small pulmonary nodules concerning for metastases, thickening of the rectum, multiple liver metastases ?Cycle 1 FOLFOX 06/12/2021 ?Cycle 2 FOLFOX 06/26/2021 ?Cycle 3 FOLFOX 07/10/2021, oxaliplatin dose reduced due to progressive decline in the [place] and platelet count ?Cycle 4 FOLFOX 07/24/2021 ?Cycle 5 FOLFOX 08/07/2021, 5-FU bolus eliminated and oxaliplatin dose reduced, insurance would not approve Udenyca ?CTs 08/15/2021-decrease size of lung nodules, decreased hepatic metastases, persistent anorectal wall thickening, stable right renal mass ?Cycle 6 FOLFOX 08/21/2021, 5-FU bolus and 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 ?Cycle 7 FOLFOX 09/04/2021, 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 ?Cycle 8 FOLFOX 09/18/2021, 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 ?Cycle 9 FOLFOX 10/08/2021, 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 ?Cycle 10 FOLFOX 10/23/2021, 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 ?CTs 11/01/2021-decrease in size of occasional small pulmonary nodules, continued decrease in size of multiple hypoenhancing hepatic metastases, unchanged posttreatment appearance of the low rectum, unchanged exophytic mass of the inferior pole of the right kidney measuring 1.5 x 1.3 cm ?Cycle 11 5-fluorouracil 11/06/2021 ?Cycle 12 5-fluorouracil 11/20/2021 ?Cycle 13 5-fluorouracil 12/04/2021 ?Maintenance Xeloda beginning 12/19/2021 ?Xeloda dose reduced to 1000 mg twice daily beginning 12/27/2021 ?Xeloda placed on hold 01/16/2022 ?Prostate cancer-simple  prostatectomy January 2019, Gleason 3+3, 10 to 12% of specimen ?Active surveillance, biopsy May 2019 with small focus of Gleason 3+3 disease in 1/6 cores, surveillance continued ?Elevated PSA 04/20/2020 ?Biopsy 06/19/2020 8/12 cores positive, Gleason 4+4 ?PET scan 07/07/2020-negative ?CT 07/07/2020 right iliac and retrocaval adenopathy, 1.3 cm subcapsular right lower pole renal lesion ?Androgen deprivation therapy beginning 08/21/2020 ?Radiation to prostate, seminal vesicles, and pelvic lymph nodes to 10/22 - 12/28/2020 ?He continues every 43-monthFirmagon and daily Xtandi ?  ?3.  Right renal mass consistent with a renal cell carcinoma-stable on MRI abdomen 04/25/2021, stable on CT 08/15/2021 ?4.  Mitral valve prolapse ?5.  Family history of prostate cancer ?6.  08/15/2021 with leg weakness and an episode of slurred speech-TIA?,  Brain imaging negative ? ?Disposition: Mr. RMirabalappears stable.  He has developed fatigue/malaise/anorexia.  Symptoms not typical side effects of Xeloda.  He will continue to hold Xeloda.  We referred him for restaging CT scans.  If scans are stable plan to resume maintenance 5-fluorouracil via pump.  He is scheduled to return for follow-up on 01/29/2022.  He will contact the office in the interim with further problems. ? ? ? ?LNed CardANP/GNP-BC  ? ?01/18/2022  ?12:30 PM ? ? ? ? ? ? ? ?

## 2022-01-20 DIAGNOSIS — E782 Mixed hyperlipidemia: Secondary | ICD-10-CM | POA: Diagnosis not present

## 2022-01-20 DIAGNOSIS — C20 Malignant neoplasm of rectum: Secondary | ICD-10-CM

## 2022-01-20 DIAGNOSIS — I10 Essential (primary) hypertension: Secondary | ICD-10-CM | POA: Diagnosis not present

## 2022-01-22 ENCOUNTER — Encounter (HOSPITAL_BASED_OUTPATIENT_CLINIC_OR_DEPARTMENT_OTHER): Payer: Self-pay

## 2022-01-22 ENCOUNTER — Ambulatory Visit (HOSPITAL_BASED_OUTPATIENT_CLINIC_OR_DEPARTMENT_OTHER)
Admission: RE | Admit: 2022-01-22 | Discharge: 2022-01-22 | Disposition: A | Discharge status: Home / Self Care | Payer: Medicare Other | Source: Ambulatory Visit | Attending: Nurse Practitioner | Admitting: Nurse Practitioner

## 2022-01-22 DIAGNOSIS — C20 Malignant neoplasm of rectum: Secondary | ICD-10-CM | POA: Diagnosis not present

## 2022-01-22 MED ORDER — HEPARIN SOD (PORK) LOCK FLUSH 100 UNIT/ML IV SOLN
500.0000 [IU] | Freq: Once | INTRAVENOUS | Status: AC
  Administered 2022-01-22: 500 [IU] via INTRAVENOUS

## 2022-01-22 MED ORDER — IOHEXOL 300 MG/ML  SOLN
100.0000 mL | Freq: Once | INTRAMUSCULAR | Status: AC | PRN
  Administered 2022-01-22: 85 mL via INTRAVENOUS

## 2022-01-23 ENCOUNTER — Telehealth: Payer: Self-pay

## 2022-01-23 NOTE — Telephone Encounter (Signed)
I called the patient to let him know his Ct scan shows liver lesions are smaller, lung nodules stable. In the small bowel there is evidence of inflammation. Patient is complaint of having diarrhea for the late 4 days, however today it seem to be getting better. He had been taking Imodium and still hydrate. He denied any abdominal pain, nausea or vomiting. I advice the patient to call the office if he start having any nausea,vomiting, or abdominal pain. Patient gave verbal understanding and had no further questions or concerns at this time. ?

## 2022-01-23 NOTE — Telephone Encounter (Signed)
-----   Message from Owens Shark, NP sent at 01/23/2022  1:01 PM EDT ----- ?Please call him-the CT scan shows liver lesions are smaller, lung nodules stable.  In the small bowel there is evidence of inflammation.  Is he having nausea/vomiting, diarrhea, abdominal pain, fever?  Please let me know. ? ?

## 2022-01-29 ENCOUNTER — Inpatient Hospital Stay: Payer: Medicare Other | Attending: Nurse Practitioner

## 2022-01-29 ENCOUNTER — Inpatient Hospital Stay: Payer: Medicare Other

## 2022-01-29 ENCOUNTER — Inpatient Hospital Stay (HOSPITAL_BASED_OUTPATIENT_CLINIC_OR_DEPARTMENT_OTHER): Payer: Medicare Other | Admitting: Oncology

## 2022-01-29 VITALS — BP 146/65 | HR 73 | Temp 98.1°F | Resp 20 | Ht 69.0 in | Wt 167.8 lb

## 2022-01-29 DIAGNOSIS — Z5111 Encounter for antineoplastic chemotherapy: Secondary | ICD-10-CM | POA: Diagnosis present

## 2022-01-29 DIAGNOSIS — R5383 Other fatigue: Secondary | ICD-10-CM | POA: Insufficient documentation

## 2022-01-29 DIAGNOSIS — C20 Malignant neoplasm of rectum: Secondary | ICD-10-CM | POA: Insufficient documentation

## 2022-01-29 DIAGNOSIS — Z8546 Personal history of malignant neoplasm of prostate: Secondary | ICD-10-CM | POA: Diagnosis not present

## 2022-01-29 DIAGNOSIS — I341 Nonrheumatic mitral (valve) prolapse: Secondary | ICD-10-CM | POA: Insufficient documentation

## 2022-01-29 DIAGNOSIS — Z95828 Presence of other vascular implants and grafts: Secondary | ICD-10-CM

## 2022-01-29 DIAGNOSIS — R197 Diarrhea, unspecified: Secondary | ICD-10-CM | POA: Insufficient documentation

## 2022-01-29 LAB — CBC WITH DIFFERENTIAL (CANCER CENTER ONLY)
Abs Immature Granulocytes: 0.07 10*3/uL (ref 0.00–0.07)
Basophils Absolute: 0 10*3/uL (ref 0.0–0.1)
Basophils Relative: 0 %
Eosinophils Absolute: 0.1 10*3/uL (ref 0.0–0.5)
Eosinophils Relative: 2 %
HCT: 28.8 % — ABNORMAL LOW (ref 39.0–52.0)
Hemoglobin: 9.8 g/dL — ABNORMAL LOW (ref 13.0–17.0)
Immature Granulocytes: 2 %
Lymphocytes Relative: 13 %
Lymphs Abs: 0.5 10*3/uL — ABNORMAL LOW (ref 0.7–4.0)
MCH: 31.3 pg (ref 26.0–34.0)
MCHC: 34 g/dL (ref 30.0–36.0)
MCV: 92 fL (ref 80.0–100.0)
Monocytes Absolute: 0.3 10*3/uL (ref 0.1–1.0)
Monocytes Relative: 6 %
Neutro Abs: 3 10*3/uL (ref 1.7–7.7)
Neutrophils Relative %: 77 %
Platelet Count: 225 10*3/uL (ref 150–400)
RBC: 3.13 MIL/uL — ABNORMAL LOW (ref 4.22–5.81)
RDW: 20 % — ABNORMAL HIGH (ref 11.5–15.5)
WBC Count: 4 10*3/uL (ref 4.0–10.5)
nRBC: 0 % (ref 0.0–0.2)

## 2022-01-29 LAB — CMP (CANCER CENTER ONLY)
ALT: 12 U/L (ref 0–44)
AST: 13 U/L — ABNORMAL LOW (ref 15–41)
Albumin: 3.5 g/dL (ref 3.5–5.0)
Alkaline Phosphatase: 57 U/L (ref 38–126)
Anion gap: 8 (ref 5–15)
BUN: 16 mg/dL (ref 8–23)
CO2: 26 mmol/L (ref 22–32)
Calcium: 8.7 mg/dL — ABNORMAL LOW (ref 8.9–10.3)
Chloride: 110 mmol/L (ref 98–111)
Creatinine: 0.9 mg/dL (ref 0.61–1.24)
GFR, Estimated: >60 mL/min (ref >60)
Glucose, Bld: 128 mg/dL — ABNORMAL HIGH (ref 70–99)
Potassium: 3.4 mmol/L — ABNORMAL LOW (ref 3.5–5.1)
Sodium: 144 mmol/L (ref 135–145)
Total Bilirubin: 0.6 mg/dL (ref 0.3–1.2)
Total Protein: 5.2 g/dL — ABNORMAL LOW (ref 6.5–8.1)

## 2022-01-29 LAB — CEA (ACCESS): CEA (CHCC): 4.26 ng/mL (ref 0.00–5.00)

## 2022-01-29 MED ORDER — SODIUM CHLORIDE 0.9% FLUSH
10.0000 mL | Freq: Once | INTRAVENOUS | Status: AC
  Administered 2022-01-29: 10 mL via INTRAVENOUS

## 2022-01-29 MED ORDER — HEPARIN SOD (PORK) LOCK FLUSH 100 UNIT/ML IV SOLN
500.0000 [IU] | Freq: Once | INTRAVENOUS | Status: AC
  Administered 2022-01-29: 500 [IU] via INTRAVENOUS

## 2022-01-29 NOTE — Progress Notes (Signed)
?[facility] ?OFFICE PROGRESS NOTE ? ? ?Diagnosis: Rectal cancer ? ?INTERVAL HISTORY:  ? ?Neil Perry returns as scheduled.  He discontinued Xeloda when he was here 01/18/2022.  He reports approximately 4 total days of "explosive "diarrhea.  Diarrhea has resolved.  No further dizziness.  No abdominal pain.  No bleeding.  He feels well. ? ?Objective: ? ?Vital signs in last 24 hours: ? ?Blood pressure (!) 146/65, pulse 73, temperature 98.1 ?F (36.7 ?C), temperature source Oral, resp. rate 20, height 5' 9"  (1.753 m), weight 167 lb 12.8 oz (76.1 kg), SpO2 100 %. ?  ? ?HEENT: No thrush or ulcer ?Resp: Lungs clear bilaterally ?Cardio: Regular rate and rhythm ?GI: Nontender, no hepatosplenomegaly, no mass ?Vascular: Trace pitting edema at the lower leg and ankle bilaterally  ? ?Portacath/PICC-without erythema ? ?Lab Results: ? ?Lab Results  ?Component Value Date  ? WBC 4.0 01/29/2022  ? HGB 9.8 (L) 01/29/2022  ? HCT 28.8 (L) 01/29/2022  ? MCV 92.0 01/29/2022  ? PLT 225 01/29/2022  ? NEUTROABS 3.0 01/29/2022  ? ? ?CMP  ?Lab Results  ?Component Value Date  ? NA 144 01/29/2022  ? K 3.4 (L) 01/29/2022  ? CL 110 01/29/2022  ? CO2 26 01/29/2022  ? GLUCOSE 128 (H) 01/29/2022  ? BUN 16 01/29/2022  ? CREATININE 0.90 01/29/2022  ? CALCIUM 8.7 (L) 01/29/2022  ? PROT 5.2 (L) 01/29/2022  ? ALBUMIN 3.5 01/29/2022  ? AST 13 (L) 01/29/2022  ? ALT 12 01/29/2022  ? ALKPHOS 57 01/29/2022  ? BILITOT 0.6 01/29/2022  ? GFRNONAA >60 01/29/2022  ? ? ?Lab Results  ?Component Value Date  ? CEA 4.26 01/29/2022  ? ? ? ?Medications: I have reviewed the patient's current medications. ? ? ?Assessment/Plan: ?Rectal cancer ?MRI abdomen 04/26/2019 2-3 new hypervascular masses in the liver dome, no abdominal lymphadenopathy, stable benign left adrenal adenoma, stable right lower pole kidney mass ?Ultrasound-guided biopsy of a liver lesion 05/07/2021-metastatic adenocarcinoma with extensive necrosis, immunohistochemical profile consistent with a  colorectal primary; foundation 1-microsatellite stable, tumor mutation burden 5, K-rasG 12V, NRAS wildtype ?Colonoscopy 05/22/2021-ulcerated partially obstructing mass at 15 cm from anal verge ?CTs 05/30/2021-numerous small pulmonary nodules concerning for metastases, thickening of the rectum, multiple liver metastases ?Cycle 1 FOLFOX 06/12/2021 ?Cycle 2 FOLFOX 06/26/2021 ?Cycle 3 FOLFOX 07/10/2021, oxaliplatin dose reduced due to progressive decline in the [place] and platelet count ?Cycle 4 FOLFOX 07/24/2021 ?Cycle 5 FOLFOX 08/07/2021, 5-FU bolus eliminated and oxaliplatin dose reduced, insurance would not approve Udenyca ?CTs 08/15/2021-decrease size of lung nodules, decreased hepatic metastases, persistent anorectal wall thickening, stable right renal mass ?Cycle 6 FOLFOX 08/21/2021, 5-FU bolus and 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 ?Cycle 7 FOLFOX 09/04/2021, 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 ?Cycle 8 FOLFOX 09/18/2021, 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 ?Cycle 9 FOLFOX 10/08/2021, 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 ?Cycle 10 FOLFOX 10/23/2021, 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 ?CTs 11/01/2021-decrease in size of occasional small pulmonary nodules, continued decrease in size of multiple hypoenhancing hepatic metastases, unchanged posttreatment appearance of the low rectum, unchanged exophytic mass of the inferior pole of the right kidney measuring 1.5 x 1.3 cm ?Cycle 11 5-fluorouracil 11/06/2021 ?Cycle 12 5-fluorouracil 11/20/2021 ?Cycle 13 5-fluorouracil 12/04/2021 ?Maintenance Xeloda beginning 12/19/2021 ?Xeloda dose reduced to 1000 mg twice daily beginning 12/27/2021 ?Xeloda placed on hold 01/16/2022 ?CTs 01/22/2022-segment of distal ileum with circumferential wall thickening and perienteric inflammation, decrease in size of liver metastases, stable tiny bilateral pulmonary nodules, circumferential wall thickening in  the rectum, stable lower  pole right kidney lesion suspicious for renal cell carcinoma ?Prostate cancer-simple prostatectomy January 2019, Gleason 3+3, 10 to 12% of specimen ?Active surveillance, biopsy May 2019 with small focus of Gleason 3+3 disease in 1/6 cores, surveillance continued ?Elevated PSA 04/20/2020 ?Biopsy 06/19/2020 8/12 cores positive, Gleason 4+4 ?PET scan 07/07/2020-negative ?CT 07/07/2020 right iliac and retrocaval adenopathy, 1.3 cm subcapsular right lower pole renal lesion ?Androgen deprivation therapy beginning 08/21/2020 ?Radiation to prostate, seminal vesicles, and pelvic lymph nodes to 10/22 - 12/28/2020 ?He continues every 49-monthFirmagon and daily Xtandi ?  ?3.  Right renal mass consistent with a renal cell carcinoma-stable on MRI abdomen 04/25/2021, stable on CT 08/15/2021 ?4.  Mitral valve prolapse ?5.  Family history of prostate cancer ?6.  08/15/2021 with leg weakness and an episode of slurred speech-TIA?,  Brain imaging negative ?7.  Diarrhea and fatigue while on capecitabine April 2023-capecitabine discontinued 01/18/2022 ? ? ? ?Disposition: ?Mr. Lares appears well today.  I reviewed the restaging CT findings and images with him.  The CT is consistent with continued improvement in the metastatic tumor burden. ?He experienced significant GI toxicity while on capecitabine.  Capecitabine has been discontinued.  The plan is to continue maintenance therapy with infusional 5-fluorouracil.  He will return for a 5-FU pump on 02/05/2022.  He will return for an office visit and a 5-FU pump on 02/26/2022. ? ?GBetsy Coder MD ? ?01/29/2022  ?11:58 AM ? ? ?

## 2022-01-29 NOTE — Progress Notes (Signed)
DISCONTINUE ON PATHWAY REGIMEN - Colorectal ? ? ?  A cycle is every 14 days: ?    Oxaliplatin  ?    Leucovorin  ?    Fluorouracil  ?    Fluorouracil  ? ?**Always confirm dose/schedule in your pharmacy ordering system** ? ?REASON: Toxicities / Adverse Event ?PRIOR TREATMENT: [id number]: [id number] q14 Days ?TREATMENT RESPONSE: Partial Response (PR) ? ?START OFF PATHWAY REGIMEN - Colorectal ? ? ?[id number] (Leucovorin IV D1 + Fluorouracil IV D1/CIV D1,2 + Oxaliplatin IV D1) q14 Days: ?  A cycle is every 14 days: ?    Oxaliplatin  ?    Leucovorin  ?    Fluorouracil  ?    Fluorouracil  ? ?**Always confirm dose/schedule in your pharmacy ordering system** ? ?Patient Characteristics: ?Distant Metastases, Nonsurgical Candidate, KRAS/NRAS Mutation Positive/Unknown (BRAF V600 Wild-Type/Unknown), Standard Cytotoxic Therapy, First Line Standard Cytotoxic Therapy, Bevacizumab Ineligible, PS = 0,1 ?Tumor Location: Rectal ?Therapeutic Status: Distant Metastases ?Microsatellite/Mismatch Repair Status: Unknown ?BRAF Mutation Status: Awaiting Test Results ?KRAS/NRAS Mutation Status: Awaiting Test Results ?Standard Cytotoxic Line of Therapy: First Line Standard Cytotoxic Therapy ?ECOG Performance Status: 0 ?Bevacizumab Eligibility: Ineligible ?Intent of Therapy: ?Non-Curative / Palliative Intent, Discussed with Patient ?

## 2022-02-05 ENCOUNTER — Inpatient Hospital Stay: Payer: Medicare Other

## 2022-02-05 VITALS — BP 151/73 | HR 60 | Temp 99.4°F | Resp 18 | Ht 69.0 in | Wt 172.0 lb

## 2022-02-05 DIAGNOSIS — C20 Malignant neoplasm of rectum: Secondary | ICD-10-CM

## 2022-02-05 DIAGNOSIS — Z5111 Encounter for antineoplastic chemotherapy: Secondary | ICD-10-CM | POA: Diagnosis not present

## 2022-02-05 LAB — CMP (CANCER CENTER ONLY)
ALT: 12 U/L (ref 0–44)
AST: 14 U/L — ABNORMAL LOW (ref 15–41)
Albumin: 3.5 g/dL (ref 3.5–5.0)
Alkaline Phosphatase: 62 U/L (ref 38–126)
Anion gap: 8 (ref 5–15)
BUN: 15 mg/dL (ref 8–23)
CO2: 26 mmol/L (ref 22–32)
Calcium: 9 mg/dL (ref 8.9–10.3)
Chloride: 110 mmol/L (ref 98–111)
Creatinine: 0.92 mg/dL (ref 0.61–1.24)
GFR, Estimated: >60 mL/min (ref >60)
Glucose, Bld: 109 mg/dL — ABNORMAL HIGH (ref 70–99)
Potassium: 3.9 mmol/L (ref 3.5–5.1)
Sodium: 144 mmol/L (ref 135–145)
Total Bilirubin: 0.5 mg/dL (ref 0.3–1.2)
Total Protein: 5.7 g/dL — ABNORMAL LOW (ref 6.5–8.1)

## 2022-02-05 LAB — CBC WITH DIFFERENTIAL (CANCER CENTER ONLY)
Abs Immature Granulocytes: 0.01 10*3/uL (ref 0.00–0.07)
Basophils Absolute: 0 10*3/uL (ref 0.0–0.1)
Basophils Relative: 1 %
Eosinophils Absolute: 0.1 10*3/uL (ref 0.0–0.5)
Eosinophils Relative: 2 %
HCT: 32.3 % — ABNORMAL LOW (ref 39.0–52.0)
Hemoglobin: 10.8 g/dL — ABNORMAL LOW (ref 13.0–17.0)
Immature Granulocytes: 0 %
Lymphocytes Relative: 13 %
Lymphs Abs: 0.5 10*3/uL — ABNORMAL LOW (ref 0.7–4.0)
MCH: 31.1 pg (ref 26.0–34.0)
MCHC: 33.4 g/dL (ref 30.0–36.0)
MCV: 93.1 fL (ref 80.0–100.0)
Monocytes Absolute: 0.2 10*3/uL (ref 0.1–1.0)
Monocytes Relative: 6 %
Neutro Abs: 3.1 10*3/uL (ref 1.7–7.7)
Neutrophils Relative %: 78 %
Platelet Count: 207 10*3/uL (ref 150–400)
RBC: 3.47 MIL/uL — ABNORMAL LOW (ref 4.22–5.81)
RDW: 18.7 % — ABNORMAL HIGH (ref 11.5–15.5)
WBC Count: 3.9 10*3/uL — ABNORMAL LOW (ref 4.0–10.5)
nRBC: 0 % (ref 0.0–0.2)

## 2022-02-05 LAB — CEA (ACCESS): CEA (CHCC): 3.82 ng/mL (ref 0.00–5.00)

## 2022-02-05 LAB — MAGNESIUM: Magnesium: 1.9 mg/dL (ref 1.7–2.4)

## 2022-02-05 MED ORDER — SODIUM CHLORIDE 0.9 % IV SOLN
2000.0000 mg/m2 | INTRAVENOUS | Status: DC
  Administered 2022-02-05: 3850 mg via INTRAVENOUS
  Filled 2022-02-05: qty 77

## 2022-02-05 NOTE — Patient Instructions (Signed)
[facility]   ?Discharge Instructions: ?Thank you for choosing [facility] to provide your oncology and hematology care.  ? ?If you have a lab appointment with the [facility], please go directly to the [facility] and check in at the registration area. ?  ?Wear comfortable clothing and clothing appropriate for easy access to any Portacath or PICC line.  ? ?We strive to give you quality time with your provider. You may need to reschedule your appointment if you arrive late (15 or more minutes).  Arriving late affects you and other patients whose appointments are after yours.  Also, if you miss three or more appointments without notifying the office, you may be dismissed from the clinic at the provider?s discretion.    ?  ?For prescription refill requests, have your pharmacy contact our office and allow 72 hours for refills to be completed.   ? ?Today you received the following chemotherapy and/or immunotherapy agents Flourouracil (ADRUCIL).    ?  ?To help prevent nausea and vomiting after your treatment, we encourage you to take your nausea medication as directed. ? ?BELOW ARE SYMPTOMS THAT SHOULD BE REPORTED IMMEDIATELY: ?*FEVER GREATER THAN 100.4 F (38 ?C) OR HIGHER ?*CHILLS OR SWEATING ?*NAUSEA AND VOMITING THAT IS NOT CONTROLLED WITH YOUR NAUSEA MEDICATION ?*UNUSUAL SHORTNESS OF BREATH ?*UNUSUAL BRUISING OR BLEEDING ?*URINARY PROBLEMS (pain or burning when urinating, or frequent urination) ?*BOWEL PROBLEMS (unusual diarrhea, constipation, pain near the anus) ?TENDERNESS IN MOUTH AND THROAT WITH OR WITHOUT PRESENCE OF ULCERS (sore throat, sores in mouth, or a toothache) ?UNUSUAL RASH, SWELLING OR PAIN  ?UNUSUAL VAGINAL DISCHARGE OR ITCHING  ? ?Items with * indicate a potential emergency and should be followed up as soon as possible or go to the Emergency Department if any problems should occur. ? ?Please show the CHEMOTHERAPY ALERT CARD or IMMUNOTHERAPY ALERT CARD at  check-in to the Emergency Department and triage nurse. ? ?Should you have questions after your visit or need to cancel or reschedule your appointment, please contact [facility]  Dept: [phone]  and follow the prompts.  Office hours are 8:00 a.m. to 4:30 p.m. Monday - Friday. Please note that voicemails left after 4:00 p.m. may not be returned until the following business day.  We are closed weekends and major holidays. You have access to a nurse at all times for urgent questions. Please call the main number to the clinic Dept: [phone] and follow the prompts. ? ? ?For any non-urgent questions, you may also contact your provider using MyChart. We now offer e-Visits for anyone 49 and older to request care online for non-urgent symptoms. For details visit [email or web address]. ?  ?Also download the MyChart app! Go to the app store, search "MyChart", open the app, select [facility], and log in with your MyChart username and password. ? ?Due to Covid, a mask is required upon entering the hospital/clinic. If you do not have a mask, one will be given to you upon arrival. For doctor visits, patients may have 1 support person aged 96 or older with them. For treatment visits, patients cannot have anyone with them due to current Covid guidelines and our immunocompromised population.  ? ?Fluorouracil, 5-FU injection ?What is this medication? ?FLUOROURACIL, 5-FU (flure oh YOOR a sil) is a chemotherapy drug. It slows the growth of cancer cells. This medicine is used to treat many types of cancer like breast cancer, colon or rectal cancer, pancreatic cancer, and stomach cancer. ?  This medicine may be used for other purposes; ask your health care provider or pharmacist if you have questions. ?COMMON BRAND NAME(S): Adrucil ?What should I tell my care team before I take this medication? ?They need to know if you have any of these conditions: ?blood disorders ?dihydropyrimidine dehydrogenase  (DPD) deficiency ?infection (especially a virus infection such as chickenpox, cold sores, or herpes) ?kidney disease ?liver disease ?malnourished, poor nutrition ?recent or ongoing radiation therapy ?an unusual or allergic reaction to fluorouracil, other chemotherapy, other medicines, foods, dyes, or preservatives ?pregnant or trying to get pregnant ?breast-feeding ?How should I use this medication? ?This drug is given as an infusion or injection into a vein. It is administered in a hospital or clinic by a specially trained health care professional. ?Talk to your pediatrician regarding the use of this medicine in children. Special care may be needed. ?Overdosage: If you think you have taken too much of this medicine contact a poison control center or emergency room at once. ?NOTE: This medicine is only for you. Do not share this medicine with others. ?What if I miss a dose? ?It is important not to miss your dose. Call your doctor or health care professional if you are unable to keep an appointment. ?What may interact with this medication? ?Do not take this medicine with any of the following medications: ?live virus vaccines ?This medicine may also interact with the following medications: ?medicines that treat or prevent blood clots like warfarin, enoxaparin, and dalteparin ?This list may not describe all possible interactions. Give your health care provider a list of all the medicines, herbs, non-prescription drugs, or dietary supplements you use. Also tell them if you smoke, drink alcohol, or use illegal drugs. Some items may interact with your medicine. ?What should I watch for while using this medication? ?Visit your doctor for checks on your progress. This drug may make you feel generally unwell. This is not uncommon, as chemotherapy can affect healthy cells as well as cancer cells. Report any side effects. Continue your course of treatment even though you feel ill unless your doctor tells you to stop. ?In some  cases, you may be given additional medicines to help with side effects. Follow all directions for their use. ?Call your doctor or health care professional for advice if you get a fever, chills or sore throat, or other symptoms of a cold or flu. Do not treat yourself. This drug decreases your body's ability to fight infections. Try to avoid being around people who are sick. ?This medicine may increase your risk to bruise or bleed. Call your doctor or health care professional if you notice any unusual bleeding. ?Be careful brushing and flossing your teeth or using a toothpick because you may get an infection or bleed more easily. If you have any dental work done, tell your dentist you are receiving this medicine. ?Avoid taking products that contain aspirin, acetaminophen, ibuprofen, naproxen, or ketoprofen unless instructed by your doctor. These medicines may hide a fever. ?Do not become pregnant while taking this medicine. Women should inform their doctor if they wish to become pregnant or think they might be pregnant. There is a potential for serious side effects to an unborn child. Talk to your health care professional or pharmacist for more information. Do not breast-feed an infant while taking this medicine. ?Men should inform their doctor if they wish to father a child. This medicine may lower sperm counts. ?Do not treat diarrhea with over the counter products. Contact your doctor if  you have diarrhea that lasts more than 2 days or if it is severe and watery. ?This medicine can make you more sensitive to the sun. Keep out of the sun. If you cannot avoid being in the sun, wear protective clothing and use sunscreen. Do not use sun lamps or tanning beds/booths. ?What side effects may I notice from receiving this medication? ?Side effects that you should report to your doctor or health care professional as soon as possible: ?allergic reactions like skin rash, itching or hives, swelling of the face, lips, or  tongue ?low blood counts - this medicine may decrease the number of white blood cells, red blood cells and platelets. You may be at increased risk for infections and bleeding. ?signs of infection - fever or chill

## 2022-02-07 ENCOUNTER — Inpatient Hospital Stay: Payer: Medicare Other

## 2022-02-07 VITALS — BP 150/69 | HR 60 | Temp 98.5°F | Resp 18

## 2022-02-07 DIAGNOSIS — Z5111 Encounter for antineoplastic chemotherapy: Secondary | ICD-10-CM | POA: Diagnosis not present

## 2022-02-07 DIAGNOSIS — C20 Malignant neoplasm of rectum: Secondary | ICD-10-CM

## 2022-02-07 MED ORDER — HEPARIN SOD (PORK) LOCK FLUSH 100 UNIT/ML IV SOLN
500.0000 [IU] | Freq: Once | INTRAVENOUS | Status: AC | PRN
  Administered 2022-02-07: 500 [IU]

## 2022-02-07 MED ORDER — SODIUM CHLORIDE 0.9% FLUSH
10.0000 mL | INTRAVENOUS | Status: DC | PRN
  Administered 2022-02-07: 10 mL

## 2022-02-07 NOTE — Progress Notes (Signed)
Patient complained of 8 stools yesterday that were not loose or diarrhea. Today no stool but abdominal cramping. States always has alternating diarrhea and constipation, took his usual TUMS for this and states some relief. Abdomen soft, non tender and non distended. Will call for any further concerns or increasing symptoms.

## 2022-02-07 NOTE — Patient Instructions (Signed)

## 2022-02-20 ENCOUNTER — Other Ambulatory Visit: Payer: Self-pay

## 2022-02-20 DIAGNOSIS — C20 Malignant neoplasm of rectum: Secondary | ICD-10-CM

## 2022-02-24 ENCOUNTER — Other Ambulatory Visit: Payer: Self-pay | Admitting: Oncology

## 2022-02-26 ENCOUNTER — Inpatient Hospital Stay: Payer: Medicare Other | Attending: Nurse Practitioner | Admitting: Nurse Practitioner

## 2022-02-26 ENCOUNTER — Encounter: Payer: Self-pay | Admitting: *Deleted

## 2022-02-26 ENCOUNTER — Inpatient Hospital Stay: Payer: Medicare Other

## 2022-02-26 ENCOUNTER — Encounter: Payer: Self-pay | Admitting: Nurse Practitioner

## 2022-02-26 VITALS — BP 156/71 | HR 65 | Temp 98.1°F | Resp 18 | Ht 69.0 in | Wt 168.2 lb

## 2022-02-26 VITALS — BP 160/73 | HR 51 | Temp 97.1°F | Resp 16

## 2022-02-26 DIAGNOSIS — Z5111 Encounter for antineoplastic chemotherapy: Secondary | ICD-10-CM | POA: Diagnosis not present

## 2022-02-26 DIAGNOSIS — C20 Malignant neoplasm of rectum: Secondary | ICD-10-CM | POA: Diagnosis present

## 2022-02-26 DIAGNOSIS — Z85528 Personal history of other malignant neoplasm of kidney: Secondary | ICD-10-CM | POA: Diagnosis not present

## 2022-02-26 DIAGNOSIS — C787 Secondary malignant neoplasm of liver and intrahepatic bile duct: Secondary | ICD-10-CM | POA: Diagnosis present

## 2022-02-26 DIAGNOSIS — Z923 Personal history of irradiation: Secondary | ICD-10-CM | POA: Diagnosis not present

## 2022-02-26 LAB — CBC WITH DIFFERENTIAL (CANCER CENTER ONLY)
Abs Immature Granulocytes: 0.01 10*3/uL (ref 0.00–0.07)
Basophils Absolute: 0 10*3/uL (ref 0.0–0.1)
Basophils Relative: 1 %
Eosinophils Absolute: 0.2 10*3/uL (ref 0.0–0.5)
Eosinophils Relative: 4 %
HCT: 34.3 % — ABNORMAL LOW (ref 39.0–52.0)
Hemoglobin: 11.7 g/dL — ABNORMAL LOW (ref 13.0–17.0)
Immature Granulocytes: 0 %
Lymphocytes Relative: 12 %
Lymphs Abs: 0.5 10*3/uL — ABNORMAL LOW (ref 0.7–4.0)
MCH: 30.5 pg (ref 26.0–34.0)
MCHC: 34.1 g/dL (ref 30.0–36.0)
MCV: 89.6 fL (ref 80.0–100.0)
Monocytes Absolute: 0.3 10*3/uL (ref 0.1–1.0)
Monocytes Relative: 7 %
Neutro Abs: 3.1 10*3/uL (ref 1.7–7.7)
Neutrophils Relative %: 76 %
Platelet Count: 213 10*3/uL (ref 150–400)
RBC: 3.83 MIL/uL — ABNORMAL LOW (ref 4.22–5.81)
RDW: 14.5 % (ref 11.5–15.5)
WBC Count: 4.1 10*3/uL (ref 4.0–10.5)
nRBC: 0 % (ref 0.0–0.2)

## 2022-02-26 LAB — CMP (CANCER CENTER ONLY)
ALT: 9 U/L (ref 0–44)
AST: 11 U/L — ABNORMAL LOW (ref 15–41)
Albumin: 3.7 g/dL (ref 3.5–5.0)
Alkaline Phosphatase: 52 U/L (ref 38–126)
Anion gap: 10 (ref 5–15)
BUN: 16 mg/dL (ref 8–23)
CO2: 24 mmol/L (ref 22–32)
Calcium: 9.4 mg/dL (ref 8.9–10.3)
Chloride: 109 mmol/L (ref 98–111)
Creatinine: 0.92 mg/dL (ref 0.61–1.24)
GFR, Estimated: >60 mL/min (ref >60)
Glucose, Bld: 111 mg/dL — ABNORMAL HIGH (ref 70–99)
Potassium: 3.9 mmol/L (ref 3.5–5.1)
Sodium: 143 mmol/L (ref 135–145)
Total Bilirubin: 0.6 mg/dL (ref 0.3–1.2)
Total Protein: 5.9 g/dL — ABNORMAL LOW (ref 6.5–8.1)

## 2022-02-26 LAB — MAGNESIUM: Magnesium: 1.8 mg/dL (ref 1.7–2.4)

## 2022-02-26 LAB — CEA (ACCESS): CEA (CHCC): 4.16 ng/mL (ref 0.00–5.00)

## 2022-02-26 MED ORDER — SODIUM CHLORIDE 0.9 % IV SOLN
2000.0000 mg/m2 | INTRAVENOUS | Status: DC
  Administered 2022-02-26: 3850 mg via INTRAVENOUS
  Filled 2022-02-26: qty 77

## 2022-02-26 MED ORDER — SODIUM CHLORIDE 0.9% FLUSH
10.0000 mL | INTRAVENOUS | Status: DC | PRN
  Administered 2022-02-26: 10 mL

## 2022-02-26 NOTE — Progress Notes (Signed)
Patient seen by Lisa Thomas NP today  Vitals are within treatment parameters.  Labs reviewed by Lisa Thomas NP and are within treatment parameters.  Per physician team, patient is ready for treatment and there are NO modifications to the treatment plan.     

## 2022-02-26 NOTE — Progress Notes (Signed)
[facility] OFFICE PROGRESS NOTE   Diagnosis: Rectal cancer  INTERVAL HISTORY:   Neil Perry returns as scheduled.  He completed a cycle of 5-FU 02/05/2022.  He denies nausea/vomiting.  No mouth sores.  He notes frequent bowel movements, occasionally watery.  Overall significantly improved as compared to when he was taking Xeloda.  No hand or foot pain or redness.  Objective:  Vital signs in last 24 hours:  Blood pressure (!) 156/71, pulse 65, temperature 98.1 F (36.7 C), temperature source Oral, resp. rate 18, height _0  (1.753 m), weight 168 lb 3.2 oz (76.3 kg), SpO2 100 %.    HEENT: No thrush or ulcers. Resp: Lungs clear bilaterally. Cardio: Regular rate and rhythm. GI: Abdomen soft and nontender.  No hepatosplenomegaly. Vascular: No leg edema. Skin: Palms without erythema. Port-A-Cath without erythema.   Lab Results:  Lab Results  Component Value Date   WBC 4.1 02/26/2022   HGB 11.7 (L) 02/26/2022   HCT 34.3 (L) 02/26/2022   MCV 89.6 02/26/2022   PLT 213 02/26/2022   NEUTROABS 3.1 02/26/2022    Imaging:  No results found.  Medications: I have reviewed the patient's current medications.  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; foundation 1-microsatellite stable, tumor mutation burden 5, K-rasG 12V, NRAS wildtype Colonoscopy 05/22/2021-ulcerated partially obstructing mass at 15 cm from anal verge CTs 05/30/2021-numerous small pulmonary nodules concerning for metastases, thickening of the rectum, multiple liver metastases Cycle 1 FOLFOX 06/12/2021 Cycle 2 FOLFOX 06/26/2021 Cycle 3 FOLFOX 07/10/2021, oxaliplatin dose reduced  due to progressive decline in the [place] and platelet count Cycle 4 FOLFOX 07/24/2021 Cycle 5 FOLFOX 08/07/2021, 5-FU bolus eliminated and oxaliplatin dose reduced, insurance would not approve Udenyca CTs 08/15/2021-decrease size of lung nodules, decreased hepatic metastases, persistent anorectal wall thickening, stable right renal mass Cycle 6 FOLFOX 08/21/2021, 5-FU bolus and 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 Cycle 7 FOLFOX 09/04/2021, 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 Cycle 8 FOLFOX 09/18/2021, 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 Cycle 9 FOLFOX 10/08/2021, 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 Cycle 10 FOLFOX 10/23/2021, 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 CTs 11/01/2021-decrease in size of occasional small pulmonary nodules, continued decrease in size of multiple hypoenhancing hepatic metastases, unchanged posttreatment appearance of the low rectum, unchanged exophytic mass of the inferior pole of the right kidney measuring 1.5 x 1.3 cm Cycle 11 5-fluorouracil 11/06/2021 Cycle 12 5-fluorouracil 11/20/2021 Cycle 13 5-fluorouracil 12/04/2021 Maintenance Xeloda beginning 12/19/2021 Xeloda dose reduced to 1000 mg twice daily beginning 12/27/2021 Xeloda placed on hold 01/16/2022 CTs 01/22/2022-segment of distal ileum with circumferential wall thickening and perienteric inflammation, decrease in size of liver metastases, stable tiny bilateral pulmonary nodules, circumferential wall thickening in the rectum, stable lower pole right kidney lesion suspicious for renal cell carcinoma Oh Xeloda discontinued due to diarrhea 5-fluorouracil pump 02/05/2022 5-fluorouracil pump 02/26/2022 Prostate cancer-simple prostatectomy January 2019, Gleason 3+3, 10 to 12% of specimen Active surveillance, biopsy May 2019 with small focus of Gleason 3+3 disease in 1/6 cores, surveillance continued Elevated PSA 04/20/2020 Biopsy 06/19/2020 8/12 cores  positive, Gleason 4+4 PET scan 07/07/2020-negative CT 07/07/2020 right iliac and retrocaval adenopathy, 1.3 cm subcapsular right lower pole renal lesion Androgen deprivation therapy beginning 08/21/2020 Radiation to prostate, seminal vesicles, and pelvic lymph  nodes to 10/22 - 12/28/2020 He continues every 63-monthFirmagon and daily Xtandi   3.  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  Mitral valve prolapse 5.  Family history of prostate cancer 6.  08/15/2021 with leg weakness and an episode of slurred speech-TIA?,  Brain imaging negative 7.  Diarrhea and fatigue while on capecitabine April 2023-capecitabine discontinued 01/18/2022      Disposition: Mr. REastridgeappears stable.  He resumed the 5-fluorouracil pump 3 weeks ago.  Diarrhea significantly improved.  Plan to continue the same, treatment today.  CBC and chemistry panel reviewed.  Labs adequate to proceed as above.  He will return for lab, follow-up, 5-fluorouracil in 3 weeks.  We are available to see him sooner if needed.    LNed CardANP/GNP-BC   02/26/2022  11:13 AM

## 2022-02-26 NOTE — Patient Instructions (Signed)
[facility]   Pump removal appointment February 28, 2022 at 12:30 PM Discharge Instructions: Thank you for choosing [facility] to provide your oncology and hematology care.   If you have a lab appointment with the [facility], please go directly to the [facility] and check in at the registration area.   Wear comfortable clothing and clothing appropriate for easy access to any Portacath or PICC line.   We strive to give you quality time with your provider. You may need to reschedule your appointment if you arrive late (15 or more minutes).  Arriving late affects you and other patients whose appointments are after yours.  Also, if you miss three or more appointments without notifying the office, you may be dismissed from the clinic at the provider's discretion.      For prescription refill requests, have your pharmacy contact our office and allow 72 hours for refills to be completed.    Today you received the following chemotherapy and/or immunotherapy agents Flourouracil (ADRUCIL).      To help prevent nausea and vomiting after your treatment, we encourage you to take your nausea medication as directed.  BELOW ARE SYMPTOMS THAT SHOULD BE REPORTED IMMEDIATELY: *FEVER GREATER THAN 100.4 F (38 C) OR HIGHER *CHILLS OR SWEATING *NAUSEA AND VOMITING THAT IS NOT CONTROLLED WITH YOUR NAUSEA MEDICATION *UNUSUAL SHORTNESS OF BREATH *UNUSUAL BRUISING OR BLEEDING *URINARY PROBLEMS (pain or burning when urinating, or frequent urination) *BOWEL PROBLEMS (unusual diarrhea, constipation, pain near the anus) TENDERNESS IN MOUTH AND THROAT WITH OR WITHOUT PRESENCE OF ULCERS (sore throat, sores in mouth, or a toothache) UNUSUAL RASH, SWELLING OR PAIN  UNUSUAL VAGINAL DISCHARGE OR ITCHING   Items with * indicate a potential emergency and should be followed up as soon as possible or go to the Emergency Department if any problems should occur.  Please show the  CHEMOTHERAPY ALERT CARD or IMMUNOTHERAPY ALERT CARD at check-in to the Emergency Department and triage nurse.  Should you have questions after your visit or need to cancel or reschedule your appointment, please contact [facility]  Dept: [phone]  and follow the prompts.  Office hours are 8:00 a.m. to 4:30 p.m. Monday - Friday. Please note that voicemails left after 4:00 p.m. may not be returned until the following business day.  We are closed weekends and major holidays. You have access to a nurse at all times for urgent questions. Please call the main number to the clinic Dept: [phone] and follow the prompts.   For any non-urgent questions, you may also contact your provider using MyChart. We now offer e-Visits for anyone 55 and older to request care online for non-urgent symptoms. For details visit [email or web address].   Also download the MyChart app! Go to the app store, search "MyChart", open the app, select [facility], and log in with your MyChart username and password.  Due to Covid, a mask is required upon entering the hospital/clinic. If you do not have a mask, one will be given to you upon arrival. For doctor visits, patients may have 1 support person aged 40 or older with them. For treatment visits, patients cannot have anyone with them due to current Covid guidelines and our immunocompromised population.   Fluorouracil, 5-FU injection What is this medication? FLUOROURACIL, 5-FU (flure oh YOOR a sil) is a chemotherapy drug. It slows the growth of cancer cells. This medicine is used to treat many types of cancer like breast cancer,  colon or rectal cancer, pancreatic cancer, and stomach cancer. This medicine may be used for other purposes; ask your health care provider or pharmacist if you have questions. COMMON BRAND NAME(S): Adrucil What should I tell my care team before I take this medication? They need to know if you have any of these  conditions: blood disorders dihydropyrimidine dehydrogenase (DPD) deficiency infection (especially a virus infection such as chickenpox, cold sores, or herpes) kidney disease liver disease malnourished, poor nutrition recent or ongoing radiation therapy an unusual or allergic reaction to fluorouracil, other chemotherapy, other medicines, foods, dyes, or preservatives pregnant or trying to get pregnant breast-feeding How should I use this medication? This drug is given as an infusion or injection into a vein. It is administered in a hospital or clinic by a specially trained health care professional. Talk to your pediatrician regarding the use of this medicine in children. Special care may be needed. Overdosage: If you think you have taken too much of this medicine contact a poison control center or emergency room at once. NOTE: This medicine is only for you. Do not share this medicine with others. What if I miss a dose? It is important not to miss your dose. Call your doctor or health care professional if you are unable to keep an appointment. What may interact with this medication? Do not take this medicine with any of the following medications: live virus vaccines This medicine may also interact with the following medications: medicines that treat or prevent blood clots like warfarin, enoxaparin, and dalteparin This list may not describe all possible interactions. Give your health care provider a list of all the medicines, herbs, non-prescription drugs, or dietary supplements you use. Also tell them if you smoke, drink alcohol, or use illegal drugs. Some items may interact with your medicine. What should I watch for while using this medication? Visit your doctor for checks on your progress. This drug may make you feel generally unwell. This is not uncommon, as chemotherapy can affect healthy cells as well as cancer cells. Report any side effects. Continue your course of treatment even  though you feel ill unless your doctor tells you to stop. In some cases, you may be given additional medicines to help with side effects. Follow all directions for their use. Call your doctor or health care professional for advice if you get a fever, chills or sore throat, or other symptoms of a cold or flu. Do not treat yourself. This drug decreases your body's ability to fight infections. Try to avoid being around people who are sick. This medicine may increase your risk to bruise or bleed. Call your doctor or health care professional if you notice any unusual bleeding. Be careful brushing and flossing your teeth or using a toothpick because you may get an infection or bleed more easily. If you have any dental work done, tell your dentist you are receiving this medicine. Avoid taking products that contain aspirin, acetaminophen, ibuprofen, naproxen, or ketoprofen unless instructed by your doctor. These medicines may hide a fever. Do not become pregnant while taking this medicine. Women should inform their doctor if they wish to become pregnant or think they might be pregnant. There is a potential for serious side effects to an unborn child. Talk to your health care professional or pharmacist for more information. Do not breast-feed an infant while taking this medicine. Men should inform their doctor if they wish to father a child. This medicine may lower sperm counts. Do not treat diarrhea  with over the counter products. Contact your doctor if you have diarrhea that lasts more than 2 days or if it is severe and watery. This medicine can make you more sensitive to the sun. Keep out of the sun. If you cannot avoid being in the sun, wear protective clothing and use sunscreen. Do not use sun lamps or tanning beds/booths. What side effects may I notice from receiving this medication? Side effects that you should report to your doctor or health care professional as soon as possible: allergic reactions like  skin rash, itching or hives, swelling of the face, lips, or tongue low blood counts - this medicine may decrease the number of white blood cells, red blood cells and platelets. You may be at increased risk for infections and bleeding. signs of infection - fever or chills, cough, sore throat, pain or difficulty passing urine signs of decreased platelets or bleeding - bruising, pinpoint red spots on the skin, black, tarry stools, blood in the urine signs of decreased red blood cells - unusually weak or tired, fainting spells, lightheadedness breathing problems changes in vision chest pain mouth sores nausea and vomiting pain, swelling, redness at site where injected pain, tingling, numbness in the hands or feet redness, swelling, or sores on hands or feet stomach pain unusual bleeding Side effects that usually do not require medical attention (report to your doctor or health care professional if they continue or are bothersome): changes in finger or toe nails diarrhea dry or itchy skin hair loss headache loss of appetite sensitivity of eyes to the light stomach upset unusually teary eyes This list may not describe all possible side effects. Call your doctor for medical advice about side effects. You may report side effects to FDA at 1-800-FDA-1088. Where should I keep my medication? This drug is given in a hospital or clinic and will not be stored at home. NOTE: This sheet is a summary. It may not cover all possible information. If you have questions about this medicine, talk to your doctor, pharmacist, or health care provider.  2023 [employer]vier/Gold Standard (2021-08-10 00:00:00)  The chemotherapy medication bag should finish at 46 hours, 96 hours, or 7 days. For example, if your pump is scheduled for 46 hours and it was put on at 4:00 p.m., it should finish at 2:00 p.m. the day it is scheduled to come off regardless of your appointment time.     Estimated time to finish at 10:00 a.m.  on Thursday 02/07/2022.   If the display on your pump reads "Low Volume" and it is beeping, take the batteries out of the pump and come to the cancer center for it to be taken off.   If the pump alarms go off prior to the pump reading "Low Volume" then call [phone] and someone can assist you.  If the plunger comes out and the chemotherapy medication is leaking out, please use your home chemo spill kit to clean up the spill. Do NOT use paper towels or other household products.  If you have problems or questions regarding your pump, please call either [phone] (24 hours a day) or the cancer center Monday-Friday 8:00 a.m.- 4:30 p.m. at the clinic number and we will assist you. If you are unable to get assistance, then go to the nearest Emergency Department and ask the staff to contact the IV team for assistance.

## 2022-02-26 NOTE — Patient Instructions (Signed)

## 2022-02-28 ENCOUNTER — Inpatient Hospital Stay: Payer: Medicare Other

## 2022-02-28 VITALS — BP 128/66 | HR 66 | Temp 98.0°F | Resp 20

## 2022-02-28 DIAGNOSIS — C20 Malignant neoplasm of rectum: Secondary | ICD-10-CM

## 2022-02-28 DIAGNOSIS — Z5111 Encounter for antineoplastic chemotherapy: Secondary | ICD-10-CM | POA: Diagnosis not present

## 2022-02-28 MED ORDER — SODIUM CHLORIDE 0.9% FLUSH
10.0000 mL | INTRAVENOUS | Status: DC | PRN
  Administered 2022-02-28: 10 mL

## 2022-02-28 MED ORDER — HEPARIN SOD (PORK) LOCK FLUSH 100 UNIT/ML IV SOLN
500.0000 [IU] | Freq: Once | INTRAVENOUS | Status: AC | PRN
  Administered 2022-02-28: 500 [IU]

## 2022-02-28 NOTE — Patient Instructions (Signed)
Heparin injection ?What is this medication? ?HEPARIN (HEP a rin) is an anticoagulant. It is used to treat or prevent clots in the veins, arteries, lungs, or heart. It stops clots from forming or getting bigger. This medicine prevents clotting during open-heart surgery, dialysis, or in patients who are confined to bed. ?This medicine may be used for other purposes; ask your health care provider or pharmacist if you have questions. ?COMMON BRAND NAME(S): Hep-Lock, Hep-Lock U/P, Hepflush-10, Monoject Prefill Advanced Heparin Lock Flush, SASH Normal Saline and Heparin ?What should I tell my care team before I take this medication? ?They need to know if you have any of these conditions: ?bleeding disorders, such as hemophilia or low blood platelets ?bowel disease or diverticulitis ?endocarditis ?high blood pressure ?liver disease ?recent surgery or delivery of a baby ?stomach ulcers ?an unusual or allergic reaction to heparin, benzyl alcohol, sulfites, other medicines, foods, dyes, or preservatives ?pregnant or trying to get pregnant ?breast-feeding ?How should I use this medication? ?This medicine is given by injection or infusion into a vein. It can also be given by injection of small amounts under the skin. It is usually given by a health care professional in a hospital or clinic setting. ?If you get this medicine at home, you will be taught how to prepare and give this medicine. Use exactly as directed. Take your medicine at regular intervals. Do not take it more often than directed. Do not stop taking except on your doctor's advice. Stopping this medicine may increase your risk of a blot clot. Be sure to refill your prescription before you run out of medicine. ?It is important that you put your used needles and syringes in a special sharps container. Do not put them in a trash can. If you do not have a sharps container, call your pharmacist or healthcare provider to get one. ?Talk to your pediatrician regarding the  use of this medicine in children. While this medicine may be prescribed for children for selected conditions, precautions do apply. ?Overdosage: If you think you have taken too much of this medicine contact a poison control center or emergency room at once. ?NOTE: This medicine is only for you. Do not share this medicine with others. ?What if I miss a dose? ?If you miss a dose, take it as soon as you can. If it is almost time for your next dose, take only that dose. Do not take double or extra doses. ?What may interact with this medication? ?Do not take this medicine with any of the following medications: ?aspirin and aspirin-like drugs ?mifepristone ?medicines that treat or prevent blood clots like warfarin, enoxaparin, and dalteparin ?palifermin ?protamine ?This medicine may also interact with the following medications: ?dextran ?digoxin ?hydroxychloroquine ?medicines for treating colds or allergies ?nicotine ?NSAIDs, medicines for pain and inflammation, like ibuprofen or naproxen ?phenylbutazone ?tetracycline antibiotics ?This list may not describe all possible interactions. Give your health care provider a list of all the medicines, herbs, non-prescription drugs, or dietary supplements you use. Also tell them if you smoke, drink alcohol, or use illegal drugs. Some items may interact with your medicine. ?What should I watch for while using this medication? ?Visit your healthcare professional for regular checks on your progress. You may need blood work done while you are taking this medicine. Your condition will be monitored carefully while you are receiving this medicine. It is important not to miss any appointments. ?Wear a medical ID bracelet or chain, and carry a card that describes your disease and details   of your medicine and dosage times. ?Notify your doctor or healthcare professional at once if you have cold, blue hands or feet. ?If you are going to need surgery or other procedure, tell your healthcare  professional that you are using this medicine. ?Avoid sports and activities that might cause injury while you are using this medicine. Severe falls or injuries can cause unseen bleeding. Be careful when using sharp tools or knives. Consider using an electric razor. Take special care brushing or flossing your teeth. Report any injuries, bruising, or red spots on the skin to your healthcare professional. ?Using this medicine for a long time may weaken your bones and increase the risk of bone fractures. ?You should make sure that you get enough calcium and vitamin D while you are taking this medicine. Discuss the foods you eat and the vitamins you take with your healthcare professional. ?Wear a medical ID bracelet or chain. Carry a card that describes your disease and details of your medicine and dosage times. ?What side effects may I notice from receiving this medication? ?Side effects that you should report to your doctor or health care professional as soon as possible: ?allergic reactions like skin rash, itching or hives, swelling of the face, lips, or tongue ?bone pain ?fever, chills ?nausea, vomiting ?signs and symptoms of bleeding such as bloody or black, tarry stools; red or dark-brown urine; spitting up blood or brown material that looks like coffee grounds; red spots on the skin; unusual bruising or bleeding from the eye, gums, or nose ?signs and symptoms of a blood clot such as chest pain; shortness of breath; pain, swelling, or warmth in the leg ?signs and symptoms of a stroke such as changes in vision; confusion; trouble speaking or understanding; severe headaches; sudden numbness or weakness of the face, arm or leg; trouble walking; dizziness; loss of coordination ?Side effects that usually do not require medical attention (report to your doctor or health care professional if they continue or are bothersome): ?hair loss ?pain, redness, or irritation at site where injected ?This list may not describe all  possible side effects. Call your doctor for medical advice about side effects. You may report side effects to FDA at 1-800-FDA-1088. ?Where should I keep my medication? ?Keep out of the reach of children. ?Store unopened vials at room temperature between 15 and 30 degrees C (59 and 86 degrees F). Do not freeze. Do not use if solution is discolored or particulate matter is present. Throw away any unused medicine after the expiration date. ?NOTE: This sheet is a summary. It may not cover all possible information. If you have questions about this medicine, talk to your doctor, pharmacist, or health care provider. ?? 2023 Elsevier/Gold Standard (2020-10-19 00:00:00) ? ?

## 2022-03-17 ENCOUNTER — Other Ambulatory Visit: Payer: Self-pay | Admitting: Oncology

## 2022-03-18 ENCOUNTER — Encounter: Payer: Self-pay | Admitting: *Deleted

## 2022-03-18 ENCOUNTER — Encounter: Payer: Self-pay | Admitting: Nurse Practitioner

## 2022-03-18 ENCOUNTER — Inpatient Hospital Stay (HOSPITAL_BASED_OUTPATIENT_CLINIC_OR_DEPARTMENT_OTHER): Payer: Medicare Other | Admitting: Nurse Practitioner

## 2022-03-18 ENCOUNTER — Inpatient Hospital Stay: Payer: Medicare Other

## 2022-03-18 VITALS — BP 151/77 | HR 96 | Temp 98.1°F | Resp 20 | Ht 69.0 in | Wt 162.2 lb

## 2022-03-18 DIAGNOSIS — Z95828 Presence of other vascular implants and grafts: Secondary | ICD-10-CM

## 2022-03-18 DIAGNOSIS — Z5111 Encounter for antineoplastic chemotherapy: Secondary | ICD-10-CM | POA: Diagnosis not present

## 2022-03-18 DIAGNOSIS — C20 Malignant neoplasm of rectum: Secondary | ICD-10-CM

## 2022-03-18 LAB — CMP (CANCER CENTER ONLY)
ALT: 8 U/L (ref 0–44)
AST: 11 U/L — ABNORMAL LOW (ref 15–41)
Albumin: 3.9 g/dL (ref 3.5–5.0)
Alkaline Phosphatase: 73 U/L (ref 38–126)
Anion gap: 11 (ref 5–15)
BUN: 18 mg/dL (ref 8–23)
CO2: 25 mmol/L (ref 22–32)
Calcium: 9.6 mg/dL (ref 8.9–10.3)
Chloride: 106 mmol/L (ref 98–111)
Creatinine: 1.08 mg/dL (ref 0.61–1.24)
GFR, Estimated: >60 mL/min (ref >60)
Glucose, Bld: 130 mg/dL — ABNORMAL HIGH (ref 70–99)
Potassium: 3.4 mmol/L — ABNORMAL LOW (ref 3.5–5.1)
Sodium: 142 mmol/L (ref 135–145)
Total Bilirubin: 0.8 mg/dL (ref 0.3–1.2)
Total Protein: 6.2 g/dL — ABNORMAL LOW (ref 6.5–8.1)

## 2022-03-18 LAB — CBC WITH DIFFERENTIAL (CANCER CENTER ONLY)
Abs Immature Granulocytes: 0.02 10*3/uL (ref 0.00–0.07)
Basophils Absolute: 0 10*3/uL (ref 0.0–0.1)
Basophils Relative: 0 %
Eosinophils Absolute: 0.2 10*3/uL (ref 0.0–0.5)
Eosinophils Relative: 3 %
HCT: 38.1 % — ABNORMAL LOW (ref 39.0–52.0)
Hemoglobin: 13.1 g/dL (ref 13.0–17.0)
Immature Granulocytes: 0 %
Lymphocytes Relative: 11 %
Lymphs Abs: 0.7 10*3/uL (ref 0.7–4.0)
MCH: 29.4 pg (ref 26.0–34.0)
MCHC: 34.4 g/dL (ref 30.0–36.0)
MCV: 85.4 fL (ref 80.0–100.0)
Monocytes Absolute: 0.5 10*3/uL (ref 0.1–1.0)
Monocytes Relative: 9 %
Neutro Abs: 4.6 10*3/uL (ref 1.7–7.7)
Neutrophils Relative %: 77 %
Platelet Count: 274 10*3/uL (ref 150–400)
RBC: 4.46 MIL/uL (ref 4.22–5.81)
RDW: 13.3 % (ref 11.5–15.5)
WBC Count: 6 10*3/uL (ref 4.0–10.5)
nRBC: 0 % (ref 0.0–0.2)

## 2022-03-18 LAB — CEA (ACCESS): CEA (CHCC): 7.92 ng/mL — ABNORMAL HIGH (ref 0.00–5.00)

## 2022-03-18 LAB — MAGNESIUM: Magnesium: 2 mg/dL (ref 1.7–2.4)

## 2022-03-18 MED ORDER — HEPARIN SOD (PORK) LOCK FLUSH 100 UNIT/ML IV SOLN
500.0000 [IU] | Freq: Once | INTRAVENOUS | Status: AC
  Administered 2022-03-18: 500 [IU] via INTRAVENOUS

## 2022-03-18 MED ORDER — SODIUM CHLORIDE 0.9% FLUSH
10.0000 mL | INTRAVENOUS | Status: AC | PRN
  Administered 2022-03-18: 10 mL via INTRAVENOUS

## 2022-03-18 NOTE — Progress Notes (Signed)
Patient seen by Lonna Cobb NP today  Vitals are within treatment parameters.  Labs reviewed by Lonna Cobb NP and are within treatment parameters.  Per physician team, patient will not be receiving treatment today.  Will not be receiving treatment today due to diarrhea and weight loss.

## 2022-03-19 ENCOUNTER — Other Ambulatory Visit (HOSPITAL_BASED_OUTPATIENT_CLINIC_OR_DEPARTMENT_OTHER): Payer: Self-pay

## 2022-03-19 ENCOUNTER — Telehealth: Payer: Self-pay

## 2022-03-19 DIAGNOSIS — Z5111 Encounter for antineoplastic chemotherapy: Secondary | ICD-10-CM | POA: Diagnosis not present

## 2022-03-19 DIAGNOSIS — C20 Malignant neoplasm of rectum: Secondary | ICD-10-CM

## 2022-03-19 LAB — C DIFFICILE QUICK SCREEN W PCR REFLEX
C Diff antigen: NEGATIVE
C Diff interpretation: NOT DETECTED
C Diff toxin: NEGATIVE

## 2022-03-19 NOTE — Telephone Encounter (Signed)
Called and spoke with the patient. He stated last night he had abdominal cramping and  a small amount diarrhea. This morning no abdominal cramping or  no diarrhea. Patient denied any pain, shortness of breath, chest pain, or fever.

## 2022-03-20 ENCOUNTER — Inpatient Hospital Stay: Payer: Medicare Other

## 2022-03-20 ENCOUNTER — Other Ambulatory Visit: Payer: Self-pay | Admitting: Nurse Practitioner

## 2022-03-20 ENCOUNTER — Telehealth: Payer: Self-pay

## 2022-03-20 DIAGNOSIS — C20 Malignant neoplasm of rectum: Secondary | ICD-10-CM

## 2022-03-20 MED ORDER — DIPHENOXYLATE-ATROPINE 2.5-0.025 MG PO TABS: 1.0000 | ORAL_TABLET | Freq: Four times a day (QID) | ORAL | 0 refills | Status: DC | PRN

## 2022-03-20 NOTE — Telephone Encounter (Signed)
-----   Message from Owens Shark, NP sent at 03/20/2022  7:37 AM EDT ----- Please let him know stool negative for cdiff. I am sending a prescription for lomotil to his pharmacy.

## 2022-03-20 NOTE — Telephone Encounter (Signed)
Called an informed patient of information below.  Patient verbalized understanding.  Reminded patient of appointments scheduled for 03/25/22 and instructed patient to contact office with any questions or concerns.  Patient verbalized understanding.  All questions were answered during phone call.

## 2022-03-25 ENCOUNTER — Inpatient Hospital Stay: Payer: Medicare Other | Attending: Nurse Practitioner

## 2022-03-25 ENCOUNTER — Encounter: Payer: Self-pay | Admitting: Nurse Practitioner

## 2022-03-25 ENCOUNTER — Encounter: Payer: Self-pay | Admitting: *Deleted

## 2022-03-25 ENCOUNTER — Inpatient Hospital Stay: Payer: Medicare Other

## 2022-03-25 ENCOUNTER — Inpatient Hospital Stay (HOSPITAL_BASED_OUTPATIENT_CLINIC_OR_DEPARTMENT_OTHER): Payer: Medicare Other | Admitting: Nurse Practitioner

## 2022-03-25 VITALS — BP 160/72 | HR 80 | Temp 98.1°F | Resp 20 | Ht 69.0 in | Wt 165.0 lb

## 2022-03-25 DIAGNOSIS — C787 Secondary malignant neoplasm of liver and intrahepatic bile duct: Secondary | ICD-10-CM | POA: Diagnosis not present

## 2022-03-25 DIAGNOSIS — Z8042 Family history of malignant neoplasm of prostate: Secondary | ICD-10-CM | POA: Diagnosis not present

## 2022-03-25 DIAGNOSIS — C20 Malignant neoplasm of rectum: Secondary | ICD-10-CM

## 2022-03-25 DIAGNOSIS — Z85528 Personal history of other malignant neoplasm of kidney: Secondary | ICD-10-CM | POA: Insufficient documentation

## 2022-03-25 DIAGNOSIS — R197 Diarrhea, unspecified: Secondary | ICD-10-CM | POA: Insufficient documentation

## 2022-03-25 DIAGNOSIS — Z923 Personal history of irradiation: Secondary | ICD-10-CM | POA: Insufficient documentation

## 2022-03-25 DIAGNOSIS — Z5111 Encounter for antineoplastic chemotherapy: Secondary | ICD-10-CM | POA: Insufficient documentation

## 2022-03-25 LAB — CBC WITH DIFFERENTIAL (CANCER CENTER ONLY)
Abs Immature Granulocytes: 0.01 10*3/uL (ref 0.00–0.07)
Basophils Absolute: 0 10*3/uL (ref 0.0–0.1)
Basophils Relative: 0 %
Eosinophils Absolute: 0.2 10*3/uL (ref 0.0–0.5)
Eosinophils Relative: 4 %
HCT: 35.2 % — ABNORMAL LOW (ref 39.0–52.0)
Hemoglobin: 12 g/dL — ABNORMAL LOW (ref 13.0–17.0)
Immature Granulocytes: 0 %
Lymphocytes Relative: 12 %
Lymphs Abs: 0.5 10*3/uL — ABNORMAL LOW (ref 0.7–4.0)
MCH: 29.2 pg (ref 26.0–34.0)
MCHC: 34.1 g/dL (ref 30.0–36.0)
MCV: 85.6 fL (ref 80.0–100.0)
Monocytes Absolute: 0.3 10*3/uL (ref 0.1–1.0)
Monocytes Relative: 7 %
Neutro Abs: 3.6 10*3/uL (ref 1.7–7.7)
Neutrophils Relative %: 77 %
Platelet Count: 241 10*3/uL (ref 150–400)
RBC: 4.11 MIL/uL — ABNORMAL LOW (ref 4.22–5.81)
RDW: 13.3 % (ref 11.5–15.5)
WBC Count: 4.6 10*3/uL (ref 4.0–10.5)
nRBC: 0 % (ref 0.0–0.2)

## 2022-03-25 LAB — CMP (CANCER CENTER ONLY)
ALT: 9 U/L (ref 0–44)
AST: 11 U/L — ABNORMAL LOW (ref 15–41)
Albumin: 4 g/dL (ref 3.5–5.0)
Alkaline Phosphatase: 60 U/L (ref 38–126)
Anion gap: 9 (ref 5–15)
BUN: 18 mg/dL (ref 8–23)
CO2: 27 mmol/L (ref 22–32)
Calcium: 9.5 mg/dL (ref 8.9–10.3)
Chloride: 107 mmol/L (ref 98–111)
Creatinine: 0.98 mg/dL (ref 0.61–1.24)
GFR, Estimated: >60 mL/min (ref >60)
Glucose, Bld: 103 mg/dL — ABNORMAL HIGH (ref 70–99)
Potassium: 3.8 mmol/L (ref 3.5–5.1)
Sodium: 143 mmol/L (ref 135–145)
Total Bilirubin: 0.4 mg/dL (ref 0.3–1.2)
Total Protein: 6.1 g/dL — ABNORMAL LOW (ref 6.5–8.1)

## 2022-03-25 LAB — MAGNESIUM: Magnesium: 2 mg/dL (ref 1.7–2.4)

## 2022-03-25 LAB — CEA (ACCESS): CEA (CHCC): 10.9 ng/mL — ABNORMAL HIGH (ref 0.00–5.00)

## 2022-03-25 IMAGING — MR MR ABDOMEN WO/W CM
18 series · 48 of 48 positions shown · IV contrast (9 GADAVIST)
Comparison: 07/25/2020

CLINICAL DATA: Follow-up right renal mass. Personal history of
prostate carcinoma.

EXAM:
MRI ABDOMEN WITHOUT AND WITH CONTRAST
TECHNIQUE: Multiplanar multisequence MR imaging of the abdomen was performed
both before and after the administration of intravenous contrast.
CONTRAST:  9mL GADAVIST GADOBUTROL 1 MMOL/ML IV SOLN

[Series 2: DWI · axial · 6.0mm · 1.49mm/px · z∈[-78,+174]mm · 4 of 72 slices shown (1 of 2)]
[im 1/72]
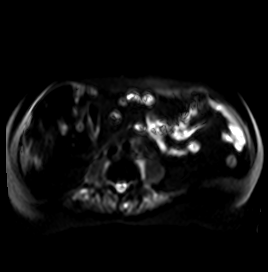
[im 24/72]
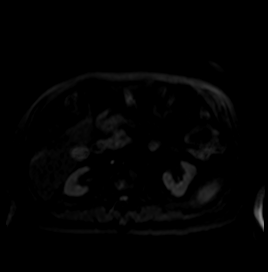
[im 48/72]
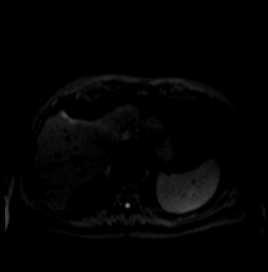
[im 72/72]
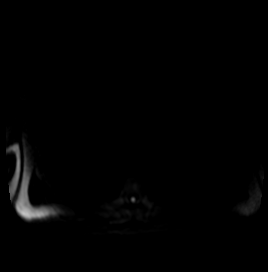

[Series 3: DWI · axial · 6.0mm · 1.49mm/px · z∈[-78,+174]mm · 2 of 36 slices shown (2 of 2)]
[im 1/36]
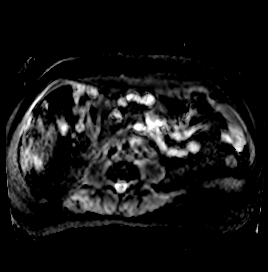
[im 36/36]
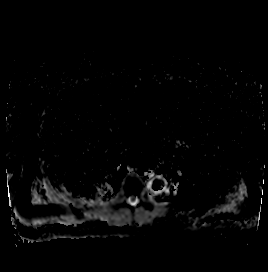

[Series 4: T2 fat-sat · axial · 6.0mm · 1.25mm/px · z∈[-97,+169]mm · 2 of 38 slices shown]
[im 1/38]
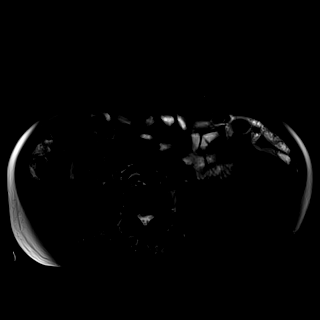
[im 38/38]
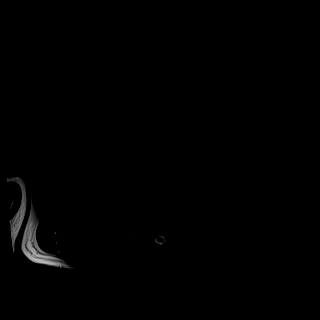

[Series 7: T2 · coronal · 6.0mm · 1.56mm/px · 2 of 39 slices shown (1 of 2)]
[im 1/39]
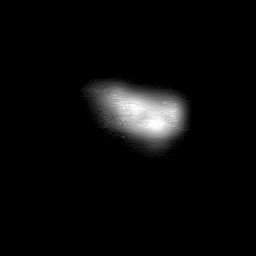
[im 39/39]
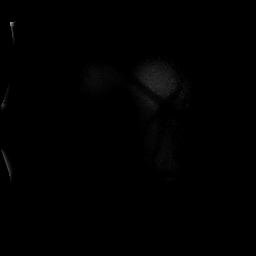

[Series 8: T1 · axial · 3.0mm · 1.25mm/px · z∈[-89,+172]mm · 3 of 88 slices shown (1 of 2)]
[im 1/88]
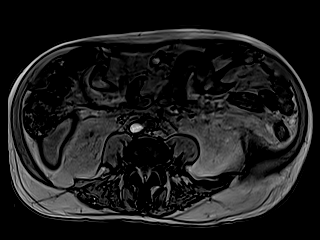
[im 44/88]
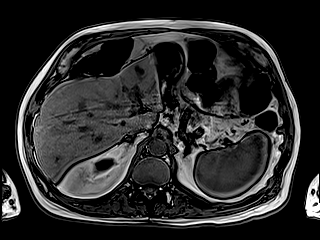
[im 88/88]
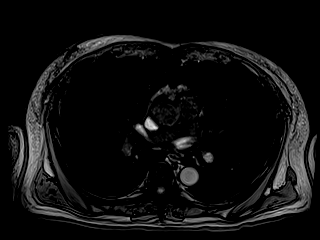

[Series 9: T1 · axial · 3.0mm · 1.25mm/px · z∈[-89,+172]mm · 3 of 88 slices shown (2 of 2)]
[im 1/88]
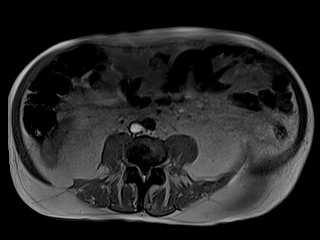
[im 44/88]
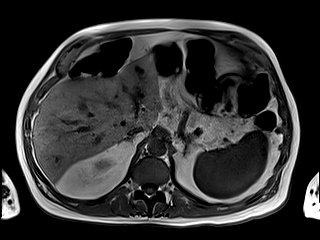
[im 88/88]
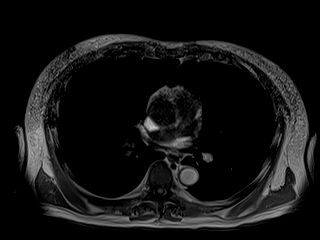

[Series 10: bSSFP · axial · 5.0mm · 0.84mm/px · z∈[-86,+167]mm · 2 of 47 slices shown]
[im 1/47]
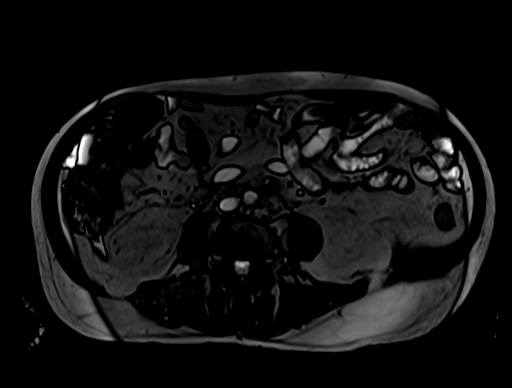
[im 47/47]
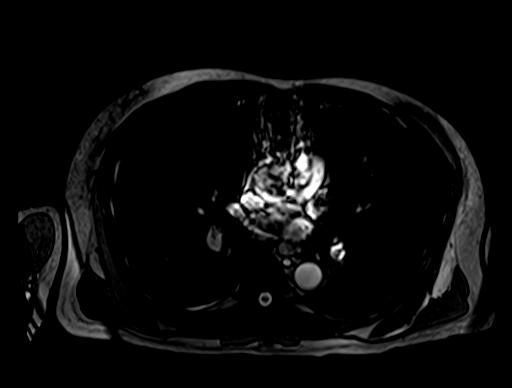

[Series 12: T1 dynamic · axial · 3.0mm · 1.25mm/px · z∈[-92,+169]mm · 3 of 88 slices shown (1 of 10)]
[im 1/88]
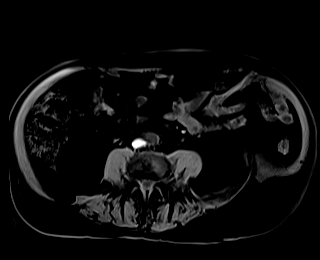
[im 44/88]
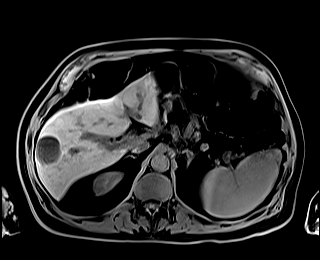
[im 88/88]
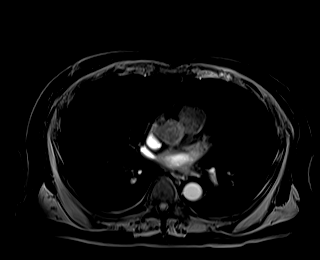

[Series 16: T1 dynamic · axial · 3.0mm · 1.25mm/px · z∈[-92,+169]mm · 3 of 88 slices shown (2 of 10)]
[im 1/88]
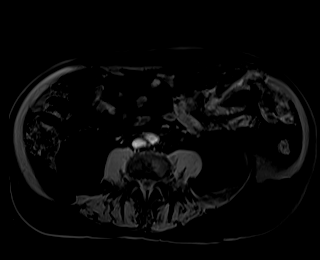
[im 44/88]
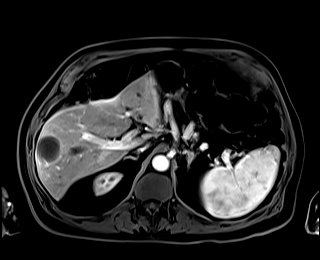
[im 88/88]
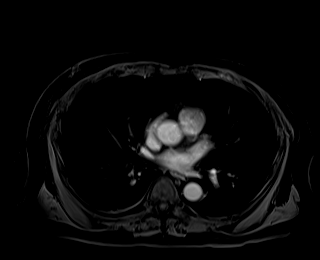

[Series 17: T1 dynamic · axial · 3.0mm · 1.25mm/px · z∈[-92,+169]mm · 3 of 88 slices shown (3 of 10)]
[im 1/88]
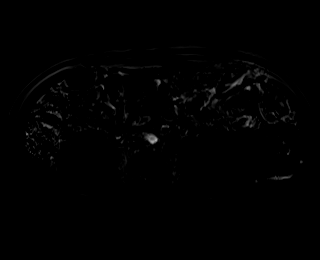
[im 44/88]
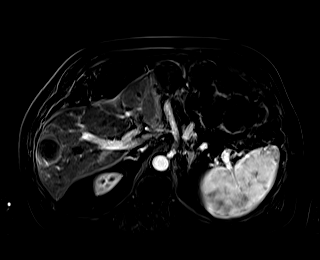
[im 88/88]
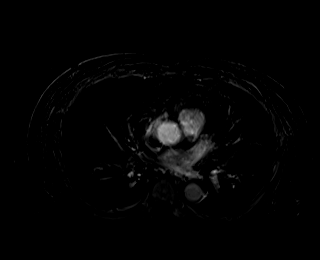

[Series 20: T1 dynamic · axial · 3.0mm · 1.25mm/px · z∈[-92,+169]mm · 3 of 88 slices shown (4 of 10)]
[im 1/88]
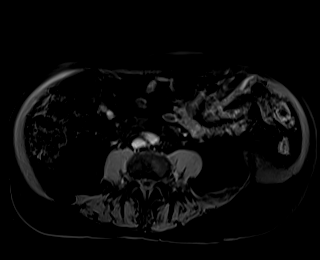
[im 44/88]
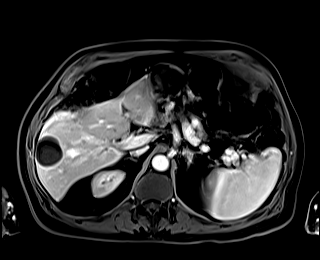
[im 88/88]
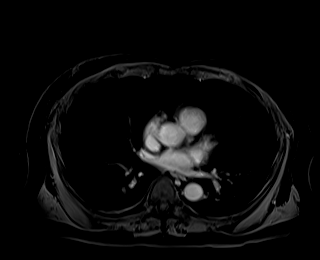

[Series 21: T1 dynamic · axial · 3.0mm · 1.25mm/px · z∈[-92,+169]mm · 3 of 88 slices shown (5 of 10)]
[im 1/88]
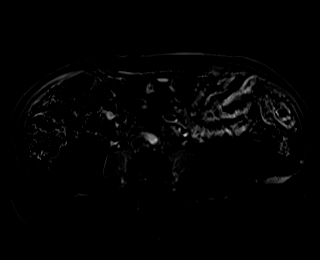
[im 44/88]
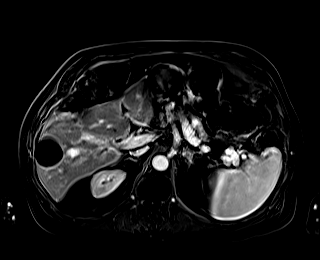
[im 88/88]
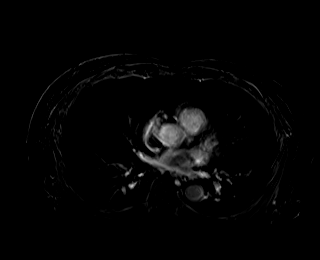

[Series 24: T1 dynamic · axial · 3.0mm · 1.25mm/px · z∈[-92,+169]mm · 3 of 88 slices shown (6 of 10)]
[im 1/88]
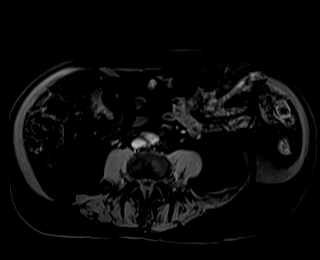
[im 44/88]
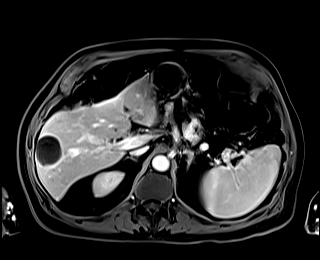
[im 88/88]
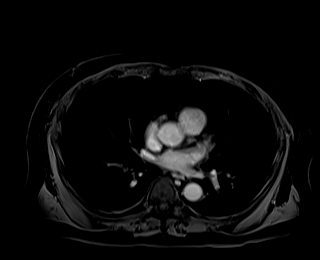

[Series 25: T1 dynamic · axial · 3.0mm · 1.25mm/px · z∈[-92,+169]mm · 3 of 88 slices shown (7 of 10)]
[im 1/88]
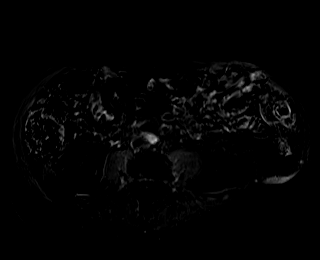
[im 44/88]
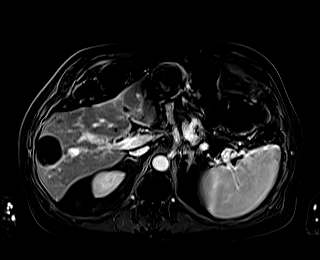
[im 88/88]
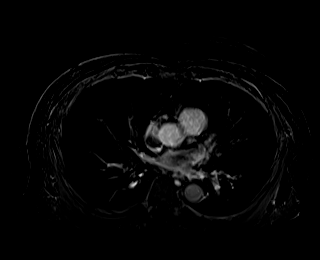

[Series 27: T1 dynamic · coronal · 5.0mm · 1.41mm/px · 2 of 56 slices shown (8 of 10)]
[im 1/56]
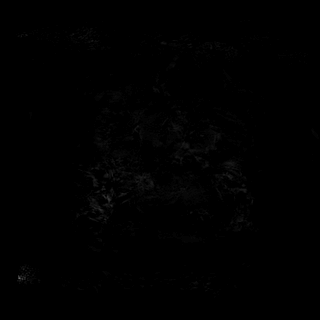
[im 56/56]
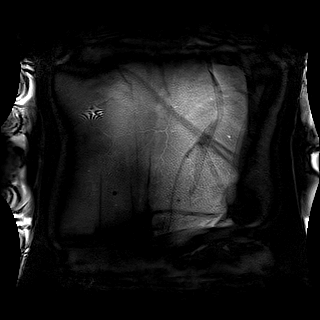

[Series 28: T2 · axial · 6.0mm · 1.56mm/px · 1 of 37 slices shown (2 of 2)]
[im 1/37]
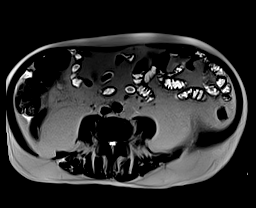

[Series 31: T1 dynamic · axial · 3.0mm · 1.25mm/px · z∈[-92,+169]mm · 3 of 88 slices shown (9 of 10)]
[im 1/88]
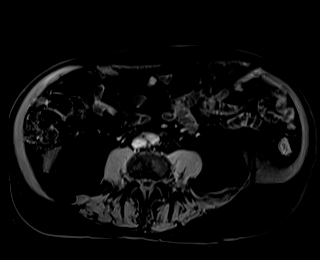
[im 44/88]
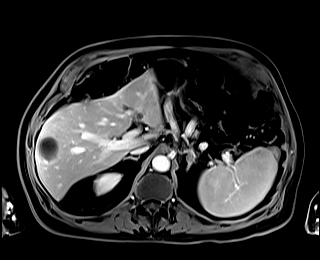
[im 88/88]
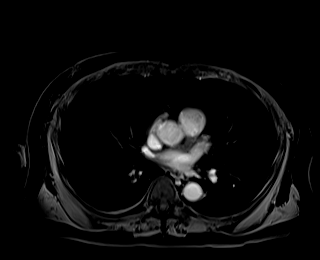

[Series 32: T1 dynamic · axial · 3.0mm · 1.25mm/px · z∈[-92,+169]mm · 3 of 88 slices shown (10 of 10)]
[im 1/88]
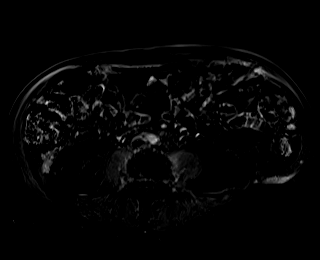
[im 44/88]
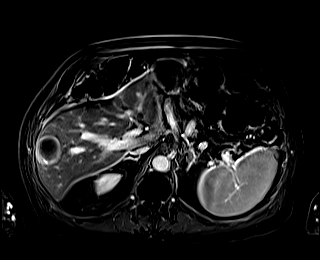
[im 88/88]
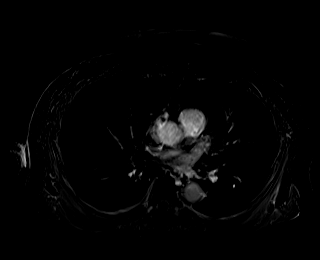

[48 of 48 positions shown; findings below may reference images not displayed]

FINDINGS: Lower chest: No acute findings.

Hepatobiliary: Multiple small cysts are again seen in both the right
and left hepatic lobes. Three new hypovascular masses are seen in
the liver dome in both the right and left lobes, largest measuring
5.0 x 4.8 cm on image 25/16. These are consistent with liver
metastases. Gallbladder is unremarkable. No evidence of biliary
ductal dilatation.

Pancreas:  No mass or inflammatory changes.

Spleen:  Within normal limits in size and appearance.

Adrenals/Urinary Tract: 12 mm left adrenal mass remains stable and
shows signal dropout on chemical shift imaging, consistent with a
benign adenoma. A heterogeneously enhancing subcapsular mass is
again seen in the posterior lower pole the right kidney which
measures 1.4 x 1.2 cm, without significant change compared to recent
study. This is highly suspicious for renal cell carcinoma. No other
renal masses identified. No evidence of hydronephrosis.

Stomach/Bowel: Visualized portion unremarkable.

Vascular/Lymphatic: No pathologically enlarged lymph nodes
identified. No acute vascular findings.

Other:  None.

Musculoskeletal:  No suspicious bone lesions identified.
IMPRESSION: Stable 1.4 cm enhancing subcapsular mass in the lower pole of the
right kidney, highly suspicious for renal cell carcinoma.

No evidence of abdominal lymphadenopathy.

Three new hypovascular masses in the liver dome, largest measuring 5
cm, consistent with metastatic disease.

Stable 12 mm benign left adrenal adenoma.

## 2022-03-25 MED ORDER — SODIUM CHLORIDE 0.9 % IV SOLN
1600.0000 mg/m2 | INTRAVENOUS | Status: DC
  Administered 2022-03-25: 3050 mg via INTRAVENOUS
  Filled 2022-03-25: qty 61

## 2022-03-25 NOTE — Patient Instructions (Signed)
[facility]   Discharge Instructions: Thank you for choosing [facility] to provide your oncology and hematology care.   If you have a lab appointment with the [facility], please go directly to the [facility] and check in at the registration area.   Wear comfortable clothing and clothing appropriate for easy access to any Portacath or PICC line.   We strive to give you quality time with your provider. You may need to reschedule your appointment if you arrive late (15 or more minutes).  Arriving late affects you and other patients whose appointments are after yours.  Also, if you miss three or more appointments without notifying the office, you may be dismissed from the clinic at the provider's discretion.      For prescription refill requests, have your pharmacy contact our office and allow 72 hours for refills to be completed.    Today you received the following chemotherapy and/or immunotherapy agents Flourouracil (ADRUCIL).      To help prevent nausea and vomiting after your treatment, we encourage you to take your nausea medication as directed.  BELOW ARE SYMPTOMS THAT SHOULD BE REPORTED IMMEDIATELY: *FEVER GREATER THAN 100.4 F (38 C) OR HIGHER *CHILLS OR SWEATING *NAUSEA AND VOMITING THAT IS NOT CONTROLLED WITH YOUR NAUSEA MEDICATION *UNUSUAL SHORTNESS OF BREATH *UNUSUAL BRUISING OR BLEEDING *URINARY PROBLEMS (pain or burning when urinating, or frequent urination) *BOWEL PROBLEMS (unusual diarrhea, constipation, pain near the anus) TENDERNESS IN MOUTH AND THROAT WITH OR WITHOUT PRESENCE OF ULCERS (sore throat, sores in mouth, or a toothache) UNUSUAL RASH, SWELLING OR PAIN  UNUSUAL VAGINAL DISCHARGE OR ITCHING   Items with * indicate a potential emergency and should be followed up as soon as possible or go to the Emergency Department if any problems should occur.  Please show the CHEMOTHERAPY ALERT CARD or IMMUNOTHERAPY ALERT CARD at  check-in to the Emergency Department and triage nurse.  Should you have questions after your visit or need to cancel or reschedule your appointment, please contact [facility]  Dept: [phone]  and follow the prompts.  Office hours are 8:00 a.m. to 4:30 p.m. Monday - Friday. Please note that voicemails left after 4:00 p.m. may not be returned until the following business day.  We are closed weekends and major holidays. You have access to a nurse at all times for urgent questions. Please call the main number to the clinic Dept: [phone] and follow the prompts.   For any non-urgent questions, you may also contact your provider using MyChart. We now offer e-Visits for anyone 4 and older to request care online for non-urgent symptoms. For details visit [email or web address].   Also download the MyChart app! Go to the app store, search "MyChart", open the app, select [facility], and log in with your MyChart username and password.  Masks are optional in the cancer centers. If you would like for your care team to wear a mask while they are taking care of you, please let them know. For doctor visits, patients may have with them one support person who is at least 82 years old. At this time, visitors are not allowed in the infusion area.  Fluorouracil, 5-FU injection What is this medication? FLUOROURACIL, 5-FU (flure oh YOOR a sil) is a chemotherapy drug. It slows the growth of cancer cells. This medicine is used to treat many types of cancer like breast cancer, colon or rectal cancer, pancreatic cancer, and stomach cancer. This  medicine may be used for other purposes; ask your health care provider or pharmacist if you have questions. COMMON BRAND NAME(S): Adrucil What should I tell my care team before I take this medication? They need to know if you have any of these conditions: blood disorders dihydropyrimidine dehydrogenase (DPD) deficiency infection (especially a  virus infection such as chickenpox, cold sores, or herpes) kidney disease liver disease malnourished, poor nutrition recent or ongoing radiation therapy an unusual or allergic reaction to fluorouracil, other chemotherapy, other medicines, foods, dyes, or preservatives pregnant or trying to get pregnant breast-feeding How should I use this medication? This drug is given as an infusion or injection into a vein. It is administered in a hospital or clinic by a specially trained health care professional. Talk to your pediatrician regarding the use of this medicine in children. Special care may be needed. Overdosage: If you think you have taken too much of this medicine contact a poison control center or emergency room at once. NOTE: This medicine is only for you. Do not share this medicine with others. What if I miss a dose? It is important not to miss your dose. Call your doctor or health care professional if you are unable to keep an appointment. What may interact with this medication? Do not take this medicine with any of the following medications: live virus vaccines This medicine may also interact with the following medications: medicines that treat or prevent blood clots like warfarin, enoxaparin, and dalteparin This list may not describe all possible interactions. Give your health care provider a list of all the medicines, herbs, non-prescription drugs, or dietary supplements you use. Also tell them if you smoke, drink alcohol, or use illegal drugs. Some items may interact with your medicine. What should I watch for while using this medication? Visit your doctor for checks on your progress. This drug may make you feel generally unwell. This is not uncommon, as chemotherapy can affect healthy cells as well as cancer cells. Report any side effects. Continue your course of treatment even though you feel ill unless your doctor tells you to stop. In some cases, you may be given additional  medicines to help with side effects. Follow all directions for their use. Call your doctor or health care professional for advice if you get a fever, chills or sore throat, or other symptoms of a cold or flu. Do not treat yourself. This drug decreases your body's ability to fight infections. Try to avoid being around people who are sick. This medicine may increase your risk to bruise or bleed. Call your doctor or health care professional if you notice any unusual bleeding. Be careful brushing and flossing your teeth or using a toothpick because you may get an infection or bleed more easily. If you have any dental work done, tell your dentist you are receiving this medicine. Avoid taking products that contain aspirin, acetaminophen, ibuprofen, naproxen, or ketoprofen unless instructed by your doctor. These medicines may hide a fever. Do not become pregnant while taking this medicine. Women should inform their doctor if they wish to become pregnant or think they might be pregnant. There is a potential for serious side effects to an unborn child. Talk to your health care professional or pharmacist for more information. Do not breast-feed an infant while taking this medicine. Men should inform their doctor if they wish to father a child. This medicine may lower sperm counts. Do not treat diarrhea with over the counter products. Contact your doctor if you  have diarrhea that lasts more than 2 days or if it is severe and watery. This medicine can make you more sensitive to the sun. Keep out of the sun. If you cannot avoid being in the sun, wear protective clothing and use sunscreen. Do not use sun lamps or tanning beds/booths. What side effects may I notice from receiving this medication? Side effects that you should report to your doctor or health care professional as soon as possible: allergic reactions like skin rash, itching or hives, swelling of the face, lips, or tongue low blood counts - this medicine  may decrease the number of white blood cells, red blood cells and platelets. You may be at increased risk for infections and bleeding. signs of infection - fever or chills, cough, sore throat, pain or difficulty passing urine signs of decreased platelets or bleeding - bruising, pinpoint red spots on the skin, black, tarry stools, blood in the urine signs of decreased red blood cells - unusually weak or tired, fainting spells, lightheadedness breathing problems changes in vision chest pain mouth sores nausea and vomiting pain, swelling, redness at site where injected pain, tingling, numbness in the hands or feet redness, swelling, or sores on hands or feet stomach pain unusual bleeding Side effects that usually do not require medical attention (report to your doctor or health care professional if they continue or are bothersome): changes in finger or toe nails diarrhea dry or itchy skin hair loss headache loss of appetite sensitivity of eyes to the light stomach upset unusually teary eyes This list may not describe all possible side effects. Call your doctor for medical advice about side effects. You may report side effects to FDA at 1-800-FDA-1088. Where should I keep my medication? This drug is given in a hospital or clinic and will not be stored at home. NOTE: This sheet is a summary. It may not cover all possible information. If you have questions about this medicine, talk to your doctor, pharmacist, or health care provider.  2023 Elsevier/Gold Standard (2021-08-10 00:00:00) The chemotherapy medication bag should finish at 46 hours, 96 hours, or 7 days. For example, if your pump is scheduled for 46 hours and it was put on at 4:00 p.m., it should finish at 2:00 p.m. the day it is scheduled to come off regardless of your appointment time.     Estimated time to finish at 12:00 p.m. on Wednesday 03/27/2022.   If the display on your pump reads "Low Volume" and it is beeping, take the  batteries out of the pump and come to the cancer center for it to be taken off.   If the pump alarms go off prior to the pump reading "Low Volume" then call [phone] and someone can assist you.  If the plunger comes out and the chemotherapy medication is leaking out, please use your home chemo spill kit to clean up the spill. Do NOT use paper towels or other household products.  If you have problems or questions regarding your pump, please call either [phone] (24 hours a day) or the cancer center Monday-Friday 8:00 a.m.- 4:30 p.m. at the clinic number and we will assist you. If you are unable to get assistance, then go to the nearest Emergency Department and ask the staff to contact the IV team for assistance.

## 2022-03-25 NOTE — Progress Notes (Addendum)
Patient seen by Ned Card NP today  Vitals are within treatment parameters.  Labs reviewed by Ned Card NP and are within treatment parameters.  Per physician team, patient is ready for treatment. Please note that modifications are being made to the treatment plan including MD is dose reducing 5FU pump to 1600 mg/m2  due to intermittent diarrhea.

## 2022-03-25 NOTE — Progress Notes (Signed)
[facility] OFFICE PROGRESS NOTE   Diagnosis: Rectal cancer  INTERVAL HISTORY:   Neil Perry returns as scheduled.  Treatment was held last week due to diarrhea, abdominal cramps.  Stool negative for C. difficile.  Lomotil prescribed.  He began Lomotil yesterday, took 1 dose.  He estimates 4 loose stools yesterday.  This morning stool was more formed.  No nausea or vomiting.  No mouth sores.  No hand or foot pain or redness.  Objective:  Vital signs in last 24 hours:  Blood pressure (!) 160/72, pulse 80, temperature 98.1 F (36.7 C), temperature source Oral, resp. rate 20, height 5' 9"  (1.753 m), weight 165 lb (74.8 kg), SpO2 98 %.    HEENT: No thrush or ulcers. Resp: Lungs clear bilaterally. Cardio: Regular rate and rhythm. GI: Abdomen soft and nontender.  No hepatomegaly. Vascular: No leg edema. Skin: Palms without erythema. Port-A-Cath without erythema   Lab Results:  Lab Results  Component Value Date   WBC 4.6 03/25/2022   HGB 12.0 (L) 03/25/2022   HCT 35.2 (L) 03/25/2022   MCV 85.6 03/25/2022   PLT 241 03/25/2022   NEUTROABS 3.6 03/25/2022    Imaging:  No results found.  Medications: I have reviewed the patient's current medications.  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; foundation 1-microsatellite stable, tumor mutation burden 5, K-rasG 12V, NRAS wildtype Colonoscopy 05/22/2021-ulcerated partially obstructing mass at 15 cm from anal verge CTs 05/30/2021-numerous small pulmonary nodules concerning for metastases, thickening of the rectum, multiple liver metastases Cycle 1 FOLFOX 06/12/2021 Cycle 2 FOLFOX  06/26/2021 Cycle 3 FOLFOX 07/10/2021, oxaliplatin dose reduced due to progressive decline in the [place] and platelet count Cycle 4 FOLFOX 07/24/2021 Cycle 5 FOLFOX 08/07/2021, 5-FU bolus eliminated and oxaliplatin dose reduced, insurance would not approve Udenyca CTs 08/15/2021-decrease size of lung nodules, decreased hepatic metastases, persistent anorectal wall thickening, stable right renal mass Cycle 6 FOLFOX 08/21/2021, 5-FU bolus and 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 Cycle 7 FOLFOX 09/04/2021, 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 Cycle 8 FOLFOX 09/18/2021, 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 Cycle 9 FOLFOX 10/08/2021, 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 Cycle 10 FOLFOX 10/23/2021, 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 CTs 11/01/2021-decrease in size of occasional small pulmonary nodules, continued decrease in size of multiple hypoenhancing hepatic metastases, unchanged posttreatment appearance of the low rectum, unchanged exophytic mass of the inferior pole of the right kidney measuring 1.5 x 1.3 cm Cycle 11 5-fluorouracil 11/06/2021 Cycle 12 5-fluorouracil 11/20/2021 Cycle 13 5-fluorouracil 12/04/2021 Maintenance Xeloda beginning 12/19/2021 Xeloda dose reduced to 1000 mg twice daily beginning 12/27/2021 Xeloda placed on hold 01/16/2022 CTs 01/22/2022-segment of distal ileum with circumferential wall thickening and perienteric inflammation, decrease in size of liver metastases, stable tiny bilateral pulmonary nodules, circumferential wall thickening in the rectum, stable lower pole right kidney lesion suspicious for renal cell carcinoma Xeloda discontinued due to diarrhea 5-fluorouracil pump 02/05/2022 5-fluorouracil pump 02/26/2022 Treatment held 03/18/2022 due to diarrhea 5-fluorouracil pump 03/25/2022, dose reduced due to diarrhea Prostate cancer-simple prostatectomy January 2019, Gleason 3+3, 10 to 12% of specimen Active  surveillance, biopsy May 2019 with small focus of Gleason 3+3 disease in 1/6 cores, surveillance continued Elevated PSA 04/20/2020 Biopsy 06/19/2020 8/12 cores positive, Gleason 4+4 PET scan 07/07/2020-negative CT 07/07/2020  right iliac and retrocaval adenopathy, 1.3 cm subcapsular right lower pole renal lesion Androgen deprivation therapy beginning 08/21/2020 Radiation to prostate, seminal vesicles, and pelvic lymph nodes to 10/22 - 12/28/2020 He continues every 27-monthFirmagon and daily Xtandi   3.  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  Mitral valve prolapse 5.  Family history of prostate cancer 6.  08/15/2021 with leg weakness and an episode of slurred speech-TIA?,  Brain imaging negative 7.  Diarrhea and fatigue while on capecitabine April 2023-capecitabine discontinued 01/18/2022    Disposition: Mr. RKothariappears stable.  5-FU pump was held last week due to diarrhea.  The diarrhea is better in general, stool more formed after taking Lomotil.  We discussed a treatment break versus resuming the 5-fluorouracil at a reduced dose.  He would like to resume the 5-fluorouracil at a reduced dose.  He understands the diarrhea may worsen and agrees to proceed.  He will contact the office with poorly controlled diarrhea.  CBC and chemistry panel reviewed.  Labs adequate to proceed as above.  We will see him in follow-up in 3 weeks, sooner if needed.  Plan reviewed with Dr. SBenay Spice    LNed CardANP/GNP-BC   03/25/2022  12:01 PM

## 2022-03-27 ENCOUNTER — Inpatient Hospital Stay: Payer: Medicare Other

## 2022-03-27 VITALS — BP 169/72 | HR 62 | Temp 98.2°F | Resp 20

## 2022-03-27 DIAGNOSIS — Z5111 Encounter for antineoplastic chemotherapy: Secondary | ICD-10-CM | POA: Diagnosis not present

## 2022-03-27 DIAGNOSIS — C20 Malignant neoplasm of rectum: Secondary | ICD-10-CM

## 2022-03-27 MED ORDER — HEPARIN SOD (PORK) LOCK FLUSH 100 UNIT/ML IV SOLN
500.0000 [IU] | Freq: Once | INTRAVENOUS | Status: AC | PRN
  Administered 2022-03-27: 500 [IU]

## 2022-03-27 MED ORDER — SODIUM CHLORIDE 0.9% FLUSH
10.0000 mL | INTRAVENOUS | Status: DC | PRN
  Administered 2022-03-27: 10 mL

## 2022-03-27 NOTE — Patient Instructions (Signed)

## 2022-04-08 ENCOUNTER — Ambulatory Visit: Payer: Medicare Other | Attending: Internal Medicine

## 2022-04-08 ENCOUNTER — Other Ambulatory Visit (HOSPITAL_BASED_OUTPATIENT_CLINIC_OR_DEPARTMENT_OTHER): Payer: Self-pay

## 2022-04-08 DIAGNOSIS — Z23 Encounter for immunization: Secondary | ICD-10-CM

## 2022-04-08 MED ORDER — PFIZER COVID-19 VAC BIVALENT 30 MCG/0.3ML IM SUSP
INTRAMUSCULAR | 0 refills | Status: DC
  Filled 2022-04-08: qty 0.3, 1d supply, fill #0

## 2022-04-08 NOTE — Progress Notes (Signed)
   Covid-19 Vaccination Clinic  Name:  Neil Perry    MRN: [id number] DOB: 05-10-1941  04/08/2022  [name] was observed post Covid-19 immunization for 15 minutes without incident. He was provided with Vaccine Information Sheet and instruction to access the V-Safe system.   [name] was instructed to call 911 with any severe reactions post vaccine: Difficulty breathing  Swelling of face and throat  A fast heartbeat  A bad rash all over body  Dizziness and weakness   Immunizations Administered     Name Date Dose VIS Date Route   Pfizer Covid-19 Vaccine Bivalent Booster 04/08/2022 10:30 AM 0.3 mL 05/23/2021 Intramuscular   Manufacturer: [place]   Lot: [id number]   [place]: [phone]

## 2022-04-09 ENCOUNTER — Inpatient Hospital Stay: Payer: Medicare Other

## 2022-04-09 ENCOUNTER — Inpatient Hospital Stay: Payer: Medicare Other | Admitting: Oncology

## 2022-04-11 ENCOUNTER — Inpatient Hospital Stay: Payer: Medicare Other

## 2022-04-14 ENCOUNTER — Other Ambulatory Visit: Payer: Self-pay | Admitting: Oncology

## 2022-04-15 ENCOUNTER — Other Ambulatory Visit: Payer: Self-pay

## 2022-04-16 ENCOUNTER — Inpatient Hospital Stay: Payer: Medicare Other

## 2022-04-16 ENCOUNTER — Encounter: Payer: Self-pay | Admitting: Nurse Practitioner

## 2022-04-16 ENCOUNTER — Inpatient Hospital Stay (HOSPITAL_BASED_OUTPATIENT_CLINIC_OR_DEPARTMENT_OTHER): Payer: Medicare Other | Admitting: Nurse Practitioner

## 2022-04-16 ENCOUNTER — Encounter: Payer: Self-pay | Admitting: *Deleted

## 2022-04-16 VITALS — BP 144/70 | HR 77 | Temp 98.2°F | Resp 18 | Ht 69.0 in | Wt 162.2 lb

## 2022-04-16 DIAGNOSIS — C20 Malignant neoplasm of rectum: Secondary | ICD-10-CM | POA: Diagnosis not present

## 2022-04-16 DIAGNOSIS — Z5111 Encounter for antineoplastic chemotherapy: Secondary | ICD-10-CM | POA: Diagnosis not present

## 2022-04-16 LAB — CBC WITH DIFFERENTIAL (CANCER CENTER ONLY)
Abs Immature Granulocytes: 0.01 10*3/uL (ref 0.00–0.07)
Basophils Absolute: 0 10*3/uL (ref 0.0–0.1)
Basophils Relative: 0 %
Eosinophils Absolute: 0.3 10*3/uL (ref 0.0–0.5)
Eosinophils Relative: 5 %
HCT: 38.5 % — ABNORMAL LOW (ref 39.0–52.0)
Hemoglobin: 13.3 g/dL (ref 13.0–17.0)
Immature Granulocytes: 0 %
Lymphocytes Relative: 12 %
Lymphs Abs: 0.7 10*3/uL (ref 0.7–4.0)
MCH: 28.9 pg (ref 26.0–34.0)
MCHC: 34.5 g/dL (ref 30.0–36.0)
MCV: 83.5 fL (ref 80.0–100.0)
Monocytes Absolute: 0.3 10*3/uL (ref 0.1–1.0)
Monocytes Relative: 6 %
Neutro Abs: 4.2 10*3/uL (ref 1.7–7.7)
Neutrophils Relative %: 77 %
Platelet Count: 245 10*3/uL (ref 150–400)
RBC: 4.61 MIL/uL (ref 4.22–5.81)
RDW: 13.8 % (ref 11.5–15.5)
WBC Count: 5.5 10*3/uL (ref 4.0–10.5)
nRBC: 0 % (ref 0.0–0.2)

## 2022-04-16 LAB — CMP (CANCER CENTER ONLY)
ALT: 9 U/L (ref 0–44)
AST: 10 U/L — ABNORMAL LOW (ref 15–41)
Albumin: 4 g/dL (ref 3.5–5.0)
Alkaline Phosphatase: 71 U/L (ref 38–126)
Anion gap: 12 (ref 5–15)
BUN: 19 mg/dL (ref 8–23)
CO2: 25 mmol/L (ref 22–32)
Calcium: 9.7 mg/dL (ref 8.9–10.3)
Chloride: 106 mmol/L (ref 98–111)
Creatinine: 1.02 mg/dL (ref 0.61–1.24)
GFR, Estimated: >60 mL/min (ref >60)
Glucose, Bld: 141 mg/dL — ABNORMAL HIGH (ref 70–99)
Potassium: 3.6 mmol/L (ref 3.5–5.1)
Sodium: 143 mmol/L (ref 135–145)
Total Bilirubin: 0.5 mg/dL (ref 0.3–1.2)
Total Protein: 6 g/dL — ABNORMAL LOW (ref 6.5–8.1)

## 2022-04-16 LAB — MAGNESIUM: Magnesium: 1.9 mg/dL (ref 1.7–2.4)

## 2022-04-16 LAB — CEA (ACCESS): CEA (CHCC): 26.29 ng/mL — ABNORMAL HIGH (ref 0.00–5.00)

## 2022-04-16 MED ORDER — SODIUM CHLORIDE 0.9 % IV SOLN
1600.0000 mg/m2 | INTRAVENOUS | Status: DC
  Administered 2022-04-16: 3050 mg via INTRAVENOUS
  Filled 2022-04-16: qty 61

## 2022-04-16 NOTE — Patient Instructions (Signed)
[facility]   Discharge Instructions: Thank you for choosing [facility] to provide your oncology and hematology care.   If you have a lab appointment with the [facility], please go directly to the [facility] and check in at the registration area.   Wear comfortable clothing and clothing appropriate for easy access to any Portacath or PICC line.   We strive to give you quality time with your provider. You may need to reschedule your appointment if you arrive late (15 or more minutes).  Arriving late affects you and other patients whose appointments are after yours.  Also, if you miss three or more appointments without notifying the office, you may be dismissed from the clinic at the provider's discretion.      For prescription refill requests, have your pharmacy contact our office and allow 72 hours for refills to be completed.    Today you received the following chemotherapy and/or immunotherapy agents Flourouracil (ADRUCIL).      To help prevent nausea and vomiting after your treatment, we encourage you to take your nausea medication as directed.  BELOW ARE SYMPTOMS THAT SHOULD BE REPORTED IMMEDIATELY: *FEVER GREATER THAN 100.4 F (38 C) OR HIGHER *CHILLS OR SWEATING *NAUSEA AND VOMITING THAT IS NOT CONTROLLED WITH YOUR NAUSEA MEDICATION *UNUSUAL SHORTNESS OF BREATH *UNUSUAL BRUISING OR BLEEDING *URINARY PROBLEMS (pain or burning when urinating, or frequent urination) *BOWEL PROBLEMS (unusual diarrhea, constipation, pain near the anus) TENDERNESS IN MOUTH AND THROAT WITH OR WITHOUT PRESENCE OF ULCERS (sore throat, sores in mouth, or a toothache) UNUSUAL RASH, SWELLING OR PAIN  UNUSUAL VAGINAL DISCHARGE OR ITCHING   Items with * indicate a potential emergency and should be followed up as soon as possible or go to the Emergency Department if any problems should occur.  Please show the CHEMOTHERAPY ALERT CARD or IMMUNOTHERAPY ALERT CARD at  check-in to the Emergency Department and triage nurse.  Should you have questions after your visit or need to cancel or reschedule your appointment, please contact [facility]  Dept: [phone]  and follow the prompts.  Office hours are 8:00 a.m. to 4:30 p.m. Monday - Friday. Please note that voicemails left after 4:00 p.m. may not be returned until the following business day.  We are closed weekends and major holidays. You have access to a nurse at all times for urgent questions. Please call the main number to the clinic Dept: [phone] and follow the prompts.   For any non-urgent questions, you may also contact your provider using MyChart. We now offer e-Visits for anyone 46 and older to request care online for non-urgent symptoms. For details visit [email or web address].   Also download the MyChart app! Go to the app store, search "MyChart", open the app, select [facility], and log in with your MyChart username and password.  Masks are optional in the cancer centers. If you would like for your care team to wear a mask while they are taking care of you, please let them know. For doctor visits, patients may have with them one support person who is at least 82 years old. At this time, visitors are not allowed in the infusion area.  Fluorouracil, 5-FU injection What is this medication? FLUOROURACIL, 5-FU (flure oh YOOR a sil) is a chemotherapy drug. It slows the growth of cancer cells. This medicine is used to treat many types of cancer like breast cancer, colon or rectal cancer, pancreatic cancer, and stomach cancer. This  medicine may be used for other purposes; ask your health care provider or pharmacist if you have questions. COMMON BRAND NAME(S): Adrucil What should I tell my care team before I take this medication? They need to know if you have any of these conditions: blood disorders dihydropyrimidine dehydrogenase (DPD) deficiency infection (especially a  virus infection such as chickenpox, cold sores, or herpes) kidney disease liver disease malnourished, poor nutrition recent or ongoing radiation therapy an unusual or allergic reaction to fluorouracil, other chemotherapy, other medicines, foods, dyes, or preservatives pregnant or trying to get pregnant breast-feeding How should I use this medication? This drug is given as an infusion or injection into a vein. It is administered in a hospital or clinic by a specially trained health care professional. Talk to your pediatrician regarding the use of this medicine in children. Special care may be needed. Overdosage: If you think you have taken too much of this medicine contact a poison control center or emergency room at once. NOTE: This medicine is only for you. Do not share this medicine with others. What if I miss a dose? It is important not to miss your dose. Call your doctor or health care professional if you are unable to keep an appointment. What may interact with this medication? Do not take this medicine with any of the following medications: live virus vaccines This medicine may also interact with the following medications: medicines that treat or prevent blood clots like warfarin, enoxaparin, and dalteparin This list may not describe all possible interactions. Give your health care provider a list of all the medicines, herbs, non-prescription drugs, or dietary supplements you use. Also tell them if you smoke, drink alcohol, or use illegal drugs. Some items may interact with your medicine. What should I watch for while using this medication? Visit your doctor for checks on your progress. This drug may make you feel generally unwell. This is not uncommon, as chemotherapy can affect healthy cells as well as cancer cells. Report any side effects. Continue your course of treatment even though you feel ill unless your doctor tells you to stop. In some cases, you may be given additional  medicines to help with side effects. Follow all directions for their use. Call your doctor or health care professional for advice if you get a fever, chills or sore throat, or other symptoms of a cold or flu. Do not treat yourself. This drug decreases your body's ability to fight infections. Try to avoid being around people who are sick. This medicine may increase your risk to bruise or bleed. Call your doctor or health care professional if you notice any unusual bleeding. Be careful brushing and flossing your teeth or using a toothpick because you may get an infection or bleed more easily. If you have any dental work done, tell your dentist you are receiving this medicine. Avoid taking products that contain aspirin, acetaminophen, ibuprofen, naproxen, or ketoprofen unless instructed by your doctor. These medicines may hide a fever. Do not become pregnant while taking this medicine. Women should inform their doctor if they wish to become pregnant or think they might be pregnant. There is a potential for serious side effects to an unborn child. Talk to your health care professional or pharmacist for more information. Do not breast-feed an infant while taking this medicine. Men should inform their doctor if they wish to father a child. This medicine may lower sperm counts. Do not treat diarrhea with over the counter products. Contact your doctor if you  have diarrhea that lasts more than 2 days or if it is severe and watery. This medicine can make you more sensitive to the sun. Keep out of the sun. If you cannot avoid being in the sun, wear protective clothing and use sunscreen. Do not use sun lamps or tanning beds/booths. What side effects may I notice from receiving this medication? Side effects that you should report to your doctor or health care professional as soon as possible: allergic reactions like skin rash, itching or hives, swelling of the face, lips, or tongue low blood counts - this medicine  may decrease the number of white blood cells, red blood cells and platelets. You may be at increased risk for infections and bleeding. signs of infection - fever or chills, cough, sore throat, pain or difficulty passing urine signs of decreased platelets or bleeding - bruising, pinpoint red spots on the skin, black, tarry stools, blood in the urine signs of decreased red blood cells - unusually weak or tired, fainting spells, lightheadedness breathing problems changes in vision chest pain mouth sores nausea and vomiting pain, swelling, redness at site where injected pain, tingling, numbness in the hands or feet redness, swelling, or sores on hands or feet stomach pain unusual bleeding Side effects that usually do not require medical attention (report to your doctor or health care professional if they continue or are bothersome): changes in finger or toe nails diarrhea dry or itchy skin hair loss headache loss of appetite sensitivity of eyes to the light stomach upset unusually teary eyes This list may not describe all possible side effects. Call your doctor for medical advice about side effects. You may report side effects to FDA at 1-800-FDA-1088. Where should I keep my medication? This drug is given in a hospital or clinic and will not be stored at home. NOTE: This sheet is a summary. It may not cover all possible information. If you have questions about this medicine, talk to your doctor, pharmacist, or health care provider.  2023 Elsevier/Gold Standard (2021-08-10 00:00:00) The chemotherapy medication bag should finish at 46 hours, 96 hours, or 7 days. For example, if your pump is scheduled for 46 hours and it was put on at 4:00 p.m., it should finish at 2:00 p.m. the day it is scheduled to come off regardless of your appointment time.     Estimated time to finish at 08:30 a.m. on Thursday 04/18/2022.   If the display on your pump reads "Low Volume" and it is beeping, take the  batteries out of the pump and come to the cancer center for it to be taken off.   If the pump alarms go off prior to the pump reading "Low Volume" then call [phone] and someone can assist you.  If the plunger comes out and the chemotherapy medication is leaking out, please use your home chemo spill kit to clean up the spill. Do NOT use paper towels or other household products.  If you have problems or questions regarding your pump, please call either [phone] (24 hours a day) or the cancer center Monday-Friday 8:00 a.m.- 4:30 p.m. at the clinic number and we will assist you. If you are unable to get assistance, then go to the nearest Emergency Department and ask the staff to contact the IV team for assistance.

## 2022-04-16 NOTE — Progress Notes (Signed)
[facility] OFFICE PROGRESS NOTE   Diagnosis: Rectal cancer  INTERVAL HISTORY:   Neil Perry returns as scheduled.  He completed another cycle of 5-FU 03/25/2022.  Dose was reduced due to diarrhea with the previous cycle.  He denies significant diarrhea.  He takes 1 Imodium or Lomotil tablet a day.  He had an episode of cramping abdominal pain 1 day last week.  No nausea/vomiting.  No mouth sores.  No hand or foot pain or redness.  He reports feeling well.  Objective:  Vital signs in last 24 hours:  Blood pressure (!) 144/70, pulse 77, temperature 98.2 F (36.8 C), resp. rate 18, height 5' 9"  (1.753 m), weight 162 lb 3.2 oz (73.6 kg), SpO2 100 %.    HEENT: No thrush or ulcers. Resp: Lungs clear bilaterally. Cardio: Regular rate and rhythm. GI: Abdomen soft and nontender.  No hepatosplenomegaly. Vascular: No leg edema. Skin: Palms without erythema. Port-A-Cath without erythema.   Lab Results:  Lab Results  Component Value Date   WBC 5.5 04/16/2022   HGB 13.3 04/16/2022   HCT 38.5 (L) 04/16/2022   MCV 83.5 04/16/2022   PLT 245 04/16/2022   NEUTROABS 4.2 04/16/2022    Imaging:  No results found.  Medications: I have reviewed the patient's current medications.  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; foundation 1-microsatellite stable, tumor mutation burden 5, K-rasG 12V, NRAS wildtype Colonoscopy 05/22/2021-ulcerated partially obstructing mass at 15 cm from anal verge CTs 05/30/2021-numerous small pulmonary nodules concerning for metastases, thickening of the rectum, multiple liver metastases Cycle 1 FOLFOX 06/12/2021 Cycle 2 FOLFOX  06/26/2021 Cycle 3 FOLFOX 07/10/2021, oxaliplatin dose reduced due to progressive decline in the [place] and platelet count Cycle 4 FOLFOX 07/24/2021 Cycle 5 FOLFOX 08/07/2021, 5-FU bolus eliminated and oxaliplatin dose reduced, insurance would not approve Udenyca CTs 08/15/2021-decrease size of lung nodules, decreased hepatic metastases, persistent anorectal wall thickening, stable right renal mass Cycle 6 FOLFOX 08/21/2021, 5-FU bolus and 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 Cycle 7 FOLFOX 09/04/2021, 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 Cycle 8 FOLFOX 09/18/2021, 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 Cycle 9 FOLFOX 10/08/2021, 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 Cycle 10 FOLFOX 10/23/2021, 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 CTs 11/01/2021-decrease in size of occasional small pulmonary nodules, continued decrease in size of multiple hypoenhancing hepatic metastases, unchanged posttreatment appearance of the low rectum, unchanged exophytic mass of the inferior pole of the right kidney measuring 1.5 x 1.3 cm Cycle 11 5-fluorouracil 11/06/2021 Cycle 12 5-fluorouracil 11/20/2021 Cycle 13 5-fluorouracil 12/04/2021 Maintenance Xeloda beginning 12/19/2021 Xeloda dose reduced to 1000 mg twice daily beginning 12/27/2021 Xeloda placed on hold 01/16/2022 CTs 01/22/2022-segment of distal ileum with circumferential wall thickening and perienteric inflammation, decrease in size of liver metastases, stable tiny bilateral pulmonary nodules, circumferential wall thickening in the rectum, stable lower pole right kidney lesion suspicious for renal cell carcinoma Xeloda discontinued due to diarrhea 5-fluorouracil pump 02/05/2022 5-fluorouracil pump 02/26/2022 Treatment held 03/18/2022 due to diarrhea 5-fluorouracil pump 03/25/2022, dose reduced due to diarrhea 5-fluorouracil pump 04/16/2022 Prostate cancer-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  Mitral valve prolapse 5.  Family history of prostate cancer 6.  08/15/2021 with leg weakness and an episode of slurred speech-TIA?,  Brain imaging negative 7.  Diarrhea and fatigue while on capecitabine April 2023-capecitabine discontinued 01/18/2022    Disposition: Mr. ROlanoappears stable.  He is on active treatment with 5-fluorouracil on a 3-week schedule.  He had no significant diarrhea following the most recent treatment.  Plan to proceed with 5-fluorouracil today as scheduled.  We discussed the mildly elevated CEA.  We will follow-up on the value from today.  CBC and chemistry panel reviewed.  Labs adequate to proceed with treatment as above.  He will return for lab, follow-up, 5-fluorouracil in 3 weeks.    LNed CardANP/GNP-BC   04/16/2022  9:28 AM

## 2022-04-16 NOTE — Progress Notes (Signed)
Patient seen by Lisa Thomas NP today  Vitals are within treatment parameters.  Labs reviewed by Lisa Thomas NP and are within treatment parameters.  Per physician team, patient is ready for treatment and there are NO modifications to the treatment plan.     

## 2022-04-18 ENCOUNTER — Inpatient Hospital Stay: Payer: Medicare Other

## 2022-04-18 VITALS — BP 147/77 | HR 63 | Temp 98.6°F | Resp 18

## 2022-04-18 DIAGNOSIS — C20 Malignant neoplasm of rectum: Secondary | ICD-10-CM

## 2022-04-18 DIAGNOSIS — Z5111 Encounter for antineoplastic chemotherapy: Secondary | ICD-10-CM | POA: Diagnosis not present

## 2022-04-18 MED ORDER — HEPARIN SOD (PORK) LOCK FLUSH 100 UNIT/ML IV SOLN
500.0000 [IU] | Freq: Once | INTRAVENOUS | Status: AC | PRN
  Administered 2022-04-18: 500 [IU]

## 2022-04-18 MED ORDER — SODIUM CHLORIDE 0.9% FLUSH
10.0000 mL | INTRAVENOUS | Status: DC | PRN
  Administered 2022-04-18: 10 mL

## 2022-04-18 NOTE — Patient Instructions (Signed)

## 2022-04-19 ENCOUNTER — Other Ambulatory Visit: Payer: Self-pay | Admitting: Nurse Practitioner

## 2022-04-19 ENCOUNTER — Telehealth: Payer: Self-pay

## 2022-04-19 DIAGNOSIS — C20 Malignant neoplasm of rectum: Secondary | ICD-10-CM

## 2022-04-19 NOTE — Telephone Encounter (Signed)
Patient is schedule for his Ct scan at [facility] on 11th at 1230, and patient gave verbal understanding and had no further questions or concerns. Patient knows to come by to pick up his contrast.

## 2022-04-19 NOTE — Telephone Encounter (Signed)
-----   Message from Owens Shark, NP sent at 04/19/2022 10:04 AM EDT ----- Please let him know the CEA earlier this week was higher.  Dr. [name] recommends CT scans before next office visit.

## 2022-04-30 ENCOUNTER — Ambulatory Visit: Payer: Medicare Other

## 2022-04-30 ENCOUNTER — Other Ambulatory Visit: Payer: Medicare Other

## 2022-04-30 ENCOUNTER — Ambulatory Visit: Payer: Medicare Other | Admitting: Oncology

## 2022-05-03 ENCOUNTER — Encounter (HOSPITAL_COMMUNITY): Payer: Self-pay

## 2022-05-03 ENCOUNTER — Ambulatory Visit (HOSPITAL_COMMUNITY)
Admission: RE | Admit: 2022-05-03 | Discharge: 2022-05-03 | Disposition: A | Discharge status: Home / Self Care | Payer: Medicare Other | Source: Ambulatory Visit | Attending: Nurse Practitioner | Admitting: Nurse Practitioner

## 2022-05-03 DIAGNOSIS — C20 Malignant neoplasm of rectum: Secondary | ICD-10-CM | POA: Diagnosis present

## 2022-05-03 MED ORDER — SODIUM CHLORIDE (PF) 0.9 % IJ SOLN
INTRAMUSCULAR | Status: AC
  Filled 2022-05-03: qty 50

## 2022-05-03 MED ORDER — IOHEXOL 300 MG/ML  SOLN
100.0000 mL | Freq: Once | INTRAMUSCULAR | Status: AC | PRN
  Administered 2022-05-03: 100 mL via INTRAVENOUS

## 2022-05-05 ENCOUNTER — Other Ambulatory Visit: Payer: Self-pay | Admitting: Oncology

## 2022-05-05 DIAGNOSIS — C20 Malignant neoplasm of rectum: Secondary | ICD-10-CM

## 2022-05-05 NOTE — Progress Notes (Signed)
OFF PATHWAY REGIMEN - Colorectal  No Change  Continue With Treatment as Ordered.  Original Decision Date/Time: 01/29/2022 12:10   [id number] (Leucovorin IV D1 + Fluorouracil IV D1/CIV D1,2 + Oxaliplatin IV D1) q14 Days:   A cycle is every 14 days:     Oxaliplatin      Leucovorin      Fluorouracil      Fluorouracil   **Always confirm dose/schedule in your pharmacy ordering system**  Patient Characteristics: Distant Metastases, Nonsurgical Candidate, KRAS/NRAS Mutation Positive/Unknown (BRAF V600 Wild-Type/Unknown), Standard Cytotoxic Therapy, First Line Standard Cytotoxic Therapy, Bevacizumab Ineligible, PS = 0,1 Tumor Location: Rectal Therapeutic Status: Distant Metastases Microsatellite/Mismatch Repair Status: Unknown BRAF Mutation Status: Awaiting Test Results KRAS/NRAS Mutation Status: Awaiting Test Results Standard Cytotoxic Line of Therapy: First Line Standard Cytotoxic Therapy ECOG Performance Status: 0 Bevacizumab Eligibility: Ineligible Intent of Therapy: Non-Curative / Palliative Intent, Discussed with Patient

## 2022-05-07 ENCOUNTER — Inpatient Hospital Stay: Payer: Medicare Other | Attending: Nurse Practitioner

## 2022-05-07 ENCOUNTER — Inpatient Hospital Stay: Payer: Medicare Other

## 2022-05-07 ENCOUNTER — Inpatient Hospital Stay (HOSPITAL_BASED_OUTPATIENT_CLINIC_OR_DEPARTMENT_OTHER): Payer: Medicare Other | Admitting: Oncology

## 2022-05-07 ENCOUNTER — Encounter: Payer: Self-pay | Admitting: *Deleted

## 2022-05-07 VITALS — BP 142/74 | HR 82 | Temp 98.2°F | Resp 20 | Ht 69.0 in | Wt 162.4 lb

## 2022-05-07 DIAGNOSIS — C20 Malignant neoplasm of rectum: Secondary | ICD-10-CM | POA: Diagnosis not present

## 2022-05-07 DIAGNOSIS — Z85528 Personal history of other malignant neoplasm of kidney: Secondary | ICD-10-CM | POA: Diagnosis not present

## 2022-05-07 DIAGNOSIS — Z5111 Encounter for antineoplastic chemotherapy: Secondary | ICD-10-CM | POA: Insufficient documentation

## 2022-05-07 DIAGNOSIS — C787 Secondary malignant neoplasm of liver and intrahepatic bile duct: Secondary | ICD-10-CM | POA: Insufficient documentation

## 2022-05-07 DIAGNOSIS — R918 Other nonspecific abnormal finding of lung field: Secondary | ICD-10-CM | POA: Insufficient documentation

## 2022-05-07 LAB — CMP (CANCER CENTER ONLY)
ALT: 8 U/L (ref 0–44)
AST: 11 U/L — ABNORMAL LOW (ref 15–41)
Albumin: 4 g/dL (ref 3.5–5.0)
Alkaline Phosphatase: 78 U/L (ref 38–126)
Anion gap: 9 (ref 5–15)
BUN: 21 mg/dL (ref 8–23)
CO2: 25 mmol/L (ref 22–32)
Calcium: 9.2 mg/dL (ref 8.9–10.3)
Chloride: 107 mmol/L (ref 98–111)
Creatinine: 1.08 mg/dL (ref 0.61–1.24)
GFR, Estimated: >60 mL/min (ref >60)
Glucose, Bld: 139 mg/dL — ABNORMAL HIGH (ref 70–99)
Potassium: 3.9 mmol/L (ref 3.5–5.1)
Sodium: 141 mmol/L (ref 135–145)
Total Bilirubin: 0.6 mg/dL (ref 0.3–1.2)
Total Protein: 6.1 g/dL — ABNORMAL LOW (ref 6.5–8.1)

## 2022-05-07 LAB — CBC WITH DIFFERENTIAL (CANCER CENTER ONLY)
Abs Immature Granulocytes: 0.01 10*3/uL (ref 0.00–0.07)
Basophils Absolute: 0 10*3/uL (ref 0.0–0.1)
Basophils Relative: 0 %
Eosinophils Absolute: 0.2 10*3/uL (ref 0.0–0.5)
Eosinophils Relative: 3 %
HCT: 39.5 % (ref 39.0–52.0)
Hemoglobin: 13.6 g/dL (ref 13.0–17.0)
Immature Granulocytes: 0 %
Lymphocytes Relative: 12 %
Lymphs Abs: 0.7 10*3/uL (ref 0.7–4.0)
MCH: 28.3 pg (ref 26.0–34.0)
MCHC: 34.4 g/dL (ref 30.0–36.0)
MCV: 82.3 fL (ref 80.0–100.0)
Monocytes Absolute: 0.4 10*3/uL (ref 0.1–1.0)
Monocytes Relative: 8 %
Neutro Abs: 4.2 10*3/uL (ref 1.7–7.7)
Neutrophils Relative %: 77 %
Platelet Count: 250 10*3/uL (ref 150–400)
RBC: 4.8 MIL/uL (ref 4.22–5.81)
RDW: 14.2 % (ref 11.5–15.5)
WBC Count: 5.5 10*3/uL (ref 4.0–10.5)
nRBC: 0 % (ref 0.0–0.2)

## 2022-05-07 LAB — CEA (ACCESS): CEA (CHCC): 47.03 ng/mL — ABNORMAL HIGH (ref 0.00–5.00)

## 2022-05-07 MED ORDER — SODIUM CHLORIDE 0.9 % IV SOLN
1600.0000 mg/m2 | INTRAVENOUS | Status: DC
  Administered 2022-05-07: 3000 mg via INTRAVENOUS
  Filled 2022-05-07: qty 60

## 2022-05-07 MED ORDER — SODIUM CHLORIDE 0.9% FLUSH
10.0000 mL | INTRAVENOUS | Status: DC | PRN
  Administered 2022-05-07: 10 mL

## 2022-05-07 NOTE — Progress Notes (Signed)
Patient seen by Dr. [name] today  Vitals are within treatment parameters. MD aware of SBP 142--no new intervention indicated.  Labs reviewed by Dr. [name] and are within treatment parameters. MD is aware of increase in CEA-proceed. Per physician team, patient is ready for treatment and there are NO modifications to the treatment plan.

## 2022-05-07 NOTE — Progress Notes (Signed)
[facility] OFFICE PROGRESS NOTE   Diagnosis: Rectal cancer  INTERVAL HISTORY:   Neil Perry complete another cycle of 5-FU on 04/16/2022.  He reports occasional diarrhea.  He alternates Colace and Imodium.  His not have significant neuropathy symptoms.  He had 1 episode of abdominal cramping and diarrhea yesterday.  He feels well. He continues Xtandi.  He has noted increased hot flashes with the warmer weather. Objective:  Vital signs in last 24 hours:  Blood pressure (!) 142/74, pulse 82, temperature 98.2 F (36.8 C), temperature source Oral, resp. rate 20, height 5' 9"  (1.753 m), weight 162 lb 6.4 oz (73.7 kg), SpO2 100 %.    HEENT: No thrush or ulcers Resp: Lungs clear bilaterally Cardio: Regular rate and rhythm GI: No hepatosplenomegaly, nontender Vascular: No leg edema    Portacath/PICC-without erythema  Lab Results:  Lab Results  Component Value Date   WBC 5.5 05/07/2022   HGB 13.6 05/07/2022   HCT 39.5 05/07/2022   MCV 82.3 05/07/2022   PLT 250 05/07/2022   NEUTROABS 4.2 05/07/2022    CMP  Lab Results  Component Value Date   NA 141 05/07/2022   K 3.9 05/07/2022   CL 107 05/07/2022   CO2 25 05/07/2022   GLUCOSE 139 (H) 05/07/2022   BUN 21 05/07/2022   CREATININE 1.08 05/07/2022   CALCIUM 9.2 05/07/2022   PROT 6.1 (L) 05/07/2022   ALBUMIN 4.0 05/07/2022   AST 11 (L) 05/07/2022   ALT 8 05/07/2022   ALKPHOS 78 05/07/2022   BILITOT 0.6 05/07/2022   GFRNONAA >60 05/07/2022    Lab Results  Component Value Date   CEA 47.03 (H) 05/07/2022     CT CHEST ABDOMEN PELVIS W CONTRAST  Result Date: 05/05/2022 CLINICAL DATA:  Metastatic rectal cancer, status post XRT, ongoing chemotherapy, rising CEA, assess treatment response * Tracking Code: BO * EXAM: CT CHEST, ABDOMEN, AND PELVIS WITH CONTRAST TECHNIQUE: Multidetector CT imaging of the chest, abdomen and pelvis was performed following the standard protocol during bolus administration of  intravenous contrast. RADIATION DOSE REDUCTION: This exam was performed according to the departmental dose-optimization program which includes automated exposure control, adjustment of the mA and/or kV according to patient size and/or use of iterative reconstruction technique. CONTRAST:  16m OMNIPAQUE IOHEXOL 300 MG/ML SOLN additional oral enteric contrast COMPARISON:  01/22/2022 FINDINGS: CT CHEST FINDINGS Cardiovascular: Right chest port catheter. Aortic atherosclerosis. Normal heart size. Left and right coronary artery calcifications. No pericardial effusion. Mediastinum/Nodes: No enlarged mediastinal, hilar, or axillary lymph nodes. Thyroid gland, trachea, and esophagus demonstrate no significant findings. Lungs/Pleura: Unchanged very tiny pulmonary nodules, measuring no greater than 0.2 cm (series 6, image 72, 111). Dependent bibasilar scarring and or atelectasis. No pleural effusion or pneumothorax. Musculoskeletal: No chest wall abnormality. No acute osseous findings. CT ABDOMEN PELVIS FINDINGS Hepatobiliary: No significant change in multiple hypodense liver lesions, some of which reflect incidental benign cysts, others metastases. Index lesion in the high central liver dome, hepatic segment VII/VIII is unchanged measuring 1.9 x 1.5 cm (series 2, image 50). Additional index lesion in the central right lobe of the liver, hepatic segment V/VIII is unchanged, measuring 2.3 x 2.0 cm (series 2, image 55). No gallstones, gallbladder wall thickening, or biliary dilatation. Pancreas: Unremarkable. No pancreatic ductal dilatation or surrounding inflammatory changes. Spleen: Normal in size without significant abnormality. Adrenals/Urinary Tract: Stable, benign small left adrenal adenoma (series 2, image 62). Unchanged heterogeneously enhancing mass of the posterior inferior pole of the right kidney  measuring 1.5 x 1.3 cm (series 2, image 76). The left kidney is normal. No hydronephrosis. Unchanged, severe urinary  bladder wall thickening. Stomach/Bowel: Stomach is within normal limits. Appendix appears normal. Large burden of stool throughout the colon. Similar circumferential wall thickening of the low rectum with adjacent perinephric fat stranding and presacral soft tissue thickening (series 2, image 120). Vascular/Lymphatic: Aortic atherosclerosis. No enlarged abdominal or pelvic lymph nodes. Reproductive: Biopsy marking clips in the vicinity of the prostate. Other: No abdominal wall hernia or abnormality. No ascites. Musculoskeletal: No acute osseous findings. IMPRESSION: 1. Unchanged very tiny pulmonary nodules, consistent with treated metastases. No new nodules. 2. Unchanged hypodense liver metastases. 3. Similar post treatment appearance of circumferential wall thickening of the low rectum with adjacent perinephric fat stranding and presacral soft tissue thickening. 4. Unchanged heterogeneously enhancing mass of the posterior inferior pole of the right kidney measuring 1.5 x 1.3 cm, consistent with a small renal cell carcinoma. 5. Unchanged, severe urinary bladder wall thickening, likely due to a combination of local radiation therapy and chronic outlet obstruction. 6. Coronary artery disease. Aortic Atherosclerosis (ICD10-I70.0). Electronically Signed   By: Delanna Ahmadi M.D.   On: 05/05/2022 11:26    Medications: I have reviewed the patient's current medications.   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; foundation 1-microsatellite stable, tumor mutation burden 5, K-rasG 12V, NRAS wildtype Colonoscopy 05/22/2021-ulcerated partially obstructing mass at 15 cm from anal verge CTs  05/30/2021-numerous small pulmonary nodules concerning for metastases, thickening of the rectum, multiple liver metastases Cycle 1 FOLFOX 06/12/2021 Cycle 2 FOLFOX 06/26/2021 Cycle 3 FOLFOX 07/10/2021, oxaliplatin dose reduced due to progressive decline in the Alexander and platelet count Cycle 4 FOLFOX 07/24/2021 Cycle 5 FOLFOX 08/07/2021, 5-FU bolus eliminated and oxaliplatin dose reduced, insurance would not approve Udenyca CTs 08/15/2021-decrease size of lung nodules, decreased hepatic metastases, persistent anorectal wall thickening, stable right renal mass Cycle 6 FOLFOX 08/21/2021, 5-FU bolus and 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 Cycle 7 FOLFOX 09/04/2021, 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 Cycle 8 FOLFOX 09/18/2021, 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 Cycle 9 FOLFOX 10/08/2021, 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 Cycle 10 FOLFOX 10/23/2021, 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 CTs 11/01/2021-decrease in size of occasional small pulmonary nodules, continued decrease in size of multiple hypoenhancing hepatic metastases, unchanged posttreatment appearance of the low rectum, unchanged exophytic mass of the inferior pole of the right kidney measuring 1.5 x 1.3 cm Cycle 11 5-fluorouracil 11/06/2021 Cycle 12 5-fluorouracil 11/20/2021 Cycle 13 5-fluorouracil 12/04/2021 Maintenance Xeloda beginning 12/19/2021 Xeloda dose reduced to 1000 mg twice daily beginning 12/27/2021 Xeloda placed on hold 01/16/2022 CTs 01/22/2022-segment of distal ileum with circumferential wall thickening and perienteric inflammation, decrease in size of liver metastases, stable tiny bilateral pulmonary nodules, circumferential wall thickening in the rectum, stable lower pole right kidney lesion suspicious for renal cell carcinoma Xeloda discontinued due to diarrhea 5-fluorouracil pump 02/05/2022 5-fluorouracil pump 02/26/2022 Treatment held 03/18/2022 due to  diarrhea 5-fluorouracil pump 03/25/2022, dose reduced due to diarrhea 5-fluorouracil pump 04/16/2022 CTs 05/03/2022-unchanged tiny pulmonary nodules, unchanged liver metastases, stable appearance of the low rectal wall thickening, unchanged posterior right renal mass 5-fluorouracil pump 05/07/2022 Prostate cancer-simple prostatectomy January 2019, Gleason 3+3, 10 to 12% of specimen Active surveillance, biopsy May 2019 with  small focus of Gleason 3+3 disease in 1/6 cores, surveillance continued Elevated PSA 04/20/2020 Biopsy 06/19/2020 8/12 cores positive, Gleason 4+4 PET scan 07/07/2020-negative CT 07/07/2020 right iliac and retrocaval adenopathy, 1.3 cm subcapsular right lower pole renal lesion Androgen deprivation therapy beginning 08/21/2020 Radiation to prostate, seminal vesicles, and pelvic lymph nodes to 10/22 - 12/28/2020 He continues every 63-monthFirmagon and daily Xtandi   3.  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  Mitral valve prolapse 5.  Family history of prostate cancer 6.  08/15/2021 with leg weakness and an episode of slurred speech-TIA?,  Brain imaging negative 7.  Diarrhea and fatigue while on capecitabine April 2023-capecitabine discontinued 01/18/2022     Disposition: [place]. [place].  The CEA is rising, but the restaging CTs reveal no evidence of disease progression.  I reviewed the CT images.  We decided to continue 5-fluorouracil with the plan for a restaging CT within 2-3 months.  We will consider changing back to oxaliplatin based therapy versus irinotecan when there is radiologic or clinical evidence of disease progression.  The CEA remains significantly lower compared to when he began FOLFOX last year.  GBetsy Coder MD  05/07/2022  11:28 AM

## 2022-05-07 NOTE — Patient Instructions (Signed)

## 2022-05-07 NOTE — Patient Instructions (Signed)
[facility]   Discharge Instructions: Thank you for choosing [facility] to provide your oncology and hematology care.   If you have a lab appointment with the [facility], please go directly to the [facility] and check in at the registration area.   Wear comfortable clothing and clothing appropriate for easy access to any Portacath or PICC line.   We strive to give you quality time with your provider. You may need to reschedule your appointment if you arrive late (15 or more minutes).  Arriving late affects you and other patients whose appointments are after yours.  Also, if you miss three or more appointments without notifying the office, you may be dismissed from the clinic at the provider's discretion.      For prescription refill requests, have your pharmacy contact our office and allow 72 hours for refills to be completed.    Today you received the following chemotherapy and/or immunotherapy agents Flourouracil (ADRUCIL).      To help prevent nausea and vomiting after your treatment, we encourage you to take your nausea medication as directed.  BELOW ARE SYMPTOMS THAT SHOULD BE REPORTED IMMEDIATELY: *FEVER GREATER THAN 100.4 F (38 C) OR HIGHER *CHILLS OR SWEATING *NAUSEA AND VOMITING THAT IS NOT CONTROLLED WITH YOUR NAUSEA MEDICATION *UNUSUAL SHORTNESS OF BREATH *UNUSUAL BRUISING OR BLEEDING *URINARY PROBLEMS (pain or burning when urinating, or frequent urination) *BOWEL PROBLEMS (unusual diarrhea, constipation, pain near the anus) TENDERNESS IN MOUTH AND THROAT WITH OR WITHOUT PRESENCE OF ULCERS (sore throat, sores in mouth, or a toothache) UNUSUAL RASH, SWELLING OR PAIN  UNUSUAL VAGINAL DISCHARGE OR ITCHING   Items with * indicate a potential emergency and should be followed up as soon as possible or go to the Emergency Department if any problems should occur.  Please show the CHEMOTHERAPY ALERT CARD or IMMUNOTHERAPY ALERT CARD at  check-in to the Emergency Department and triage nurse.  Should you have questions after your visit or need to cancel or reschedule your appointment, please contact [facility]  Dept: [phone]  and follow the prompts.  Office hours are 8:00 a.m. to 4:30 p.m. Monday - Friday. Please note that voicemails left after 4:00 p.m. may not be returned until the following business day.  We are closed weekends and major holidays. You have access to a nurse at all times for urgent questions. Please call the main number to the clinic Dept: [phone] and follow the prompts.   For any non-urgent questions, you may also contact your provider using MyChart. We now offer e-Visits for anyone 25 and older to request care online for non-urgent symptoms. For details visit [email or web address].   Also download the MyChart app! Go to the app store, search "MyChart", open the app, select [facility], and log in with your MyChart username and password.  Masks are optional in the cancer centers. If you would like for your care team to wear a mask while they are taking care of you, please let them know. For doctor visits, patients may have with them one support person who is at least 82 years old. At this time, visitors are not allowed in the infusion area.  Fluorouracil, 5-FU injection What is this medication? FLUOROURACIL, 5-FU (flure oh YOOR a sil) is a chemotherapy drug. It slows the growth of cancer cells. This medicine is used to treat many types of cancer like breast cancer, colon or rectal cancer, pancreatic cancer, and stomach cancer. This  medicine may be used for other purposes; ask your health care provider or pharmacist if you have questions. COMMON BRAND NAME(S): Adrucil What should I tell my care team before I take this medication? They need to know if you have any of these conditions: blood disorders dihydropyrimidine dehydrogenase (DPD) deficiency infection (especially a  virus infection such as chickenpox, cold sores, or herpes) kidney disease liver disease malnourished, poor nutrition recent or ongoing radiation therapy an unusual or allergic reaction to fluorouracil, other chemotherapy, other medicines, foods, dyes, or preservatives pregnant or trying to get pregnant breast-feeding How should I use this medication? This drug is given as an infusion or injection into a vein. It is administered in a hospital or clinic by a specially trained health care professional. Talk to your pediatrician regarding the use of this medicine in children. Special care may be needed. Overdosage: If you think you have taken too much of this medicine contact a poison control center or emergency room at once. NOTE: This medicine is only for you. Do not share this medicine with others. What if I miss a dose? It is important not to miss your dose. Call your doctor or health care professional if you are unable to keep an appointment. What may interact with this medication? Do not take this medicine with any of the following medications: live virus vaccines This medicine may also interact with the following medications: medicines that treat or prevent blood clots like warfarin, enoxaparin, and dalteparin This list may not describe all possible interactions. Give your health care provider a list of all the medicines, herbs, non-prescription drugs, or dietary supplements you use. Also tell them if you smoke, drink alcohol, or use illegal drugs. Some items may interact with your medicine. What should I watch for while using this medication? Visit your doctor for checks on your progress. This drug may make you feel generally unwell. This is not uncommon, as chemotherapy can affect healthy cells as well as cancer cells. Report any side effects. Continue your course of treatment even though you feel ill unless your doctor tells you to stop. In some cases, you may be given additional  medicines to help with side effects. Follow all directions for their use. Call your doctor or health care professional for advice if you get a fever, chills or sore throat, or other symptoms of a cold or flu. Do not treat yourself. This drug decreases your body's ability to fight infections. Try to avoid being around people who are sick. This medicine may increase your risk to bruise or bleed. Call your doctor or health care professional if you notice any unusual bleeding. Be careful brushing and flossing your teeth or using a toothpick because you may get an infection or bleed more easily. If you have any dental work done, tell your dentist you are receiving this medicine. Avoid taking products that contain aspirin, acetaminophen, ibuprofen, naproxen, or ketoprofen unless instructed by your doctor. These medicines may hide a fever. Do not become pregnant while taking this medicine. Women should inform their doctor if they wish to become pregnant or think they might be pregnant. There is a potential for serious side effects to an unborn child. Talk to your health care professional or pharmacist for more information. Do not breast-feed an infant while taking this medicine. Men should inform their doctor if they wish to father a child. This medicine may lower sperm counts. Do not treat diarrhea with over the counter products. Contact your doctor if you  have diarrhea that lasts more than 2 days or if it is severe and watery. This medicine can make you more sensitive to the sun. Keep out of the sun. If you cannot avoid being in the sun, wear protective clothing and use sunscreen. Do not use sun lamps or tanning beds/booths. What side effects may I notice from receiving this medication? Side effects that you should report to your doctor or health care professional as soon as possible: allergic reactions like skin rash, itching or hives, swelling of the face, lips, or tongue low blood counts - this medicine  may decrease the number of white blood cells, red blood cells and platelets. You may be at increased risk for infections and bleeding. signs of infection - fever or chills, cough, sore throat, pain or difficulty passing urine signs of decreased platelets or bleeding - bruising, pinpoint red spots on the skin, black, tarry stools, blood in the urine signs of decreased red blood cells - unusually weak or tired, fainting spells, lightheadedness breathing problems changes in vision chest pain mouth sores nausea and vomiting pain, swelling, redness at site where injected pain, tingling, numbness in the hands or feet redness, swelling, or sores on hands or feet stomach pain unusual bleeding Side effects that usually do not require medical attention (report to your doctor or health care professional if they continue or are bothersome): changes in finger or toe nails diarrhea dry or itchy skin hair loss headache loss of appetite sensitivity of eyes to the light stomach upset unusually teary eyes This list may not describe all possible side effects. Call your doctor for medical advice about side effects. You may report side effects to FDA at 1-800-FDA-1088. Where should I keep my medication? This drug is given in a hospital or clinic and will not be stored at home. NOTE: This sheet is a summary. It may not cover all possible information. If you have questions about this medicine, talk to your doctor, pharmacist, or health care provider.  2023 [employer]/Gold Standard (2021-08-10 00:00:00) The chemotherapy medication bag should finish at 46 hours, 96 hours, or 7 days. For example, if your pump is scheduled for 46 hours and it was put on at 4:00 p.m., it should finish at 2:00 p.m. the day it is scheduled to come off regardless of your appointment time.     Estimated time to finish at 10:30 a.m. on Thursday 05/09/2022.   If the display on your pump reads "Low Volume" and it is beeping, take the  batteries out of the pump and come to the cancer center for it to be taken off.   If the pump alarms go off prior to the pump reading "Low Volume" then call [phone] and someone can assist you.  If the plunger comes out and the chemotherapy medication is leaking out, please use your home chemo spill kit to clean up the spill. Do NOT use paper towels or other household products.  If you have problems or questions regarding your pump, please call either [phone] (24 hours a day) or the cancer center Monday-Friday 8:00 a.m.- 4:30 p.m. at the clinic number and we will assist you. If you are unable to get assistance, then go to the nearest Emergency Department and ask the staff to contact the IV team for assistance.

## 2022-05-09 ENCOUNTER — Inpatient Hospital Stay: Payer: Medicare Other

## 2022-05-09 VITALS — BP 139/70 | HR 64 | Temp 97.7°F | Resp 17

## 2022-05-09 DIAGNOSIS — C20 Malignant neoplasm of rectum: Secondary | ICD-10-CM

## 2022-05-09 DIAGNOSIS — Z5111 Encounter for antineoplastic chemotherapy: Secondary | ICD-10-CM | POA: Diagnosis not present

## 2022-05-09 MED ORDER — HEPARIN SOD (PORK) LOCK FLUSH 100 UNIT/ML IV SOLN
500.0000 [IU] | Freq: Once | INTRAVENOUS | Status: AC | PRN
  Administered 2022-05-09: 500 [IU]

## 2022-05-09 MED ORDER — SODIUM CHLORIDE 0.9% FLUSH
10.0000 mL | INTRAVENOUS | Status: DC | PRN
  Administered 2022-05-09: 10 mL

## 2022-05-09 NOTE — Patient Instructions (Signed)

## 2022-05-28 ENCOUNTER — Inpatient Hospital Stay (HOSPITAL_BASED_OUTPATIENT_CLINIC_OR_DEPARTMENT_OTHER): Payer: Medicare Other | Admitting: Oncology

## 2022-05-28 ENCOUNTER — Inpatient Hospital Stay: Payer: Medicare Other | Attending: Nurse Practitioner

## 2022-05-28 ENCOUNTER — Inpatient Hospital Stay: Payer: Medicare Other

## 2022-05-28 ENCOUNTER — Encounter: Payer: Self-pay | Admitting: *Deleted

## 2022-05-28 VITALS — BP 138/70 | HR 66 | Temp 98.6°F | Resp 20 | Ht 69.0 in | Wt 164.6 lb

## 2022-05-28 DIAGNOSIS — Z8042 Family history of malignant neoplasm of prostate: Secondary | ICD-10-CM | POA: Diagnosis not present

## 2022-05-28 DIAGNOSIS — C20 Malignant neoplasm of rectum: Secondary | ICD-10-CM

## 2022-05-28 DIAGNOSIS — R5381 Other malaise: Secondary | ICD-10-CM | POA: Insufficient documentation

## 2022-05-28 DIAGNOSIS — R972 Elevated prostate specific antigen [PSA]: Secondary | ICD-10-CM | POA: Diagnosis not present

## 2022-05-28 DIAGNOSIS — N2889 Other specified disorders of kidney and ureter: Secondary | ICD-10-CM | POA: Insufficient documentation

## 2022-05-28 DIAGNOSIS — Z5111 Encounter for antineoplastic chemotherapy: Secondary | ICD-10-CM | POA: Diagnosis not present

## 2022-05-28 DIAGNOSIS — I341 Nonrheumatic mitral (valve) prolapse: Secondary | ICD-10-CM | POA: Diagnosis not present

## 2022-05-28 LAB — CBC WITH DIFFERENTIAL (CANCER CENTER ONLY)
Abs Immature Granulocytes: 0.01 10*3/uL (ref 0.00–0.07)
Basophils Absolute: 0 10*3/uL (ref 0.0–0.1)
Basophils Relative: 0 %
Eosinophils Absolute: 0.3 10*3/uL (ref 0.0–0.5)
Eosinophils Relative: 6 %
HCT: 37.4 % — ABNORMAL LOW (ref 39.0–52.0)
Hemoglobin: 13 g/dL (ref 13.0–17.0)
Immature Granulocytes: 0 %
Lymphocytes Relative: 12 %
Lymphs Abs: 0.6 10*3/uL — ABNORMAL LOW (ref 0.7–4.0)
MCH: 28.6 pg (ref 26.0–34.0)
MCHC: 34.8 g/dL (ref 30.0–36.0)
MCV: 82.4 fL (ref 80.0–100.0)
Monocytes Absolute: 0.4 10*3/uL (ref 0.1–1.0)
Monocytes Relative: 8 %
Neutro Abs: 3.8 10*3/uL (ref 1.7–7.7)
Neutrophils Relative %: 74 %
Platelet Count: 250 10*3/uL (ref 150–400)
RBC: 4.54 MIL/uL (ref 4.22–5.81)
RDW: 14.8 % (ref 11.5–15.5)
WBC Count: 5.2 10*3/uL (ref 4.0–10.5)
nRBC: 0 % (ref 0.0–0.2)

## 2022-05-28 LAB — CMP (CANCER CENTER ONLY)
ALT: 9 U/L (ref 0–44)
AST: 10 U/L — ABNORMAL LOW (ref 15–41)
Albumin: 4 g/dL (ref 3.5–5.0)
Alkaline Phosphatase: 74 U/L (ref 38–126)
Anion gap: 9 (ref 5–15)
BUN: 19 mg/dL (ref 8–23)
CO2: 25 mmol/L (ref 22–32)
Calcium: 9.1 mg/dL (ref 8.9–10.3)
Chloride: 108 mmol/L (ref 98–111)
Creatinine: 0.92 mg/dL (ref 0.61–1.24)
GFR, Estimated: >60 mL/min (ref >60)
Glucose, Bld: 146 mg/dL — ABNORMAL HIGH (ref 70–99)
Potassium: 3.6 mmol/L (ref 3.5–5.1)
Sodium: 142 mmol/L (ref 135–145)
Total Bilirubin: 0.5 mg/dL (ref 0.3–1.2)
Total Protein: 6.3 g/dL — ABNORMAL LOW (ref 6.5–8.1)

## 2022-05-28 LAB — CEA (ACCESS): CEA (CHCC): 76.89 ng/mL — ABNORMAL HIGH (ref 0.00–5.00)

## 2022-05-28 MED ORDER — SODIUM CHLORIDE 0.9 % IV SOLN
1600.0000 mg/m2 | INTRAVENOUS | Status: DC
  Administered 2022-05-28: 3000 mg via INTRAVENOUS
  Filled 2022-05-28: qty 60

## 2022-05-28 NOTE — Patient Instructions (Addendum)
[name]   Discharge Instructions: Thank you for choosing [facility] to provide your oncology and hematology care.   If you have a lab appointment with the [facility], please go directly to the [facility] and check in at the registration area.   Wear comfortable clothing and clothing appropriate for easy access to any Portacath or PICC line.   We strive to give you quality time with your provider. You may need to reschedule your appointment if you arrive late (15 or more minutes).  Arriving late affects you and other patients whose appointments are after yours.  Also, if you miss three or more appointments without notifying the office, you may be dismissed from the clinic at the provider's discretion.      For prescription refill requests, have your pharmacy contact our office and allow 72 hours for refills to be completed.    Today you received the following chemotherapy and/or immunotherapy agents Flourouracil (ADRUCIL).      To help prevent nausea and vomiting after your treatment, we encourage you to take your nausea medication as directed.  BELOW ARE SYMPTOMS THAT SHOULD BE REPORTED IMMEDIATELY: *FEVER GREATER THAN 100.4 F (38 C) OR HIGHER *CHILLS OR SWEATING *NAUSEA AND VOMITING THAT IS NOT CONTROLLED WITH YOUR NAUSEA MEDICATION *UNUSUAL SHORTNESS OF BREATH *UNUSUAL BRUISING OR BLEEDING *URINARY PROBLEMS (pain or burning when urinating, or frequent urination) *BOWEL PROBLEMS (unusual diarrhea, constipation, pain near the anus) TENDERNESS IN MOUTH AND THROAT WITH OR WITHOUT PRESENCE OF ULCERS (sore throat, sores in mouth, or a toothache) UNUSUAL RASH, SWELLING OR PAIN  UNUSUAL VAGINAL DISCHARGE OR ITCHING   Items with * indicate a potential emergency and should be followed up as soon as possible or go to the Emergency Department if any problems should occur.  Please show the CHEMOTHERAPY ALERT CARD or IMMUNOTHERAPY ALERT CARD at  check-in to the Emergency Department and triage nurse.  Should you have questions after your visit or need to cancel or reschedule your appointment, please contact [facility]  Dept: [phone]  and follow the prompts.  Office hours are 8:00 a.m. to 4:30 p.m. Monday - Friday. Please note that voicemails left after 4:00 p.m. may not be returned until the following business day.  We are closed weekends and major holidays. You have access to a nurse at all times for urgent questions. Please call the main number to the clinic Dept: [phone] and follow the prompts.   For any non-urgent questions, you may also contact your provider using MyChart. We now offer e-Visits for anyone 22 and older to request care online for non-urgent symptoms. For details visit [email or web address].   Also download the MyChart app! Go to the app store, search "MyChart", open the app, select [facility], and log in with your MyChart username and password.  Masks are optional in the cancer centers. If you would like for your care team to wear a mask while they are taking care of you, please let them know. You may have one support person who is at least 82 years old accompany you for your appointments.  Fluorouracil Injection What is this medication? FLUOROURACIL (flure oh YOOR a sil) treats some types of cancer. It works by slowing down the growth of cancer cells. This medicine may be used for other purposes; ask your health care provider or pharmacist if you have questions. COMMON BRAND NAME(S): Adrucil What should I tell my care team before I  take this medication? They need to know if you have any of these conditions: Blood disorders Dihydropyrimidine dehydrogenase (DPD) deficiency Infection, such as chickenpox, cold sores, herpes Kidney disease Liver disease Poor nutrition Recent or ongoing radiation therapy An unusual or allergic reaction to fluorouracil, other medications, foods,  dyes, or preservatives If you or your partner are pregnant or trying to get pregnant Breast-feeding How should I use this medication? This medication is injected into a vein. It is administered by your care team in a hospital or clinic setting. Talk to your care team about the use of this medication in children. Special care may be needed. Overdosage: If you think you have taken too much of this medicine contact a poison control center or emergency room at once. NOTE: This medicine is only for you. Do not share this medicine with others. What if I miss a dose? Keep appointments for follow-up doses. It is important not to miss your dose. Call your care team if you are unable to keep an appointment. What may interact with this medication? Do not take this medication with any of the following: Live virus vaccines This medication may also interact with the following: Medications that treat or prevent blood clots, such as warfarin, enoxaparin, dalteparin This list may not describe all possible interactions. Give your health care provider a list of all the medicines, herbs, non-prescription drugs, or dietary supplements you use. Also tell them if you smoke, drink alcohol, or use illegal drugs. Some items may interact with your medicine. What should I watch for while using this medication? Your condition will be monitored carefully while you are receiving this medication. This medication may make you feel generally unwell. This is not uncommon as chemotherapy can affect healthy cells as well as cancer cells. Report any side effects. Continue your course of treatment even though you feel ill unless your care team tells you to stop. In some cases, you may be given additional medications to help with side effects. Follow all directions for their use. This medication may increase your risk of getting an infection. Call your care team for advice if you get a fever, chills, sore throat, or other symptoms of a  cold or flu. Do not treat yourself. Try to avoid being around people who are sick. This medication may increase your risk to bruise or bleed. Call your care team if you notice any unusual bleeding. Be careful brushing or flossing your teeth or using a toothpick because you may get an infection or bleed more easily. If you have any dental work done, tell your dentist you are receiving this medication. Avoid taking medications that contain aspirin, acetaminophen, ibuprofen, naproxen, or ketoprofen unless instructed by your care team. These medications may hide a fever. Do not treat diarrhea with over the counter products. Contact your care team if you have diarrhea that lasts more than 2 days or if it is severe and watery. This medication can make you more sensitive to the sun. Keep out of the sun. If you cannot avoid being in the sun, wear protective clothing and sunscreen. Do not use sun lamps, tanning beds, or tanning booths. Talk to your care team if you or your partner wish to become pregnant or think you might be pregnant. This medication can cause serious birth defects if taken during pregnancy and for 3 months after the last dose. A reliable form of contraception is recommended while taking this medication and for 3 months after the last dose. Talk to  your care team about effective forms of contraception. Do not father a child while taking this medication and for 3 months after the last dose. Use a condom while having sex during this time period. Do not breastfeed while taking this medication. This medication may cause infertility. Talk to your care team if you are concerned about your fertility. What side effects may I notice from receiving this medication? Side effects that you should report to your care team as soon as possible: Allergic reactions--skin rash, itching, hives, swelling of the face, lips, tongue, or throat Heart attack--pain or tightness in the chest, shoulders, arms, or jaw,  nausea, shortness of breath, cold or clammy skin, feeling faint or lightheaded Heart failure--shortness of breath, swelling of the ankles, feet, or hands, sudden weight gain, unusual weakness or fatigue Heart rhythm changes--fast or irregular heartbeat, dizziness, feeling faint or lightheaded, chest pain, trouble breathing High ammonia level--unusual weakness or fatigue, confusion, loss of appetite, nausea, vomiting, seizures Infection--fever, chills, cough, sore throat, wounds that don't heal, pain or trouble when passing urine, general feeling of discomfort or being unwell Low red blood cell level--unusual weakness or fatigue, dizziness, headache, trouble breathing Pain, tingling, or numbness in the hands or feet, muscle weakness, change in vision, confusion or trouble speaking, loss of balance or coordination, trouble walking, seizures Redness, swelling, and blistering of the skin over hands and feet Severe or prolonged diarrhea Unusual bruising or bleeding Side effects that usually do not require medical attention (report to your care team if they continue or are bothersome): Dry skin Headache Increased tears Nausea Pain, redness, or swelling with sores inside the mouth or throat Sensitivity to light Vomiting This list may not describe all possible side effects. Call your doctor for medical advice about side effects. You may report side effects to FDA at 1-800-FDA-1088. Where should I keep my medication? This medication is given in a hospital or clinic. It will not be stored at home. NOTE: This sheet is a summary. It may not cover all possible information. If you have questions about this medicine, talk to your doctor, pharmacist, or health care provider.  2023 Elsevier/Gold Standard (2022-01-15 00:00:00)  The chemotherapy medication bag should finish at 46 hours, 96 hours, or 7 days. For example, if your pump is scheduled for 46 hours and it was put on at 4:00 p.m., it should finish at  2:00 p.m. the day it is scheduled to come off regardless of your appointment time.     Estimated time to finish at 10:15 on Thursday 05/30/2022.   If the display on your pump reads "Low Volume" and it is beeping, take the batteries out of the pump and come to the cancer center for it to be taken off.   If the pump alarms go off prior to the pump reading "Low Volume" then call [phone] and someone can assist you.  If the plunger comes out and the chemotherapy medication is leaking out, please use your home chemo spill kit to clean up the spill. Do NOT use paper towels or other household products.  If you have problems or questions regarding your pump, please call either [phone] (24 hours a day) or the cancer center Monday-Friday 8:00 a.m.- 4:30 p.m. at the clinic number and we will assist you. If you are unable to get assistance, then go to the nearest Emergency Department and ask the staff to contact the IV team for assistance.

## 2022-05-28 NOTE — Progress Notes (Signed)
Patient seen by Dr. [name] today  Vitals are within treatment parameters.  Labs reviewed by Dr. [name] and are within treatment parameters. MD is aware of rise in CEA-proceed Per physician team, patient is ready for treatment and there are NO modifications to the treatment plan.

## 2022-05-28 NOTE — Progress Notes (Signed)
[facility] OFFICE PROGRESS NOTE   Diagnosis: Rectal cancer  INTERVAL HISTORY:   Neil Perry completed another cycle of 5-fluorouracil on 05/07/2022.  He reports mild malaise.  No nausea, mouth sores, or diarrhea.  He feels well.  Objective:  Vital signs in last 24 hours:  Blood pressure 138/70, pulse 66, temperature 98.6 F (37 C), temperature source Oral, resp. rate 20, height 5' 9"  (1.753 m), weight 164 lb 9.6 oz (74.7 kg), SpO2 100 %.    HEENT: No thrush or ulcers Resp: Lungs clear bilaterally Cardio: Regular rhythm with premature beats GI: No hepatosplenomegaly Vascular: The left lower leg is slightly larger than the right side, no edema   Portacath/PICC-without erythema  Lab Results:  Lab Results  Component Value Date   WBC 5.2 05/28/2022   HGB 13.0 05/28/2022   HCT 37.4 (L) 05/28/2022   MCV 82.4 05/28/2022   PLT 250 05/28/2022   NEUTROABS 3.8 05/28/2022    CMP  Lab Results  Component Value Date   NA 141 05/07/2022   K 3.9 05/07/2022   CL 107 05/07/2022   CO2 25 05/07/2022   GLUCOSE 139 (H) 05/07/2022   BUN 21 05/07/2022   CREATININE 1.08 05/07/2022   CALCIUM 9.2 05/07/2022   PROT 6.1 (L) 05/07/2022   ALBUMIN 4.0 05/07/2022   AST 11 (L) 05/07/2022   ALT 8 05/07/2022   ALKPHOS 78 05/07/2022   BILITOT 0.6 05/07/2022   GFRNONAA >60 05/07/2022    Lab Results  Component Value Date   CEA 47.03 (H) 05/07/2022     Medications: I have reviewed the patient's current medications.   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; foundation 1-microsatellite stable, tumor mutation burden 5, K-rasG 12V, NRAS  wildtype Colonoscopy 05/22/2021-ulcerated partially obstructing mass at 15 cm from anal verge CTs 05/30/2021-numerous small pulmonary nodules concerning for metastases, thickening of the rectum, multiple liver metastases Cycle 1 FOLFOX 06/12/2021 Cycle 2 FOLFOX 06/26/2021 Cycle 3 FOLFOX 07/10/2021, oxaliplatin dose reduced due to progressive decline in the [place] and platelet count Cycle 4 FOLFOX 07/24/2021 Cycle 5 FOLFOX 08/07/2021, 5-FU bolus eliminated and oxaliplatin dose reduced, insurance would not approve Udenyca CTs 08/15/2021-decrease size of lung nodules, decreased hepatic metastases, persistent anorectal wall thickening, stable right renal mass Cycle 6 FOLFOX 08/21/2021, 5-FU bolus and 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 Cycle 7 FOLFOX 09/04/2021, 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 Cycle 8 FOLFOX 09/18/2021, 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 Cycle 9 FOLFOX 10/08/2021, 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 Cycle 10 FOLFOX 10/23/2021, 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 CTs 11/01/2021-decrease in size of occasional small pulmonary nodules, continued decrease in size of multiple hypoenhancing hepatic metastases, unchanged posttreatment appearance of the low rectum, unchanged exophytic mass of the inferior pole of the right kidney measuring 1.5 x 1.3 cm Cycle 11 5-fluorouracil 11/06/2021 Cycle 12 5-fluorouracil 11/20/2021 Cycle 13 5-fluorouracil 12/04/2021 Maintenance Xeloda beginning 12/19/2021 Xeloda dose reduced to 1000 mg twice daily beginning 12/27/2021 Xeloda placed on hold 01/16/2022 CTs 01/22/2022-segment of distal ileum with circumferential wall thickening and perienteric inflammation, decrease in size of liver metastases, stable tiny bilateral pulmonary nodules, circumferential wall thickening in the rectum, stable lower pole right kidney lesion suspicious for renal cell carcinoma Xeloda discontinued due to  diarrhea 5-fluorouracil  pump 02/05/2022 5-fluorouracil pump 02/26/2022 Treatment held 03/18/2022 due to diarrhea 5-fluorouracil pump 03/25/2022, dose reduced due to diarrhea 5-fluorouracil pump 04/16/2022 CTs 05/03/2022-unchanged tiny pulmonary nodules, unchanged liver metastases, stable appearance of the low rectal wall thickening, unchanged posterior right renal mass 5-fluorouracil pump 05/07/2022 5-fluorouracil pump 05/28/2022 Prostate cancer-simple prostatectomy January 2019, Gleason 3+3, 10 to 12% of specimen Active surveillance, biopsy May 2019 with small focus of Gleason 3+3 disease in 1/6 cores, surveillance continued Elevated PSA 04/20/2020 Biopsy 06/19/2020 8/12 cores positive, Gleason 4+4 PET scan 07/07/2020-negative CT 07/07/2020 right iliac and retrocaval adenopathy, 1.3 cm subcapsular right lower pole renal lesion Androgen deprivation therapy beginning 08/21/2020 Radiation to prostate, seminal vesicles, and pelvic lymph nodes to 10/22 - 12/28/2020 He continues every 59-monthFirmagon and daily Xtandi   3.  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  Mitral valve prolapse 5.  Family history of prostate cancer 6.  08/15/2021 with leg weakness and an episode of slurred speech-TIA?,  Brain imaging negative 7.  Diarrhea and fatigue while on capecitabine April 2023-capecitabine discontinued 01/18/2022      Disposition: Mr. RAulettaappears stable.  He is tolerating the 5-fluorouracil well.  He will complete another cycle today.  He will undergo a restaging CT after the cycle of 5-fluorouracil 06/18/2022.  GBetsy Coder MD  05/28/2022  10:37 AM

## 2022-05-30 ENCOUNTER — Inpatient Hospital Stay: Payer: Medicare Other

## 2022-05-30 VITALS — BP 141/91 | HR 64 | Temp 98.0°F | Resp 18

## 2022-05-30 DIAGNOSIS — C20 Malignant neoplasm of rectum: Secondary | ICD-10-CM

## 2022-05-30 DIAGNOSIS — Z5111 Encounter for antineoplastic chemotherapy: Secondary | ICD-10-CM | POA: Diagnosis not present

## 2022-05-30 MED ORDER — SODIUM CHLORIDE 0.9% FLUSH
10.0000 mL | INTRAVENOUS | Status: DC | PRN
  Administered 2022-05-30: 10 mL

## 2022-05-30 MED ORDER — HEPARIN SOD (PORK) LOCK FLUSH 100 UNIT/ML IV SOLN
500.0000 [IU] | Freq: Once | INTRAVENOUS | Status: AC | PRN
  Administered 2022-05-30: 500 [IU]

## 2022-05-30 NOTE — Patient Instructions (Signed)
Heparin injection What is this medication? HEPARIN (HEP a rin) is an anticoagulant. It is used to treat or prevent clots in the veins, arteries, lungs, or heart. It stops clots from forming or getting bigger. This medicine prevents clotting during open-heart surgery, dialysis, or in patients who are confined to bed. This medicine may be used for other purposes; ask your health care provider or pharmacist if you have questions. COMMON BRAND NAME(S): Hep-Lock, Hep-Lock U/P, Hepflush-10, Monoject Prefill Advanced Heparin Lock Flush, SASH Normal Saline and Heparin What should I tell my care team before I take this medication? They need to know if you have any of these conditions: bleeding disorders, such as hemophilia or low blood platelets bowel disease or diverticulitis endocarditis high blood pressure liver disease recent surgery or delivery of a baby stomach ulcers an unusual or allergic reaction to heparin, benzyl alcohol, sulfites, other medicines, foods, dyes, or preservatives pregnant or trying to get pregnant breast-feeding How should I use this medication? This medicine is given by injection or infusion into a vein. It can also be given by injection of small amounts under the skin. It is usually given by a health care professional in a hospital or clinic setting. If you get this medicine at home, you will be taught how to prepare and give this medicine. Use exactly as directed. Take your medicine at regular intervals. Do not take it more often than directed. Do not stop taking except on your doctor's advice. Stopping this medicine may increase your risk of a blot clot. Be sure to refill your prescription before you run out of medicine. It is important that you put your used needles and syringes in a special sharps container. Do not put them in a trash can. If you do not have a sharps container, call your pharmacist or healthcare provider to get one. Talk to your pediatrician regarding the  use of this medicine in children. While this medicine may be prescribed for children for selected conditions, precautions do apply. Overdosage: If you think you have taken too much of this medicine contact a poison control center or emergency room at once. NOTE: This medicine is only for you. Do not share this medicine with others. What if I miss a dose? If you miss a dose, take it as soon as you can. If it is almost time for your next dose, take only that dose. Do not take double or extra doses. What may interact with this medication? Do not take this medicine with any of the following medications: aspirin and aspirin-like drugs mifepristone medicines that treat or prevent blood clots like warfarin, enoxaparin, and dalteparin palifermin protamine This medicine may also interact with the following medications: dextran digoxin hydroxychloroquine medicines for treating colds or allergies nicotine NSAIDs, medicines for pain and inflammation, like ibuprofen or naproxen phenylbutazone tetracycline antibiotics This list may not describe all possible interactions. Give your health care provider a list of all the medicines, herbs, non-prescription drugs, or dietary supplements you use. Also tell them if you smoke, drink alcohol, or use illegal drugs. Some items may interact with your medicine. What should I watch for while using this medication? Visit your healthcare professional for regular checks on your progress. You may need blood work done while you are taking this medicine. Your condition will be monitored carefully while you are receiving this medicine. It is important not to miss any appointments. Wear a medical ID bracelet or chain, and carry a card that describes your disease and details   of your medicine and dosage times. Notify your doctor or healthcare professional at once if you have cold, blue hands or feet. If you are going to need surgery or other procedure, tell your healthcare  professional that you are using this medicine. Avoid sports and activities that might cause injury while you are using this medicine. Severe falls or injuries can cause unseen bleeding. Be careful when using sharp tools or knives. Consider using an electric razor. Take special care brushing or flossing your teeth. Report any injuries, bruising, or red spots on the skin to your healthcare professional. Using this medicine for a long time may weaken your bones and increase the risk of bone fractures. You should make sure that you get enough calcium and vitamin D while you are taking this medicine. Discuss the foods you eat and the vitamins you take with your healthcare professional. Wear a medical ID bracelet or chain. Carry a card that describes your disease and details of your medicine and dosage times. What side effects may I notice from receiving this medication? Side effects that you should report to your doctor or health care professional as soon as possible: allergic reactions like skin rash, itching or hives, swelling of the face, lips, or tongue bone pain fever, chills nausea, vomiting signs and symptoms of bleeding such as bloody or black, tarry stools; red or dark-brown urine; spitting up blood or brown material that looks like coffee grounds; red spots on the skin; unusual bruising or bleeding from the eye, gums, or nose signs and symptoms of a blood clot such as chest pain; shortness of breath; pain, swelling, or warmth in the leg signs and symptoms of a stroke such as changes in vision; confusion; trouble speaking or understanding; severe headaches; sudden numbness or weakness of the face, arm or leg; trouble walking; dizziness; loss of coordination Side effects that usually do not require medical attention (report to your doctor or health care professional if they continue or are bothersome): hair loss pain, redness, or irritation at site where injected This list may not describe all  possible side effects. Call your doctor for medical advice about side effects. You may report side effects to FDA at 1-800-FDA-1088. Where should I keep my medication? Keep out of the reach of children. Store unopened vials at room temperature between 15 and 30 degrees C (59 and 86 degrees F). Do not freeze. Do not use if solution is discolored or particulate matter is present. Throw away any unused medicine after the expiration date. NOTE: This sheet is a summary. It may not cover all possible information. If you have questions about this medicine, talk to your doctor, pharmacist, or health care provider.  2023 Elsevier/Gold Standard (2005-06-17 00:00:00)  

## 2022-06-07 ENCOUNTER — Other Ambulatory Visit: Payer: Self-pay | Admitting: Family Medicine

## 2022-06-17 ENCOUNTER — Telehealth: Payer: Self-pay | Admitting: *Deleted

## 2022-06-17 NOTE — Patient Instructions (Signed)
Visit Information  Thank you for taking time to visit with me today. Please don't hesitate to contact me if I can be of assistance to you.   Following are the goals we discussed today:   Goals Addressed               This Visit's Progress     COMPLETED: no needs (pt-stated)        Care Coordination Interventions: Advised patient to schedule AWV with his provider Provided education to patient and/or caregiver about advanced directives Reviewed medications with patient and discussed adherence to all medications  and educated accordingly. Reviewed scheduled/upcoming provider appointments including pending appointments Assessed social determinant of health barriers          Please call the care guide team at [phone] if you need to cancel or reschedule your appointment.   If you are experiencing a Mental Health or [place] or need someone to talk to, please call the Suicide and Crisis Lifeline: [phone]  Patient verbalizes understanding of instructions and care plan provided today and agrees to view in [place]. Active MyChart status and patient understanding of how to access instructions and care plan via MyChart confirmed with patient.     No further follow up required: No needs    Raina Mina, RN Care Management Coordinator [place] Office [phone]

## 2022-06-17 NOTE — Patient Outreach (Signed)
  Care Coordination   Initial Visit Note   06/17/2022 Name: Neil Perry MRN: [id number] DOB: 11-05-1940  Neil Perry is a 81 y.o. year old male who sees Neil Barrack, MD for primary care. I spoke with  Timoteo Ace by phone today.  What matters to the patients health and wellness today?  No needs    Goals Addressed               This Visit's Progress     COMPLETED: no needs (pt-stated)        Care Coordination Interventions: Advised patient to schedule AWV with his provider Provided education to patient and/or caregiver about advanced directives Reviewed medications with patient and discussed adherence to all medications  and educated accordingly. Reviewed scheduled/upcoming provider appointments including pending appointments Assessed social determinant of health barriers          SDOH assessments and interventions completed:  Yes  SDOH Interventions Today    Flowsheet Row Most Recent Value  SDOH Interventions   Food Insecurity Interventions Intervention Not Indicated  Housing Interventions Intervention Not Indicated  Transportation Interventions Intervention Not Indicated        Care Coordination Interventions Activated:  Yes  Care Coordination Interventions:  Yes, provided   Follow up plan: No further intervention required.   Encounter Outcome:  Pt. Visit Completed   Raina Mina, RN Care Management Coordinator [place] Office [phone]

## 2022-06-18 ENCOUNTER — Inpatient Hospital Stay: Payer: Medicare Other

## 2022-06-18 ENCOUNTER — Encounter: Payer: Self-pay | Admitting: Nurse Practitioner

## 2022-06-18 ENCOUNTER — Inpatient Hospital Stay (HOSPITAL_BASED_OUTPATIENT_CLINIC_OR_DEPARTMENT_OTHER): Payer: Medicare Other | Admitting: Nurse Practitioner

## 2022-06-18 ENCOUNTER — Telehealth: Payer: Self-pay

## 2022-06-18 VITALS — BP 140/74 | HR 63 | Temp 98.1°F | Resp 18 | Ht 69.0 in | Wt 164.0 lb

## 2022-06-18 DIAGNOSIS — C20 Malignant neoplasm of rectum: Secondary | ICD-10-CM

## 2022-06-18 DIAGNOSIS — Z5111 Encounter for antineoplastic chemotherapy: Secondary | ICD-10-CM | POA: Diagnosis not present

## 2022-06-18 LAB — CBC WITH DIFFERENTIAL (CANCER CENTER ONLY)
Abs Immature Granulocytes: 0.01 10*3/uL (ref 0.00–0.07)
Basophils Absolute: 0 10*3/uL (ref 0.0–0.1)
Basophils Relative: 0 %
Eosinophils Absolute: 0.4 10*3/uL (ref 0.0–0.5)
Eosinophils Relative: 6 %
HCT: 37.6 % — ABNORMAL LOW (ref 39.0–52.0)
Hemoglobin: 12.9 g/dL — ABNORMAL LOW (ref 13.0–17.0)
Immature Granulocytes: 0 %
Lymphocytes Relative: 11 %
Lymphs Abs: 0.7 10*3/uL (ref 0.7–4.0)
MCH: 28.5 pg (ref 26.0–34.0)
MCHC: 34.3 g/dL (ref 30.0–36.0)
MCV: 83 fL (ref 80.0–100.0)
Monocytes Absolute: 0.6 10*3/uL (ref 0.1–1.0)
Monocytes Relative: 9 %
Neutro Abs: 4.9 10*3/uL (ref 1.7–7.7)
Neutrophils Relative %: 74 %
Platelet Count: 267 10*3/uL (ref 150–400)
RBC: 4.53 MIL/uL (ref 4.22–5.81)
RDW: 15 % (ref 11.5–15.5)
WBC Count: 6.6 10*3/uL (ref 4.0–10.5)
nRBC: 0 % (ref 0.0–0.2)

## 2022-06-18 LAB — CMP (CANCER CENTER ONLY)
ALT: 7 U/L (ref 0–44)
AST: 10 U/L — ABNORMAL LOW (ref 15–41)
Albumin: 4.1 g/dL (ref 3.5–5.0)
Alkaline Phosphatase: 89 U/L (ref 38–126)
Anion gap: 10 (ref 5–15)
BUN: 17 mg/dL (ref 8–23)
CO2: 26 mmol/L (ref 22–32)
Calcium: 9.4 mg/dL (ref 8.9–10.3)
Chloride: 106 mmol/L (ref 98–111)
Creatinine: 1 mg/dL (ref 0.61–1.24)
GFR, Estimated: >60 mL/min (ref >60)
Glucose, Bld: 100 mg/dL — ABNORMAL HIGH (ref 70–99)
Potassium: 4.1 mmol/L (ref 3.5–5.1)
Sodium: 142 mmol/L (ref 135–145)
Total Bilirubin: 0.6 mg/dL (ref 0.3–1.2)
Total Protein: 6.5 g/dL (ref 6.5–8.1)

## 2022-06-18 LAB — CEA (ACCESS): CEA (CHCC): 141.3 ng/mL — ABNORMAL HIGH (ref 0.00–5.00)

## 2022-06-18 MED ORDER — SODIUM CHLORIDE 0.9 % IV SOLN
1600.0000 mg/m2 | INTRAVENOUS | Status: DC
  Administered 2022-06-18: 3000 mg via INTRAVENOUS
  Filled 2022-06-18: qty 50

## 2022-06-18 NOTE — Progress Notes (Signed)
Patient seen by Lisa Thomas NP today  Vitals are within treatment parameters.  Labs reviewed by Lisa Thomas NP and are within treatment parameters.  Per physician team, patient is ready for treatment and there are NO modifications to the treatment plan.     

## 2022-06-18 NOTE — Telephone Encounter (Signed)
Patient is schedule for Ct scan at [facility] on 07/04/22 at 6 pm, the patient is aware of his appointment.

## 2022-06-18 NOTE — Progress Notes (Signed)
[facility] OFFICE PROGRESS NOTE   Diagnosis: Rectal cancer  INTERVAL HISTORY:   Neil Perry returns as scheduled.  He completed another cycle of 5-fluorouracil 05/28/2022.  He denies nausea/vomiting.  No mouth sores.  No diarrhea.  No hand or foot pain or redness.  Main complaint is fatigue.  He has a good appetite.  Objective:  Vital signs in last 24 hours:  Blood pressure (!) 140/74, pulse 63, temperature 98.1 F (36.7 C), temperature source Oral, resp. rate 18, height _0  (1.753 m), weight 164 lb (74.4 kg), SpO2 100 %.    HEENT: No thrush or ulcers. Resp: Lungs clear bilaterally. Cardio: Regular rate and rhythm. GI: Abdomen soft and nontender.  No hepatosplenomegaly. Vascular: No leg edema. Skin: Palms without erythema. Portacath without erythema.   Lab Results:  Lab Results  Component Value Date   WBC 6.6 06/18/2022   HGB 12.9 (L) 06/18/2022   HCT 37.6 (L) 06/18/2022   MCV 83.0 06/18/2022   PLT 267 06/18/2022   NEUTROABS 4.9 06/18/2022    Imaging:  No results found.  Medications: I have reviewed the patient's current medications.  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; foundation 1-microsatellite stable, tumor mutation burden 5, K-rasG 12V, NRAS wildtype Colonoscopy 05/22/2021-ulcerated partially obstructing mass at 15 cm from anal verge CTs 05/30/2021-numerous small pulmonary nodules concerning for metastases, thickening of the rectum, multiple liver metastases Cycle 1 FOLFOX 06/12/2021 Cycle 2 FOLFOX 06/26/2021 Cycle 3 FOLFOX 07/10/2021, oxaliplatin dose reduced due to progressive decline in the [place] and platelet  count Cycle 4 FOLFOX 07/24/2021 Cycle 5 FOLFOX 08/07/2021, 5-FU bolus eliminated and oxaliplatin dose reduced, insurance would not approve Udenyca CTs 08/15/2021-decrease size of lung nodules, decreased hepatic metastases, persistent anorectal wall thickening, stable right renal mass Cycle 6 FOLFOX 08/21/2021, 5-FU bolus and 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 Cycle 7 FOLFOX 09/04/2021, 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 Cycle 8 FOLFOX 09/18/2021, 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 Cycle 9 FOLFOX 10/08/2021, 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 Cycle 10 FOLFOX 10/23/2021, 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 CTs 11/01/2021-decrease in size of occasional small pulmonary nodules, continued decrease in size of multiple hypoenhancing hepatic metastases, unchanged posttreatment appearance of the low rectum, unchanged exophytic mass of the inferior pole of the right kidney measuring 1.5 x 1.3 cm Cycle 11 5-fluorouracil 11/06/2021 Cycle 12 5-fluorouracil 11/20/2021 Cycle 13 5-fluorouracil 12/04/2021 Maintenance Xeloda beginning 12/19/2021 Xeloda dose reduced to 1000 mg twice daily beginning 12/27/2021 Xeloda placed on hold 01/16/2022 CTs 01/22/2022-segment of distal ileum with circumferential wall thickening and perienteric inflammation, decrease in size of liver metastases, stable tiny bilateral pulmonary nodules, circumferential wall thickening in the rectum, stable lower pole right kidney lesion suspicious for renal cell carcinoma Xeloda discontinued due to diarrhea 5-fluorouracil pump 02/05/2022 5-fluorouracil pump 02/26/2022 Treatment held 03/18/2022 due to diarrhea 5-fluorouracil pump 03/25/2022, dose reduced due to diarrhea 5-fluorouracil pump 04/16/2022 CTs 05/03/2022-unchanged tiny pulmonary nodules, unchanged liver metastases, stable appearance of the low rectal wall thickening, unchanged posterior right renal mass 5-fluorouracil  pump 05/07/2022 5-fluorouracil pump 05/28/2022 5-fluorouracil pump 06/18/2022 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  Mitral valve prolapse 5.  Family history of prostate cancer 6.  08/15/2021 with leg weakness and an episode of slurred speech-TIA?,  Brain imaging negative 7.  Diarrhea and fatigue while on capecitabine April 2023-capecitabine discontinued 01/18/2022    Disposition: Mr. RJustenappears stable.  He is on active treatment with 5-fluorouracil every 3 weeks.  There is no clinical evidence of disease progression.  Plan to continue the same, treatment today.  Restaging CTs prior to next office visit.  CBC and chemistry panel reviewed.  Labs adequate to proceed as above.  He will return for follow-up in 3 weeks.  We are available to see him sooner if needed.    LNed CardANP/GNP-BC   06/18/2022  10:26 AM

## 2022-06-18 NOTE — Patient Instructions (Signed)
[name]   Discharge Instructions: Thank you for choosing [facility] to provide your oncology and hematology care.   If you have a lab appointment with the [facility], please go directly to the [facility] and check in at the registration area.   Wear comfortable clothing and clothing appropriate for easy access to any Portacath or PICC line.   We strive to give you quality time with your provider. You may need to reschedule your appointment if you arrive late (15 or more minutes).  Arriving late affects you and other patients whose appointments are after yours.  Also, if you miss three or more appointments without notifying the office, you may be dismissed from the clinic at the provider's discretion.      For prescription refill requests, have your pharmacy contact our office and allow 72 hours for refills to be completed.    Today you received the following chemotherapy and/or immunotherapy agents Flourouracil (ADRUCIL).      To help prevent nausea and vomiting after your treatment, we encourage you to take your nausea medication as directed.  BELOW ARE SYMPTOMS THAT SHOULD BE REPORTED IMMEDIATELY: *FEVER GREATER THAN 100.4 F (38 C) OR HIGHER *CHILLS OR SWEATING *NAUSEA AND VOMITING THAT IS NOT CONTROLLED WITH YOUR NAUSEA MEDICATION *UNUSUAL SHORTNESS OF BREATH *UNUSUAL BRUISING OR BLEEDING *URINARY PROBLEMS (pain or burning when urinating, or frequent urination) *BOWEL PROBLEMS (unusual diarrhea, constipation, pain near the anus) TENDERNESS IN MOUTH AND THROAT WITH OR WITHOUT PRESENCE OF ULCERS (sore throat, sores in mouth, or a toothache) UNUSUAL RASH, SWELLING OR PAIN  UNUSUAL VAGINAL DISCHARGE OR ITCHING   Items with * indicate a potential emergency and should be followed up as soon as possible or go to the Emergency Department if any problems should occur.  Please show the CHEMOTHERAPY ALERT CARD or IMMUNOTHERAPY ALERT CARD at  check-in to the Emergency Department and triage nurse.  Should you have questions after your visit or need to cancel or reschedule your appointment, please contact [facility]  Dept: [phone]  and follow the prompts.  Office hours are 8:00 a.m. to 4:30 p.m. Monday - Friday. Please note that voicemails left after 4:00 p.m. may not be returned until the following business day.  We are closed weekends and major holidays. You have access to a nurse at all times for urgent questions. Please call the main number to the clinic Dept: [phone] and follow the prompts.   For any non-urgent questions, you may also contact your provider using MyChart. We now offer e-Visits for anyone 22 and older to request care online for non-urgent symptoms. For details visit [email or web address].   Also download the MyChart app! Go to the app store, search "MyChart", open the app, select [facility], and log in with your MyChart username and password.  Masks are optional in the cancer centers. If you would like for your care team to wear a mask while they are taking care of you, please let them know. You may have one support person who is at least 82 years old accompany you for your appointments.  Fluorouracil Injection What is this medication? FLUOROURACIL (flure oh YOOR a sil) treats some types of cancer. It works by slowing down the growth of cancer cells. This medicine may be used for other purposes; ask your health care provider or pharmacist if you have questions. COMMON BRAND NAME(S): Adrucil What should I tell my care team before I  take this medication? They need to know if you have any of these conditions: Blood disorders Dihydropyrimidine dehydrogenase (DPD) deficiency Infection, such as chickenpox, cold sores, herpes Kidney disease Liver disease Poor nutrition Recent or ongoing radiation therapy An unusual or allergic reaction to fluorouracil, other medications, foods,  dyes, or preservatives If you or your partner are pregnant or trying to get pregnant Breast-feeding How should I use this medication? This medication is injected into a vein. It is administered by your care team in a hospital or clinic setting. Talk to your care team about the use of this medication in children. Special care may be needed. Overdosage: If you think you have taken too much of this medicine contact a poison control center or emergency room at once. NOTE: This medicine is only for you. Do not share this medicine with others. What if I miss a dose? Keep appointments for follow-up doses. It is important not to miss your dose. Call your care team if you are unable to keep an appointment. What may interact with this medication? Do not take this medication with any of the following: Live virus vaccines This medication may also interact with the following: Medications that treat or prevent blood clots, such as warfarin, enoxaparin, dalteparin This list may not describe all possible interactions. Give your health care provider a list of all the medicines, herbs, non-prescription drugs, or dietary supplements you use. Also tell them if you smoke, drink alcohol, or use illegal drugs. Some items may interact with your medicine. What should I watch for while using this medication? Your condition will be monitored carefully while you are receiving this medication. This medication may make you feel generally unwell. This is not uncommon as chemotherapy can affect healthy cells as well as cancer cells. Report any side effects. Continue your course of treatment even though you feel ill unless your care team tells you to stop. In some cases, you may be given additional medications to help with side effects. Follow all directions for their use. This medication may increase your risk of getting an infection. Call your care team for advice if you get a fever, chills, sore throat, or other symptoms of a  cold or flu. Do not treat yourself. Try to avoid being around people who are sick. This medication may increase your risk to bruise or bleed. Call your care team if you notice any unusual bleeding. Be careful brushing or flossing your teeth or using a toothpick because you may get an infection or bleed more easily. If you have any dental work done, tell your dentist you are receiving this medication. Avoid taking medications that contain aspirin, acetaminophen, ibuprofen, naproxen, or ketoprofen unless instructed by your care team. These medications may hide a fever. Do not treat diarrhea with over the counter products. Contact your care team if you have diarrhea that lasts more than 2 days or if it is severe and watery. This medication can make you more sensitive to the sun. Keep out of the sun. If you cannot avoid being in the sun, wear protective clothing and sunscreen. Do not use sun lamps, tanning beds, or tanning booths. Talk to your care team if you or your partner wish to become pregnant or think you might be pregnant. This medication can cause serious birth defects if taken during pregnancy and for 3 months after the last dose. A reliable form of contraception is recommended while taking this medication and for 3 months after the last dose. Talk to  your care team about effective forms of contraception. Do not father a child while taking this medication and for 3 months after the last dose. Use a condom while having sex during this time period. Do not breastfeed while taking this medication. This medication may cause infertility. Talk to your care team if you are concerned about your fertility. What side effects may I notice from receiving this medication? Side effects that you should report to your care team as soon as possible: Allergic reactions--skin rash, itching, hives, swelling of the face, lips, tongue, or throat Heart attack--pain or tightness in the chest, shoulders, arms, or jaw,  nausea, shortness of breath, cold or clammy skin, feeling faint or lightheaded Heart failure--shortness of breath, swelling of the ankles, feet, or hands, sudden weight gain, unusual weakness or fatigue Heart rhythm changes--fast or irregular heartbeat, dizziness, feeling faint or lightheaded, chest pain, trouble breathing High ammonia level--unusual weakness or fatigue, confusion, loss of appetite, nausea, vomiting, seizures Infection--fever, chills, cough, sore throat, wounds that don't heal, pain or trouble when passing urine, general feeling of discomfort or being unwell Low red blood cell level--unusual weakness or fatigue, dizziness, headache, trouble breathing Pain, tingling, or numbness in the hands or feet, muscle weakness, change in vision, confusion or trouble speaking, loss of balance or coordination, trouble walking, seizures Redness, swelling, and blistering of the skin over hands and feet Severe or prolonged diarrhea Unusual bruising or bleeding Side effects that usually do not require medical attention (report to your care team if they continue or are bothersome): Dry skin Headache Increased tears Nausea Pain, redness, or swelling with sores inside the mouth or throat Sensitivity to light Vomiting This list may not describe all possible side effects. Call your doctor for medical advice about side effects. You may report side effects to FDA at 1-800-FDA-1088. Where should I keep my medication? This medication is given in a hospital or clinic. It will not be stored at home. NOTE: This sheet is a summary. It may not cover all possible information. If you have questions about this medicine, talk to your doctor, pharmacist, or health care provider.  2023 Elsevier/Gold Standard (2022-01-15 00:00:00)  The chemotherapy medication bag should finish at 46 hours, 96 hours, or 7 days. For example, if your pump is scheduled for 46 hours and it was put on at 4:00 p.m., it should finish at  2:00 p.m. the day it is scheduled to come off regardless of your appointment time.     Estimated time to finish at 09:30 a.m. on Thursday 06/20/2022.   If the display on your pump reads "Low Volume" and it is beeping, take the batteries out of the pump and come to the cancer center for it to be taken off.   If the pump alarms go off prior to the pump reading "Low Volume" then call [phone] and someone can assist you.  If the plunger comes out and the chemotherapy medication is leaking out, please use your home chemo spill kit to clean up the spill. Do NOT use paper towels or other household products.  If you have problems or questions regarding your pump, please call either [phone] (24 hours a day) or the cancer center Monday-Friday 8:00 a.m.- 4:30 p.m. at the clinic number and we will assist you. If you are unable to get assistance, then go to the nearest Emergency Department and ask the staff to contact the IV team for assistance.

## 2022-06-20 ENCOUNTER — Inpatient Hospital Stay: Payer: Medicare Other

## 2022-06-20 VITALS — BP 152/72 | HR 63 | Temp 98.7°F | Resp 20

## 2022-06-20 DIAGNOSIS — Z5111 Encounter for antineoplastic chemotherapy: Secondary | ICD-10-CM | POA: Diagnosis not present

## 2022-06-20 DIAGNOSIS — C20 Malignant neoplasm of rectum: Secondary | ICD-10-CM

## 2022-06-20 MED ORDER — SODIUM CHLORIDE 0.9% FLUSH: 10.0000 mL | INTRAVENOUS | Status: DC | PRN

## 2022-06-20 MED ORDER — HEPARIN SOD (PORK) LOCK FLUSH 100 UNIT/ML IV SOLN: 500.0000 [IU] | Freq: Once | INTRAVENOUS | Status: DC | PRN

## 2022-06-20 NOTE — Patient Instructions (Signed)
Heparin injection What is this medication? HEPARIN (HEP a rin) is an anticoagulant. It is used to treat or prevent clots in the veins, arteries, lungs, or heart. It stops clots from forming or getting bigger. This medicine prevents clotting during open-heart surgery, dialysis, or in patients who are confined to bed. This medicine may be used for other purposes; ask your health care provider or pharmacist if you have questions. COMMON BRAND NAME(S): Hep-Lock, Hep-Lock U/P, Hepflush-10, Monoject Prefill Advanced Heparin Lock Flush, SASH Normal Saline and Heparin What should I tell my care team before I take this medication? They need to know if you have any of these conditions: bleeding disorders, such as hemophilia or low blood platelets bowel disease or diverticulitis endocarditis high blood pressure liver disease recent surgery or delivery of a baby stomach ulcers an unusual or allergic reaction to heparin, benzyl alcohol, sulfites, other medicines, foods, dyes, or preservatives pregnant or trying to get pregnant breast-feeding How should I use this medication? This medicine is given by injection or infusion into a vein. It can also be given by injection of small amounts under the skin. It is usually given by a health care professional in a hospital or clinic setting. If you get this medicine at home, you will be taught how to prepare and give this medicine. Use exactly as directed. Take your medicine at regular intervals. Do not take it more often than directed. Do not stop taking except on your doctor's advice. Stopping this medicine may increase your risk of a blot clot. Be sure to refill your prescription before you run out of medicine. It is important that you put your used needles and syringes in a special sharps container. Do not put them in a trash can. If you do not have a sharps container, call your pharmacist or healthcare provider to get one. Talk to your pediatrician regarding the  use of this medicine in children. While this medicine may be prescribed for children for selected conditions, precautions do apply. Overdosage: If you think you have taken too much of this medicine contact a poison control center or emergency room at once. NOTE: This medicine is only for you. Do not share this medicine with others. What if I miss a dose? If you miss a dose, take it as soon as you can. If it is almost time for your next dose, take only that dose. Do not take double or extra doses. What may interact with this medication? Do not take this medicine with any of the following medications: aspirin and aspirin-like drugs mifepristone medicines that treat or prevent blood clots like warfarin, enoxaparin, and dalteparin palifermin protamine This medicine may also interact with the following medications: dextran digoxin hydroxychloroquine medicines for treating colds or allergies nicotine NSAIDs, medicines for pain and inflammation, like ibuprofen or naproxen phenylbutazone tetracycline antibiotics This list may not describe all possible interactions. Give your health care provider a list of all the medicines, herbs, non-prescription drugs, or dietary supplements you use. Also tell them if you smoke, drink alcohol, or use illegal drugs. Some items may interact with your medicine. What should I watch for while using this medication? Visit your healthcare professional for regular checks on your progress. You may need blood work done while you are taking this medicine. Your condition will be monitored carefully while you are receiving this medicine. It is important not to miss any appointments. Wear a medical ID bracelet or chain, and carry a card that describes your disease and details   of your medicine and dosage times. Notify your doctor or healthcare professional at once if you have cold, blue hands or feet. If you are going to need surgery or other procedure, tell your healthcare  professional that you are using this medicine. Avoid sports and activities that might cause injury while you are using this medicine. Severe falls or injuries can cause unseen bleeding. Be careful when using sharp tools or knives. Consider using an electric razor. Take special care brushing or flossing your teeth. Report any injuries, bruising, or red spots on the skin to your healthcare professional. Using this medicine for a long time may weaken your bones and increase the risk of bone fractures. You should make sure that you get enough calcium and vitamin D while you are taking this medicine. Discuss the foods you eat and the vitamins you take with your healthcare professional. Wear a medical ID bracelet or chain. Carry a card that describes your disease and details of your medicine and dosage times. What side effects may I notice from receiving this medication? Side effects that you should report to your doctor or health care professional as soon as possible: allergic reactions like skin rash, itching or hives, swelling of the face, lips, or tongue bone pain fever, chills nausea, vomiting signs and symptoms of bleeding such as bloody or black, tarry stools; red or dark-brown urine; spitting up blood or brown material that looks like coffee grounds; red spots on the skin; unusual bruising or bleeding from the eye, gums, or nose signs and symptoms of a blood clot such as chest pain; shortness of breath; pain, swelling, or warmth in the leg signs and symptoms of a stroke such as changes in vision; confusion; trouble speaking or understanding; severe headaches; sudden numbness or weakness of the face, arm or leg; trouble walking; dizziness; loss of coordination Side effects that usually do not require medical attention (report to your doctor or health care professional if they continue or are bothersome): hair loss pain, redness, or irritation at site where injected This list may not describe all  possible side effects. Call your doctor for medical advice about side effects. You may report side effects to FDA at 1-800-FDA-1088. Where should I keep my medication? Keep out of the reach of children. Store unopened vials at room temperature between 15 and 30 degrees C (59 and 86 degrees F). Do not freeze. Do not use if solution is discolored or particulate matter is present. Throw away any unused medicine after the expiration date. NOTE: This sheet is a summary. It may not cover all possible information. If you have questions about this medicine, talk to your doctor, pharmacist, or health care provider.  2023 Elsevier/Gold Standard (2005-06-17 00:00:00)  

## 2022-07-04 ENCOUNTER — Ambulatory Visit (HOSPITAL_BASED_OUTPATIENT_CLINIC_OR_DEPARTMENT_OTHER)
Admission: RE | Admit: 2022-07-04 | Discharge: 2022-07-04 | Disposition: A | Discharge status: Home / Self Care | Payer: Medicare Other | Source: Ambulatory Visit | Attending: Nurse Practitioner | Admitting: Nurse Practitioner

## 2022-07-04 DIAGNOSIS — C20 Malignant neoplasm of rectum: Secondary | ICD-10-CM | POA: Diagnosis present

## 2022-07-04 MED ORDER — IOHEXOL 300 MG/ML  SOLN
100.0000 mL | Freq: Once | INTRAMUSCULAR | Status: AC | PRN
  Administered 2022-07-04: 80 mL via INTRAVENOUS

## 2022-07-07 ENCOUNTER — Other Ambulatory Visit: Payer: Self-pay | Admitting: Oncology

## 2022-07-09 ENCOUNTER — Inpatient Hospital Stay (HOSPITAL_BASED_OUTPATIENT_CLINIC_OR_DEPARTMENT_OTHER): Payer: Medicare Other | Admitting: Oncology

## 2022-07-09 ENCOUNTER — Inpatient Hospital Stay: Payer: Medicare Other

## 2022-07-09 ENCOUNTER — Inpatient Hospital Stay: Payer: Medicare Other | Attending: Nurse Practitioner

## 2022-07-09 ENCOUNTER — Encounter: Payer: Self-pay | Admitting: *Deleted

## 2022-07-09 VITALS — BP 150/78 | HR 78 | Temp 98.1°F | Resp 18 | Ht 69.0 in | Wt 160.2 lb

## 2022-07-09 DIAGNOSIS — C20 Malignant neoplasm of rectum: Secondary | ICD-10-CM

## 2022-07-09 DIAGNOSIS — C186 Malignant neoplasm of descending colon: Secondary | ICD-10-CM | POA: Insufficient documentation

## 2022-07-09 DIAGNOSIS — Z23 Encounter for immunization: Secondary | ICD-10-CM

## 2022-07-09 DIAGNOSIS — K5900 Constipation, unspecified: Secondary | ICD-10-CM | POA: Diagnosis not present

## 2022-07-09 DIAGNOSIS — R197 Diarrhea, unspecified: Secondary | ICD-10-CM | POA: Diagnosis not present

## 2022-07-09 DIAGNOSIS — C787 Secondary malignant neoplasm of liver and intrahepatic bile duct: Secondary | ICD-10-CM | POA: Insufficient documentation

## 2022-07-09 DIAGNOSIS — R5381 Other malaise: Secondary | ICD-10-CM | POA: Diagnosis not present

## 2022-07-09 DIAGNOSIS — Z5111 Encounter for antineoplastic chemotherapy: Secondary | ICD-10-CM | POA: Insufficient documentation

## 2022-07-09 LAB — CMP (CANCER CENTER ONLY)
ALT: 9 U/L (ref 0–44)
AST: 11 U/L — ABNORMAL LOW (ref 15–41)
Albumin: 4.1 g/dL (ref 3.5–5.0)
Alkaline Phosphatase: 98 U/L (ref 38–126)
Anion gap: 10 (ref 5–15)
BUN: 22 mg/dL (ref 8–23)
CO2: 25 mmol/L (ref 22–32)
Calcium: 9.7 mg/dL (ref 8.9–10.3)
Chloride: 107 mmol/L (ref 98–111)
Creatinine: 1.08 mg/dL (ref 0.61–1.24)
GFR, Estimated: >60 mL/min (ref >60)
Glucose, Bld: 141 mg/dL — ABNORMAL HIGH (ref 70–99)
Potassium: 3.9 mmol/L (ref 3.5–5.1)
Sodium: 142 mmol/L (ref 135–145)
Total Bilirubin: 0.6 mg/dL (ref 0.3–1.2)
Total Protein: 6.4 g/dL — ABNORMAL LOW (ref 6.5–8.1)

## 2022-07-09 LAB — CBC WITH DIFFERENTIAL (CANCER CENTER ONLY)
Abs Immature Granulocytes: 0.03 10*3/uL (ref 0.00–0.07)
Basophils Absolute: 0 10*3/uL (ref 0.0–0.1)
Basophils Relative: 0 %
Eosinophils Absolute: 0.3 10*3/uL (ref 0.0–0.5)
Eosinophils Relative: 5 %
HCT: 38.5 % — ABNORMAL LOW (ref 39.0–52.0)
Hemoglobin: 13.1 g/dL (ref 13.0–17.0)
Immature Granulocytes: 1 %
Lymphocytes Relative: 11 %
Lymphs Abs: 0.7 10*3/uL (ref 0.7–4.0)
MCH: 28.1 pg (ref 26.0–34.0)
MCHC: 34 g/dL (ref 30.0–36.0)
MCV: 82.4 fL (ref 80.0–100.0)
Monocytes Absolute: 0.4 10*3/uL (ref 0.1–1.0)
Monocytes Relative: 6 %
Neutro Abs: 4.8 10*3/uL (ref 1.7–7.7)
Neutrophils Relative %: 77 %
Platelet Count: 280 10*3/uL (ref 150–400)
RBC: 4.67 MIL/uL (ref 4.22–5.81)
RDW: 15.1 % (ref 11.5–15.5)
WBC Count: 6.2 10*3/uL (ref 4.0–10.5)
nRBC: 0 % (ref 0.0–0.2)

## 2022-07-09 LAB — CEA (ACCESS): CEA (CHCC): 206.12 ng/mL — ABNORMAL HIGH (ref 0.00–5.00)

## 2022-07-09 MED ORDER — HEPARIN SOD (PORK) LOCK FLUSH 100 UNIT/ML IV SOLN
500.0000 [IU] | Freq: Once | INTRAVENOUS | Status: AC
  Administered 2022-07-09: 500 [IU] via INTRAVENOUS

## 2022-07-09 MED ORDER — SODIUM CHLORIDE 0.9% FLUSH
10.0000 mL | INTRAVENOUS | Status: DC | PRN
  Administered 2022-07-09: 10 mL via INTRAVENOUS

## 2022-07-09 MED ORDER — INFLUENZA VAC A&B SA ADJ QUAD 0.5 ML IM PRSY
0.5000 mL | PREFILLED_SYRINGE | Freq: Once | INTRAMUSCULAR | Status: AC
  Administered 2022-07-09: 0.5 mL via INTRAMUSCULAR
  Filled 2022-07-09: qty 0.5

## 2022-07-09 NOTE — Progress Notes (Signed)
DISCONTINUE OFF PATHWAY REGIMEN - Colorectal   [id number] (Leucovorin IV D1 + Fluorouracil IV D1/CIV D1,2 + Oxaliplatin IV D1) q14 Days:   A cycle is every 14 days:     Oxaliplatin      Leucovorin      Fluorouracil      Fluorouracil   **Always confirm dose/schedule in your pharmacy ordering system**  REASON: Disease Progression PRIOR TREATMENT: Off Pathway: [id number] (Leucovorin IV D1 + Fluorouracil IV D1/CIV D1,2 + Oxaliplatin IV D1) q14 Days TREATMENT RESPONSE: Partial Response (PR)  START ON PATHWAY REGIMEN - Colorectal     A cycle is every 14 days:     Bevacizumab-xxxx      Irinotecan      Leucovorin      Fluorouracil      Fluorouracil   **Always confirm dose/schedule in your pharmacy ordering system**  Patient Characteristics: Distant Metastases, Nonsurgical Candidate, KRAS/NRAS Mutation Positive/Unknown (BRAF V600 Wild-Type/Unknown), Standard Cytotoxic Therapy, Second Line Standard Cytotoxic Therapy, Bevacizumab Eligible Tumor Location: Rectal Therapeutic Status: Distant Metastases Microsatellite/Mismatch Repair Status: MSS/pMMR BRAF Mutation Status: Wild-Type (no mutation) KRAS/NRAS Mutation Status: Mutation Positive Preferred Therapy Approach: Standard Cytotoxic Therapy Standard Cytotoxic Line of Therapy: Second Line Standard Cytotoxic Therapy Bevacizumab Eligibility: Eligible Intent of Therapy: Non-Curative / Palliative Intent, Discussed with Patient

## 2022-07-09 NOTE — Progress Notes (Signed)
Disease progression. Will not receive chemo today.

## 2022-07-09 NOTE — Progress Notes (Signed)
[facility] OFFICE PROGRESS NOTE   Diagnosis: Colon cancer  INTERVAL HISTORY:   Mr Schwinn completed another cycle of 5-fluorouracil on 06/18/2022.  He reports increased malaise and constipation.  He has a painful hemorrhoid.  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  Objective:  Vital signs in last 24 hours:  Blood pressure (!) 150/78, pulse 78, temperature 98.1 F (36.7 C), temperature source Oral, resp. rate 18, height _0  (1.753 m), weight 160 lb 3.2 oz (72.7 kg), SpO2 100 %.    HEENT: No thrush or ulcers Resp: Lungs clear bilaterally Cardio: Regular rate and rhythm GI: No hepatosplenomegaly Vascular: No leg edema Rectal: External exam reveals soft hemorrhoids at the anal verge, 1 appears inflamed, but not thrombosed  Portacath/PICC-without erythema  Lab Results:  Lab Results  Component Value Date   WBC 6.2 07/09/2022   HGB 13.1 07/09/2022   HCT 38.5 (L) 07/09/2022   MCV 82.4 07/09/2022   PLT 280 07/09/2022   NEUTROABS 4.8 07/09/2022    CMP  Lab Results  Component Value Date   NA 142 06/18/2022   K 4.1 06/18/2022   CL 106 06/18/2022   CO2 26 06/18/2022   GLUCOSE 100 (H) 06/18/2022   BUN 17 06/18/2022   CREATININE 1.00 06/18/2022   CALCIUM 9.4 06/18/2022   PROT 6.5 06/18/2022   ALBUMIN 4.1 06/18/2022   AST 10 (L) 06/18/2022   ALT 7 06/18/2022   ALKPHOS 89 06/18/2022   BILITOT 0.6 06/18/2022   GFRNONAA >60 06/18/2022    Lab Results  Component Value Date   CEA 206.12 (H) 07/09/2022     Medications: I have reviewed the patient's current medications.   Assessment/Plan:  Rectal cancer MRI abdomen 04/26/2019 2-3 new hypervascular masses in the liver dome, no abdominal lymphadenopathy, stable benign left adrenal adenoma, stable right lower pole kidney mass Ultrasound-guided biopsy of a liver lesion 05/07/2021-metastatic adenocarcinoma with extensive necrosis, immunohistochemical profile consistent with a colorectal  primary; foundation 1-microsatellite stable, tumor mutation burden 5, K-rasG 12V, NRAS wildtype Colonoscopy 05/22/2021-ulcerated partially obstructing mass at 15 cm from anal verge CTs 05/30/2021-numerous small pulmonary nodules concerning for metastases, thickening of the rectum, multiple liver metastases Cycle 1 FOLFOX 06/12/2021 Cycle 2 FOLFOX 06/26/2021 Cycle 3 FOLFOX 07/10/2021, oxaliplatin dose reduced due to progressive decline in the [place] and platelet count Cycle 4 FOLFOX 07/24/2021 Cycle 5 FOLFOX 08/07/2021, 5-FU bolus eliminated and oxaliplatin dose reduced, insurance would not approve Udenyca CTs 08/15/2021-decrease size of lung nodules, decreased hepatic metastases, persistent anorectal wall thickening, stable right renal mass Cycle 6 FOLFOX 08/21/2021, 5-FU bolus and 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 Cycle 7 FOLFOX 09/04/2021, 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 Cycle 8 FOLFOX 09/18/2021, 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 Cycle 9 FOLFOX 10/08/2021, 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 Cycle 10 FOLFOX 10/23/2021, 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 CTs 11/01/2021-decrease in size of occasional small pulmonary nodules, continued decrease in size of multiple hypoenhancing hepatic metastases, unchanged posttreatment appearance of the low rectum, unchanged exophytic mass of the inferior pole of the right kidney measuring 1.5 x 1.3 cm Cycle 11 5-fluorouracil 11/06/2021 Cycle 12 5-fluorouracil 11/20/2021 Cycle 13 5-fluorouracil 12/04/2021 Maintenance Xeloda beginning 12/19/2021 Xeloda dose reduced to 1000 mg twice daily beginning 12/27/2021 Xeloda placed on hold 01/16/2022 CTs 01/22/2022-segment of distal ileum with circumferential wall thickening and perienteric inflammation, decrease in size of liver metastases, stable tiny bilateral pulmonary nodules, circumferential wall thickening in the rectum, stable lower pole right  kidney  lesion suspicious for renal cell carcinoma Xeloda discontinued due to diarrhea 5-fluorouracil pump 02/05/2022 5-fluorouracil pump 02/26/2022 Treatment held 03/18/2022 due to diarrhea 5-fluorouracil pump 03/25/2022, dose reduced due to diarrhea 5-fluorouracil pump 04/16/2022 CTs 05/03/2022-unchanged tiny pulmonary nodules, unchanged liver metastases, stable appearance of the low rectal wall thickening, unchanged posterior right renal mass 5-fluorouracil pump 05/07/2022 5-fluorouracil pump 05/28/2022 5-fluorouracil pump 06/18/2022 CTs 07/04/2022-increased size of liver metastases, no new lesions, new 3 mm right lower lobe nodule-nonspecific Prostate cancer-simple prostatectomy January 2019, Gleason 3+3, 10 to 12% of specimen Active surveillance, biopsy May 2019 with small focus of Gleason 3+3 disease in 1/6 cores, surveillance continued Elevated PSA 04/20/2020 Biopsy 06/19/2020 8/12 cores positive, Gleason 4+4 PET scan 07/07/2020-negative CT 07/07/2020 right iliac and retrocaval adenopathy, 1.3 cm subcapsular right lower pole renal lesion Androgen deprivation therapy beginning 08/21/2020 Radiation to prostate, seminal vesicles, and pelvic lymph nodes to 10/22 - 12/28/2020 He continues every 18-monthFirmagon and daily Xtandi   3.  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  Mitral valve prolapse 5.  Family history of prostate cancer 6.  08/15/2021 with leg weakness and an episode of slurred speech-TIA?,  Brain imaging negative 7.  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  He has been on maintenance Xeloda and 5-FU since December of last year.  The CEA is higher and the restaging CTs are consistent with disease progression in the liver.  I reviewed the CT findings and images with Mr. RBentson   I discussed treatment options  with Mr. RSherrowincluding supportive care versus a trial of salvage systemic therapy.  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  I recommend FOLFIRI/bevacizumab.  We reviewed potential toxicities associated with irinotecan including the chance of nausea/vomiting, mucositis, diarrhea, and alopecia.  We discussed the allergic reaction, hypertension, bleeding, thromboembolic disease, bowel perforation, nephrotoxicity, and CNS toxicity associated with bevacizumab.  He agrees to proceed.  The plan is to begin FOLFIRI/bevacizumab next week.  A chemotherapy plan was entered today.  He will use hemorrhoid cream and sitz bath's for the inflamed hemorrhoid.  GBetsy Coder MD  07/09/2022  10:46 AM

## 2022-07-10 ENCOUNTER — Other Ambulatory Visit: Payer: Self-pay

## 2022-07-11 ENCOUNTER — Inpatient Hospital Stay: Payer: Medicare Other

## 2022-07-14 ENCOUNTER — Other Ambulatory Visit: Payer: Self-pay | Admitting: Oncology

## 2022-07-15 ENCOUNTER — Other Ambulatory Visit: Payer: Self-pay

## 2022-07-16 ENCOUNTER — Inpatient Hospital Stay: Payer: Medicare Other

## 2022-07-16 VITALS — BP 155/71 | HR 78 | Temp 98.2°F | Resp 20 | Ht 69.0 in | Wt 161.1 lb

## 2022-07-16 DIAGNOSIS — C20 Malignant neoplasm of rectum: Secondary | ICD-10-CM

## 2022-07-16 DIAGNOSIS — Z5111 Encounter for antineoplastic chemotherapy: Secondary | ICD-10-CM | POA: Diagnosis not present

## 2022-07-16 MED ORDER — PALONOSETRON HCL INJECTION 0.25 MG/5ML
0.2500 mg | Freq: Once | INTRAVENOUS | Status: AC
  Administered 2022-07-16: 0.25 mg via INTRAVENOUS
  Filled 2022-07-16: qty 5

## 2022-07-16 MED ORDER — FLUOROURACIL CHEMO INJECTION 2.5 GM/50ML
400.0000 mg/m2 | Freq: Once | INTRAVENOUS | Status: AC
  Administered 2022-07-16: 750 mg via INTRAVENOUS
  Filled 2022-07-16: qty 15

## 2022-07-16 MED ORDER — SODIUM CHLORIDE 0.9 % IV SOLN
1600.0000 mg/m2 | INTRAVENOUS | Status: DC
  Administered 2022-07-16: 3000 mg via INTRAVENOUS
  Filled 2022-07-16: qty 60

## 2022-07-16 MED ORDER — SODIUM CHLORIDE 0.9 % IV SOLN: Freq: Once | INTRAVENOUS | Status: AC

## 2022-07-16 MED ORDER — SODIUM CHLORIDE 0.9 % IV SOLN
10.0000 mg | Freq: Once | INTRAVENOUS | Status: AC
  Administered 2022-07-16: 10 mg via INTRAVENOUS
  Filled 2022-07-16: qty 10

## 2022-07-16 MED ORDER — SODIUM CHLORIDE 0.9 % IV SOLN
145.0000 mg/m2 | Freq: Once | INTRAVENOUS | Status: AC
  Administered 2022-07-16: 280 mg via INTRAVENOUS
  Filled 2022-07-16: qty 4

## 2022-07-16 MED ORDER — ATROPINE SULFATE 1 MG/ML IV SOLN
0.5000 mg | Freq: Once | INTRAVENOUS | Status: DC | PRN
  Filled 2022-07-16: qty 1

## 2022-07-16 MED ORDER — SODIUM CHLORIDE 0.9 % IV SOLN
400.0000 mg/m2 | Freq: Once | INTRAVENOUS | Status: AC
  Administered 2022-07-16: 752 mg via INTRAVENOUS
  Filled 2022-07-16: qty 37.6

## 2022-07-16 MED ORDER — SODIUM CHLORIDE 0.9 % IV SOLN
5.0000 mg/kg | Freq: Once | INTRAVENOUS | Status: AC
  Administered 2022-07-16: 400 mg via INTRAVENOUS
  Filled 2022-07-16: qty 16

## 2022-07-16 NOTE — Progress Notes (Signed)
Per Dr. [name]: OK to treat today based on labs on 07/09/22. No need for UA protein today. Will check at next treatment.

## 2022-07-16 NOTE — Patient Instructions (Addendum)
[facility]   Discharge Instructions: Thank you for choosing [facility] to provide your oncology and hematology care.   If you have a lab appointment with the [facility], please go directly to the [facility] and check in at the registration area.   Wear comfortable clothing and clothing appropriate for easy access to any Portacath or PICC line.   We strive to give you quality time with your provider. You may need to reschedule your appointment if you arrive late (15 or more minutes).  Arriving late affects you and other patients whose appointments are after yours.  Also, if you miss three or more appointments without notifying the office, you may be dismissed from the clinic at the provider's discretion.      For prescription refill requests, have your pharmacy contact our office and allow 72 hours for refills to be completed.    Today you received the following chemotherapy and/or immunotherapy agents Bevacizumab-adcd (VEGZELMA), Irinotecan (CAMPTOSAR), Leucovorin & Flourouracil (ADRUCIL).      To help prevent nausea and vomiting after your treatment, we encourage you to take your nausea medication as directed.  BELOW ARE SYMPTOMS THAT SHOULD BE REPORTED IMMEDIATELY: *FEVER GREATER THAN 100.4 F (38 C) OR HIGHER *CHILLS OR SWEATING *NAUSEA AND VOMITING THAT IS NOT CONTROLLED WITH YOUR NAUSEA MEDICATION *UNUSUAL SHORTNESS OF BREATH *UNUSUAL BRUISING OR BLEEDING *URINARY PROBLEMS (pain or burning when urinating, or frequent urination) *BOWEL PROBLEMS (unusual diarrhea, constipation, pain near the anus) TENDERNESS IN MOUTH AND THROAT WITH OR WITHOUT PRESENCE OF ULCERS (sore throat, sores in mouth, or a toothache) UNUSUAL RASH, SWELLING OR PAIN  UNUSUAL VAGINAL DISCHARGE OR ITCHING   Items with * indicate a potential emergency and should be followed up as soon as possible or go to the Emergency Department if any problems should  occur.  Please show the CHEMOTHERAPY ALERT CARD or IMMUNOTHERAPY ALERT CARD at check-in to the Emergency Department and triage nurse.  Should you have questions after your visit or need to cancel or reschedule your appointment, please contact [facility]  Dept: [phone]  and follow the prompts.  Office hours are 8:00 a.m. to 4:30 p.m. Monday - Friday. Please note that voicemails left after 4:00 p.m. may not be returned until the following business day.  We are closed weekends and major holidays. You have access to a nurse at all times for urgent questions. Please call the main number to the clinic Dept: [phone] and follow the prompts.   For any non-urgent questions, you may also contact your provider using MyChart. We now offer e-Visits for anyone 65 and older to request care online for non-urgent symptoms. For details visit [email or web address].   Also download the MyChart app! Go to the app store, search "MyChart", open the app, select [facility], and log in with your MyChart username and password.  Masks are optional in the cancer centers. If you would like for your care team to wear a mask while they are taking care of you, please let them know. You may have one support person who is at least 82 years old accompany you for your appointments.  Bevacizumab Injection What is this medication? BEVACIZUMAB (be va SIZ yoo mab) treats some types of cancer. It works by blocking a protein that causes cancer cells to grow and multiply. This helps to slow or stop the spread of cancer cells. It is a monoclonal antibody. This medicine may be used for  other purposes; ask your health care provider or pharmacist if you have questions. COMMON BRAND NAME(S): Alymsys, Avastin, MVASI, Noah Charon What should I tell my care team before I take this medication? They need to know if you have any of these conditions: Blood clots Coughing up blood Having or recent surgery Heart  failure High blood pressure History of a connection between 2 or more body parts that do not usually connect (fistula) History of a tear in your stomach or intestines Protein in your urine An unusual or allergic reaction to bevacizumab, other medications, foods, dyes, or preservatives Pregnant or trying to get pregnant Breast-feeding How should I use this medication? This medication is injected into a vein. It is given by your care team in a hospital or clinic setting. Talk to your care team the use of this medication in children. Special care may be needed. Overdosage: If you think you have taken too much of this medicine contact a poison control center or emergency room at once. NOTE: This medicine is only for you. Do not share this medicine with others. What if I miss a dose? Keep appointments for follow-up doses. It is important not to miss your dose. Call your care team if you are unable to keep an appointment. What may interact with this medication? Interactions are not expected. This list may not describe all possible interactions. Give your health care provider a list of all the medicines, herbs, non-prescription drugs, or dietary supplements you use. Also tell them if you smoke, drink alcohol, or use illegal drugs. Some items may interact with your medicine. What should I watch for while using this medication? Your condition will be monitored carefully while you are receiving this medication. You may need blood work while taking this medication. This medication may make you feel generally unwell. This is not uncommon as chemotherapy can affect healthy cells as well as cancer cells. Report any side effects. Continue your course of treatment even though you feel ill unless your care team tells you to stop. This medication may increase your risk to bruise or bleed. Call your care team if you notice any unusual bleeding. Before having surgery, talk to your care team to make sure it is ok.  This medication can increase the risk of poor healing of your surgical site or wound. You will need to stop this medication for 28 days before surgery. After surgery, wait at least 28 days before restarting this medication. Make sure the surgical site or wound is healed enough before restarting this medication. Talk to your care team if questions. Talk to your care team if you may be pregnant. Serious birth defects can occur if you take this medication during pregnancy and for 6 months after the last dose. Contraception is recommended while taking this medication and for 6 months after the last dose. Your care team can help you find the option that works for you. Do not breastfeed while taking this medication and for 6 months after the last dose. This medication can cause infertility. Talk to your care team if you are concerned about your fertility. What side effects may I notice from receiving this medication? Side effects that you should report to your care team as soon as possible: Allergic reactions--skin rash, itching, hives, swelling of the face, lips, tongue, or throat Bleeding--bloody or black, tar-like stools, vomiting blood or brown material that looks like coffee grounds, red or dark brown urine, small red or purple spots on skin, unusual bruising or bleeding  Blood clot--pain, swelling, or warmth in the leg, shortness of breath, chest pain Heart attack--pain or tightness in the chest, shoulders, arms, or jaw, nausea, shortness of breath, cold or clammy skin, feeling faint or lightheaded Heart failure--shortness of breath, swelling of the ankles, feet, or hands, sudden weight gain, unusual weakness or fatigue Increase in blood pressure Infection--fever, chills, cough, sore throat, wounds that don't heal, pain or trouble when passing urine, general feeling of discomfort or being unwell Infusion reactions--chest pain, shortness of breath or trouble breathing, feeling faint or lightheaded Kidney  injury--decrease in the amount of urine, swelling of the ankles, hands, or feet Stomach pain that is severe, does not go away, or gets worse Stroke--sudden numbness or weakness of the face, arm, or leg, trouble speaking, confusion, trouble walking, loss of balance or coordination, dizziness, severe headache, change in vision Sudden and severe headache, confusion, change in vision, seizures, which may be signs of posterior reversible encephalopathy syndrome (PRES) Side effects that usually do not require medical attention (report to your care team if they continue or are bothersome): Back pain Change in taste Diarrhea Dry skin Increased tears Nosebleed This list may not describe all possible side effects. Call your doctor for medical advice about side effects. You may report side effects to FDA at 1-800-FDA-1088. Where should I keep my medication? This medication is given in a hospital or clinic. It will not be stored at home. NOTE: This sheet is a summary. It may not cover all possible information. If you have questions about this medicine, talk to your doctor, pharmacist, or health care provider.  2023 Elsevier/Gold Standard (2022-01-22 00:00:00)  Irinotecan Injection What is this medication? IRINOTECAN (ir in oh TEE kan) treats some types of cancer. It works by slowing down the growth of cancer cells. This medicine may be used for other purposes; ask your health care provider or pharmacist if you have questions. COMMON BRAND NAME(S): Camptosar What should I tell my care team before I take this medication? They need to know if you have any of these conditions: Dehydration Diarrhea Infection, especially a viral infection, such as chickenpox, cold sores, herpes Liver disease Low blood cell levels (white cells, red cells, and platelets) Low levels of electrolytes, such as calcium, magnesium, or potassium in your blood Recent or ongoing radiation An unusual or allergic reaction to  irinotecan, other medications, foods, dyes, or preservatives If you or your partner are pregnant or trying to get pregnant Breast-feeding How should I use this medication? This medication is injected into a vein. It is given by your care team in a hospital or clinic setting. Talk to your care team about the use of this medication in children. Special care may be needed. Overdosage: If you think you have taken too much of this medicine contact a poison control center or emergency room at once. NOTE: This medicine is only for you. Do not share this medicine with others. What if I miss a dose? Keep appointments for follow-up doses. It is important not to miss your dose. Call your care team if you are unable to keep an appointment. What may interact with this medication? Do not take this medication with any of the following: Cobicistat Itraconazole This medication may also interact with the following: Certain antibiotics, such as clarithromycin, rifampin, rifabutin Certain antivirals for HIV or AIDS Certain medications for fungal infections, such as ketoconazole, posaconazole, voriconazole Certain medications for seizures, such as carbamazepine, phenobarbital, phenytoin Gemfibrozil Nefazodone St. John's wort This list may  not describe all possible interactions. Give your health care provider a list of all the medicines, herbs, non-prescription drugs, or dietary supplements you use. Also tell them if you smoke, drink alcohol, or use illegal drugs. Some items may interact with your medicine. What should I watch for while using this medication? Your condition will be monitored carefully while you are receiving this medication. You may need blood work while taking this medication. This medication may make you feel generally unwell. This is not uncommon as chemotherapy can affect healthy cells as well as cancer cells. Report any side effects. Continue your course of treatment even though you feel ill  unless your care team tells you to stop. This medication can cause serious side effects. To reduce the risk, your care team may give you other medications to take before receiving this one. Be sure to follow the directions from your care team. This medication may affect your coordination, reaction time, or judgement. Do not drive or operate machinery until you know how this medication affects you. Sit up or stand slowly to reduce the risk of dizzy or fainting spells. Drinking alcohol with this medication can increase the risk of these side effects. This medication may increase your risk of getting an infection. Call your care team for advice if you get a fever, chills, sore throat, or other symptoms of a cold or flu. Do not treat yourself. Try to avoid being around people who are sick. Avoid taking medications that contain aspirin, acetaminophen, ibuprofen, naproxen, or ketoprofen unless instructed by your care team. These medications may hide a fever. This medication may increase your risk to bruise or bleed. Call your care team if you notice any unusual bleeding. Be careful brushing or flossing your teeth or using a toothpick because you may get an infection or bleed more easily. If you have any dental work done, tell your dentist you are receiving this medication. Talk to your care team if you or your partner are pregnant or think either of you might be pregnant. This medication can cause serious birth defects if taken during pregnancy and for 6 months after the last dose. You will need a negative pregnancy test before starting this medication. Contraception is recommended while taking this medication and for 6 months after the last dose. Your care team can help you find the option that works for you. Do not father a child while taking this medication and for 3 months after the last dose. Use a condom for contraception during this time period. Do not breastfeed while taking this medication and for 7 days  after the last dose. This medication may cause infertility. Talk to your care team if you are concerned about your fertility. What side effects may I notice from receiving this medication? Side effects that you should report to your care team as soon as possible: Allergic reactions--skin rash, itching, hives, swelling of the face, lips, tongue, or throat Dry cough, shortness of breath or trouble breathing Increased saliva or tears, increased sweating, stomach cramping, diarrhea, small pupils, unusual weakness or fatigue, slow heartbeat Infection--fever, chills, cough, sore throat, wounds that don't heal, pain or trouble when passing urine, general feeling of discomfort or being unwell Kidney injury--decrease in the amount of urine, swelling of the ankles, hands, or feet Low red blood cell level--unusual weakness or fatigue, dizziness, headache, trouble breathing Severe or prolonged diarrhea Unusual bruising or bleeding Side effects that usually do not require medical attention (report to your care team if they  continue or are bothersome): Constipation Diarrhea Hair loss Loss of appetite Nausea Stomach pain This list may not describe all possible side effects. Call your doctor for medical advice about side effects. You may report side effects to FDA at 1-800-FDA-1088. Where should I keep my medication? This medication is given in a hospital or clinic. It will not be stored at home. NOTE: This sheet is a summary. It may not cover all possible information. If you have questions about this medicine, talk to your doctor, pharmacist, or health care provider.  2023 Elsevier/Gold Standard (2022-01-17 00:00:00)  Leucovorin Injection What is this medication? LEUCOVORIN (loo koe VOR in) prevents side effects from certain medications, such as methotrexate. It works by increasing folate levels. This helps protect healthy cells in your body. It may also be used to treat anemia caused by low levels of  folate. It can also be used with fluorouracil, a type of chemotherapy, to treat colorectal cancer. It works by increasing the effects of fluorouracil in the body. This medicine may be used for other purposes; ask your health care provider or pharmacist if you have questions. What should I tell my care team before I take this medication? They need to know if you have any of these conditions: Anemia from low levels of vitamin B12 in the blood An unusual or allergic reaction to leucovorin, folic acid, other medications, foods, dyes, or preservatives Pregnant or trying to get pregnant Breastfeeding How should I use this medication? This medication is injected into a vein or a muscle. It is given by your care team in a hospital or clinic setting. Talk to your care team about the use of this medication in children. Special care may be needed. Overdosage: If you think you have taken too much of this medicine contact a poison control center or emergency room at once. NOTE: This medicine is only for you. Do not share this medicine with others. What if I miss a dose? Keep appointments for follow-up doses. It is important not to miss your dose. Call your care team if you are unable to keep an appointment. What may interact with this medication? Capecitabine Fluorouracil Phenobarbital Phenytoin Primidone Trimethoprim;sulfamethoxazole This list may not describe all possible interactions. Give your health care provider a list of all the medicines, herbs, non-prescription drugs, or dietary supplements you use. Also tell them if you smoke, drink alcohol, or use illegal drugs. Some items may interact with your medicine. What should I watch for while using this medication? Your condition will be monitored carefully while you are receiving this medication. This medication may increase the side effects of 5-fluorouracil. Tell your care team if you have diarrhea or mouth sores that do not get better or that get  worse. What side effects may I notice from receiving this medication? Side effects that you should report to your care team as soon as possible: Allergic reactions--skin rash, itching, hives, swelling of the face, lips, tongue, or throat This list may not describe all possible side effects. Call your doctor for medical advice about side effects. You may report side effects to FDA at 1-800-FDA-1088. Where should I keep my medication? This medication is given in a hospital or clinic. It will not be stored at home. NOTE: This sheet is a summary. It may not cover all possible information. If you have questions about this medicine, talk to your doctor, pharmacist, or health care provider.  2023 Elsevier/Gold Standard (2022-01-18 00:00:00)  Fluorouracil Injection What is this medication? FLUOROURACIL (  flure oh YOOR a sil) treats some types of cancer. It works by slowing down the growth of cancer cells. This medicine may be used for other purposes; ask your health care provider or pharmacist if you have questions. COMMON BRAND NAME(S): Adrucil What should I tell my care team before I take this medication? They need to know if you have any of these conditions: Blood disorders Dihydropyrimidine dehydrogenase (DPD) deficiency Infection, such as chickenpox, cold sores, herpes Kidney disease Liver disease Poor nutrition Recent or ongoing radiation therapy An unusual or allergic reaction to fluorouracil, other medications, foods, dyes, or preservatives If you or your partner are pregnant or trying to get pregnant Breast-feeding How should I use this medication? This medication is injected into a vein. It is administered by your care team in a hospital or clinic setting. Talk to your care team about the use of this medication in children. Special care may be needed. Overdosage: If you think you have taken too much of this medicine contact a poison control center or emergency room at once. NOTE:  This medicine is only for you. Do not share this medicine with others. What if I miss a dose? Keep appointments for follow-up doses. It is important not to miss your dose. Call your care team if you are unable to keep an appointment. What may interact with this medication? Do not take this medication with any of the following: Live virus vaccines This medication may also interact with the following: Medications that treat or prevent blood clots, such as warfarin, enoxaparin, dalteparin This list may not describe all possible interactions. Give your health care provider a list of all the medicines, herbs, non-prescription drugs, or dietary supplements you use. Also tell them if you smoke, drink alcohol, or use illegal drugs. Some items may interact with your medicine. What should I watch for while using this medication? Your condition will be monitored carefully while you are receiving this medication. This medication may make you feel generally unwell. This is not uncommon as chemotherapy can affect healthy cells as well as cancer cells. Report any side effects. Continue your course of treatment even though you feel ill unless your care team tells you to stop. In some cases, you may be given additional medications to help with side effects. Follow all directions for their use. This medication may increase your risk of getting an infection. Call your care team for advice if you get a fever, chills, sore throat, or other symptoms of a cold or flu. Do not treat yourself. Try to avoid being around people who are sick. This medication may increase your risk to bruise or bleed. Call your care team if you notice any unusual bleeding. Be careful brushing or flossing your teeth or using a toothpick because you may get an infection or bleed more easily. If you have any dental work done, tell your dentist you are receiving this medication. Avoid taking medications that contain aspirin, acetaminophen, ibuprofen,  naproxen, or ketoprofen unless instructed by your care team. These medications may hide a fever. Do not treat diarrhea with over the counter products. Contact your care team if you have diarrhea that lasts more than 2 days or if it is severe and watery. This medication can make you more sensitive to the sun. Keep out of the sun. If you cannot avoid being in the sun, wear protective clothing and sunscreen. Do not use sun lamps, tanning beds, or tanning booths. Talk to your care team if you or  your partner wish to become pregnant or think you might be pregnant. This medication can cause serious birth defects if taken during pregnancy and for 3 months after the last dose. A reliable form of contraception is recommended while taking this medication and for 3 months after the last dose. Talk to your care team about effective forms of contraception. Do not father a child while taking this medication and for 3 months after the last dose. Use a condom while having sex during this time period. Do not breastfeed while taking this medication. This medication may cause infertility. Talk to your care team if you are concerned about your fertility. What side effects may I notice from receiving this medication? Side effects that you should report to your care team as soon as possible: Allergic reactions--skin rash, itching, hives, swelling of the face, lips, tongue, or throat Heart attack--pain or tightness in the chest, shoulders, arms, or jaw, nausea, shortness of breath, cold or clammy skin, feeling faint or lightheaded Heart failure--shortness of breath, swelling of the ankles, feet, or hands, sudden weight gain, unusual weakness or fatigue Heart rhythm changes--fast or irregular heartbeat, dizziness, feeling faint or lightheaded, chest pain, trouble breathing High ammonia level--unusual weakness or fatigue, confusion, loss of appetite, nausea, vomiting, seizures Infection--fever, chills, cough, sore throat,  wounds that don't heal, pain or trouble when passing urine, general feeling of discomfort or being unwell Low red blood cell level--unusual weakness or fatigue, dizziness, headache, trouble breathing Pain, tingling, or numbness in the hands or feet, muscle weakness, change in vision, confusion or trouble speaking, loss of balance or coordination, trouble walking, seizures Redness, swelling, and blistering of the skin over hands and feet Severe or prolonged diarrhea Unusual bruising or bleeding Side effects that usually do not require medical attention (report to your care team if they continue or are bothersome): Dry skin Headache Increased tears Nausea Pain, redness, or swelling with sores inside the mouth or throat Sensitivity to light Vomiting This list may not describe all possible side effects. Call your doctor for medical advice about side effects. You may report side effects to FDA at 1-800-FDA-1088. Where should I keep my medication? This medication is given in a hospital or clinic. It will not be stored at home. NOTE: This sheet is a summary. It may not cover all possible information. If you have questions about this medicine, talk to your doctor, pharmacist, or health care provider.  2023 Elsevier/Gold Standard (2022-01-15 00:00:00)  The chemotherapy medication bag should finish at 46 hours, 96 hours, or 7 days. For example, if your pump is scheduled for 46 hours and it was put on at 4:00 p.m., it should finish at 2:00 p.m. the day it is scheduled to come off regardless of your appointment time.     Estimated time to finish at 12:45 p.m. on Thursday 07/18/2022.   If the display on your pump reads "Low Volume" and it is beeping, take the batteries out of the pump and come to the cancer center for it to be taken off.   If the pump alarms go off prior to the pump reading "Low Volume" then call [phone] and someone can assist you.  If the plunger comes out and the  chemotherapy medication is leaking out, please use your home chemo spill kit to clean up the spill. Do NOT use paper towels or other household products.  If you have problems or questions regarding your pump, please call either [phone] (24 hours a day) or the  cancer center Monday-Friday 8:00 a.m.- 4:30 p.m. at the clinic number and we will assist you. If you are unable to get assistance, then go to the nearest Emergency Department and ask the staff to contact the IV team for assistance.

## 2022-07-17 ENCOUNTER — Telehealth: Payer: Self-pay

## 2022-07-17 NOTE — Telephone Encounter (Signed)
24 Hour Call Back  Follow up telephone call to patient post first time Bevacizumab/Folfiri, unable to reach patient. Left voice message. Patient is scheduled for pump stop tomorrow 07/18/2022 at 1245.

## 2022-07-18 ENCOUNTER — Inpatient Hospital Stay: Payer: Medicare Other

## 2022-07-18 VITALS — BP 154/73 | HR 62 | Resp 20

## 2022-07-18 DIAGNOSIS — Z5111 Encounter for antineoplastic chemotherapy: Secondary | ICD-10-CM | POA: Diagnosis not present

## 2022-07-18 DIAGNOSIS — C20 Malignant neoplasm of rectum: Secondary | ICD-10-CM

## 2022-07-18 MED ORDER — SODIUM CHLORIDE 0.9% FLUSH
10.0000 mL | INTRAVENOUS | Status: DC | PRN
  Administered 2022-07-18: 10 mL

## 2022-07-18 MED ORDER — HEPARIN SOD (PORK) LOCK FLUSH 100 UNIT/ML IV SOLN
500.0000 [IU] | Freq: Once | INTRAVENOUS | Status: AC | PRN
  Administered 2022-07-18: 500 [IU]

## 2022-07-18 NOTE — Patient Instructions (Signed)

## 2022-07-25 ENCOUNTER — Telehealth: Payer: Self-pay

## 2022-07-25 NOTE — Telephone Encounter (Signed)
Neil Perry reported cramping 1.5 inches below his navel, fatigue, no appetite, and diarrhea. Patient denied any fever or shortness of breath. He use a heating pad for the cramping. Per Dr. [name] he can come on 11/3  for a o/v and he can take imodium for the diarrhea.  Mr Harriman stated he is fine now. If the cramping come back he will call to schedule to come.

## 2022-07-28 ENCOUNTER — Other Ambulatory Visit: Payer: Self-pay | Admitting: Oncology

## 2022-07-30 ENCOUNTER — Inpatient Hospital Stay: Payer: Medicare Other | Attending: Nurse Practitioner

## 2022-07-30 ENCOUNTER — Inpatient Hospital Stay: Payer: Medicare Other

## 2022-07-30 ENCOUNTER — Encounter: Payer: Self-pay | Admitting: Nurse Practitioner

## 2022-07-30 ENCOUNTER — Inpatient Hospital Stay (HOSPITAL_BASED_OUTPATIENT_CLINIC_OR_DEPARTMENT_OTHER): Payer: Medicare Other | Admitting: Nurse Practitioner

## 2022-07-30 VITALS — BP 132/80 | HR 77 | Temp 98.2°F | Resp 18 | Wt 154.6 lb

## 2022-07-30 DIAGNOSIS — R197 Diarrhea, unspecified: Secondary | ICD-10-CM | POA: Diagnosis not present

## 2022-07-30 DIAGNOSIS — Z5111 Encounter for antineoplastic chemotherapy: Secondary | ICD-10-CM | POA: Diagnosis present

## 2022-07-30 DIAGNOSIS — N2889 Other specified disorders of kidney and ureter: Secondary | ICD-10-CM | POA: Insufficient documentation

## 2022-07-30 DIAGNOSIS — I341 Nonrheumatic mitral (valve) prolapse: Secondary | ICD-10-CM | POA: Insufficient documentation

## 2022-07-30 DIAGNOSIS — C787 Secondary malignant neoplasm of liver and intrahepatic bile duct: Secondary | ICD-10-CM | POA: Insufficient documentation

## 2022-07-30 DIAGNOSIS — C20 Malignant neoplasm of rectum: Secondary | ICD-10-CM

## 2022-07-30 DIAGNOSIS — R5383 Other fatigue: Secondary | ICD-10-CM | POA: Insufficient documentation

## 2022-07-30 DIAGNOSIS — Z95828 Presence of other vascular implants and grafts: Secondary | ICD-10-CM

## 2022-07-30 LAB — CMP (CANCER CENTER ONLY)
ALT: 9 U/L (ref 0–44)
AST: 12 U/L — ABNORMAL LOW (ref 15–41)
Albumin: 3.8 g/dL (ref 3.5–5.0)
Alkaline Phosphatase: 82 U/L (ref 38–126)
Anion gap: 9 (ref 5–15)
BUN: 12 mg/dL (ref 8–23)
CO2: 24 mmol/L (ref 22–32)
Calcium: 9.3 mg/dL (ref 8.9–10.3)
Chloride: 109 mmol/L (ref 98–111)
Creatinine: 1.1 mg/dL (ref 0.61–1.24)
GFR, Estimated: >60 mL/min (ref >60)
Glucose, Bld: 140 mg/dL — ABNORMAL HIGH (ref 70–99)
Potassium: 3.5 mmol/L (ref 3.5–5.1)
Sodium: 142 mmol/L (ref 135–145)
Total Bilirubin: 0.6 mg/dL (ref 0.3–1.2)
Total Protein: 6.2 g/dL — ABNORMAL LOW (ref 6.5–8.1)

## 2022-07-30 LAB — CBC WITH DIFFERENTIAL (CANCER CENTER ONLY)
Abs Immature Granulocytes: 0.01 10*3/uL (ref 0.00–0.07)
Basophils Absolute: 0 10*3/uL (ref 0.0–0.1)
Basophils Relative: 1 %
Eosinophils Absolute: 0.2 10*3/uL (ref 0.0–0.5)
Eosinophils Relative: 4 %
HCT: 36.6 % — ABNORMAL LOW (ref 39.0–52.0)
Hemoglobin: 12.7 g/dL — ABNORMAL LOW (ref 13.0–17.0)
Immature Granulocytes: 0 %
Lymphocytes Relative: 15 %
Lymphs Abs: 0.6 10*3/uL — ABNORMAL LOW (ref 0.7–4.0)
MCH: 28 pg (ref 26.0–34.0)
MCHC: 34.7 g/dL (ref 30.0–36.0)
MCV: 80.6 fL (ref 80.0–100.0)
Monocytes Absolute: 0.3 10*3/uL (ref 0.1–1.0)
Monocytes Relative: 6 %
Neutro Abs: 3.2 10*3/uL (ref 1.7–7.7)
Neutrophils Relative %: 74 %
Platelet Count: 264 10*3/uL (ref 150–400)
RBC: 4.54 MIL/uL (ref 4.22–5.81)
RDW: 14.7 % (ref 11.5–15.5)
WBC Count: 4.2 10*3/uL (ref 4.0–10.5)
nRBC: 0 % (ref 0.0–0.2)

## 2022-07-30 MED ORDER — HEPARIN SOD (PORK) LOCK FLUSH 100 UNIT/ML IV SOLN
500.0000 [IU] | Freq: Once | INTRAVENOUS | Status: AC
  Administered 2022-07-30: 500 [IU] via INTRAVENOUS

## 2022-07-30 MED ORDER — SODIUM CHLORIDE 0.9% FLUSH
10.0000 mL | INTRAVENOUS | Status: AC | PRN
  Administered 2022-07-30: 10 mL via INTRAVENOUS

## 2022-07-30 MED ORDER — DIPHENOXYLATE-ATROPINE 2.5-0.025 MG PO TABS: 1.0000 | ORAL_TABLET | Freq: Four times a day (QID) | ORAL | 0 refills | Status: DC | PRN

## 2022-07-30 NOTE — Patient Instructions (Signed)

## 2022-07-30 NOTE — Progress Notes (Signed)
[place] OFFICE PROGRESS NOTE   Diagnosis: Colon cancer  INTERVAL HISTORY:   Mr. Allbritton returns as scheduled.  He completed cycle 1 FOLFIRI/bevacizumab 07/16/2022.  He noted fairly significant fatigue.  3 days after the chemotherapy he developed diarrhea, 5-6 times per day.  He began Imodium and noted improvement within a few days.  If he stops Imodium the diarrhea recurs.  He reports 7-8 bowel movements yesterday, stool consistency ranges from loose to formed.  He reports bowel movements are very frequent but the stool is formed.  He noted a small amount of blood on the toilet tissue yesterday after 1 bowel movement.  He denies mouth sores.  No hand or foot pain or redness.  He reports good fluid intake.  Objective:  Vital signs in last 24 hours:  Blood pressure 132/80, pulse 77, temperature 98.2 F (36.8 C), temperature source Tympanic, resp. rate 18, weight 154 lb 9.6 oz (70.1 kg), SpO2 100 %.    HEENT: No thrush or ulcers.  Mucous membranes appear moist. Resp: Lungs clear bilaterally. Cardio: Regular rate and rhythm. GI: Abdomen soft and nontender.  No hepatomegaly. Vascular: No leg edema. Skin: Skin turgor is intact. Port-A-Cath without erythema.   Lab Results:  Lab Results  Component Value Date   WBC 4.2 07/30/2022   HGB 12.7 (L) 07/30/2022   HCT 36.6 (L) 07/30/2022   MCV 80.6 07/30/2022   PLT 264 07/30/2022   NEUTROABS 3.2 07/30/2022    Imaging:  No results found.  Medications: I have reviewed the patient's current medications.  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; foundation 1-microsatellite stable, tumor  mutation burden 5, K-rasG 12V, NRAS wildtype Colonoscopy 05/22/2021-ulcerated partially obstructing mass at 15 cm from anal verge CTs 05/30/2021-numerous small pulmonary nodules concerning for metastases, thickening of the rectum, multiple liver metastases Cycle 1 FOLFOX 06/12/2021 Cycle 2 FOLFOX 06/26/2021 Cycle 3 FOLFOX 07/10/2021, oxaliplatin dose reduced due to progressive decline in the [place] and platelet count Cycle 4 FOLFOX 07/24/2021 Cycle 5 FOLFOX 08/07/2021, 5-FU bolus eliminated and oxaliplatin dose reduced, insurance would not approve Udenyca CTs 08/15/2021-decrease size of lung nodules, decreased hepatic metastases, persistent anorectal wall thickening, stable right renal mass Cycle 6 FOLFOX 08/21/2021, 5-FU bolus and 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 Cycle 7 FOLFOX 09/04/2021, 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 Cycle 8 FOLFOX 09/18/2021, 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 Cycle 9 FOLFOX 10/08/2021, 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 Cycle 10 FOLFOX 10/23/2021, 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 CTs 11/01/2021-decrease in size of occasional small pulmonary nodules, continued decrease in size of multiple hypoenhancing hepatic metastases, unchanged posttreatment appearance of the low rectum, unchanged exophytic mass of the inferior pole of the right kidney measuring 1.5 x 1.3 cm Cycle 11 5-fluorouracil 11/06/2021 Cycle 12 5-fluorouracil 11/20/2021 Cycle 13 5-fluorouracil 12/04/2021 Maintenance Xeloda beginning 12/19/2021 Xeloda dose reduced to 1000 mg twice daily beginning 12/27/2021 Xeloda placed on hold 01/16/2022 CTs 01/22/2022-segment of distal ileum with circumferential wall thickening and perienteric inflammation, decrease in size of liver metastases, stable tiny bilateral pulmonary nodules, circumferential wall thickening in the rectum, stable lower pole right kidney lesion suspicious for renal cell  carcinoma Xeloda discontinued due to diarrhea 5-fluorouracil pump 02/05/2022 5-fluorouracil pump 02/26/2022 Treatment held 03/18/2022 due to diarrhea 5-fluorouracil  pump 03/25/2022, dose reduced due to diarrhea 5-fluorouracil pump 04/16/2022 CTs 05/03/2022-unchanged tiny pulmonary nodules, unchanged liver metastases, stable appearance of the low rectal wall thickening, unchanged posterior right renal mass 5-fluorouracil pump 05/07/2022 5-fluorouracil pump 05/28/2022 5-fluorouracil pump 06/18/2022 CTs 07/04/2022-increased size of liver metastases, no new lesions, new 3 mm right lower lobe nodule-nonspecific Cycle 1 FOLFIRI/bevacizumab 07/16/2022 Prostate cancer-simple prostatectomy January 2019, Gleason 3+3, 10 to 12% of specimen Active surveillance, biopsy May 2019 with small focus of Gleason 3+3 disease in 1/6 cores, surveillance continued Elevated PSA 04/20/2020 Biopsy 06/19/2020 8/12 cores positive, Gleason 4+4 PET scan 07/07/2020-negative CT 07/07/2020 right iliac and retrocaval adenopathy, 1.3 cm subcapsular right lower pole renal lesion Androgen deprivation therapy beginning 08/21/2020 Radiation to prostate, seminal vesicles, and pelvic lymph nodes to 10/22 - 12/28/2020 He continues every 64-monthFirmagon and daily Xtandi   3.  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  Mitral valve prolapse 5.  Family history of prostate cancer 6.  08/15/2021 with leg weakness and an episode of slurred speech-TIA?,  Brain imaging negative 7.  Diarrhea and fatigue while on capecitabine April 2023-capecitabine discontinued 01/18/2022    Disposition: Mr. RBrumbachappears stable.  He has completed 1 cycle of FOLFIRI/bevacizumab.  He had diarrhea for several days following the chemotherapy.  The diarrhea improves with Imodium but recurs if he attempts to discontinue Imodium.  He has lost weight.  We decided to hold today's treatment and reschedule  for 1 week with the plan for chemotherapy dose reductions.  Mr. RWhangis in agreement.  He will contact the office if the diarrhea persists over the next week.  He will return for lab, follow-up, FOLFIRI/bevacizumab in 1 week.  He will contact the office in the interim with any problems.    LNed CardANP/GNP-BC   07/30/2022  10:50 AM

## 2022-07-30 NOTE — Addendum Note (Signed)
Addended by: Velna Hatchet on: 07/30/2022 11:29 AM   Modules accepted: Orders

## 2022-07-30 NOTE — Progress Notes (Signed)
Patient seen by Lisa Thomas NP today  Vitals are within treatment parameters.  Labs reviewed by Lisa Thomas NP and are within treatment parameters.  Per physician team, patient will not be receiving treatment today.  

## 2022-08-01 ENCOUNTER — Inpatient Hospital Stay: Payer: Medicare Other

## 2022-08-05 ENCOUNTER — Inpatient Hospital Stay: Payer: Medicare Other

## 2022-08-05 ENCOUNTER — Encounter: Payer: Self-pay | Admitting: Nurse Practitioner

## 2022-08-05 ENCOUNTER — Inpatient Hospital Stay (HOSPITAL_BASED_OUTPATIENT_CLINIC_OR_DEPARTMENT_OTHER): Payer: Medicare Other | Admitting: Nurse Practitioner

## 2022-08-05 VITALS — BP 136/83 | HR 86 | Temp 97.9°F | Resp 18 | Ht 69.0 in | Wt 155.0 lb

## 2022-08-05 VITALS — BP 164/75 | HR 60

## 2022-08-05 DIAGNOSIS — C20 Malignant neoplasm of rectum: Secondary | ICD-10-CM

## 2022-08-05 DIAGNOSIS — Z5111 Encounter for antineoplastic chemotherapy: Secondary | ICD-10-CM | POA: Diagnosis not present

## 2022-08-05 LAB — CMP (CANCER CENTER ONLY)
ALT: 8 U/L (ref 0–44)
AST: 11 U/L — ABNORMAL LOW (ref 15–41)
Albumin: 4 g/dL (ref 3.5–5.0)
Alkaline Phosphatase: 87 U/L (ref 38–126)
Anion gap: 9 (ref 5–15)
BUN: 14 mg/dL (ref 8–23)
CO2: 26 mmol/L (ref 22–32)
Calcium: 9.6 mg/dL (ref 8.9–10.3)
Chloride: 106 mmol/L (ref 98–111)
Creatinine: 0.98 mg/dL (ref 0.61–1.24)
GFR, Estimated: >60 mL/min (ref >60)
Glucose, Bld: 108 mg/dL — ABNORMAL HIGH (ref 70–99)
Potassium: 3.9 mmol/L (ref 3.5–5.1)
Sodium: 141 mmol/L (ref 135–145)
Total Bilirubin: 0.5 mg/dL (ref 0.3–1.2)
Total Protein: 6.6 g/dL (ref 6.5–8.1)

## 2022-08-05 LAB — CBC WITH DIFFERENTIAL (CANCER CENTER ONLY)
Abs Immature Granulocytes: 0.01 10*3/uL (ref 0.00–0.07)
Basophils Absolute: 0 10*3/uL (ref 0.0–0.1)
Basophils Relative: 0 %
Eosinophils Absolute: 0.2 10*3/uL (ref 0.0–0.5)
Eosinophils Relative: 4 %
HCT: 38.1 % — ABNORMAL LOW (ref 39.0–52.0)
Hemoglobin: 13 g/dL (ref 13.0–17.0)
Immature Granulocytes: 0 %
Lymphocytes Relative: 14 %
Lymphs Abs: 0.7 10*3/uL (ref 0.7–4.0)
MCH: 27.7 pg (ref 26.0–34.0)
MCHC: 34.1 g/dL (ref 30.0–36.0)
MCV: 81.2 fL (ref 80.0–100.0)
Monocytes Absolute: 0.5 10*3/uL (ref 0.1–1.0)
Monocytes Relative: 11 %
Neutro Abs: 3.4 10*3/uL (ref 1.7–7.7)
Neutrophils Relative %: 71 %
Platelet Count: 304 10*3/uL (ref 150–400)
RBC: 4.69 MIL/uL (ref 4.22–5.81)
RDW: 15.3 % (ref 11.5–15.5)
WBC Count: 4.9 10*3/uL (ref 4.0–10.5)
nRBC: 0 % (ref 0.0–0.2)

## 2022-08-05 LAB — TOTAL PROTEIN, URINE DIPSTICK: Protein, ur: 30 mg/dL — AB

## 2022-08-05 MED ORDER — ATROPINE SULFATE 1 MG/ML IV SOLN
0.5000 mg | Freq: Once | INTRAVENOUS | Status: AC | PRN
  Administered 2022-08-05: 0.5 mg via INTRAVENOUS
  Filled 2022-08-05: qty 1

## 2022-08-05 MED ORDER — SODIUM CHLORIDE 0.9 % IV SOLN
1600.0000 mg/m2 | INTRAVENOUS | Status: DC
  Administered 2022-08-05: 2950 mg via INTRAVENOUS
  Filled 2022-08-05: qty 59

## 2022-08-05 MED ORDER — SODIUM CHLORIDE 0.9 % IV SOLN: Freq: Once | INTRAVENOUS | Status: AC

## 2022-08-05 MED ORDER — SODIUM CHLORIDE 0.9 % IV SOLN
120.0000 mg/m2 | Freq: Once | INTRAVENOUS | Status: AC
  Administered 2022-08-05: 220 mg via INTRAVENOUS
  Filled 2022-08-05: qty 2

## 2022-08-05 MED ORDER — SODIUM CHLORIDE 0.9 % IV SOLN
10.0000 mg | Freq: Once | INTRAVENOUS | Status: AC
  Administered 2022-08-05: 10 mg via INTRAVENOUS
  Filled 2022-08-05: qty 10

## 2022-08-05 MED ORDER — SODIUM CHLORIDE 0.9 % IV SOLN
5.0000 mg/kg | Freq: Once | INTRAVENOUS | Status: AC
  Administered 2022-08-05: 350 mg via INTRAVENOUS
  Filled 2022-08-05: qty 14

## 2022-08-05 MED ORDER — PALONOSETRON HCL INJECTION 0.25 MG/5ML
0.2500 mg | Freq: Once | INTRAVENOUS | Status: AC
  Administered 2022-08-05: 0.25 mg via INTRAVENOUS
  Filled 2022-08-05: qty 5

## 2022-08-05 MED ORDER — SODIUM CHLORIDE 0.9 % IV SOLN
400.0000 mg/m2 | Freq: Once | INTRAVENOUS | Status: AC
  Administered 2022-08-05: 740 mg via INTRAVENOUS
  Filled 2022-08-05: qty 37

## 2022-08-05 NOTE — Progress Notes (Signed)
[place] OFFICE PROGRESS NOTE   Diagnosis: Colon cancer  INTERVAL HISTORY:   Neil Perry returns as scheduled.  He completed cycle 1 FOLFIRI/bevacizumab 07/16/2022.  Cycle 2 was held last week due to diarrhea.  He stopped Imodium after the last office visit.  He developed some constipation.  He had a formed bowel movement this morning.  No nausea or vomiting.  He describes a "painful hemorrhoid".  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  He reports recent PSA was "undetectable".  Objective:  Vital signs in last 24 hours:  Blood pressure 136/83, pulse 86, temperature 97.9 F (36.6 C), temperature source Oral, resp. rate 18, height _0  (1.753 m), weight 155 lb (70.3 kg), SpO2 100 %.    HEENT: No thrush or ulcers. Resp: Lungs clear bilaterally. Cardio: Regular rate and rhythm. GI: Abdomen soft and nontender.  No hepatosplenomegaly. Vascular: No leg edema. Skin: Palms without erythema. Port-A-Cath without erythema.   Lab Results:  Lab Results  Component Value Date   WBC 4.9 08/05/2022   HGB 13.0 08/05/2022   HCT 38.1 (L) 08/05/2022   MCV 81.2 08/05/2022   PLT 304 08/05/2022   NEUTROABS 3.4 08/05/2022    Imaging:  No results found.  Medications: I have reviewed the patient's current medications.  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; foundation 1-microsatellite stable, tumor mutation burden 5, K-rasG 12V, NRAS wildtype Colonoscopy 05/22/2021-ulcerated partially obstructing mass at 15 cm from anal verge CTs 05/30/2021-numerous small pulmonary nodules concerning for metastases, thickening of the rectum,  multiple liver metastases Cycle 1 FOLFOX 06/12/2021 Cycle 2 FOLFOX 06/26/2021 Cycle 3 FOLFOX 07/10/2021, oxaliplatin dose reduced due to progressive decline in the [place] and platelet count Cycle 4 FOLFOX 07/24/2021 Cycle 5 FOLFOX 08/07/2021, 5-FU bolus eliminated and oxaliplatin dose reduced, insurance would not approve Udenyca CTs 08/15/2021-decrease size of lung nodules, decreased hepatic metastases, persistent anorectal wall thickening, stable right renal mass Cycle 6 FOLFOX 08/21/2021, 5-FU bolus and 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 Cycle 7 FOLFOX 09/04/2021, 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 Cycle 8 FOLFOX 09/18/2021, 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 Cycle 9 FOLFOX 10/08/2021, 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 Cycle 10 FOLFOX 10/23/2021, 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 CTs 11/01/2021-decrease in size of occasional small pulmonary nodules, continued decrease in size of multiple hypoenhancing hepatic metastases, unchanged posttreatment appearance of the low rectum, unchanged exophytic mass of the inferior pole of the right kidney measuring 1.5 x 1.3 cm Cycle 11 5-fluorouracil 11/06/2021 Cycle 12 5-fluorouracil 11/20/2021 Cycle 13 5-fluorouracil 12/04/2021 Maintenance Xeloda beginning 12/19/2021 Xeloda dose reduced to 1000 mg twice daily beginning 12/27/2021 Xeloda placed on hold 01/16/2022 CTs 01/22/2022-segment of distal ileum with circumferential wall thickening and perienteric inflammation, decrease in size of liver metastases, stable tiny bilateral pulmonary nodules, circumferential wall thickening in the rectum, stable lower pole right kidney lesion suspicious for renal cell carcinoma Xeloda discontinued due to diarrhea 5-fluorouracil pump 02/05/2022 5-fluorouracil pump 02/26/2022 Treatment held 03/18/2022 due to diarrhea 5-fluorouracil pump 03/25/2022, dose reduced due to diarrhea 5-fluorouracil pump  04/16/2022 CTs 05/03/2022-unchanged tiny pulmonary nodules, unchanged liver metastases, stable appearance of the low rectal wall thickening, unchanged posterior right renal mass 5-fluorouracil pump 05/07/2022 5-fluorouracil pump 05/28/2022 5-fluorouracil pump 06/18/2022 CTs 07/04/2022-increased size  of liver metastases, no new lesions, new 3 mm right lower lobe nodule-nonspecific Cycle 1 FOLFIRI/bevacizumab 07/16/2022 Cycle 2 FOLFIRI/bevacizumab 08/05/2022; 5-FU bolus eliminated, irinotecan dose reduced due to diarrhea and weight loss Prostate cancer-simple prostatectomy January 2019, Gleason 3+3, 10 to 12% of specimen Active surveillance, biopsy May 2019 with small focus of Gleason 3+3 disease in 1/6 cores, surveillance continued Elevated PSA 04/20/2020 Biopsy 06/19/2020 8/12 cores positive, Gleason 4+4 PET scan 07/07/2020-negative CT 07/07/2020 right iliac and retrocaval adenopathy, 1.3 cm subcapsular right lower pole renal lesion Androgen deprivation therapy beginning 08/21/2020 Radiation to prostate, seminal vesicles, and pelvic lymph nodes to 10/22 - 12/28/2020 He continues every 74-monthFirmagon and daily Xtandi   3.  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  Mitral valve prolapse 5.  Family history of prostate cancer 6.  08/15/2021 with leg weakness and an episode of slurred speech-TIA?,  Brain imaging negative 7.  Diarrhea and fatigue while on capecitabine April 2023-capecitabine discontinued 01/18/2022  Disposition: Neil Perry stable.  He has completed 1 cycle of FOLFIRI/bevacizumab.  He had significant diarrhea.  Cycle 2 was held last week.  The diarrhea has resolved.  Plan to proceed with cycle 2 FOLFIRI/bevacizumab today as scheduled.  5-FU bolus eliminated, irinotecan dose reduced.  CBC and chemistry panel reviewed.  Labs adequate to proceed as above.  He will contact the office with poorly controlled  diarrhea.  He has Imodium and Lomotil for as needed use.  He will return for lab, follow-up, cycle 3 FOLFIRI/bevacizumab in 2 weeks.  We are available to see him sooner if needed.    LNed CardANP/GNP-BC   08/05/2022  9:22 AM

## 2022-08-05 NOTE — Patient Instructions (Signed)
[facility]   Discharge Instructions: Thank you for choosing [facility] to provide your oncology and hematology care.   If you have a lab appointment with the [facility], please go directly to the [facility] and check in at the registration area.   Wear comfortable clothing and clothing appropriate for easy access to any Portacath or PICC line.   We strive to give you quality time with your provider. You may need to reschedule your appointment if you arrive late (15 or more minutes).  Arriving late affects you and other patients whose appointments are after yours.  Also, if you miss three or more appointments without notifying the office, you may be dismissed from the clinic at the provider's discretion.      For prescription refill requests, have your pharmacy contact our office and allow 72 hours for refills to be completed.    Today you received the following chemotherapy and/or immunotherapy agents Bevacizumab (VEGZELMA), Irinotecan (CAMPTOSAR), Leucovorin & Flourouracil (ADRUCIL).      To help prevent nausea and vomiting after your treatment, we encourage you to take your nausea medication as directed.  BELOW ARE SYMPTOMS THAT SHOULD BE REPORTED IMMEDIATELY: *FEVER GREATER THAN 100.4 F (38 C) OR HIGHER *CHILLS OR SWEATING *NAUSEA AND VOMITING THAT IS NOT CONTROLLED WITH YOUR NAUSEA MEDICATION *UNUSUAL SHORTNESS OF BREATH *UNUSUAL BRUISING OR BLEEDING *URINARY PROBLEMS (pain or burning when urinating, or frequent urination) *BOWEL PROBLEMS (unusual diarrhea, constipation, pain near the anus) TENDERNESS IN MOUTH AND THROAT WITH OR WITHOUT PRESENCE OF ULCERS (sore throat, sores in mouth, or a toothache) UNUSUAL RASH, SWELLING OR PAIN  UNUSUAL VAGINAL DISCHARGE OR ITCHING   Items with * indicate a potential emergency and should be followed up as soon as possible or go to the Emergency Department if any problems should occur.  Please  show the CHEMOTHERAPY ALERT CARD or IMMUNOTHERAPY ALERT CARD at check-in to the Emergency Department and triage nurse.  Should you have questions after your visit or need to cancel or reschedule your appointment, please contact [facility]  Dept: [phone]  and follow the prompts.  Office hours are 8:00 a.m. to 4:30 p.m. Monday - Friday. Please note that voicemails left after 4:00 p.m. may not be returned until the following business day.  We are closed weekends and major holidays. You have access to a nurse at all times for urgent questions. Please call the main number to the clinic Dept: [phone] and follow the prompts.   For any non-urgent questions, you may also contact your provider using MyChart. We now offer e-Visits for anyone 78 and older to request care online for non-urgent symptoms. For details visit [email or web address].   Also download the MyChart app! Go to the app store, search "MyChart", open the app, select [facility], and log in with your MyChart username and password.  Masks are optional in the cancer centers. If you would like for your care team to wear a mask while they are taking care of you, please let them know. You may have one support person who is at least 82 years old accompany you for your appointments.  Bevacizumab Injection What is this medication? BEVACIZUMAB (be va SIZ yoo mab) treats some types of cancer. It works by blocking a protein that causes cancer cells to grow and multiply. This helps to slow or stop the spread of cancer cells. It is a monoclonal antibody. This medicine may be used for  other purposes; ask your health care provider or pharmacist if you have questions. COMMON BRAND NAME(S): Alymsys, Avastin, MVASI, Noah Charon What should I tell my care team before I take this medication? They need to know if you have any of these conditions: Blood clots Coughing up blood Having or recent surgery Heart failure High blood  pressure History of a connection between 2 or more body parts that do not usually connect (fistula) History of a tear in your stomach or intestines Protein in your urine An unusual or allergic reaction to bevacizumab, other medications, foods, dyes, or preservatives Pregnant or trying to get pregnant Breast-feeding How should I use this medication? This medication is injected into a vein. It is given by your care team in a hospital or clinic setting. Talk to your care team the use of this medication in children. Special care may be needed. Overdosage: If you think you have taken too much of this medicine contact a poison control center or emergency room at once. NOTE: This medicine is only for you. Do not share this medicine with others. What if I miss a dose? Keep appointments for follow-up doses. It is important not to miss your dose. Call your care team if you are unable to keep an appointment. What may interact with this medication? Interactions are not expected. This list may not describe all possible interactions. Give your health care provider a list of all the medicines, herbs, non-prescription drugs, or dietary supplements you use. Also tell them if you smoke, drink alcohol, or use illegal drugs. Some items may interact with your medicine. What should I watch for while using this medication? Your condition will be monitored carefully while you are receiving this medication. You may need blood work while taking this medication. This medication may make you feel generally unwell. This is not uncommon as chemotherapy can affect healthy cells as well as cancer cells. Report any side effects. Continue your course of treatment even though you feel ill unless your care team tells you to stop. This medication may increase your risk to bruise or bleed. Call your care team if you notice any unusual bleeding. Before having surgery, talk to your care team to make sure it is ok. This medication can  increase the risk of poor healing of your surgical site or wound. You will need to stop this medication for 28 days before surgery. After surgery, wait at least 28 days before restarting this medication. Make sure the surgical site or wound is healed enough before restarting this medication. Talk to your care team if questions. Talk to your care team if you may be pregnant. Serious birth defects can occur if you take this medication during pregnancy and for 6 months after the last dose. Contraception is recommended while taking this medication and for 6 months after the last dose. Your care team can help you find the option that works for you. Do not breastfeed while taking this medication and for 6 months after the last dose. This medication can cause infertility. Talk to your care team if you are concerned about your fertility. What side effects may I notice from receiving this medication? Side effects that you should report to your care team as soon as possible: Allergic reactions--skin rash, itching, hives, swelling of the face, lips, tongue, or throat Bleeding--bloody or black, tar-like stools, vomiting blood or brown material that looks like coffee grounds, red or dark brown urine, small red or purple spots on skin, unusual bruising or bleeding  Blood clot--pain, swelling, or warmth in the leg, shortness of breath, chest pain Heart attack--pain or tightness in the chest, shoulders, arms, or jaw, nausea, shortness of breath, cold or clammy skin, feeling faint or lightheaded Heart failure--shortness of breath, swelling of the ankles, feet, or hands, sudden weight gain, unusual weakness or fatigue Increase in blood pressure Infection--fever, chills, cough, sore throat, wounds that don't heal, pain or trouble when passing urine, general feeling of discomfort or being unwell Infusion reactions--chest pain, shortness of breath or trouble breathing, feeling faint or lightheaded Kidney injury--decrease in  the amount of urine, swelling of the ankles, hands, or feet Stomach pain that is severe, does not go away, or gets worse Stroke--sudden numbness or weakness of the face, arm, or leg, trouble speaking, confusion, trouble walking, loss of balance or coordination, dizziness, severe headache, change in vision Sudden and severe headache, confusion, change in vision, seizures, which may be signs of posterior reversible encephalopathy syndrome (PRES) Side effects that usually do not require medical attention (report to your care team if they continue or are bothersome): Back pain Change in taste Diarrhea Dry skin Increased tears Nosebleed This list may not describe all possible side effects. Call your doctor for medical advice about side effects. You may report side effects to FDA at 1-800-FDA-1088. Where should I keep my medication? This medication is given in a hospital or clinic. It will not be stored at home. NOTE: This sheet is a summary. It may not cover all possible information. If you have questions about this medicine, talk to your doctor, pharmacist, or health care provider.  2023 Elsevier/Gold Standard (2022-01-11 00:00:00)  Irinotecan Injection What is this medication? IRINOTECAN (ir in oh TEE kan) treats some types of cancer. It works by slowing down the growth of cancer cells. This medicine may be used for other purposes; ask your health care provider or pharmacist if you have questions. COMMON BRAND NAME(S): Camptosar What should I tell my care team before I take this medication? They need to know if you have any of these conditions: Dehydration Diarrhea Infection, especially a viral infection, such as chickenpox, cold sores, herpes Liver disease Low blood cell levels (white cells, red cells, and platelets) Low levels of electrolytes, such as calcium, magnesium, or potassium in your blood Recent or ongoing radiation An unusual or allergic reaction to irinotecan, other  medications, foods, dyes, or preservatives If you or your partner are pregnant or trying to get pregnant Breast-feeding How should I use this medication? This medication is injected into a vein. It is given by your care team in a hospital or clinic setting. Talk to your care team about the use of this medication in children. Special care may be needed. Overdosage: If you think you have taken too much of this medicine contact a poison control center or emergency room at once. NOTE: This medicine is only for you. Do not share this medicine with others. What if I miss a dose? Keep appointments for follow-up doses. It is important not to miss your dose. Call your care team if you are unable to keep an appointment. What may interact with this medication? Do not take this medication with any of the following: Cobicistat Itraconazole This medication may also interact with the following: Certain antibiotics, such as clarithromycin, rifampin, rifabutin Certain antivirals for HIV or AIDS Certain medications for fungal infections, such as ketoconazole, posaconazole, voriconazole Certain medications for seizures, such as carbamazepine, phenobarbital, phenytoin Gemfibrozil Nefazodone St. John's wort This list may  not describe all possible interactions. Give your health care provider a list of all the medicines, herbs, non-prescription drugs, or dietary supplements you use. Also tell them if you smoke, drink alcohol, or use illegal drugs. Some items may interact with your medicine. What should I watch for while using this medication? Your condition will be monitored carefully while you are receiving this medication. You may need blood work while taking this medication. This medication may make you feel generally unwell. This is not uncommon as chemotherapy can affect healthy cells as well as cancer cells. Report any side effects. Continue your course of treatment even though you feel ill unless your care  team tells you to stop. This medication can cause serious side effects. To reduce the risk, your care team may give you other medications to take before receiving this one. Be sure to follow the directions from your care team. This medication may affect your coordination, reaction time, or judgement. Do not drive or operate machinery until you know how this medication affects you. Sit up or stand slowly to reduce the risk of dizzy or fainting spells. Drinking alcohol with this medication can increase the risk of these side effects. This medication may increase your risk of getting an infection. Call your care team for advice if you get a fever, chills, sore throat, or other symptoms of a cold or flu. Do not treat yourself. Try to avoid being around people who are sick. Avoid taking medications that contain aspirin, acetaminophen, ibuprofen, naproxen, or ketoprofen unless instructed by your care team. These medications may hide a fever. This medication may increase your risk to bruise or bleed. Call your care team if you notice any unusual bleeding. Be careful brushing or flossing your teeth or using a toothpick because you may get an infection or bleed more easily. If you have any dental work done, tell your dentist you are receiving this medication. Talk to your care team if you or your partner are pregnant or think either of you might be pregnant. This medication can cause serious birth defects if taken during pregnancy and for 6 months after the last dose. You will need a negative pregnancy test before starting this medication. Contraception is recommended while taking this medication and for 6 months after the last dose. Your care team can help you find the option that works for you. Do not father a child while taking this medication and for 3 months after the last dose. Use a condom for contraception during this time period. Do not breastfeed while taking this medication and for 7 days after the last  dose. This medication may cause infertility. Talk to your care team if you are concerned about your fertility. What side effects may I notice from receiving this medication? Side effects that you should report to your care team as soon as possible: Allergic reactions--skin rash, itching, hives, swelling of the face, lips, tongue, or throat Dry cough, shortness of breath or trouble breathing Increased saliva or tears, increased sweating, stomach cramping, diarrhea, small pupils, unusual weakness or fatigue, slow heartbeat Infection--fever, chills, cough, sore throat, wounds that don't heal, pain or trouble when passing urine, general feeling of discomfort or being unwell Kidney injury--decrease in the amount of urine, swelling of the ankles, hands, or feet Low red blood cell level--unusual weakness or fatigue, dizziness, headache, trouble breathing Severe or prolonged diarrhea Unusual bruising or bleeding Side effects that usually do not require medical attention (report to your care team if they  continue or are bothersome): Constipation Diarrhea Hair loss Loss of appetite Nausea Stomach pain This list may not describe all possible side effects. Call your doctor for medical advice about side effects. You may report side effects to FDA at 1-800-FDA-1088. Where should I keep my medication? This medication is given in a hospital or clinic. It will not be stored at home. NOTE: This sheet is a summary. It may not cover all possible information. If you have questions about this medicine, talk to your doctor, pharmacist, or health care provider.  2023 Elsevier/Gold Standard (2022-01-17 00:00:00)  Leucovorin Injection What is this medication? LEUCOVORIN (loo koe VOR in) prevents side effects from certain medications, such as methotrexate. It works by increasing folate levels. This helps protect healthy cells in your body. It may also be used to treat anemia caused by low levels of folate. It can  also be used with fluorouracil, a type of chemotherapy, to treat colorectal cancer. It works by increasing the effects of fluorouracil in the body. This medicine may be used for other purposes; ask your health care provider or pharmacist if you have questions. What should I tell my care team before I take this medication? They need to know if you have any of these conditions: Anemia from low levels of vitamin B12 in the blood An unusual or allergic reaction to leucovorin, folic acid, other medications, foods, dyes, or preservatives Pregnant or trying to get pregnant Breastfeeding How should I use this medication? This medication is injected into a vein or a muscle. It is given by your care team in a hospital or clinic setting. Talk to your care team about the use of this medication in children. Special care may be needed. Overdosage: If you think you have taken too much of this medicine contact a poison control center or emergency room at once. NOTE: This medicine is only for you. Do not share this medicine with others. What if I miss a dose? Keep appointments for follow-up doses. It is important not to miss your dose. Call your care team if you are unable to keep an appointment. What may interact with this medication? Capecitabine Fluorouracil Phenobarbital Phenytoin Primidone Trimethoprim;sulfamethoxazole This list may not describe all possible interactions. Give your health care provider a list of all the medicines, herbs, non-prescription drugs, or dietary supplements you use. Also tell them if you smoke, drink alcohol, or use illegal drugs. Some items may interact with your medicine. What should I watch for while using this medication? Your condition will be monitored carefully while you are receiving this medication. This medication may increase the side effects of 5-fluorouracil. Tell your care team if you have diarrhea or mouth sores that do not get better or that get worse. What  side effects may I notice from receiving this medication? Side effects that you should report to your care team as soon as possible: Allergic reactions--skin rash, itching, hives, swelling of the face, lips, tongue, or throat This list may not describe all possible side effects. Call your doctor for medical advice about side effects. You may report side effects to FDA at 1-800-FDA-1088. Where should I keep my medication? This medication is given in a hospital or clinic. It will not be stored at home. NOTE: This sheet is a summary. It may not cover all possible information. If you have questions about this medicine, talk to your doctor, pharmacist, or health care provider.  2023 Elsevier/Gold Standard (2022-02-12 00:00:00)  Fluorouracil Injection What is this medication? FLUOROURACIL (  flure oh YOOR a sil) treats some types of cancer. It works by slowing down the growth of cancer cells. This medicine may be used for other purposes; ask your health care provider or pharmacist if you have questions. COMMON BRAND NAME(S): Adrucil What should I tell my care team before I take this medication? They need to know if you have any of these conditions: Blood disorders Dihydropyrimidine dehydrogenase (DPD) deficiency Infection, such as chickenpox, cold sores, herpes Kidney disease Liver disease Poor nutrition Recent or ongoing radiation therapy An unusual or allergic reaction to fluorouracil, other medications, foods, dyes, or preservatives If you or your partner are pregnant or trying to get pregnant Breast-feeding How should I use this medication? This medication is injected into a vein. It is administered by your care team in a hospital or clinic setting. Talk to your care team about the use of this medication in children. Special care may be needed. Overdosage: If you think you have taken too much of this medicine contact a poison control center or emergency room at once. NOTE: This medicine is  only for you. Do not share this medicine with others. What if I miss a dose? Keep appointments for follow-up doses. It is important not to miss your dose. Call your care team if you are unable to keep an appointment. What may interact with this medication? Do not take this medication with any of the following: Live virus vaccines This medication may also interact with the following: Medications that treat or prevent blood clots, such as warfarin, enoxaparin, dalteparin This list may not describe all possible interactions. Give your health care provider a list of all the medicines, herbs, non-prescription drugs, or dietary supplements you use. Also tell them if you smoke, drink alcohol, or use illegal drugs. Some items may interact with your medicine. What should I watch for while using this medication? Your condition will be monitored carefully while you are receiving this medication. This medication may make you feel generally unwell. This is not uncommon as chemotherapy can affect healthy cells as well as cancer cells. Report any side effects. Continue your course of treatment even though you feel ill unless your care team tells you to stop. In some cases, you may be given additional medications to help with side effects. Follow all directions for their use. This medication may increase your risk of getting an infection. Call your care team for advice if you get a fever, chills, sore throat, or other symptoms of a cold or flu. Do not treat yourself. Try to avoid being around people who are sick. This medication may increase your risk to bruise or bleed. Call your care team if you notice any unusual bleeding. Be careful brushing or flossing your teeth or using a toothpick because you may get an infection or bleed more easily. If you have any dental work done, tell your dentist you are receiving this medication. Avoid taking medications that contain aspirin, acetaminophen, ibuprofen, naproxen, or  ketoprofen unless instructed by your care team. These medications may hide a fever. Do not treat diarrhea with over the counter products. Contact your care team if you have diarrhea that lasts more than 2 days or if it is severe and watery. This medication can make you more sensitive to the sun. Keep out of the sun. If you cannot avoid being in the sun, wear protective clothing and sunscreen. Do not use sun lamps, tanning beds, or tanning booths. Talk to your care team if you or  your partner wish to become pregnant or think you might be pregnant. This medication can cause serious birth defects if taken during pregnancy and for 3 months after the last dose. A reliable form of contraception is recommended while taking this medication and for 3 months after the last dose. Talk to your care team about effective forms of contraception. Do not father a child while taking this medication and for 3 months after the last dose. Use a condom while having sex during this time period. Do not breastfeed while taking this medication. This medication may cause infertility. Talk to your care team if you are concerned about your fertility. What side effects may I notice from receiving this medication? Side effects that you should report to your care team as soon as possible: Allergic reactions--skin rash, itching, hives, swelling of the face, lips, tongue, or throat Heart attack--pain or tightness in the chest, shoulders, arms, or jaw, nausea, shortness of breath, cold or clammy skin, feeling faint or lightheaded Heart failure--shortness of breath, swelling of the ankles, feet, or hands, sudden weight gain, unusual weakness or fatigue Heart rhythm changes--fast or irregular heartbeat, dizziness, feeling faint or lightheaded, chest pain, trouble breathing High ammonia level--unusual weakness or fatigue, confusion, loss of appetite, nausea, vomiting, seizures Infection--fever, chills, cough, sore throat, wounds that don't  heal, pain or trouble when passing urine, general feeling of discomfort or being unwell Low red blood cell level--unusual weakness or fatigue, dizziness, headache, trouble breathing Pain, tingling, or numbness in the hands or feet, muscle weakness, change in vision, confusion or trouble speaking, loss of balance or coordination, trouble walking, seizures Redness, swelling, and blistering of the skin over hands and feet Severe or prolonged diarrhea Unusual bruising or bleeding Side effects that usually do not require medical attention (report to your care team if they continue or are bothersome): Dry skin Headache Increased tears Nausea Pain, redness, or swelling with sores inside the mouth or throat Sensitivity to light Vomiting This list may not describe all possible side effects. Call your doctor for medical advice about side effects. You may report side effects to FDA at 1-800-FDA-1088. Where should I keep my medication? This medication is given in a hospital or clinic. It will not be stored at home. NOTE: This sheet is a summary. It may not cover all possible information. If you have questions about this medicine, talk to your doctor, pharmacist, or health care provider.  2023 [employer] (2022-01-08 00:00:00)  The chemotherapy medication bag should finish at 46 hours, 96 hours, or 7 days. For example, if your pump is scheduled for 46 hours and it was put on at 4:00 p.m., it should finish at 2:00 p.m. the day it is scheduled to come off regardless of your appointment time.     Estimated time to finish at 11:45 a.m. on Wednesday 08/07/2022.   If the display on your pump reads "Low Volume" and it is beeping, take the batteries out of the pump and come to the cancer center for it to be taken off.   If the pump alarms go off prior to the pump reading "Low Volume" then call [phone] and someone can assist you.  If the plunger comes out and the chemotherapy medication is  leaking out, please use your home chemo spill kit to clean up the spill. Do NOT use paper towels or other household products.  If you have problems or questions regarding your pump, please call either [phone] (24 hours a day) or the  cancer center Monday-Friday 8:00 a.m.- 4:30 p.m. at the clinic number and we will assist you. If you are unable to get assistance, then go to the nearest Emergency Department and ask the staff to contact the IV team for assistance.

## 2022-08-07 ENCOUNTER — Inpatient Hospital Stay: Payer: Medicare Other

## 2022-08-07 VITALS — BP 137/76 | HR 65 | Temp 98.1°F | Resp 20

## 2022-08-07 DIAGNOSIS — C20 Malignant neoplasm of rectum: Secondary | ICD-10-CM

## 2022-08-07 DIAGNOSIS — Z5111 Encounter for antineoplastic chemotherapy: Secondary | ICD-10-CM | POA: Diagnosis not present

## 2022-08-07 MED ORDER — HEPARIN SOD (PORK) LOCK FLUSH 100 UNIT/ML IV SOLN
500.0000 [IU] | Freq: Once | INTRAVENOUS | Status: AC | PRN
  Administered 2022-08-07: 500 [IU]

## 2022-08-07 MED ORDER — SODIUM CHLORIDE 0.9% FLUSH
10.0000 mL | INTRAVENOUS | Status: DC | PRN
  Administered 2022-08-07: 10 mL

## 2022-08-07 NOTE — Patient Instructions (Signed)

## 2022-08-18 ENCOUNTER — Other Ambulatory Visit: Payer: Self-pay | Admitting: Oncology

## 2022-08-18 DIAGNOSIS — C20 Malignant neoplasm of rectum: Secondary | ICD-10-CM

## 2022-08-20 ENCOUNTER — Other Ambulatory Visit (HOSPITAL_BASED_OUTPATIENT_CLINIC_OR_DEPARTMENT_OTHER): Payer: Self-pay

## 2022-08-20 MED ORDER — AREXVY 120 MCG/0.5ML IM SUSR
INTRAMUSCULAR | 0 refills | Status: DC
  Filled 2022-08-20: qty 0.5, 1d supply, fill #0

## 2022-08-21 ENCOUNTER — Inpatient Hospital Stay (HOSPITAL_BASED_OUTPATIENT_CLINIC_OR_DEPARTMENT_OTHER): Payer: Medicare Other | Admitting: Nurse Practitioner

## 2022-08-21 ENCOUNTER — Inpatient Hospital Stay: Payer: Medicare Other

## 2022-08-21 ENCOUNTER — Encounter: Payer: Self-pay | Admitting: Nurse Practitioner

## 2022-08-21 ENCOUNTER — Inpatient Hospital Stay: Payer: Medicare Other | Admitting: Nutrition

## 2022-08-21 VITALS — BP 142/80 | HR 100 | Temp 98.1°F | Resp 20 | Ht 69.0 in | Wt 152.8 lb

## 2022-08-21 VITALS — BP 155/79 | HR 73

## 2022-08-21 DIAGNOSIS — C20 Malignant neoplasm of rectum: Secondary | ICD-10-CM

## 2022-08-21 DIAGNOSIS — Z5111 Encounter for antineoplastic chemotherapy: Secondary | ICD-10-CM | POA: Diagnosis not present

## 2022-08-21 LAB — CBC WITH DIFFERENTIAL (CANCER CENTER ONLY)
Abs Immature Granulocytes: 0.01 10*3/uL (ref 0.00–0.07)
Basophils Absolute: 0 10*3/uL (ref 0.0–0.1)
Basophils Relative: 0 %
Eosinophils Absolute: 0.1 10*3/uL (ref 0.0–0.5)
Eosinophils Relative: 3 %
HCT: 38.3 % — ABNORMAL LOW (ref 39.0–52.0)
Hemoglobin: 12.9 g/dL — ABNORMAL LOW (ref 13.0–17.0)
Immature Granulocytes: 0 %
Lymphocytes Relative: 9 %
Lymphs Abs: 0.5 10*3/uL — ABNORMAL LOW (ref 0.7–4.0)
MCH: 27.7 pg (ref 26.0–34.0)
MCHC: 33.7 g/dL (ref 30.0–36.0)
MCV: 82.2 fL (ref 80.0–100.0)
Monocytes Absolute: 0.5 10*3/uL (ref 0.1–1.0)
Monocytes Relative: 8 %
Neutro Abs: 4.5 10*3/uL (ref 1.7–7.7)
Neutrophils Relative %: 80 %
Platelet Count: 266 10*3/uL (ref 150–400)
RBC: 4.66 MIL/uL (ref 4.22–5.81)
RDW: 15.6 % — ABNORMAL HIGH (ref 11.5–15.5)
WBC Count: 5.6 10*3/uL (ref 4.0–10.5)
nRBC: 0 % (ref 0.0–0.2)

## 2022-08-21 LAB — CMP (CANCER CENTER ONLY)
ALT: 9 U/L (ref 0–44)
AST: 12 U/L — ABNORMAL LOW (ref 15–41)
Albumin: 4.1 g/dL (ref 3.5–5.0)
Alkaline Phosphatase: 83 U/L (ref 38–126)
Anion gap: 9 (ref 5–15)
BUN: 13 mg/dL (ref 8–23)
CO2: 25 mmol/L (ref 22–32)
Calcium: 9.4 mg/dL (ref 8.9–10.3)
Chloride: 107 mmol/L (ref 98–111)
Creatinine: 1.03 mg/dL (ref 0.61–1.24)
GFR, Estimated: >60 mL/min (ref >60)
Glucose, Bld: 106 mg/dL — ABNORMAL HIGH (ref 70–99)
Potassium: 3.9 mmol/L (ref 3.5–5.1)
Sodium: 141 mmol/L (ref 135–145)
Total Bilirubin: 0.8 mg/dL (ref 0.3–1.2)
Total Protein: 6.6 g/dL (ref 6.5–8.1)

## 2022-08-21 LAB — TOTAL PROTEIN, URINE DIPSTICK: Protein, ur: 30 mg/dL — AB

## 2022-08-21 LAB — CEA (ACCESS): CEA (CHCC): 104.78 ng/mL — ABNORMAL HIGH (ref 0.00–5.00)

## 2022-08-21 MED ORDER — SODIUM CHLORIDE 0.9 % IV SOLN
400.0000 mg/m2 | Freq: Once | INTRAVENOUS | Status: AC
  Administered 2022-08-21: 740 mg via INTRAVENOUS
  Filled 2022-08-21: qty 37

## 2022-08-21 MED ORDER — SODIUM CHLORIDE 0.9 % IV SOLN
10.0000 mg | Freq: Once | INTRAVENOUS | Status: AC
  Administered 2022-08-21: 10 mg via INTRAVENOUS
  Filled 2022-08-21: qty 1

## 2022-08-21 MED ORDER — SODIUM CHLORIDE 0.9 % IV SOLN
5.0000 mg/kg | Freq: Once | INTRAVENOUS | Status: AC
  Administered 2022-08-21: 350 mg via INTRAVENOUS
  Filled 2022-08-21: qty 14

## 2022-08-21 MED ORDER — SODIUM CHLORIDE 0.9% FLUSH
10.0000 mL | INTRAVENOUS | Status: DC | PRN
  Administered 2022-08-21: 10 mL

## 2022-08-21 MED ORDER — SODIUM CHLORIDE 0.9 % IV SOLN
120.0000 mg/m2 | Freq: Once | INTRAVENOUS | Status: AC
  Administered 2022-08-21: 220 mg via INTRAVENOUS
  Filled 2022-08-21: qty 10

## 2022-08-21 MED ORDER — PALONOSETRON HCL INJECTION 0.25 MG/5ML
0.2500 mg | Freq: Once | INTRAVENOUS | Status: AC
  Administered 2022-08-21: 0.25 mg via INTRAVENOUS
  Filled 2022-08-21: qty 5

## 2022-08-21 MED ORDER — ATROPINE SULFATE 1 MG/ML IV SOLN
0.5000 mg | Freq: Once | INTRAVENOUS | Status: AC | PRN
  Administered 2022-08-21: 0.5 mg via INTRAVENOUS
  Filled 2022-08-21: qty 1

## 2022-08-21 MED ORDER — SODIUM CHLORIDE 0.9 % IV SOLN
1600.0000 mg/m2 | INTRAVENOUS | Status: DC
  Administered 2022-08-21: 2950 mg via INTRAVENOUS
  Filled 2022-08-21: qty 59

## 2022-08-21 MED ORDER — SODIUM CHLORIDE 0.9 % IV SOLN: Freq: Once | INTRAVENOUS | Status: AC

## 2022-08-21 NOTE — Patient Instructions (Addendum)
[place]   Discharge Instructions: Thank you for choosing [facility] to provide your oncology and hematology care.   If you have a lab appointment with the [facility], please go directly to the [facility] and check in at the registration area.   Wear comfortable clothing and clothing appropriate for easy access to any Portacath or PICC line.   We strive to give you quality time with your provider. You may need to reschedule your appointment if you arrive late (15 or more minutes).  Arriving late affects you and other patients whose appointments are after yours.  Also, if you miss three or more appointments without notifying the office, you may be dismissed from the clinic at the provider's discretion.      For prescription refill requests, have your pharmacy contact our office and allow 72 hours for refills to be completed.    Today you received the following chemotherapy and/or immunotherapy agents Bevacizumab (VEGZELMA), Irinotecan (CAMPTOSAR), Leucovorin & Flourouracil (ADRUCIL).      To help prevent nausea and vomiting after your treatment, we encourage you to take your nausea medication as directed.  BELOW ARE SYMPTOMS THAT SHOULD BE REPORTED IMMEDIATELY: *FEVER GREATER THAN 100.4 F (38 C) OR HIGHER *CHILLS OR SWEATING *NAUSEA AND VOMITING THAT IS NOT CONTROLLED WITH YOUR NAUSEA MEDICATION *UNUSUAL SHORTNESS OF BREATH *UNUSUAL BRUISING OR BLEEDING *URINARY PROBLEMS (pain or burning when urinating, or frequent urination) *BOWEL PROBLEMS (unusual diarrhea, constipation, pain near the anus) TENDERNESS IN MOUTH AND THROAT WITH OR WITHOUT PRESENCE OF ULCERS (sore throat, sores in mouth, or a toothache) UNUSUAL RASH, SWELLING OR PAIN  UNUSUAL VAGINAL DISCHARGE OR ITCHING   Items with * indicate a potential emergency and should be followed up as soon as possible or go to the Emergency Department if any problems should occur.  Please  show the CHEMOTHERAPY ALERT CARD or IMMUNOTHERAPY ALERT CARD at check-in to the Emergency Department and triage nurse.  Should you have questions after your visit or need to cancel or reschedule your appointment, please contact [facility]  Dept: [phone]  and follow the prompts.  Office hours are 8:00 a.m. to 4:30 p.m. Monday - Friday. Please note that voicemails left after 4:00 p.m. may not be returned until the following business day.  We are closed weekends and major holidays. You have access to a nurse at all times for urgent questions. Please call the main number to the clinic Dept: [phone] and follow the prompts.   For any non-urgent questions, you may also contact your provider using MyChart. We now offer e-Visits for anyone 78 and older to request care online for non-urgent symptoms. For details visit [email or web address].   Also download the MyChart app! Go to the app store, search "MyChart", open the app, select [facility], and log in with your MyChart username and password.  Masks are optional in the cancer centers. If you would like for your care team to wear a mask while they are taking care of you, please let them know. You may have one support person who is at least 82 years old accompany you for your appointments.  Bevacizumab Injection What is this medication? BEVACIZUMAB (be va SIZ yoo mab) treats some types of cancer. It works by blocking a protein that causes cancer cells to grow and multiply. This helps to slow or stop the spread of cancer cells. It is a monoclonal antibody. This medicine may be used for  other purposes; ask your health care provider or pharmacist if you have questions. COMMON BRAND NAME(S): Alymsys, Avastin, MVASI, Noah Charon What should I tell my care team before I take this medication? They need to know if you have any of these conditions: Blood clots Coughing up blood Having or recent surgery Heart failure High blood  pressure History of a connection between 2 or more body parts that do not usually connect (fistula) History of a tear in your stomach or intestines Protein in your urine An unusual or allergic reaction to bevacizumab, other medications, foods, dyes, or preservatives Pregnant or trying to get pregnant Breast-feeding How should I use this medication? This medication is injected into a vein. It is given by your care team in a hospital or clinic setting. Talk to your care team the use of this medication in children. Special care may be needed. Overdosage: If you think you have taken too much of this medicine contact a poison control center or emergency room at once. NOTE: This medicine is only for you. Do not share this medicine with others. What if I miss a dose? Keep appointments for follow-up doses. It is important not to miss your dose. Call your care team if you are unable to keep an appointment. What may interact with this medication? Interactions are not expected. This list may not describe all possible interactions. Give your health care provider a list of all the medicines, herbs, non-prescription drugs, or dietary supplements you use. Also tell them if you smoke, drink alcohol, or use illegal drugs. Some items may interact with your medicine. What should I watch for while using this medication? Your condition will be monitored carefully while you are receiving this medication. You may need blood work while taking this medication. This medication may make you feel generally unwell. This is not uncommon as chemotherapy can affect healthy cells as well as cancer cells. Report any side effects. Continue your course of treatment even though you feel ill unless your care team tells you to stop. This medication may increase your risk to bruise or bleed. Call your care team if you notice any unusual bleeding. Before having surgery, talk to your care team to make sure it is ok. This medication can  increase the risk of poor healing of your surgical site or wound. You will need to stop this medication for 28 days before surgery. After surgery, wait at least 28 days before restarting this medication. Make sure the surgical site or wound is healed enough before restarting this medication. Talk to your care team if questions. Talk to your care team if you may be pregnant. Serious birth defects can occur if you take this medication during pregnancy and for 6 months after the last dose. Contraception is recommended while taking this medication and for 6 months after the last dose. Your care team can help you find the option that works for you. Do not breastfeed while taking this medication and for 6 months after the last dose. This medication can cause infertility. Talk to your care team if you are concerned about your fertility. What side effects may I notice from receiving this medication? Side effects that you should report to your care team as soon as possible: Allergic reactions--skin rash, itching, hives, swelling of the face, lips, tongue, or throat Bleeding--bloody or black, tar-like stools, vomiting blood or brown material that looks like coffee grounds, red or dark brown urine, small red or purple spots on skin, unusual bruising or bleeding  Blood clot--pain, swelling, or warmth in the leg, shortness of breath, chest pain Heart attack--pain or tightness in the chest, shoulders, arms, or jaw, nausea, shortness of breath, cold or clammy skin, feeling faint or lightheaded Heart failure--shortness of breath, swelling of the ankles, feet, or hands, sudden weight gain, unusual weakness or fatigue Increase in blood pressure Infection--fever, chills, cough, sore throat, wounds that don't heal, pain or trouble when passing urine, general feeling of discomfort or being unwell Infusion reactions--chest pain, shortness of breath or trouble breathing, feeling faint or lightheaded Kidney injury--decrease in  the amount of urine, swelling of the ankles, hands, or feet Stomach pain that is severe, does not go away, or gets worse Stroke--sudden numbness or weakness of the face, arm, or leg, trouble speaking, confusion, trouble walking, loss of balance or coordination, dizziness, severe headache, change in vision Sudden and severe headache, confusion, change in vision, seizures, which may be signs of posterior reversible encephalopathy syndrome (PRES) Side effects that usually do not require medical attention (report to your care team if they continue or are bothersome): Back pain Change in taste Diarrhea Dry skin Increased tears Nosebleed This list may not describe all possible side effects. Call your doctor for medical advice about side effects. You may report side effects to FDA at 1-800-FDA-1088. Where should I keep my medication? This medication is given in a hospital or clinic. It will not be stored at home. NOTE: This sheet is a summary. It may not cover all possible information. If you have questions about this medicine, talk to your doctor, pharmacist, or health care provider.  2023 Elsevier/Gold Standard (2022-01-11 00:00:00)  Irinotecan Injection What is this medication? IRINOTECAN (ir in oh TEE kan) treats some types of cancer. It works by slowing down the growth of cancer cells. This medicine may be used for other purposes; ask your health care provider or pharmacist if you have questions. COMMON BRAND NAME(S): Camptosar What should I tell my care team before I take this medication? They need to know if you have any of these conditions: Dehydration Diarrhea Infection, especially a viral infection, such as chickenpox, cold sores, herpes Liver disease Low blood cell levels (white cells, red cells, and platelets) Low levels of electrolytes, such as calcium, magnesium, or potassium in your blood Recent or ongoing radiation An unusual or allergic reaction to irinotecan, other  medications, foods, dyes, or preservatives If you or your partner are pregnant or trying to get pregnant Breast-feeding How should I use this medication? This medication is injected into a vein. It is given by your care team in a hospital or clinic setting. Talk to your care team about the use of this medication in children. Special care may be needed. Overdosage: If you think you have taken too much of this medicine contact a poison control center or emergency room at once. NOTE: This medicine is only for you. Do not share this medicine with others. What if I miss a dose? Keep appointments for follow-up doses. It is important not to miss your dose. Call your care team if you are unable to keep an appointment. What may interact with this medication? Do not take this medication with any of the following: Cobicistat Itraconazole This medication may also interact with the following: Certain antibiotics, such as clarithromycin, rifampin, rifabutin Certain antivirals for HIV or AIDS Certain medications for fungal infections, such as ketoconazole, posaconazole, voriconazole Certain medications for seizures, such as carbamazepine, phenobarbital, phenytoin Gemfibrozil Nefazodone St. John's wort This list may  not describe all possible interactions. Give your health care provider a list of all the medicines, herbs, non-prescription drugs, or dietary supplements you use. Also tell them if you smoke, drink alcohol, or use illegal drugs. Some items may interact with your medicine. What should I watch for while using this medication? Your condition will be monitored carefully while you are receiving this medication. You may need blood work while taking this medication. This medication may make you feel generally unwell. This is not uncommon as chemotherapy can affect healthy cells as well as cancer cells. Report any side effects. Continue your course of treatment even though you feel ill unless your care  team tells you to stop. This medication can cause serious side effects. To reduce the risk, your care team may give you other medications to take before receiving this one. Be sure to follow the directions from your care team. This medication may affect your coordination, reaction time, or judgement. Do not drive or operate machinery until you know how this medication affects you. Sit up or stand slowly to reduce the risk of dizzy or fainting spells. Drinking alcohol with this medication can increase the risk of these side effects. This medication may increase your risk of getting an infection. Call your care team for advice if you get a fever, chills, sore throat, or other symptoms of a cold or flu. Do not treat yourself. Try to avoid being around people who are sick. Avoid taking medications that contain aspirin, acetaminophen, ibuprofen, naproxen, or ketoprofen unless instructed by your care team. These medications may hide a fever. This medication may increase your risk to bruise or bleed. Call your care team if you notice any unusual bleeding. Be careful brushing or flossing your teeth or using a toothpick because you may get an infection or bleed more easily. If you have any dental work done, tell your dentist you are receiving this medication. Talk to your care team if you or your partner are pregnant or think either of you might be pregnant. This medication can cause serious birth defects if taken during pregnancy and for 6 months after the last dose. You will need a negative pregnancy test before starting this medication. Contraception is recommended while taking this medication and for 6 months after the last dose. Your care team can help you find the option that works for you. Do not father a child while taking this medication and for 3 months after the last dose. Use a condom for contraception during this time period. Do not breastfeed while taking this medication and for 7 days after the last  dose. This medication may cause infertility. Talk to your care team if you are concerned about your fertility. What side effects may I notice from receiving this medication? Side effects that you should report to your care team as soon as possible: Allergic reactions--skin rash, itching, hives, swelling of the face, lips, tongue, or throat Dry cough, shortness of breath or trouble breathing Increased saliva or tears, increased sweating, stomach cramping, diarrhea, small pupils, unusual weakness or fatigue, slow heartbeat Infection--fever, chills, cough, sore throat, wounds that don't heal, pain or trouble when passing urine, general feeling of discomfort or being unwell Kidney injury--decrease in the amount of urine, swelling of the ankles, hands, or feet Low red blood cell level--unusual weakness or fatigue, dizziness, headache, trouble breathing Severe or prolonged diarrhea Unusual bruising or bleeding Side effects that usually do not require medical attention (report to your care team if they  continue or are bothersome): Constipation Diarrhea Hair loss Loss of appetite Nausea Stomach pain This list may not describe all possible side effects. Call your doctor for medical advice about side effects. You may report side effects to FDA at 1-800-FDA-1088. Where should I keep my medication? This medication is given in a hospital or clinic. It will not be stored at home. NOTE: This sheet is a summary. It may not cover all possible information. If you have questions about this medicine, talk to your doctor, pharmacist, or health care provider.  2023 Elsevier/Gold Standard (2022-01-17 00:00:00)  Leucovorin Injection What is this medication? LEUCOVORIN (loo koe VOR in) prevents side effects from certain medications, such as methotrexate. It works by increasing folate levels. This helps protect healthy cells in your body. It may also be used to treat anemia caused by low levels of folate. It can  also be used with fluorouracil, a type of chemotherapy, to treat colorectal cancer. It works by increasing the effects of fluorouracil in the body. This medicine may be used for other purposes; ask your health care provider or pharmacist if you have questions. What should I tell my care team before I take this medication? They need to know if you have any of these conditions: Anemia from low levels of vitamin B12 in the blood An unusual or allergic reaction to leucovorin, folic acid, other medications, foods, dyes, or preservatives Pregnant or trying to get pregnant Breastfeeding How should I use this medication? This medication is injected into a vein or a muscle. It is given by your care team in a hospital or clinic setting. Talk to your care team about the use of this medication in children. Special care may be needed. Overdosage: If you think you have taken too much of this medicine contact a poison control center or emergency room at once. NOTE: This medicine is only for you. Do not share this medicine with others. What if I miss a dose? Keep appointments for follow-up doses. It is important not to miss your dose. Call your care team if you are unable to keep an appointment. What may interact with this medication? Capecitabine Fluorouracil Phenobarbital Phenytoin Primidone Trimethoprim;sulfamethoxazole This list may not describe all possible interactions. Give your health care provider a list of all the medicines, herbs, non-prescription drugs, or dietary supplements you use. Also tell them if you smoke, drink alcohol, or use illegal drugs. Some items may interact with your medicine. What should I watch for while using this medication? Your condition will be monitored carefully while you are receiving this medication. This medication may increase the side effects of 5-fluorouracil. Tell your care team if you have diarrhea or mouth sores that do not get better or that get worse. What  side effects may I notice from receiving this medication? Side effects that you should report to your care team as soon as possible: Allergic reactions--skin rash, itching, hives, swelling of the face, lips, tongue, or throat This list may not describe all possible side effects. Call your doctor for medical advice about side effects. You may report side effects to FDA at 1-800-FDA-1088. Where should I keep my medication? This medication is given in a hospital or clinic. It will not be stored at home. NOTE: This sheet is a summary. It may not cover all possible information. If you have questions about this medicine, talk to your doctor, pharmacist, or health care provider.  2023 Elsevier/Gold Standard (2022-02-12 00:00:00)  Fluorouracil Injection What is this medication? FLUOROURACIL (  flure oh YOOR a sil) treats some types of cancer. It works by slowing down the growth of cancer cells. This medicine may be used for other purposes; ask your health care provider or pharmacist if you have questions. COMMON BRAND NAME(S): Adrucil What should I tell my care team before I take this medication? They need to know if you have any of these conditions: Blood disorders Dihydropyrimidine dehydrogenase (DPD) deficiency Infection, such as chickenpox, cold sores, herpes Kidney disease Liver disease Poor nutrition Recent or ongoing radiation therapy An unusual or allergic reaction to fluorouracil, other medications, foods, dyes, or preservatives If you or your partner are pregnant or trying to get pregnant Breast-feeding How should I use this medication? This medication is injected into a vein. It is administered by your care team in a hospital or clinic setting. Talk to your care team about the use of this medication in children. Special care may be needed. Overdosage: If you think you have taken too much of this medicine contact a poison control center or emergency room at once. NOTE: This medicine is  only for you. Do not share this medicine with others. What if I miss a dose? Keep appointments for follow-up doses. It is important not to miss your dose. Call your care team if you are unable to keep an appointment. What may interact with this medication? Do not take this medication with any of the following: Live virus vaccines This medication may also interact with the following: Medications that treat or prevent blood clots, such as warfarin, enoxaparin, dalteparin This list may not describe all possible interactions. Give your health care provider a list of all the medicines, herbs, non-prescription drugs, or dietary supplements you use. Also tell them if you smoke, drink alcohol, or use illegal drugs. Some items may interact with your medicine. What should I watch for while using this medication? Your condition will be monitored carefully while you are receiving this medication. This medication may make you feel generally unwell. This is not uncommon as chemotherapy can affect healthy cells as well as cancer cells. Report any side effects. Continue your course of treatment even though you feel ill unless your care team tells you to stop. In some cases, you may be given additional medications to help with side effects. Follow all directions for their use. This medication may increase your risk of getting an infection. Call your care team for advice if you get a fever, chills, sore throat, or other symptoms of a cold or flu. Do not treat yourself. Try to avoid being around people who are sick. This medication may increase your risk to bruise or bleed. Call your care team if you notice any unusual bleeding. Be careful brushing or flossing your teeth or using a toothpick because you may get an infection or bleed more easily. If you have any dental work done, tell your dentist you are receiving this medication. Avoid taking medications that contain aspirin, acetaminophen, ibuprofen, naproxen, or  ketoprofen unless instructed by your care team. These medications may hide a fever. Do not treat diarrhea with over the counter products. Contact your care team if you have diarrhea that lasts more than 2 days or if it is severe and watery. This medication can make you more sensitive to the sun. Keep out of the sun. If you cannot avoid being in the sun, wear protective clothing and sunscreen. Do not use sun lamps, tanning beds, or tanning booths. Talk to your care team if you or  your partner wish to become pregnant or think you might be pregnant. This medication can cause serious birth defects if taken during pregnancy and for 3 months after the last dose. A reliable form of contraception is recommended while taking this medication and for 3 months after the last dose. Talk to your care team about effective forms of contraception. Do not father a child while taking this medication and for 3 months after the last dose. Use a condom while having sex during this time period. Do not breastfeed while taking this medication. This medication may cause infertility. Talk to your care team if you are concerned about your fertility. What side effects may I notice from receiving this medication? Side effects that you should report to your care team as soon as possible: Allergic reactions--skin rash, itching, hives, swelling of the face, lips, tongue, or throat Heart attack--pain or tightness in the chest, shoulders, arms, or jaw, nausea, shortness of breath, cold or clammy skin, feeling faint or lightheaded Heart failure--shortness of breath, swelling of the ankles, feet, or hands, sudden weight gain, unusual weakness or fatigue Heart rhythm changes--fast or irregular heartbeat, dizziness, feeling faint or lightheaded, chest pain, trouble breathing High ammonia level--unusual weakness or fatigue, confusion, loss of appetite, nausea, vomiting, seizures Infection--fever, chills, cough, sore throat, wounds that don't  heal, pain or trouble when passing urine, general feeling of discomfort or being unwell Low red blood cell level--unusual weakness or fatigue, dizziness, headache, trouble breathing Pain, tingling, or numbness in the hands or feet, muscle weakness, change in vision, confusion or trouble speaking, loss of balance or coordination, trouble walking, seizures Redness, swelling, and blistering of the skin over hands and feet Severe or prolonged diarrhea Unusual bruising or bleeding Side effects that usually do not require medical attention (report to your care team if they continue or are bothersome): Dry skin Headache Increased tears Nausea Pain, redness, or swelling with sores inside the mouth or throat Sensitivity to light Vomiting This list may not describe all possible side effects. Call your doctor for medical advice about side effects. You may report side effects to FDA at 1-800-FDA-1088. Where should I keep my medication? This medication is given in a hospital or clinic. It will not be stored at home. NOTE: This sheet is a summary. It may not cover all possible information. If you have questions about this medicine, talk to your doctor, pharmacist, or health care provider.  2023 Elsevier/Gold Standard (2022-01-08 00:00:00)  The chemotherapy medication bag should finish at 46 hours, 96 hours, or 7 days. For example, if your pump is scheduled for 46 hours and it was put on at 4:00 p.m., it should finish at 2:00 p.m. the day it is scheduled to come off regardless of your appointment time.     Estimated time to finish at 12:15pm on Friday December 1st, 2023.   If the display on your pump reads "Low Volume" and it is beeping, take the batteries out of the pump and come to the cancer center for it to be taken off.   If the pump alarms go off prior to the pump reading "Low Volume" then call [phone] and someone can assist you.  If the plunger comes out and the chemotherapy medication is  leaking out, please use your home chemo spill kit to clean up the spill. Do NOT use paper towels or other household products.  If you have problems or questions regarding your pump, please call either [phone] (24 hours a day) or  the cancer center Monday-Friday 8:00 a.m.- 4:30 p.m. at the clinic number and we will assist you. If you are unable to get assistance, then go to the nearest Emergency Department and ask the staff to contact the IV team for assistance.

## 2022-08-21 NOTE — Progress Notes (Signed)
Patient seen by Lisa Thomas NP today  Vitals are within treatment parameters.  Labs reviewed by Lisa Thomas NP and are within treatment parameters.  Per physician team, patient is ready for treatment and there are NO modifications to the treatment plan.     

## 2022-08-21 NOTE — Patient Instructions (Signed)

## 2022-08-21 NOTE — Progress Notes (Signed)
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  Weight documented as 152 pounds on November 29, decreased from 155 pounds on November 13.  Labs include Glucose 106.  Patient reports he is well overall.  He has ~ 1-8 formed stools daily. Denies diarrhea, constipation, nausea and vomiting. He drinks one bottle of original Boost most days, usually if he is on the go and unable to eat a meal.  Biggest complaint is fatigue.  He used to use Enterade and wonders if he should resume/if it could be helpful.  Nutrition Diagnosis:  Unintended wt loss continues.  Intervention: Educated to change from [employer] to Boost Plus to provide additional calories and protein. Drink one carton daily. Educated on [place]. Product may be useful. Trial for 2 weeks and assess results. Reviewed soft, moist easy to tolerated foods to incorporate. Gave contact information for questions.  Monitor, Evaluation, Goals: Patient will increase calories and protein to minimize wt loss.  Next Visit: To be scheduled as needed.

## 2022-08-21 NOTE — Progress Notes (Signed)
[facility] OFFICE PROGRESS NOTE   Diagnosis: Colon cancer  INTERVAL HISTORY:   [name] returns as scheduled.  He completed cycle 2 FOLFIRI/bevacizumab 08/05/2022.  He denies nausea/vomiting.  No mouth sores.  No diarrhea.  He continues to have multiple formed stools a day.  He estimates as few as 1/day and as many as 8/day.  The volume varies.  He occasionally notes a small amount of blood on the toilet tissue.  He attributes this to a hemorrhoid.  Fatigue is his main complaint.  Objective:  Vital signs in last 24 hours:  Blood pressure (!) 142/80, pulse 100, temperature 98.1 F (36.7 C), temperature source Oral, resp. rate 20, height _0  (1.753 m), weight 152 lb 12.8 oz (69.3 kg), SpO2 100 %.    HEENT: No thrush or ulcers. Resp: Lungs clear bilaterally. Cardio: Regular rate and rhythm. GI: Abdomen soft and nontender.  No hepatomegaly. Vascular: No leg edema. Neuro: Alert and oriented. Skin: Palms without erythema. Port-A-Cath without erythema.   Lab Results:  Lab Results  Component Value Date   WBC 5.6 08/21/2022   HGB 12.9 (L) 08/21/2022   HCT 38.3 (L) 08/21/2022   MCV 82.2 08/21/2022   PLT 266 08/21/2022   NEUTROABS 4.5 08/21/2022    Imaging:  No results found.  Medications: I have reviewed the patient's current medications.  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; foundation 1-microsatellite stable, tumor mutation burden 5, K-rasG 12V, NRAS wildtype Colonoscopy 05/22/2021-ulcerated partially obstructing mass at 15 cm from anal verge CTs 05/30/2021-numerous small pulmonary nodules concerning for metastases, thickening  of the rectum, multiple liver metastases Cycle 1 FOLFOX 06/12/2021 Cycle 2 FOLFOX 06/26/2021 Cycle 3 FOLFOX 07/10/2021, oxaliplatin dose reduced due to progressive decline in the [place] and platelet count Cycle 4 FOLFOX 07/24/2021 Cycle 5 FOLFOX 08/07/2021, 5-FU bolus eliminated and oxaliplatin dose reduced, insurance would not approve Udenyca CTs 08/15/2021-decrease size of lung nodules, decreased hepatic metastases, persistent anorectal wall thickening, stable right renal mass Cycle 6 FOLFOX 08/21/2021, 5-FU bolus and 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 Cycle 7 FOLFOX 09/04/2021, 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 Cycle 8 FOLFOX 09/18/2021, 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 Cycle 9 FOLFOX 10/08/2021, 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 Cycle 10 FOLFOX 10/23/2021, 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 CTs 11/01/2021-decrease in size of occasional small pulmonary nodules, continued decrease in size of multiple hypoenhancing hepatic metastases, unchanged posttreatment appearance of the low rectum, unchanged exophytic mass of the inferior pole of the right kidney measuring 1.5 x 1.3 cm Cycle 11 5-fluorouracil 11/06/2021 Cycle 12 5-fluorouracil 11/20/2021 Cycle 13 5-fluorouracil 12/04/2021 Maintenance Xeloda beginning 12/19/2021 Xeloda dose reduced to 1000 mg twice daily beginning 12/27/2021 Xeloda placed on hold 01/16/2022 CTs 01/22/2022-segment of distal ileum with circumferential wall thickening and perienteric inflammation, decrease in size of liver metastases, stable tiny bilateral pulmonary nodules, circumferential wall thickening in the rectum, stable lower pole right kidney lesion suspicious for renal cell carcinoma Xeloda discontinued due to diarrhea 5-fluorouracil pump 02/05/2022 5-fluorouracil pump 02/26/2022 Treatment held 03/18/2022 due to diarrhea 5-fluorouracil pump 03/25/2022, dose reduced due to diarrhea 5-fluorouracil  pump 04/16/2022 CTs 05/03/2022-unchanged tiny pulmonary nodules, unchanged liver metastases, stable appearance of the low rectal wall thickening, unchanged posterior right renal mass 5-fluorouracil pump 05/07/2022  5-fluorouracil pump 05/28/2022 5-fluorouracil pump 06/18/2022 CTs 07/04/2022-increased size of liver metastases, no new lesions, new 3 mm right lower lobe nodule-nonspecific Cycle 1 FOLFIRI/bevacizumab 07/16/2022 Cycle 2 FOLFIRI/bevacizumab 08/05/2022; 5-FU bolus eliminated, irinotecan dose reduced due to diarrhea and weight loss Cycle 3 FOLFIRI/bevacizumab 08/21/2022 Prostate cancer-simple prostatectomy January 2019, Gleason 3+3, 10 to 12% of specimen Active surveillance, biopsy May 2019 with small focus of Gleason 3+3 disease in 1/6 cores, surveillance continued Elevated PSA 04/20/2020 Biopsy 06/19/2020 8/12 cores positive, Gleason 4+4 PET scan 07/07/2020-negative CT 07/07/2020 right iliac and retrocaval adenopathy, 1.3 cm subcapsular right lower pole renal lesion Androgen deprivation therapy beginning 08/21/2020 Radiation to prostate, seminal vesicles, and pelvic lymph nodes to 10/22 - 12/28/2020 He continues every 74-monthFirmagon and daily Xtandi 08/21/2022 patient reports Xtandi discontinued   3.  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  Mitral valve prolapse 5.  Family history of prostate cancer 6.  08/15/2021 with leg weakness and an episode of slurred speech-TIA?,  Brain imaging negative 7.  Diarrhea and fatigue while on capecitabine April 2023-capecitabine discontinued 01/18/2022  Disposition: Mr. RKlausingappears stable.  He noted improved tolerance of cycle 2 FOLFIRI/bevacizumab in terms of diarrhea.  Plan to proceed with cycle 3 today as scheduled.  CBC reviewed.  Counts adequate to proceed with treatment.  He will return for lab, follow-up, chemotherapy in 2 weeks.  We are available to see him sooner  if needed.    LNed CardANP/GNP-BC   08/21/2022  10:34 AM

## 2022-08-23 ENCOUNTER — Inpatient Hospital Stay: Payer: Medicare Other | Attending: Nurse Practitioner

## 2022-08-23 VITALS — BP 126/75 | HR 87 | Temp 97.5°F | Resp 181

## 2022-08-23 DIAGNOSIS — Z5111 Encounter for antineoplastic chemotherapy: Secondary | ICD-10-CM | POA: Diagnosis present

## 2022-08-23 DIAGNOSIS — I341 Nonrheumatic mitral (valve) prolapse: Secondary | ICD-10-CM | POA: Insufficient documentation

## 2022-08-23 DIAGNOSIS — N2889 Other specified disorders of kidney and ureter: Secondary | ICD-10-CM | POA: Insufficient documentation

## 2022-08-23 DIAGNOSIS — Z8042 Family history of malignant neoplasm of prostate: Secondary | ICD-10-CM | POA: Diagnosis not present

## 2022-08-23 DIAGNOSIS — C20 Malignant neoplasm of rectum: Secondary | ICD-10-CM | POA: Insufficient documentation

## 2022-08-23 MED ORDER — HEPARIN SOD (PORK) LOCK FLUSH 100 UNIT/ML IV SOLN
500.0000 [IU] | Freq: Once | INTRAVENOUS | Status: AC | PRN
  Administered 2022-08-23: 500 [IU]

## 2022-08-23 MED ORDER — SODIUM CHLORIDE 0.9% FLUSH
10.0000 mL | INTRAVENOUS | Status: DC | PRN
  Administered 2022-08-23: 10 mL

## 2022-08-23 NOTE — Patient Instructions (Signed)

## 2022-09-01 ENCOUNTER — Other Ambulatory Visit: Payer: Self-pay | Admitting: Oncology

## 2022-09-01 DIAGNOSIS — C20 Malignant neoplasm of rectum: Secondary | ICD-10-CM

## 2022-09-03 ENCOUNTER — Inpatient Hospital Stay (HOSPITAL_BASED_OUTPATIENT_CLINIC_OR_DEPARTMENT_OTHER): Payer: Medicare Other | Admitting: Oncology

## 2022-09-03 ENCOUNTER — Encounter: Payer: Self-pay | Admitting: *Deleted

## 2022-09-03 ENCOUNTER — Inpatient Hospital Stay: Payer: Medicare Other

## 2022-09-03 VITALS — BP 160/80 | HR 66 | Temp 98.2°F | Resp 20 | Ht 69.0 in | Wt 152.6 lb

## 2022-09-03 DIAGNOSIS — Z5111 Encounter for antineoplastic chemotherapy: Secondary | ICD-10-CM | POA: Diagnosis not present

## 2022-09-03 DIAGNOSIS — C20 Malignant neoplasm of rectum: Secondary | ICD-10-CM

## 2022-09-03 LAB — CMP (CANCER CENTER ONLY)
ALT: 10 U/L (ref 0–44)
AST: 12 U/L — ABNORMAL LOW (ref 15–41)
Albumin: 4.1 g/dL (ref 3.5–5.0)
Alkaline Phosphatase: 78 U/L (ref 38–126)
Anion gap: 11 (ref 5–15)
BUN: 14 mg/dL (ref 8–23)
CO2: 22 mmol/L (ref 22–32)
Calcium: 9.3 mg/dL (ref 8.9–10.3)
Chloride: 106 mmol/L (ref 98–111)
Creatinine: 0.95 mg/dL (ref 0.61–1.24)
GFR, Estimated: >60 mL/min (ref >60)
Glucose, Bld: 102 mg/dL — ABNORMAL HIGH (ref 70–99)
Potassium: 3.9 mmol/L (ref 3.5–5.1)
Sodium: 139 mmol/L (ref 135–145)
Total Bilirubin: 0.6 mg/dL (ref 0.3–1.2)
Total Protein: 6.5 g/dL (ref 6.5–8.1)

## 2022-09-03 LAB — CBC WITH DIFFERENTIAL (CANCER CENTER ONLY)
Abs Immature Granulocytes: 0.02 10*3/uL (ref 0.00–0.07)
Basophils Absolute: 0 10*3/uL (ref 0.0–0.1)
Basophils Relative: 0 %
Eosinophils Absolute: 0.2 10*3/uL (ref 0.0–0.5)
Eosinophils Relative: 3 %
HCT: 39.1 % (ref 39.0–52.0)
Hemoglobin: 13.2 g/dL (ref 13.0–17.0)
Immature Granulocytes: 0 %
Lymphocytes Relative: 14 %
Lymphs Abs: 0.8 10*3/uL (ref 0.7–4.0)
MCH: 27.3 pg (ref 26.0–34.0)
MCHC: 33.8 g/dL (ref 30.0–36.0)
MCV: 81 fL (ref 80.0–100.0)
Monocytes Absolute: 0.4 10*3/uL (ref 0.1–1.0)
Monocytes Relative: 7 %
Neutro Abs: 4 10*3/uL (ref 1.7–7.7)
Neutrophils Relative %: 76 %
Platelet Count: 272 10*3/uL (ref 150–400)
RBC: 4.83 MIL/uL (ref 4.22–5.81)
RDW: 15.8 % — ABNORMAL HIGH (ref 11.5–15.5)
WBC Count: 5.3 10*3/uL (ref 4.0–10.5)
nRBC: 0 % (ref 0.0–0.2)

## 2022-09-03 LAB — CEA (ACCESS): CEA (CHCC): 105.65 ng/mL — ABNORMAL HIGH (ref 0.00–5.00)

## 2022-09-03 MED ORDER — SODIUM CHLORIDE 0.9 % IV SOLN
10.0000 mg | Freq: Once | INTRAVENOUS | Status: AC
  Administered 2022-09-03: 10 mg via INTRAVENOUS
  Filled 2022-09-03: qty 10

## 2022-09-03 MED ORDER — SODIUM CHLORIDE 0.9 % IV SOLN
120.0000 mg/m2 | Freq: Once | INTRAVENOUS | Status: AC
  Administered 2022-09-03: 220 mg via INTRAVENOUS
  Filled 2022-09-03: qty 6

## 2022-09-03 MED ORDER — SODIUM CHLORIDE 0.9 % IV SOLN
400.0000 mg/m2 | Freq: Once | INTRAVENOUS | Status: AC
  Administered 2022-09-03: 740 mg via INTRAVENOUS
  Filled 2022-09-03: qty 37

## 2022-09-03 MED ORDER — SODIUM CHLORIDE 0.9 % IV SOLN: Freq: Once | INTRAVENOUS | Status: AC

## 2022-09-03 MED ORDER — ATROPINE SULFATE 1 MG/ML IV SOLN
0.5000 mg | Freq: Once | INTRAVENOUS | Status: AC | PRN
  Administered 2022-09-03: 0.5 mg via INTRAVENOUS
  Filled 2022-09-03: qty 1

## 2022-09-03 MED ORDER — PALONOSETRON HCL INJECTION 0.25 MG/5ML
0.2500 mg | Freq: Once | INTRAVENOUS | Status: AC
  Administered 2022-09-03: 0.25 mg via INTRAVENOUS
  Filled 2022-09-03: qty 5

## 2022-09-03 MED ORDER — SODIUM CHLORIDE 0.9 % IV SOLN
5.0000 mg/kg | Freq: Once | INTRAVENOUS | Status: AC
  Administered 2022-09-03: 350 mg via INTRAVENOUS
  Filled 2022-09-03: qty 14

## 2022-09-03 MED ORDER — SODIUM CHLORIDE 0.9 % IV SOLN
1600.0000 mg/m2 | INTRAVENOUS | Status: DC
  Administered 2022-09-03: 2950 mg via INTRAVENOUS
  Filled 2022-09-03: qty 59

## 2022-09-03 NOTE — Progress Notes (Signed)
Per Dr. [name]: OK to treat w/urine protein from 08/21/22. Will re-check at next treatment.

## 2022-09-03 NOTE — Patient Instructions (Signed)
[facility]   Discharge Instructions: Thank you for choosing [facility] to provide your oncology and hematology care.   If you have a lab appointment with the [facility], please go directly to the [facility] and check in at the registration area.   Wear comfortable clothing and clothing appropriate for easy access to any Portacath or PICC line.   We strive to give you quality time with your provider. You may need to reschedule your appointment if you arrive late (15 or more minutes).  Arriving late affects you and other patients whose appointments are after yours.  Also, if you miss three or more appointments without notifying the office, you may be dismissed from the clinic at the provider's discretion.      For prescription refill requests, have your pharmacy contact our office and allow 72 hours for refills to be completed.    Today you received the following chemotherapy and/or immunotherapy agents Bevacizumab-adcd (VEGZELMA), Irinotecan (CAMPTOSAR), Leucovorin & Flourouracil (ADRUCIL).      To help prevent nausea and vomiting after your treatment, we encourage you to take your nausea medication as directed.  BELOW ARE SYMPTOMS THAT SHOULD BE REPORTED IMMEDIATELY: *FEVER GREATER THAN 100.4 F (38 C) OR HIGHER *CHILLS OR SWEATING *NAUSEA AND VOMITING THAT IS NOT CONTROLLED WITH YOUR NAUSEA MEDICATION *UNUSUAL SHORTNESS OF BREATH *UNUSUAL BRUISING OR BLEEDING *URINARY PROBLEMS (pain or burning when urinating, or frequent urination) *BOWEL PROBLEMS (unusual diarrhea, constipation, pain near the anus) TENDERNESS IN MOUTH AND THROAT WITH OR WITHOUT PRESENCE OF ULCERS (sore throat, sores in mouth, or a toothache) UNUSUAL RASH, SWELLING OR PAIN  UNUSUAL VAGINAL DISCHARGE OR ITCHING   Items with * indicate a potential emergency and should be followed up as soon as possible or go to the Emergency Department if any problems should  occur.  Please show the CHEMOTHERAPY ALERT CARD or IMMUNOTHERAPY ALERT CARD at check-in to the Emergency Department and triage nurse.  Should you have questions after your visit or need to cancel or reschedule your appointment, please contact [facility]  Dept: [phone]  and follow the prompts.  Office hours are 8:00 a.m. to 4:30 p.m. Monday - Friday. Please note that voicemails left after 4:00 p.m. may not be returned until the following business day.  We are closed weekends and major holidays. You have access to a nurse at all times for urgent questions. Please call the main number to the clinic Dept: [phone] and follow the prompts.   For any non-urgent questions, you may also contact your provider using MyChart. We now offer e-Visits for anyone 65 and older to request care online for non-urgent symptoms. For details visit [email or web address].   Also download the MyChart app! Go to the app store, search "MyChart", open the app, select [facility], and log in with your MyChart username and password.  Masks are optional in the cancer centers. If you would like for your care team to wear a mask while they are taking care of you, please let them know. You may have one support person who is at least 82 years old accompany you for your appointments.  Bevacizumab Injection What is this medication? BEVACIZUMAB (be va SIZ yoo mab) treats some types of cancer. It works by blocking a protein that causes cancer cells to grow and multiply. This helps to slow or stop the spread of cancer cells. It is a monoclonal antibody. This medicine may be used for  other purposes; ask your health care provider or pharmacist if you have questions. COMMON BRAND NAME(S): Alymsys, Avastin, MVASI, Noah Charon What should I tell my care team before I take this medication? They need to know if you have any of these conditions: Blood clots Coughing up blood Having or recent surgery Heart  failure High blood pressure History of a connection between 2 or more body parts that do not usually connect (fistula) History of a tear in your stomach or intestines Protein in your urine An unusual or allergic reaction to bevacizumab, other medications, foods, dyes, or preservatives Pregnant or trying to get pregnant Breast-feeding How should I use this medication? This medication is injected into a vein. It is given by your care team in a hospital or clinic setting. Talk to your care team the use of this medication in children. Special care may be needed. Overdosage: If you think you have taken too much of this medicine contact a poison control center or emergency room at once. NOTE: This medicine is only for you. Do not share this medicine with others. What if I miss a dose? Keep appointments for follow-up doses. It is important not to miss your dose. Call your care team if you are unable to keep an appointment. What may interact with this medication? Interactions are not expected. This list may not describe all possible interactions. Give your health care provider a list of all the medicines, herbs, non-prescription drugs, or dietary supplements you use. Also tell them if you smoke, drink alcohol, or use illegal drugs. Some items may interact with your medicine. What should I watch for while using this medication? Your condition will be monitored carefully while you are receiving this medication. You may need blood work while taking this medication. This medication may make you feel generally unwell. This is not uncommon as chemotherapy can affect healthy cells as well as cancer cells. Report any side effects. Continue your course of treatment even though you feel ill unless your care team tells you to stop. This medication may increase your risk to bruise or bleed. Call your care team if you notice any unusual bleeding. Before having surgery, talk to your care team to make sure it is ok.  This medication can increase the risk of poor healing of your surgical site or wound. You will need to stop this medication for 28 days before surgery. After surgery, wait at least 28 days before restarting this medication. Make sure the surgical site or wound is healed enough before restarting this medication. Talk to your care team if questions. Talk to your care team if you may be pregnant. Serious birth defects can occur if you take this medication during pregnancy and for 6 months after the last dose. Contraception is recommended while taking this medication and for 6 months after the last dose. Your care team can help you find the option that works for you. Do not breastfeed while taking this medication and for 6 months after the last dose. This medication can cause infertility. Talk to your care team if you are concerned about your fertility. What side effects may I notice from receiving this medication? Side effects that you should report to your care team as soon as possible: Allergic reactions--skin rash, itching, hives, swelling of the face, lips, tongue, or throat Bleeding--bloody or black, tar-like stools, vomiting blood or brown material that looks like coffee grounds, red or dark brown urine, small red or purple spots on skin, unusual bruising or bleeding  Blood clot--pain, swelling, or warmth in the leg, shortness of breath, chest pain Heart attack--pain or tightness in the chest, shoulders, arms, or jaw, nausea, shortness of breath, cold or clammy skin, feeling faint or lightheaded Heart failure--shortness of breath, swelling of the ankles, feet, or hands, sudden weight gain, unusual weakness or fatigue Increase in blood pressure Infection--fever, chills, cough, sore throat, wounds that don't heal, pain or trouble when passing urine, general feeling of discomfort or being unwell Infusion reactions--chest pain, shortness of breath or trouble breathing, feeling faint or lightheaded Kidney  injury--decrease in the amount of urine, swelling of the ankles, hands, or feet Stomach pain that is severe, does not go away, or gets worse Stroke--sudden numbness or weakness of the face, arm, or leg, trouble speaking, confusion, trouble walking, loss of balance or coordination, dizziness, severe headache, change in vision Sudden and severe headache, confusion, change in vision, seizures, which may be signs of posterior reversible encephalopathy syndrome (PRES) Side effects that usually do not require medical attention (report to your care team if they continue or are bothersome): Back pain Change in taste Diarrhea Dry skin Increased tears Nosebleed This list may not describe all possible side effects. Call your doctor for medical advice about side effects. You may report side effects to FDA at 1-800-FDA-1088. Where should I keep my medication? This medication is given in a hospital or clinic. It will not be stored at home. NOTE: This sheet is a summary. It may not cover all possible information. If you have questions about this medicine, talk to your doctor, pharmacist, or health care provider.  2023 Elsevier/Gold Standard (2022-01-11 00:00:00)  Irinotecan Injection What is this medication? IRINOTECAN (ir in oh TEE kan) treats some types of cancer. It works by slowing down the growth of cancer cells. This medicine may be used for other purposes; ask your health care provider or pharmacist if you have questions. COMMON BRAND NAME(S): Camptosar What should I tell my care team before I take this medication? They need to know if you have any of these conditions: Dehydration Diarrhea Infection, especially a viral infection, such as chickenpox, cold sores, herpes Liver disease Low blood cell levels (white cells, red cells, and platelets) Low levels of electrolytes, such as calcium, magnesium, or potassium in your blood Recent or ongoing radiation An unusual or allergic reaction to  irinotecan, other medications, foods, dyes, or preservatives If you or your partner are pregnant or trying to get pregnant Breast-feeding How should I use this medication? This medication is injected into a vein. It is given by your care team in a hospital or clinic setting. Talk to your care team about the use of this medication in children. Special care may be needed. Overdosage: If you think you have taken too much of this medicine contact a poison control center or emergency room at once. NOTE: This medicine is only for you. Do not share this medicine with others. What if I miss a dose? Keep appointments for follow-up doses. It is important not to miss your dose. Call your care team if you are unable to keep an appointment. What may interact with this medication? Do not take this medication with any of the following: Cobicistat Itraconazole This medication may also interact with the following: Certain antibiotics, such as clarithromycin, rifampin, rifabutin Certain antivirals for HIV or AIDS Certain medications for fungal infections, such as ketoconazole, posaconazole, voriconazole Certain medications for seizures, such as carbamazepine, phenobarbital, phenytoin Gemfibrozil Nefazodone St. John's wort This list may  not describe all possible interactions. Give your health care provider a list of all the medicines, herbs, non-prescription drugs, or dietary supplements you use. Also tell them if you smoke, drink alcohol, or use illegal drugs. Some items may interact with your medicine. What should I watch for while using this medication? Your condition will be monitored carefully while you are receiving this medication. You may need blood work while taking this medication. This medication may make you feel generally unwell. This is not uncommon as chemotherapy can affect healthy cells as well as cancer cells. Report any side effects. Continue your course of treatment even though you feel ill  unless your care team tells you to stop. This medication can cause serious side effects. To reduce the risk, your care team may give you other medications to take before receiving this one. Be sure to follow the directions from your care team. This medication may affect your coordination, reaction time, or judgement. Do not drive or operate machinery until you know how this medication affects you. Sit up or stand slowly to reduce the risk of dizzy or fainting spells. Drinking alcohol with this medication can increase the risk of these side effects. This medication may increase your risk of getting an infection. Call your care team for advice if you get a fever, chills, sore throat, or other symptoms of a cold or flu. Do not treat yourself. Try to avoid being around people who are sick. Avoid taking medications that contain aspirin, acetaminophen, ibuprofen, naproxen, or ketoprofen unless instructed by your care team. These medications may hide a fever. This medication may increase your risk to bruise or bleed. Call your care team if you notice any unusual bleeding. Be careful brushing or flossing your teeth or using a toothpick because you may get an infection or bleed more easily. If you have any dental work done, tell your dentist you are receiving this medication. Talk to your care team if you or your partner are pregnant or think either of you might be pregnant. This medication can cause serious birth defects if taken during pregnancy and for 6 months after the last dose. You will need a negative pregnancy test before starting this medication. Contraception is recommended while taking this medication and for 6 months after the last dose. Your care team can help you find the option that works for you. Do not father a child while taking this medication and for 3 months after the last dose. Use a condom for contraception during this time period. Do not breastfeed while taking this medication and for 7 days  after the last dose. This medication may cause infertility. Talk to your care team if you are concerned about your fertility. What side effects may I notice from receiving this medication? Side effects that you should report to your care team as soon as possible: Allergic reactions--skin rash, itching, hives, swelling of the face, lips, tongue, or throat Dry cough, shortness of breath or trouble breathing Increased saliva or tears, increased sweating, stomach cramping, diarrhea, small pupils, unusual weakness or fatigue, slow heartbeat Infection--fever, chills, cough, sore throat, wounds that don't heal, pain or trouble when passing urine, general feeling of discomfort or being unwell Kidney injury--decrease in the amount of urine, swelling of the ankles, hands, or feet Low red blood cell level--unusual weakness or fatigue, dizziness, headache, trouble breathing Severe or prolonged diarrhea Unusual bruising or bleeding Side effects that usually do not require medical attention (report to your care team if they  continue or are bothersome): Constipation Diarrhea Hair loss Loss of appetite Nausea Stomach pain This list may not describe all possible side effects. Call your doctor for medical advice about side effects. You may report side effects to FDA at 1-800-FDA-1088. Where should I keep my medication? This medication is given in a hospital or clinic. It will not be stored at home. NOTE: This sheet is a summary. It may not cover all possible information. If you have questions about this medicine, talk to your doctor, pharmacist, or health care provider.  2023 Elsevier/Gold Standard (2022-01-17 00:00:00)  Leucovorin Injection What is this medication? LEUCOVORIN (loo koe VOR in) prevents side effects from certain medications, such as methotrexate. It works by increasing folate levels. This helps protect healthy cells in your body. It may also be used to treat anemia caused by low levels of  folate. It can also be used with fluorouracil, a type of chemotherapy, to treat colorectal cancer. It works by increasing the effects of fluorouracil in the body. This medicine may be used for other purposes; ask your health care provider or pharmacist if you have questions. What should I tell my care team before I take this medication? They need to know if you have any of these conditions: Anemia from low levels of vitamin B12 in the blood An unusual or allergic reaction to leucovorin, folic acid, other medications, foods, dyes, or preservatives Pregnant or trying to get pregnant Breastfeeding How should I use this medication? This medication is injected into a vein or a muscle. It is given by your care team in a hospital or clinic setting. Talk to your care team about the use of this medication in children. Special care may be needed. Overdosage: If you think you have taken too much of this medicine contact a poison control center or emergency room at once. NOTE: This medicine is only for you. Do not share this medicine with others. What if I miss a dose? Keep appointments for follow-up doses. It is important not to miss your dose. Call your care team if you are unable to keep an appointment. What may interact with this medication? Capecitabine Fluorouracil Phenobarbital Phenytoin Primidone Trimethoprim;sulfamethoxazole This list may not describe all possible interactions. Give your health care provider a list of all the medicines, herbs, non-prescription drugs, or dietary supplements you use. Also tell them if you smoke, drink alcohol, or use illegal drugs. Some items may interact with your medicine. What should I watch for while using this medication? Your condition will be monitored carefully while you are receiving this medication. This medication may increase the side effects of 5-fluorouracil. Tell your care team if you have diarrhea or mouth sores that do not get better or that get  worse. What side effects may I notice from receiving this medication? Side effects that you should report to your care team as soon as possible: Allergic reactions--skin rash, itching, hives, swelling of the face, lips, tongue, or throat This list may not describe all possible side effects. Call your doctor for medical advice about side effects. You may report side effects to FDA at 1-800-FDA-1088. Where should I keep my medication? This medication is given in a hospital or clinic. It will not be stored at home. NOTE: This sheet is a summary. It may not cover all possible information. If you have questions about this medicine, talk to your doctor, pharmacist, or health care provider.  2023 Elsevier/Gold Standard (2022-02-12 00:00:00)  Fluorouracil Injection What is this medication? FLUOROURACIL (  flure oh YOOR a sil) treats some types of cancer. It works by slowing down the growth of cancer cells. This medicine may be used for other purposes; ask your health care provider or pharmacist if you have questions. COMMON BRAND NAME(S): Adrucil What should I tell my care team before I take this medication? They need to know if you have any of these conditions: Blood disorders Dihydropyrimidine dehydrogenase (DPD) deficiency Infection, such as chickenpox, cold sores, herpes Kidney disease Liver disease Poor nutrition Recent or ongoing radiation therapy An unusual or allergic reaction to fluorouracil, other medications, foods, dyes, or preservatives If you or your partner are pregnant or trying to get pregnant Breast-feeding How should I use this medication? This medication is injected into a vein. It is administered by your care team in a hospital or clinic setting. Talk to your care team about the use of this medication in children. Special care may be needed. Overdosage: If you think you have taken too much of this medicine contact a poison control center or emergency room at once. NOTE:  This medicine is only for you. Do not share this medicine with others. What if I miss a dose? Keep appointments for follow-up doses. It is important not to miss your dose. Call your care team if you are unable to keep an appointment. What may interact with this medication? Do not take this medication with any of the following: Live virus vaccines This medication may also interact with the following: Medications that treat or prevent blood clots, such as warfarin, enoxaparin, dalteparin This list may not describe all possible interactions. Give your health care provider a list of all the medicines, herbs, non-prescription drugs, or dietary supplements you use. Also tell them if you smoke, drink alcohol, or use illegal drugs. Some items may interact with your medicine. What should I watch for while using this medication? Your condition will be monitored carefully while you are receiving this medication. This medication may make you feel generally unwell. This is not uncommon as chemotherapy can affect healthy cells as well as cancer cells. Report any side effects. Continue your course of treatment even though you feel ill unless your care team tells you to stop. In some cases, you may be given additional medications to help with side effects. Follow all directions for their use. This medication may increase your risk of getting an infection. Call your care team for advice if you get a fever, chills, sore throat, or other symptoms of a cold or flu. Do not treat yourself. Try to avoid being around people who are sick. This medication may increase your risk to bruise or bleed. Call your care team if you notice any unusual bleeding. Be careful brushing or flossing your teeth or using a toothpick because you may get an infection or bleed more easily. If you have any dental work done, tell your dentist you are receiving this medication. Avoid taking medications that contain aspirin, acetaminophen, ibuprofen,  naproxen, or ketoprofen unless instructed by your care team. These medications may hide a fever. Do not treat diarrhea with over the counter products. Contact your care team if you have diarrhea that lasts more than 2 days or if it is severe and watery. This medication can make you more sensitive to the sun. Keep out of the sun. If you cannot avoid being in the sun, wear protective clothing and sunscreen. Do not use sun lamps, tanning beds, or tanning booths. Talk to your care team if you or  your partner wish to become pregnant or think you might be pregnant. This medication can cause serious birth defects if taken during pregnancy and for 3 months after the last dose. A reliable form of contraception is recommended while taking this medication and for 3 months after the last dose. Talk to your care team about effective forms of contraception. Do not father a child while taking this medication and for 3 months after the last dose. Use a condom while having sex during this time period. Do not breastfeed while taking this medication. This medication may cause infertility. Talk to your care team if you are concerned about your fertility. What side effects may I notice from receiving this medication? Side effects that you should report to your care team as soon as possible: Allergic reactions--skin rash, itching, hives, swelling of the face, lips, tongue, or throat Heart attack--pain or tightness in the chest, shoulders, arms, or jaw, nausea, shortness of breath, cold or clammy skin, feeling faint or lightheaded Heart failure--shortness of breath, swelling of the ankles, feet, or hands, sudden weight gain, unusual weakness or fatigue Heart rhythm changes--fast or irregular heartbeat, dizziness, feeling faint or lightheaded, chest pain, trouble breathing High ammonia level--unusual weakness or fatigue, confusion, loss of appetite, nausea, vomiting, seizures Infection--fever, chills, cough, sore throat,  wounds that don't heal, pain or trouble when passing urine, general feeling of discomfort or being unwell Low red blood cell level--unusual weakness or fatigue, dizziness, headache, trouble breathing Pain, tingling, or numbness in the hands or feet, muscle weakness, change in vision, confusion or trouble speaking, loss of balance or coordination, trouble walking, seizures Redness, swelling, and blistering of the skin over hands and feet Severe or prolonged diarrhea Unusual bruising or bleeding Side effects that usually do not require medical attention (report to your care team if they continue or are bothersome): Dry skin Headache Increased tears Nausea Pain, redness, or swelling with sores inside the mouth or throat Sensitivity to light Vomiting This list may not describe all possible side effects. Call your doctor for medical advice about side effects. You may report side effects to FDA at 1-800-FDA-1088. Where should I keep my medication? This medication is given in a hospital or clinic. It will not be stored at home. NOTE: This sheet is a summary. It may not cover all possible information. If you have questions about this medicine, talk to your doctor, pharmacist, or health care provider.  2023 Elsevier/Gold Standard (2022-01-08 00:00:00)  The chemotherapy medication bag should finish at 46 hours, 96 hours, or 7 days. For example, if your pump is scheduled for 46 hours and it was put on at 4:00 p.m., it should finish at 2:00 p.m. the day it is scheduled to come off regardless of your appointment time.     Estimated time to finish at 11:30 am on 09/05/2022.   If the display on your pump reads "Low Volume" and it is beeping, take the batteries out of the pump and come to the cancer center for it to be taken off.   If the pump alarms go off prior to the pump reading "Low Volume" then call [phone] and someone can assist you.  If the plunger comes out and the chemotherapy  medication is leaking out, please use your home chemo spill kit to clean up the spill. Do NOT use paper towels or other household products.  If you have problems or questions regarding your pump, please call either [phone] (24 hours a day) or the cancer  center Monday-Friday 8:00 a.m.- 4:30 p.m. at the clinic number and we will assist you. If you are unable to get assistance, then go to the nearest Emergency Department and ask the staff to contact the IV team for assistance.

## 2022-09-03 NOTE — Progress Notes (Signed)
Neil Perry   Diagnosis: Rectal cancer  INTERVAL HISTORY:   Neil Perry completed another cycle of FOLFIRI on 08/21/2022.  No nausea/vomiting or mouth sores.  Reports 1 day of diarrhea following chemotherapy.  Mild bleeding related to hemorrhoids.  No other bleeding.  No symptom of thrombosis.  Objective:  Vital signs in last 24 hours:  Blood pressure (!) 160/80, pulse 66, temperature 98.2 F (36.8 C), temperature source Oral, resp. rate 20, height _0  (1.753 m), weight 152 lb 9.6 oz (69.2 kg), SpO2 100 %.    HEENT: No thrush or ulcers Resp: Lungs clear bilaterally Cardio: Regular rate and rhythm GI: No hepatosplenomegaly, nontender Vascular: No leg edema  Skin: Palms without erythema  Portacath/PICC-without erythema  Lab Results:  Lab Results  Component Value Date   WBC 5.3 09/03/2022   HGB 13.2 09/03/2022   HCT 39.1 09/03/2022   MCV 81.0 09/03/2022   PLT 272 09/03/2022   NEUTROABS 4.0 09/03/2022    CMP  Lab Results  Component Value Date   NA 139 09/03/2022   K 3.9 09/03/2022   CL 106 09/03/2022   CO2 22 09/03/2022   GLUCOSE 102 (H) 09/03/2022   BUN 14 09/03/2022   CREATININE 0.95 09/03/2022   CALCIUM 9.3 09/03/2022   PROT 6.5 09/03/2022   ALBUMIN 4.1 09/03/2022   AST 12 (L) 09/03/2022   ALT 10 09/03/2022   ALKPHOS 78 09/03/2022   BILITOT 0.6 09/03/2022   GFRNONAA >60 09/03/2022    Lab Results  Component Value Date   CEA 104.78 (H) 08/21/2022     Medications: I have reviewed the patient's current medications.   Assessment/Plan:  Rectal cancer MRI abdomen 04/26/2019 2-3 new hypervascular masses in the liver dome, no abdominal lymphadenopathy, stable benign left adrenal adenoma, stable right lower pole kidney mass Ultrasound-guided biopsy of a liver lesion 05/07/2021-metastatic adenocarcinoma with extensive necrosis, immunohistochemical profile consistent with a colorectal primary; foundation 1-microsatellite  stable, tumor mutation burden 5, K-rasG 12V, NRAS wildtype Colonoscopy 05/22/2021-ulcerated partially obstructing mass at 15 cm from anal verge CTs 05/30/2021-numerous small pulmonary nodules concerning for metastases, thickening of the rectum, multiple liver metastases Cycle 1 FOLFOX 06/12/2021 Cycle 2 FOLFOX 06/26/2021 Cycle 3 FOLFOX 07/10/2021, oxaliplatin dose reduced due to progressive decline in the [place] and platelet count Cycle 4 FOLFOX 07/24/2021 Cycle 5 FOLFOX 08/07/2021, 5-FU bolus eliminated and oxaliplatin dose reduced, insurance would not approve Udenyca CTs 08/15/2021-decrease size of lung nodules, decreased hepatic metastases, persistent anorectal wall thickening, stable right renal mass Cycle 6 FOLFOX 08/21/2021, 5-FU bolus and 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 Cycle 7 FOLFOX 09/04/2021, 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 Cycle 8 FOLFOX 09/18/2021, 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 Cycle 9 FOLFOX 10/08/2021, 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 Cycle 10 FOLFOX 10/23/2021, 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 CTs 11/01/2021-decrease in size of occasional small pulmonary nodules, continued decrease in size of multiple hypoenhancing hepatic metastases, unchanged posttreatment appearance of the low rectum, unchanged exophytic mass of the inferior pole of the right kidney measuring 1.5 x 1.3 cm Cycle 11 5-fluorouracil 11/06/2021 Cycle 12 5-fluorouracil 11/20/2021 Cycle 13 5-fluorouracil 12/04/2021 Maintenance Xeloda beginning 12/19/2021 Xeloda dose reduced to 1000 mg twice daily beginning 12/27/2021 Xeloda placed on hold 01/16/2022 CTs 01/22/2022-segment of distal ileum with circumferential wall thickening and perienteric inflammation, decrease in size of liver metastases, stable tiny bilateral pulmonary nodules, circumferential wall thickening in the rectum, stable lower pole right kidney lesion suspicious for renal cell  carcinoma Xeloda discontinued due to diarrhea 5-fluorouracil pump 02/05/2022 5-fluorouracil pump 02/26/2022 Treatment held 03/18/2022 due to diarrhea 5-fluorouracil pump 03/25/2022, dose reduced due to diarrhea 5-fluorouracil pump 04/16/2022 CTs 05/03/2022-unchanged tiny pulmonary nodules, unchanged liver metastases, stable appearance of the low rectal wall thickening, unchanged posterior right renal mass 5-fluorouracil pump 05/07/2022 5-fluorouracil pump 05/28/2022 5-fluorouracil pump 06/18/2022 CTs 07/04/2022-increased size of liver metastases, no new lesions, new 3 mm right lower lobe nodule-nonspecific Cycle 1 FOLFIRI/bevacizumab 07/16/2022 Cycle 2 FOLFIRI/bevacizumab 08/05/2022; 5-FU bolus eliminated, irinotecan dose reduced due to diarrhea and weight loss Cycle 3 FOLFIRI/bevacizumab 08/21/2022 Cycle 4 FOLFIRI/bevacizumab 09/03/2022 Prostate cancer-simple prostatectomy January 2019, Gleason 3+3, 10 to 12% of specimen Active surveillance, biopsy May 2019 with small focus of Gleason 3+3 disease in 1/6 cores, surveillance continued Elevated PSA 04/20/2020 Biopsy 06/19/2020 8/12 cores positive, Gleason 4+4 PET scan 07/07/2020-negative CT 07/07/2020 right iliac and retrocaval adenopathy, 1.3 cm subcapsular right lower pole renal lesion Androgen deprivation therapy beginning 08/21/2020 Radiation to prostate, seminal vesicles, and pelvic lymph nodes to 10/22 - 12/28/2020 He continues every 12-monthFirmagon and daily Xtandi 08/21/2022 patient reports Xtandi discontinued   3.  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  Mitral valve prolapse 5.  Family history of prostate cancer 6.  08/15/2021 with leg weakness and an episode of slurred speech-TIA?,  Brain imaging negative 7.  Diarrhea and fatigue while on capecitabine April 2023-capecitabine discontinued 01/18/2022   Disposition: Neil Perry completed 3 cycles of  FOLFIRI/bevacizumab.  He has tolerated the treatment well.  The CEA was lower when he was here on 08/21/2022.  [name].  Will follow-up on the CEA from today.  [place] develops significant diarrhea following this cycle of chemotherapy.  Neil Perry return for an office visit and chemotherapy in 2 weeks.  He will be referred for a restaging CT after cycle 5.  GBetsy Coder MD  09/03/2022  10:23 AM

## 2022-09-03 NOTE — Progress Notes (Signed)
Patient seen by Dr. [name] today  Vitals are within treatment parameters.No intervention needed for BP 160/80  Labs reviewed by Dr. [name] and are within treatment parameters.  Per physician team, patient is ready for treatment and there are NO modifications to the treatment plan.

## 2022-09-05 ENCOUNTER — Inpatient Hospital Stay: Payer: Medicare Other

## 2022-09-05 VITALS — BP 147/81 | HR 63 | Temp 98.1°F | Resp 18

## 2022-09-05 DIAGNOSIS — C20 Malignant neoplasm of rectum: Secondary | ICD-10-CM

## 2022-09-05 DIAGNOSIS — Z5111 Encounter for antineoplastic chemotherapy: Secondary | ICD-10-CM | POA: Diagnosis not present

## 2022-09-05 MED ORDER — SODIUM CHLORIDE 0.9% FLUSH
10.0000 mL | INTRAVENOUS | Status: DC | PRN
  Administered 2022-09-05: 10 mL

## 2022-09-05 MED ORDER — HEPARIN SOD (PORK) LOCK FLUSH 100 UNIT/ML IV SOLN
500.0000 [IU] | Freq: Once | INTRAVENOUS | Status: AC | PRN
  Administered 2022-09-05: 500 [IU]

## 2022-09-05 NOTE — Patient Instructions (Signed)

## 2022-09-16 ENCOUNTER — Other Ambulatory Visit: Payer: Self-pay | Admitting: Oncology

## 2022-09-17 ENCOUNTER — Encounter: Payer: Self-pay | Admitting: *Deleted

## 2022-09-17 ENCOUNTER — Inpatient Hospital Stay (HOSPITAL_BASED_OUTPATIENT_CLINIC_OR_DEPARTMENT_OTHER): Payer: Medicare Other | Admitting: Oncology

## 2022-09-17 ENCOUNTER — Inpatient Hospital Stay: Payer: Medicare Other

## 2022-09-17 VITALS — BP 156/83 | HR 71 | Temp 98.1°F | Resp 20 | Ht 69.0 in | Wt 152.6 lb

## 2022-09-17 DIAGNOSIS — Z5111 Encounter for antineoplastic chemotherapy: Secondary | ICD-10-CM | POA: Diagnosis not present

## 2022-09-17 DIAGNOSIS — C20 Malignant neoplasm of rectum: Secondary | ICD-10-CM

## 2022-09-17 LAB — CMP (CANCER CENTER ONLY)
ALT: 10 U/L (ref 0–44)
AST: 12 U/L — ABNORMAL LOW (ref 15–41)
Albumin: 3.8 g/dL (ref 3.5–5.0)
Alkaline Phosphatase: 66 U/L (ref 38–126)
Anion gap: 8 (ref 5–15)
BUN: 14 mg/dL (ref 8–23)
CO2: 25 mmol/L (ref 22–32)
Calcium: 9.2 mg/dL (ref 8.9–10.3)
Chloride: 107 mmol/L (ref 98–111)
Creatinine: 0.91 mg/dL (ref 0.61–1.24)
GFR, Estimated: >60 mL/min (ref >60)
Glucose, Bld: 103 mg/dL — ABNORMAL HIGH (ref 70–99)
Potassium: 3.9 mmol/L (ref 3.5–5.1)
Sodium: 140 mmol/L (ref 135–145)
Total Bilirubin: 0.6 mg/dL (ref 0.3–1.2)
Total Protein: 6.3 g/dL — ABNORMAL LOW (ref 6.5–8.1)

## 2022-09-17 LAB — CBC WITH DIFFERENTIAL (CANCER CENTER ONLY)
Abs Immature Granulocytes: 0.01 10*3/uL (ref 0.00–0.07)
Basophils Absolute: 0 10*3/uL (ref 0.0–0.1)
Basophils Relative: 0 %
Eosinophils Absolute: 0.2 10*3/uL (ref 0.0–0.5)
Eosinophils Relative: 4 %
HCT: 37.5 % — ABNORMAL LOW (ref 39.0–52.0)
Hemoglobin: 12.7 g/dL — ABNORMAL LOW (ref 13.0–17.0)
Immature Granulocytes: 0 %
Lymphocytes Relative: 16 %
Lymphs Abs: 0.7 10*3/uL (ref 0.7–4.0)
MCH: 27.4 pg (ref 26.0–34.0)
MCHC: 33.9 g/dL (ref 30.0–36.0)
MCV: 81 fL (ref 80.0–100.0)
Monocytes Absolute: 0.4 10*3/uL (ref 0.1–1.0)
Monocytes Relative: 8 %
Neutro Abs: 3.4 10*3/uL (ref 1.7–7.7)
Neutrophils Relative %: 72 %
Platelet Count: 277 10*3/uL (ref 150–400)
RBC: 4.63 MIL/uL (ref 4.22–5.81)
RDW: 16.2 % — ABNORMAL HIGH (ref 11.5–15.5)
WBC Count: 4.7 10*3/uL (ref 4.0–10.5)
nRBC: 0 % (ref 0.0–0.2)

## 2022-09-17 LAB — CEA (ACCESS): CEA (CHCC): 104.96 ng/mL — ABNORMAL HIGH (ref 0.00–5.00)

## 2022-09-17 LAB — TOTAL PROTEIN, URINE DIPSTICK: Protein, ur: 30 mg/dL — AB

## 2022-09-17 MED ORDER — SODIUM CHLORIDE 0.9 % IV SOLN
120.0000 mg/m2 | Freq: Once | INTRAVENOUS | Status: AC
  Administered 2022-09-17: 220 mg via INTRAVENOUS
  Filled 2022-09-17: qty 5

## 2022-09-17 MED ORDER — ATROPINE SULFATE 1 MG/ML IV SOLN
0.5000 mg | Freq: Once | INTRAVENOUS | Status: AC | PRN
  Administered 2022-09-17: 0.5 mg via INTRAVENOUS
  Filled 2022-09-17: qty 1

## 2022-09-17 MED ORDER — SODIUM CHLORIDE 0.9 % IV SOLN
5.0000 mg/kg | Freq: Once | INTRAVENOUS | Status: AC
  Administered 2022-09-17: 350 mg via INTRAVENOUS
  Filled 2022-09-17: qty 14

## 2022-09-17 MED ORDER — SODIUM CHLORIDE 0.9 % IV SOLN
400.0000 mg/m2 | Freq: Once | INTRAVENOUS | Status: AC
  Administered 2022-09-17: 740 mg via INTRAVENOUS
  Filled 2022-09-17: qty 37

## 2022-09-17 MED ORDER — SODIUM CHLORIDE 0.9 % IV SOLN
10.0000 mg | Freq: Once | INTRAVENOUS | Status: AC
  Administered 2022-09-17: 10 mg via INTRAVENOUS
  Filled 2022-09-17: qty 10

## 2022-09-17 MED ORDER — SODIUM CHLORIDE 0.9 % IV SOLN: Freq: Once | INTRAVENOUS | Status: AC

## 2022-09-17 MED ORDER — SODIUM CHLORIDE 0.9 % IV SOLN
3050.0000 mg | INTRAVENOUS | Status: DC
  Administered 2022-09-17: 3050 mg via INTRAVENOUS
  Filled 2022-09-17: qty 61

## 2022-09-17 MED ORDER — PALONOSETRON HCL INJECTION 0.25 MG/5ML
0.2500 mg | Freq: Once | INTRAVENOUS | Status: AC
  Administered 2022-09-17: 0.25 mg via INTRAVENOUS
  Filled 2022-09-17: qty 5

## 2022-09-17 NOTE — Progress Notes (Signed)
[facility] OFFICE PROGRESS NOTE   Diagnosis: Rectal cancer  INTERVAL HISTORY:   Neil. Henery completed another cycle of FOLFIRI/Avastin on 09/03/2022.  No nausea/vomiting, mouth sores, or diarrhea.  He has constipation.  He developed diarrhea if he takes Colace.  He reports mild nose bleeding and bleeding when he wipes.  The rectal bleeding is related to a "hemorrhoid ".  He reports chronic hemorrhoids.  No other bleeding.  Objective:  Vital signs in last 24 hours:  Blood pressure (!) 156/83, pulse 71, temperature 98.1 F (36.7 C), temperature source Oral, resp. rate 20, height _0  (1.753 m), weight 152 lb 9.6 oz (69.2 kg), SpO2 100 %.    HEENT: No thrush or ulcers Resp: Lungs clear bilaterally Cardio: Regular rate and rhythm GI: No hepatosplenomegaly Vascular: No leg edema    Portacath/PICC-without erythema  Lab Results:  Lab Results  Component Value Date   WBC 4.7 09/17/2022   HGB 12.7 (L) 09/17/2022   HCT 37.5 (L) 09/17/2022   MCV 81.0 09/17/2022   PLT 277 09/17/2022   NEUTROABS 3.4 09/17/2022    CMP  Lab Results  Component Value Date   NA 139 09/03/2022   K 3.9 09/03/2022   CL 106 09/03/2022   CO2 22 09/03/2022   GLUCOSE 102 (H) 09/03/2022   BUN 14 09/03/2022   CREATININE 0.95 09/03/2022   CALCIUM 9.3 09/03/2022   PROT 6.5 09/03/2022   ALBUMIN 4.1 09/03/2022   AST 12 (L) 09/03/2022   ALT 10 09/03/2022   ALKPHOS 78 09/03/2022   BILITOT 0.6 09/03/2022   GFRNONAA >60 09/03/2022    Lab Results  Component Value Date   CEA 105.65 (H) 09/03/2022     Medications: I have reviewed the patient's current medications.   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 2-3 new hypervascular masses in the liver dome, no abdominal lymphadenopathy, stable benign left adrenal adenoma, stable right lower pole kidney mass Ultrasound-guided biopsy of a liver lesion 05/07/2021-metastatic adenocarcinoma with extensive necrosis,  immunohistochemical profile consistent with a colorectal primary; foundation 1-microsatellite stable, tumor mutation burden 5, K-rasG 12V, NRAS wildtype Colonoscopy 05/22/2021-ulcerated partially obstructing mass at 15 cm from anal verge CTs 05/30/2021-numerous small pulmonary nodules concerning for metastases, thickening of the rectum, multiple liver metastases Cycle 1 FOLFOX 06/12/2021 Cycle 2 FOLFOX 06/26/2021 Cycle 3 FOLFOX 07/10/2021, oxaliplatin dose reduced due to progressive decline in the [place] and platelet count Cycle 4 FOLFOX 07/24/2021 Cycle 5 FOLFOX 08/07/2021, 5-FU bolus eliminated and oxaliplatin dose reduced, insurance would not approve Udenyca CTs 08/15/2021-decrease size of lung nodules, decreased hepatic metastases, persistent anorectal wall thickening, stable right renal mass Cycle 6 FOLFOX 08/21/2021, 5-FU bolus and 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 Cycle 7 FOLFOX 09/04/2021, 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 Cycle 8 FOLFOX 09/18/2021, 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 Cycle 9 FOLFOX 10/08/2021, 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 Cycle 10 FOLFOX 10/23/2021, 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 CTs 11/01/2021-decrease in size of occasional small pulmonary nodules, continued decrease in size of multiple hypoenhancing hepatic metastases, unchanged posttreatment appearance of the low rectum, unchanged exophytic mass of the inferior pole of the right kidney measuring 1.5 x 1.3 cm Cycle 11 5-fluorouracil 11/06/2021 Cycle 12 5-fluorouracil 11/20/2021 Cycle 13 5-fluorouracil 12/04/2021 Maintenance Xeloda beginning 12/19/2021 Xeloda dose reduced to 1000 mg twice daily beginning 12/27/2021 Xeloda placed on hold 01/16/2022 CTs 01/22/2022-segment of distal ileum with circumferential wall thickening and perienteric inflammation, decrease in size of liver metastases, stable tiny bilateral pulmonary nodules,  circumferential  wall thickening in the rectum, stable lower pole right kidney lesion suspicious for renal cell carcinoma Xeloda discontinued due to diarrhea 5-fluorouracil pump 02/05/2022 5-fluorouracil pump 02/26/2022 Treatment held 03/18/2022 due to diarrhea 5-fluorouracil pump 03/25/2022, dose reduced due to diarrhea 5-fluorouracil pump 04/16/2022 CTs 05/03/2022-unchanged tiny pulmonary nodules, unchanged liver metastases, stable appearance of the low rectal wall thickening, unchanged posterior right renal mass 5-fluorouracil pump 05/07/2022 5-fluorouracil pump 05/28/2022 5-fluorouracil pump 06/18/2022 CTs 07/04/2022-increased size of liver metastases, no new lesions, new 3 mm right lower lobe nodule-nonspecific Cycle 1 FOLFIRI/bevacizumab 07/16/2022 Cycle 2 FOLFIRI/bevacizumab 08/05/2022; 5-FU bolus eliminated, irinotecan dose reduced due to diarrhea and weight loss Cycle 3 FOLFIRI/bevacizumab 08/21/2022 Cycle 4 FOLFIRI/bevacizumab 09/03/2022 Cycle 5 FOLFIRI/bevacizumab 09/17/2022 Prostate cancer-simple prostatectomy January 2019, Gleason 3+3, 10 to 12% of specimen Active surveillance, biopsy May 2019 with small focus of Gleason 3+3 disease in 1/6 cores, surveillance continued Elevated PSA 04/20/2020 Biopsy 06/19/2020 8/12 cores positive, Gleason 4+4 PET scan 07/07/2020-negative CT 07/07/2020 right iliac and retrocaval adenopathy, 1.3 cm subcapsular right lower pole renal lesion Androgen deprivation therapy beginning 08/21/2020 Radiation to prostate, seminal vesicles, and pelvic lymph nodes to 10/22 - 12/28/2020 He continues every 35-monthFirmagon and daily Xtandi 08/21/2022 patient reports Xtandi discontinued   3.  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  Mitral valve prolapse 5.  Family history of prostate cancer 6.  08/15/2021 with leg weakness and an episode of slurred speech-TIA?,  Brain imaging negative 7.  Diarrhea and fatigue  while on capecitabine April 2023-capecitabine discontinued 01/18/2022     Disposition: Neil RColsonappears stable.  He is tolerating the FOLFIRI/bevacizumab well.  The CEA was stable 2 weeks ago.  We will follow-up on the CEA from today.  He will undergo restaging CTs prior to an office visit in 2 weeks.  He does not wish to undergo a surgical evaluation for the hemorrhoids at present.  GBetsy Coder MD  09/17/2022  10:41 AM

## 2022-09-17 NOTE — Patient Instructions (Signed)
[facility]   Discharge Instructions: Thank you for choosing [facility] to provide your oncology and hematology care.   If you have a lab appointment with the [facility], please go directly to the [facility] and check in at the registration area.   Wear comfortable clothing and clothing appropriate for easy access to any Portacath or PICC line.   We strive to give you quality time with your provider. You may need to reschedule your appointment if you arrive late (15 or more minutes).  Arriving late affects you and other patients whose appointments are after yours.  Also, if you miss three or more appointments without notifying the office, you may be dismissed from the clinic at the provider's discretion.      For prescription refill requests, have your pharmacy contact our office and allow 72 hours for refills to be completed.    Today you received the following chemotherapy and/or immunotherapy agents Bevacizumab-adcd (VEGZELMA), Irinotecan (CAMPTOSAR), Leucovorin & Flourouracil (ADRUCIL).      To help prevent nausea and vomiting after your treatment, we encourage you to take your nausea medication as directed.  BELOW ARE SYMPTOMS THAT SHOULD BE REPORTED IMMEDIATELY: *FEVER GREATER THAN 100.4 F (38 C) OR HIGHER *CHILLS OR SWEATING *NAUSEA AND VOMITING THAT IS NOT CONTROLLED WITH YOUR NAUSEA MEDICATION *UNUSUAL SHORTNESS OF BREATH *UNUSUAL BRUISING OR BLEEDING *URINARY PROBLEMS (pain or burning when urinating, or frequent urination) *BOWEL PROBLEMS (unusual diarrhea, constipation, pain near the anus) TENDERNESS IN MOUTH AND THROAT WITH OR WITHOUT PRESENCE OF ULCERS (sore throat, sores in mouth, or a toothache) UNUSUAL RASH, SWELLING OR PAIN  UNUSUAL VAGINAL DISCHARGE OR ITCHING   Items with * indicate a potential emergency and should be followed up as soon as possible or go to the Emergency Department if any problems should  occur.  Please show the CHEMOTHERAPY ALERT CARD or IMMUNOTHERAPY ALERT CARD at check-in to the Emergency Department and triage nurse.  Should you have questions after your visit or need to cancel or reschedule your appointment, please contact [facility]  Dept: [phone]  and follow the prompts.  Office hours are 8:00 a.m. to 4:30 p.m. Monday - Friday. Please note that voicemails left after 4:00 p.m. may not be returned until the following business day.  We are closed weekends and major holidays. You have access to a nurse at all times for urgent questions. Please call the main number to the clinic Dept: [phone] and follow the prompts.   For any non-urgent questions, you may also contact your provider using MyChart. We now offer e-Visits for anyone 65 and older to request care online for non-urgent symptoms. For details visit [email or web address].   Also download the MyChart app! Go to the app store, search "MyChart", open the app, select [facility], and log in with your MyChart username and password.  Masks are optional in the cancer centers. If you would like for your care team to wear a mask while they are taking care of you, please let them know. You may have one support person who is at least 82 years old accompany you for your appointments.  Bevacizumab Injection What is this medication? BEVACIZUMAB (be va SIZ yoo mab) treats some types of cancer. It works by blocking a protein that causes cancer cells to grow and multiply. This helps to slow or stop the spread of cancer cells. It is a monoclonal antibody. This medicine may be used for  other purposes; ask your health care provider or pharmacist if you have questions. COMMON BRAND NAME(S): Alymsys, Avastin, MVASI, Noah Charon What should I tell my care team before I take this medication? They need to know if you have any of these conditions: Blood clots Coughing up blood Having or recent surgery Heart  failure High blood pressure History of a connection between 2 or more body parts that do not usually connect (fistula) History of a tear in your stomach or intestines Protein in your urine An unusual or allergic reaction to bevacizumab, other medications, foods, dyes, or preservatives Pregnant or trying to get pregnant Breast-feeding How should I use this medication? This medication is injected into a vein. It is given by your care team in a hospital or clinic setting. Talk to your care team the use of this medication in children. Special care may be needed. Overdosage: If you think you have taken too much of this medicine contact a poison control center or emergency room at once. NOTE: This medicine is only for you. Do not share this medicine with others. What if I miss a dose? Keep appointments for follow-up doses. It is important not to miss your dose. Call your care team if you are unable to keep an appointment. What may interact with this medication? Interactions are not expected. This list may not describe all possible interactions. Give your health care provider a list of all the medicines, herbs, non-prescription drugs, or dietary supplements you use. Also tell them if you smoke, drink alcohol, or use illegal drugs. Some items may interact with your medicine. What should I watch for while using this medication? Your condition will be monitored carefully while you are receiving this medication. You may need blood work while taking this medication. This medication may make you feel generally unwell. This is not uncommon as chemotherapy can affect healthy cells as well as cancer cells. Report any side effects. Continue your course of treatment even though you feel ill unless your care team tells you to stop. This medication may increase your risk to bruise or bleed. Call your care team if you notice any unusual bleeding. Before having surgery, talk to your care team to make sure it is ok.  This medication can increase the risk of poor healing of your surgical site or wound. You will need to stop this medication for 28 days before surgery. After surgery, wait at least 28 days before restarting this medication. Make sure the surgical site or wound is healed enough before restarting this medication. Talk to your care team if questions. Talk to your care team if you may be pregnant. Serious birth defects can occur if you take this medication during pregnancy and for 6 months after the last dose. Contraception is recommended while taking this medication and for 6 months after the last dose. Your care team can help you find the option that works for you. Do not breastfeed while taking this medication and for 6 months after the last dose. This medication can cause infertility. Talk to your care team if you are concerned about your fertility. What side effects may I notice from receiving this medication? Side effects that you should report to your care team as soon as possible: Allergic reactions--skin rash, itching, hives, swelling of the face, lips, tongue, or throat Bleeding--bloody or black, tar-like stools, vomiting blood or brown material that looks like coffee grounds, red or dark brown urine, small red or purple spots on skin, unusual bruising or bleeding  Blood clot--pain, swelling, or warmth in the leg, shortness of breath, chest pain Heart attack--pain or tightness in the chest, shoulders, arms, or jaw, nausea, shortness of breath, cold or clammy skin, feeling faint or lightheaded Heart failure--shortness of breath, swelling of the ankles, feet, or hands, sudden weight gain, unusual weakness or fatigue Increase in blood pressure Infection--fever, chills, cough, sore throat, wounds that don't heal, pain or trouble when passing urine, general feeling of discomfort or being unwell Infusion reactions--chest pain, shortness of breath or trouble breathing, feeling faint or lightheaded Kidney  injury--decrease in the amount of urine, swelling of the ankles, hands, or feet Stomach pain that is severe, does not go away, or gets worse Stroke--sudden numbness or weakness of the face, arm, or leg, trouble speaking, confusion, trouble walking, loss of balance or coordination, dizziness, severe headache, change in vision Sudden and severe headache, confusion, change in vision, seizures, which may be signs of posterior reversible encephalopathy syndrome (PRES) Side effects that usually do not require medical attention (report to your care team if they continue or are bothersome): Back pain Change in taste Diarrhea Dry skin Increased tears Nosebleed This list may not describe all possible side effects. Call your doctor for medical advice about side effects. You may report side effects to FDA at 1-800-FDA-1088. Where should I keep my medication? This medication is given in a hospital or clinic. It will not be stored at home. NOTE: This sheet is a summary. It may not cover all possible information. If you have questions about this medicine, talk to your doctor, pharmacist, or health care provider.  2023 Elsevier/Gold Standard (2022-01-11 00:00:00)  Irinotecan Injection What is this medication? IRINOTECAN (ir in oh TEE kan) treats some types of cancer. It works by slowing down the growth of cancer cells. This medicine may be used for other purposes; ask your health care provider or pharmacist if you have questions. COMMON BRAND NAME(S): Camptosar What should I tell my care team before I take this medication? They need to know if you have any of these conditions: Dehydration Diarrhea Infection, especially a viral infection, such as chickenpox, cold sores, herpes Liver disease Low blood cell levels (white cells, red cells, and platelets) Low levels of electrolytes, such as calcium, magnesium, or potassium in your blood Recent or ongoing radiation An unusual or allergic reaction to  irinotecan, other medications, foods, dyes, or preservatives If you or your partner are pregnant or trying to get pregnant Breast-feeding How should I use this medication? This medication is injected into a vein. It is given by your care team in a hospital or clinic setting. Talk to your care team about the use of this medication in children. Special care may be needed. Overdosage: If you think you have taken too much of this medicine contact a poison control center or emergency room at once. NOTE: This medicine is only for you. Do not share this medicine with others. What if I miss a dose? Keep appointments for follow-up doses. It is important not to miss your dose. Call your care team if you are unable to keep an appointment. What may interact with this medication? Do not take this medication with any of the following: Cobicistat Itraconazole This medication may also interact with the following: Certain antibiotics, such as clarithromycin, rifampin, rifabutin Certain antivirals for HIV or AIDS Certain medications for fungal infections, such as ketoconazole, posaconazole, voriconazole Certain medications for seizures, such as carbamazepine, phenobarbital, phenytoin Gemfibrozil Nefazodone St. John's wort This list may  not describe all possible interactions. Give your health care provider a list of all the medicines, herbs, non-prescription drugs, or dietary supplements you use. Also tell them if you smoke, drink alcohol, or use illegal drugs. Some items may interact with your medicine. What should I watch for while using this medication? Your condition will be monitored carefully while you are receiving this medication. You may need blood work while taking this medication. This medication may make you feel generally unwell. This is not uncommon as chemotherapy can affect healthy cells as well as cancer cells. Report any side effects. Continue your course of treatment even though you feel ill  unless your care team tells you to stop. This medication can cause serious side effects. To reduce the risk, your care team may give you other medications to take before receiving this one. Be sure to follow the directions from your care team. This medication may affect your coordination, reaction time, or judgement. Do not drive or operate machinery until you know how this medication affects you. Sit up or stand slowly to reduce the risk of dizzy or fainting spells. Drinking alcohol with this medication can increase the risk of these side effects. This medication may increase your risk of getting an infection. Call your care team for advice if you get a fever, chills, sore throat, or other symptoms of a cold or flu. Do not treat yourself. Try to avoid being around people who are sick. Avoid taking medications that contain aspirin, acetaminophen, ibuprofen, naproxen, or ketoprofen unless instructed by your care team. These medications may hide a fever. This medication may increase your risk to bruise or bleed. Call your care team if you notice any unusual bleeding. Be careful brushing or flossing your teeth or using a toothpick because you may get an infection or bleed more easily. If you have any dental work done, tell your dentist you are receiving this medication. Talk to your care team if you or your partner are pregnant or think either of you might be pregnant. This medication can cause serious birth defects if taken during pregnancy and for 6 months after the last dose. You will need a negative pregnancy test before starting this medication. Contraception is recommended while taking this medication and for 6 months after the last dose. Your care team can help you find the option that works for you. Do not father a child while taking this medication and for 3 months after the last dose. Use a condom for contraception during this time period. Do not breastfeed while taking this medication and for 7 days  after the last dose. This medication may cause infertility. Talk to your care team if you are concerned about your fertility. What side effects may I notice from receiving this medication? Side effects that you should report to your care team as soon as possible: Allergic reactions--skin rash, itching, hives, swelling of the face, lips, tongue, or throat Dry cough, shortness of breath or trouble breathing Increased saliva or tears, increased sweating, stomach cramping, diarrhea, small pupils, unusual weakness or fatigue, slow heartbeat Infection--fever, chills, cough, sore throat, wounds that don't heal, pain or trouble when passing urine, general feeling of discomfort or being unwell Kidney injury--decrease in the amount of urine, swelling of the ankles, hands, or feet Low red blood cell level--unusual weakness or fatigue, dizziness, headache, trouble breathing Severe or prolonged diarrhea Unusual bruising or bleeding Side effects that usually do not require medical attention (report to your care team if they  continue or are bothersome): Constipation Diarrhea Hair loss Loss of appetite Nausea Stomach pain This list may not describe all possible side effects. Call your doctor for medical advice about side effects. You may report side effects to FDA at 1-800-FDA-1088. Where should I keep my medication? This medication is given in a hospital or clinic. It will not be stored at home. NOTE: This sheet is a summary. It may not cover all possible information. If you have questions about this medicine, talk to your doctor, pharmacist, or health care provider.  2023 Elsevier/Gold Standard (2022-01-17 00:00:00)  Leucovorin Injection What is this medication? LEUCOVORIN (loo koe VOR in) prevents side effects from certain medications, such as methotrexate. It works by increasing folate levels. This helps protect healthy cells in your body. It may also be used to treat anemia caused by low levels of  folate. It can also be used with fluorouracil, a type of chemotherapy, to treat colorectal cancer. It works by increasing the effects of fluorouracil in the body. This medicine may be used for other purposes; ask your health care provider or pharmacist if you have questions. What should I tell my care team before I take this medication? They need to know if you have any of these conditions: Anemia from low levels of vitamin B12 in the blood An unusual or allergic reaction to leucovorin, folic acid, other medications, foods, dyes, or preservatives Pregnant or trying to get pregnant Breastfeeding How should I use this medication? This medication is injected into a vein or a muscle. It is given by your care team in a hospital or clinic setting. Talk to your care team about the use of this medication in children. Special care may be needed. Overdosage: If you think you have taken too much of this medicine contact a poison control center or emergency room at once. NOTE: This medicine is only for you. Do not share this medicine with others. What if I miss a dose? Keep appointments for follow-up doses. It is important not to miss your dose. Call your care team if you are unable to keep an appointment. What may interact with this medication? Capecitabine Fluorouracil Phenobarbital Phenytoin Primidone Trimethoprim;sulfamethoxazole This list may not describe all possible interactions. Give your health care provider a list of all the medicines, herbs, non-prescription drugs, or dietary supplements you use. Also tell them if you smoke, drink alcohol, or use illegal drugs. Some items may interact with your medicine. What should I watch for while using this medication? Your condition will be monitored carefully while you are receiving this medication. This medication may increase the side effects of 5-fluorouracil. Tell your care team if you have diarrhea or mouth sores that do not get better or that get  worse. What side effects may I notice from receiving this medication? Side effects that you should report to your care team as soon as possible: Allergic reactions--skin rash, itching, hives, swelling of the face, lips, tongue, or throat This list may not describe all possible side effects. Call your doctor for medical advice about side effects. You may report side effects to FDA at 1-800-FDA-1088. Where should I keep my medication? This medication is given in a hospital or clinic. It will not be stored at home. NOTE: This sheet is a summary. It may not cover all possible information. If you have questions about this medicine, talk to your doctor, pharmacist, or health care provider.  2023 Elsevier/Gold Standard (2022-02-12 00:00:00)  Fluorouracil Injection What is this medication? FLUOROURACIL (  flure oh YOOR a sil) treats some types of cancer. It works by slowing down the growth of cancer cells. This medicine may be used for other purposes; ask your health care provider or pharmacist if you have questions. COMMON BRAND NAME(S): Adrucil What should I tell my care team before I take this medication? They need to know if you have any of these conditions: Blood disorders Dihydropyrimidine dehydrogenase (DPD) deficiency Infection, such as chickenpox, cold sores, herpes Kidney disease Liver disease Poor nutrition Recent or ongoing radiation therapy An unusual or allergic reaction to fluorouracil, other medications, foods, dyes, or preservatives If you or your partner are pregnant or trying to get pregnant Breast-feeding How should I use this medication? This medication is injected into a vein. It is administered by your care team in a hospital or clinic setting. Talk to your care team about the use of this medication in children. Special care may be needed. Overdosage: If you think you have taken too much of this medicine contact a poison control center or emergency room at once. NOTE:  This medicine is only for you. Do not share this medicine with others. What if I miss a dose? Keep appointments for follow-up doses. It is important not to miss your dose. Call your care team if you are unable to keep an appointment. What may interact with this medication? Do not take this medication with any of the following: Live virus vaccines This medication may also interact with the following: Medications that treat or prevent blood clots, such as warfarin, enoxaparin, dalteparin This list may not describe all possible interactions. Give your health care provider a list of all the medicines, herbs, non-prescription drugs, or dietary supplements you use. Also tell them if you smoke, drink alcohol, or use illegal drugs. Some items may interact with your medicine. What should I watch for while using this medication? Your condition will be monitored carefully while you are receiving this medication. This medication may make you feel generally unwell. This is not uncommon as chemotherapy can affect healthy cells as well as cancer cells. Report any side effects. Continue your course of treatment even though you feel ill unless your care team tells you to stop. In some cases, you may be given additional medications to help with side effects. Follow all directions for their use. This medication may increase your risk of getting an infection. Call your care team for advice if you get a fever, chills, sore throat, or other symptoms of a cold or flu. Do not treat yourself. Try to avoid being around people who are sick. This medication may increase your risk to bruise or bleed. Call your care team if you notice any unusual bleeding. Be careful brushing or flossing your teeth or using a toothpick because you may get an infection or bleed more easily. If you have any dental work done, tell your dentist you are receiving this medication. Avoid taking medications that contain aspirin, acetaminophen, ibuprofen,  naproxen, or ketoprofen unless instructed by your care team. These medications may hide a fever. Do not treat diarrhea with over the counter products. Contact your care team if you have diarrhea that lasts more than 2 days or if it is severe and watery. This medication can make you more sensitive to the sun. Keep out of the sun. If you cannot avoid being in the sun, wear protective clothing and sunscreen. Do not use sun lamps, tanning beds, or tanning booths. Talk to your care team if you or  your partner wish to become pregnant or think you might be pregnant. This medication can cause serious birth defects if taken during pregnancy and for 3 months after the last dose. A reliable form of contraception is recommended while taking this medication and for 3 months after the last dose. Talk to your care team about effective forms of contraception. Do not father a child while taking this medication and for 3 months after the last dose. Use a condom while having sex during this time period. Do not breastfeed while taking this medication. This medication may cause infertility. Talk to your care team if you are concerned about your fertility. What side effects may I notice from receiving this medication? Side effects that you should report to your care team as soon as possible: Allergic reactions--skin rash, itching, hives, swelling of the face, lips, tongue, or throat Heart attack--pain or tightness in the chest, shoulders, arms, or jaw, nausea, shortness of breath, cold or clammy skin, feeling faint or lightheaded Heart failure--shortness of breath, swelling of the ankles, feet, or hands, sudden weight gain, unusual weakness or fatigue Heart rhythm changes--fast or irregular heartbeat, dizziness, feeling faint or lightheaded, chest pain, trouble breathing High ammonia level--unusual weakness or fatigue, confusion, loss of appetite, nausea, vomiting, seizures Infection--fever, chills, cough, sore throat,  wounds that don't heal, pain or trouble when passing urine, general feeling of discomfort or being unwell Low red blood cell level--unusual weakness or fatigue, dizziness, headache, trouble breathing Pain, tingling, or numbness in the hands or feet, muscle weakness, change in vision, confusion or trouble speaking, loss of balance or coordination, trouble walking, seizures Redness, swelling, and blistering of the skin over hands and feet Severe or prolonged diarrhea Unusual bruising or bleeding Side effects that usually do not require medical attention (report to your care team if they continue or are bothersome): Dry skin Headache Increased tears Nausea Pain, redness, or swelling with sores inside the mouth or throat Sensitivity to light Vomiting This list may not describe all possible side effects. Call your doctor for medical advice about side effects. You may report side effects to FDA at 1-800-FDA-1088. Where should I keep my medication? This medication is given in a hospital or clinic. It will not be stored at home. NOTE: This sheet is a summary. It may not cover all possible information. If you have questions about this medicine, talk to your doctor, pharmacist, or health care provider.  2023 [employer] Standard (2022-01-08 00:00:00)  The chemotherapy medication bag should finish at 46 hours, 96 hours, or 7 days. For example, if your pump is scheduled for 46 hours and it was put on at 4:00 p.m., it should finish at 2:00 p.m. the day it is scheduled to come off regardless of your appointment time.     Estimated time to finish at 12:30 p.m. on Thursday 09/19/2022.   If the display on your pump reads "Low Volume" and it is beeping, take the batteries out of the pump and come to the cancer center for it to be taken off.   If the pump alarms go off prior to the pump reading "Low Volume" then call [phone] and someone can assist you.  If the plunger comes out and the  chemotherapy medication is leaking out, please use your home chemo spill kit to clean up the spill. Do NOT use paper towels or other household products.  If you have problems or questions regarding your pump, please call either [phone] (24 hours a day) or the  cancer center Monday-Friday 8:00 a.m.- 4:30 p.m. at the clinic number and we will assist you. If you are unable to get assistance, then go to the nearest Emergency Department and ask the staff to contact the IV team for assistance.

## 2022-09-17 NOTE — Progress Notes (Signed)
Patient seen by Dr. Sherrill today ? ?Vitals are within treatment parameters. ? ?Labs reviewed by Dr. Sherrill and are within treatment parameters. ? ?Per physician team, patient is ready for treatment and there are NO modifications to the treatment plan.  ?

## 2022-09-19 ENCOUNTER — Inpatient Hospital Stay: Payer: Medicare Other

## 2022-09-19 VITALS — BP 155/72 | HR 59 | Resp 20

## 2022-09-19 DIAGNOSIS — Z5111 Encounter for antineoplastic chemotherapy: Secondary | ICD-10-CM | POA: Diagnosis not present

## 2022-09-19 DIAGNOSIS — C20 Malignant neoplasm of rectum: Secondary | ICD-10-CM

## 2022-09-19 MED ORDER — SODIUM CHLORIDE 0.9% FLUSH
10.0000 mL | INTRAVENOUS | Status: DC | PRN
  Administered 2022-09-19: 10 mL

## 2022-09-19 MED ORDER — HEPARIN SOD (PORK) LOCK FLUSH 100 UNIT/ML IV SOLN
500.0000 [IU] | Freq: Once | INTRAVENOUS | Status: AC | PRN
  Administered 2022-09-19: 500 [IU]

## 2022-09-19 NOTE — Patient Instructions (Signed)

## 2022-09-26 ENCOUNTER — Encounter (HOSPITAL_BASED_OUTPATIENT_CLINIC_OR_DEPARTMENT_OTHER): Payer: Self-pay

## 2022-09-26 ENCOUNTER — Ambulatory Visit (HOSPITAL_BASED_OUTPATIENT_CLINIC_OR_DEPARTMENT_OTHER)
Admission: RE | Admit: 2022-09-26 | Discharge: 2022-09-26 | Disposition: A | Discharge status: Home / Self Care | Payer: Medicare Other | Source: Ambulatory Visit | Attending: Oncology | Admitting: Oncology

## 2022-09-26 DIAGNOSIS — C20 Malignant neoplasm of rectum: Secondary | ICD-10-CM | POA: Insufficient documentation

## 2022-09-26 MED ORDER — IOHEXOL 300 MG/ML  SOLN
100.0000 mL | Freq: Once | INTRAMUSCULAR | Status: AC | PRN
  Administered 2022-09-26: 85 mL via INTRAVENOUS

## 2022-09-29 ENCOUNTER — Other Ambulatory Visit: Payer: Self-pay | Admitting: Oncology

## 2022-10-01 ENCOUNTER — Inpatient Hospital Stay (HOSPITAL_BASED_OUTPATIENT_CLINIC_OR_DEPARTMENT_OTHER): Payer: Medicare Other | Admitting: Oncology

## 2022-10-01 ENCOUNTER — Inpatient Hospital Stay: Payer: Medicare Other

## 2022-10-01 ENCOUNTER — Inpatient Hospital Stay: Payer: Medicare Other | Attending: Nurse Practitioner

## 2022-10-01 ENCOUNTER — Other Ambulatory Visit: Payer: Self-pay | Admitting: *Deleted

## 2022-10-01 VITALS — BP 154/89 | HR 91 | Temp 98.1°F | Resp 18 | Ht 69.0 in | Wt 151.6 lb

## 2022-10-01 VITALS — BP 149/84 | HR 55

## 2022-10-01 DIAGNOSIS — N390 Urinary tract infection, site not specified: Secondary | ICD-10-CM

## 2022-10-01 DIAGNOSIS — C787 Secondary malignant neoplasm of liver and intrahepatic bile duct: Secondary | ICD-10-CM | POA: Insufficient documentation

## 2022-10-01 DIAGNOSIS — Z5112 Encounter for antineoplastic immunotherapy: Secondary | ICD-10-CM | POA: Insufficient documentation

## 2022-10-01 DIAGNOSIS — Z8546 Personal history of malignant neoplasm of prostate: Secondary | ICD-10-CM | POA: Insufficient documentation

## 2022-10-01 DIAGNOSIS — I341 Nonrheumatic mitral (valve) prolapse: Secondary | ICD-10-CM | POA: Diagnosis not present

## 2022-10-01 DIAGNOSIS — R918 Other nonspecific abnormal finding of lung field: Secondary | ICD-10-CM | POA: Insufficient documentation

## 2022-10-01 DIAGNOSIS — Z5111 Encounter for antineoplastic chemotherapy: Secondary | ICD-10-CM | POA: Insufficient documentation

## 2022-10-01 DIAGNOSIS — Z79899 Other long term (current) drug therapy: Secondary | ICD-10-CM | POA: Diagnosis not present

## 2022-10-01 DIAGNOSIS — C20 Malignant neoplasm of rectum: Secondary | ICD-10-CM

## 2022-10-01 LAB — CMP (CANCER CENTER ONLY)
ALT: 13 U/L (ref 0–44)
AST: 15 U/L (ref 15–41)
Albumin: 4.1 g/dL (ref 3.5–5.0)
Alkaline Phosphatase: 63 U/L (ref 38–126)
Anion gap: 11 (ref 5–15)
BUN: 18 mg/dL (ref 8–23)
CO2: 23 mmol/L (ref 22–32)
Calcium: 9.4 mg/dL (ref 8.9–10.3)
Chloride: 106 mmol/L (ref 98–111)
Creatinine: 0.94 mg/dL (ref 0.61–1.24)
GFR, Estimated: >60 mL/min (ref >60)
Glucose, Bld: 108 mg/dL — ABNORMAL HIGH (ref 70–99)
Potassium: 3.6 mmol/L (ref 3.5–5.1)
Sodium: 140 mmol/L (ref 135–145)
Total Bilirubin: 0.6 mg/dL (ref 0.3–1.2)
Total Protein: 6.5 g/dL (ref 6.5–8.1)

## 2022-10-01 LAB — CBC WITH DIFFERENTIAL (CANCER CENTER ONLY)
Abs Immature Granulocytes: 0.01 10*3/uL (ref 0.00–0.07)
Basophils Absolute: 0 10*3/uL (ref 0.0–0.1)
Basophils Relative: 0 %
Eosinophils Absolute: 0.2 10*3/uL (ref 0.0–0.5)
Eosinophils Relative: 5 %
HCT: 38.3 % — ABNORMAL LOW (ref 39.0–52.0)
Hemoglobin: 12.9 g/dL — ABNORMAL LOW (ref 13.0–17.0)
Immature Granulocytes: 0 %
Lymphocytes Relative: 18 %
Lymphs Abs: 0.8 10*3/uL (ref 0.7–4.0)
MCH: 27.2 pg (ref 26.0–34.0)
MCHC: 33.7 g/dL (ref 30.0–36.0)
MCV: 80.8 fL (ref 80.0–100.0)
Monocytes Absolute: 0.3 10*3/uL (ref 0.1–1.0)
Monocytes Relative: 7 %
Neutro Abs: 3.2 10*3/uL (ref 1.7–7.7)
Neutrophils Relative %: 70 %
Platelet Count: 251 10*3/uL (ref 150–400)
RBC: 4.74 MIL/uL (ref 4.22–5.81)
RDW: 16.6 % — ABNORMAL HIGH (ref 11.5–15.5)
WBC Count: 4.6 10*3/uL (ref 4.0–10.5)
nRBC: 0 % (ref 0.0–0.2)

## 2022-10-01 LAB — URINALYSIS, COMPLETE (UACMP) WITH MICROSCOPIC
Bilirubin Urine: NEGATIVE
Glucose, UA: NEGATIVE mg/dL
Ketones, ur: NEGATIVE mg/dL
Leukocytes,Ua: NEGATIVE
Nitrite: NEGATIVE
Protein, ur: 100 mg/dL — AB
RBC / HPF: >50 RBC/hpf — ABNORMAL HIGH (ref 0–5)
Specific Gravity, Urine: 1.028 (ref 1.005–1.030)
pH: 5.5 (ref 5.0–8.0)

## 2022-10-01 LAB — TOTAL PROTEIN, URINE DIPSTICK: Protein, ur: 100 mg/dL — AB

## 2022-10-01 LAB — CEA (ACCESS): CEA (CHCC): 107.34 ng/mL — ABNORMAL HIGH (ref 0.00–5.00)

## 2022-10-01 IMAGING — CT CT CHEST-ABD-PELV W/ CM
2 of 5 series · 13 of 46 positions shown, 15 images · IV contrast (APPLIED)
Comparison: 08/15/2021

CLINICAL DATA: Metastatic rectal cancer restaging, additional
history prostatectomy and right renal mass

EXAM:
CT CHEST, ABDOMEN, AND PELVIS WITH CONTRAST
TECHNIQUE: Multidetector CT imaging of the chest, abdomen and pelvis was
performed following the standard protocol during bolus
administration of intravenous contrast.

[Series 2: cap with · axial · 0.79mm/px · z∈[-213,+337]mm · 10 of 134 slices shown, 12 images]
[im 12/134  soft-tissue]
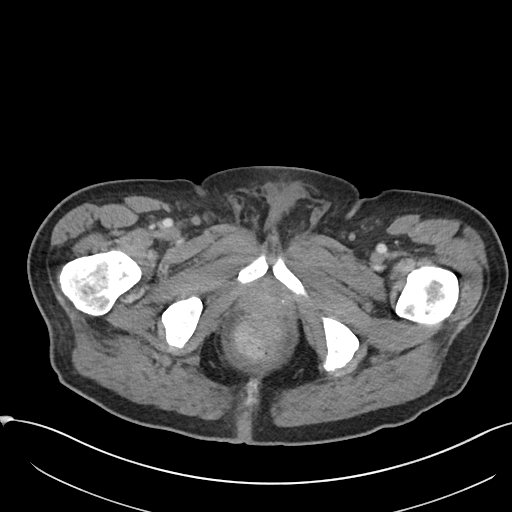
[im 12/134  bone]
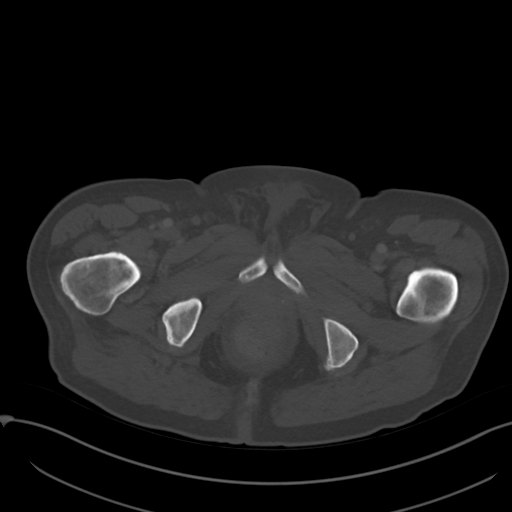
[im 23/134  soft-tissue]
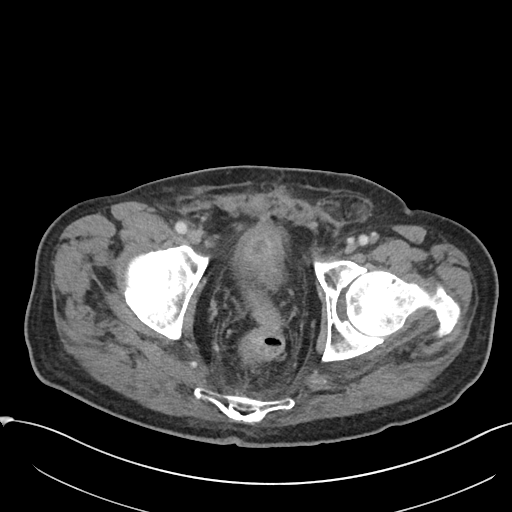
[im 34/134  soft-tissue]
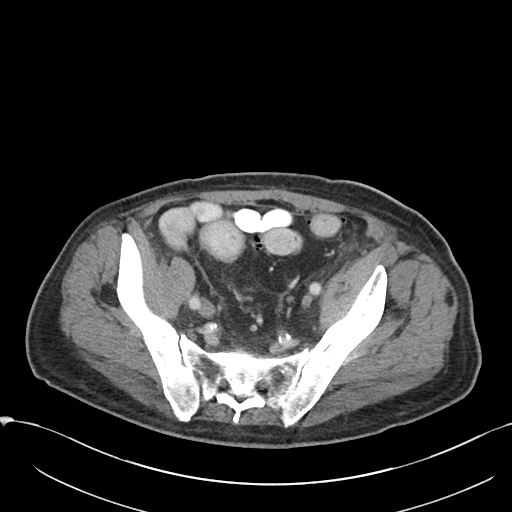
[im 45/134  soft-tissue]
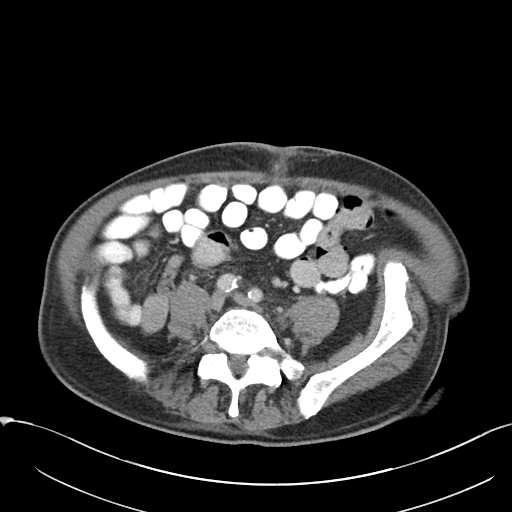
[im 56/134  soft-tissue]
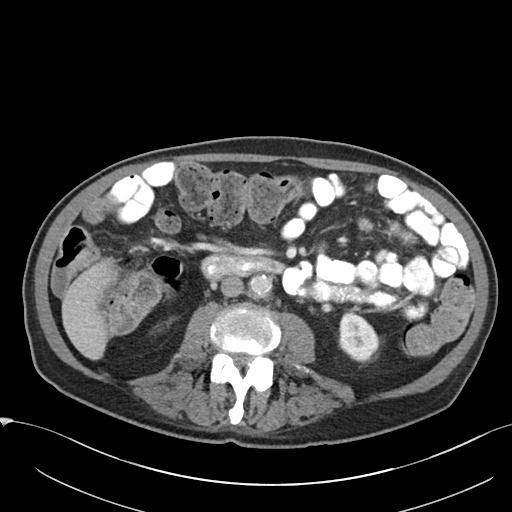
[im 78/134  soft-tissue]
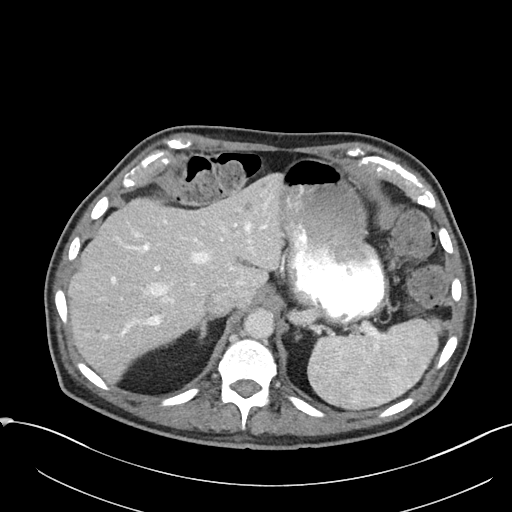
[im 89/134  soft-tissue]
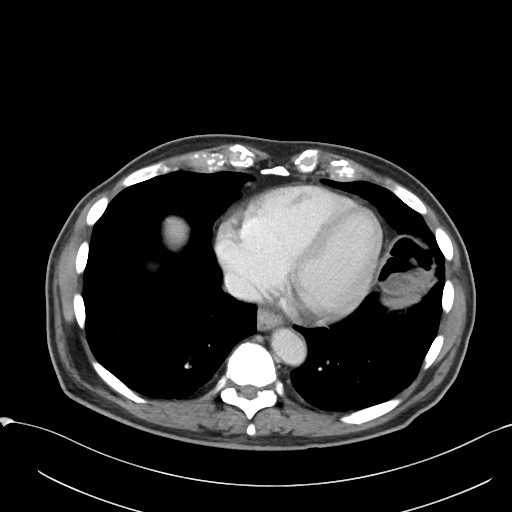
[im 100/134  soft-tissue]
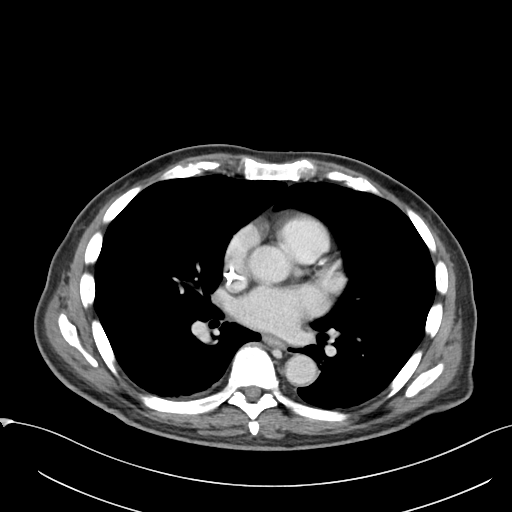
[im 111/134  soft-tissue]
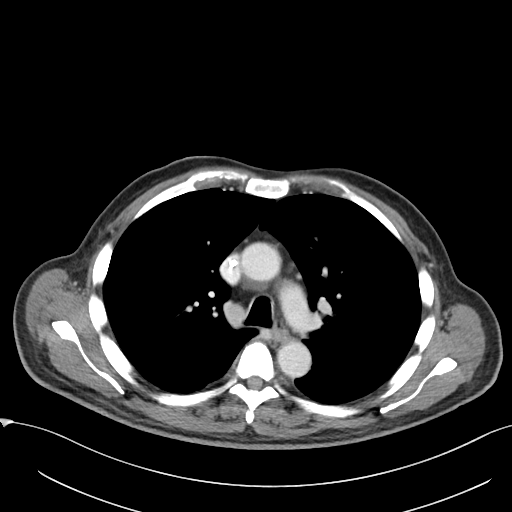
[im 111/134  bone]
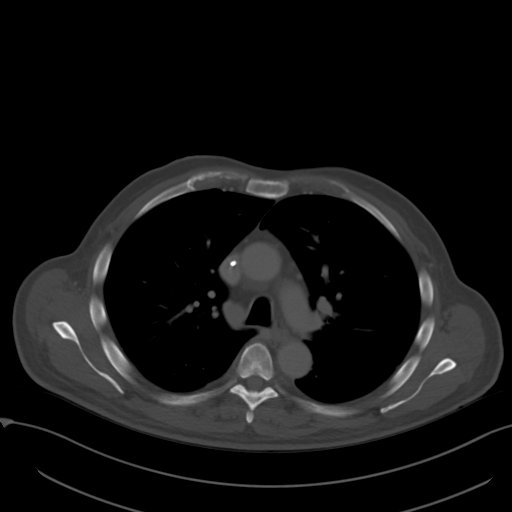
[im 122/134  soft-tissue]
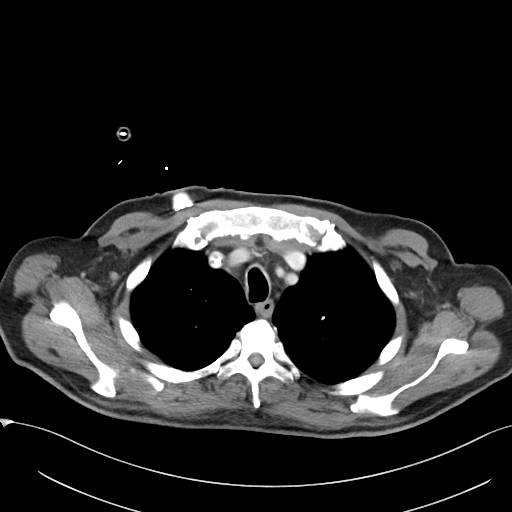

[Series 4: coronal · coronal · 0.87mm/px · 3 of 97 slices shown]
[im 33/97  soft-tissue]
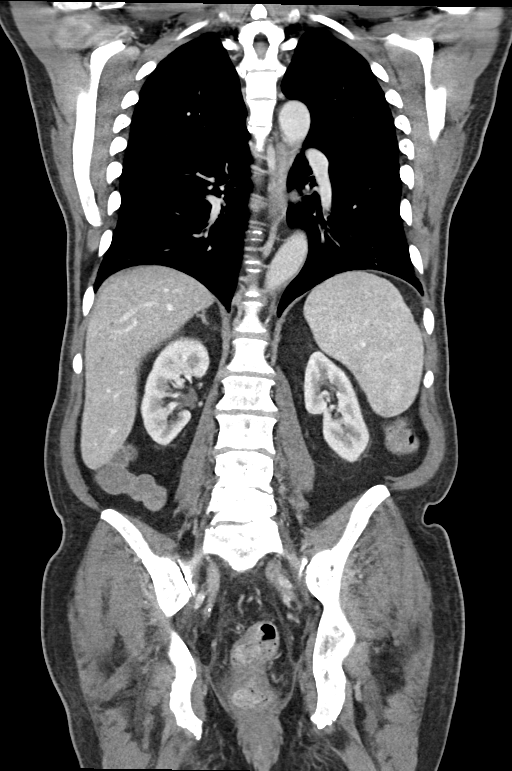
[im 43/97  soft-tissue]
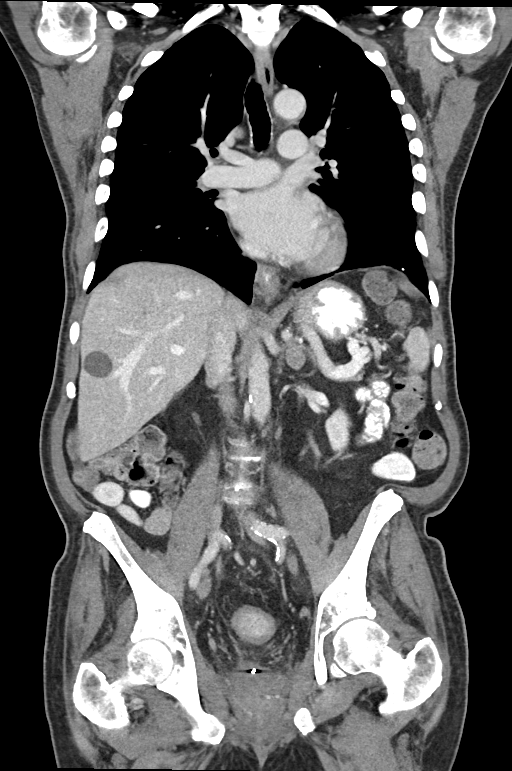
[im 54/97  soft-tissue]
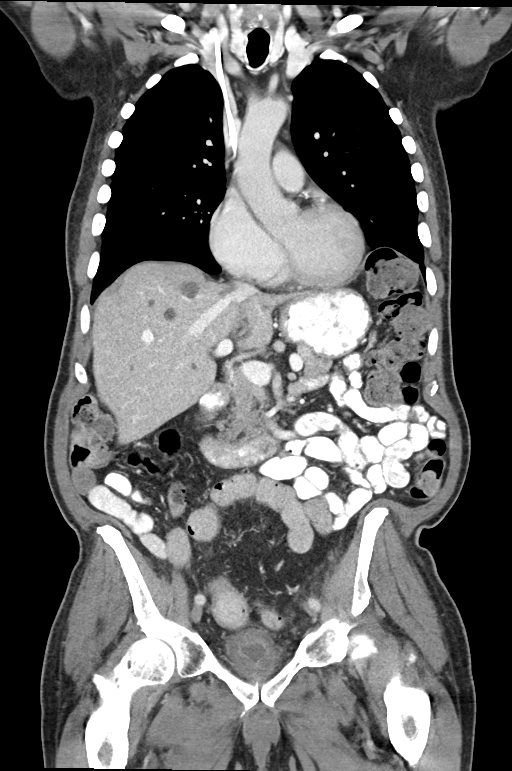

[13 of 46 positions shown; findings below may reference images not displayed]

RADIATION DOSE REDUCTION: This exam was performed according to the
departmental dose-optimization program which includes automated
exposure control, adjustment of the mA and/or kV according to
patient size and/or use of iterative reconstruction technique.

CONTRAST:  85mL OMNIPAQUE IOHEXOL 300 MG/ML SOLN, additional oral
enteric contrast
FINDINGS: CT CHEST FINDINGS

Cardiovascular: Right chest port catheter. Aortic atherosclerosis.
Normal heart size. Left and right coronary artery calcifications. No
pericardial effusion.

Mediastinum/Nodes: No enlarged mediastinal, hilar, or axillary lymph
nodes. Thyroid gland, trachea, and esophagus demonstrate no
significant findings.

Lungs/Pleura: Continued interval decrease in size of occasional
small pulmonary nodules, for example a nodule of the lateral segment
right middle lobe measuring no greater than 0.2 cm, previously
cm (series 3, image 106), a nodule of the posterior left upper lobe
measuring no greater than 0.1 cm, previously 0.3 cm (series 3, image
64). No new nodules. No pleural effusion or pneumothorax.

Musculoskeletal: No chest wall mass or suspicious osseous lesions
identified.

CT ABDOMEN PELVIS FINDINGS

Hepatobiliary: Continued interval decrease in size of multiple
hypoenhancing hepatic metastases, largest index lesion of hepatic
segment VIII measuring 2 3.0 x 2.8 cm, previously 4.2 x 4.0 cm,
additional adjacent lesion of hepatic segment VII/VIII measuring
x 1.9 cm, previously 2.6 x 2.4 cm (series 2, image 49). There are
additional incidental simple cysts of the liver. No gallstones,
gallbladder wall thickening, or biliary dilatation.

Pancreas: Unremarkable. No pancreatic ductal dilatation or
surrounding inflammatory changes.

Spleen: Normal in size without significant abnormality.

Adrenals/Urinary Tract: Stable, benign small left adrenal adenomata
(series 2, image 62). Unchanged, heterogeneously enhancing exophytic
mass of the inferior pole of the right kidney measuring 1.5 x 1.3 cm
(series 2, image 75). Very thickened, decompressed urinary bladder,
this appearance similar to prior examination.

Stomach/Bowel: Stomach is within normal limits. Appendix appears
normal. Sigmoid diverticula. Unchanged circumferential wall
thickening and perirectal fat stranding about the low rectum (series
2, image 120).

Vascular/Lymphatic: Aortic atherosclerosis. No enlarged abdominal or
pelvic lymph nodes.

Reproductive: Unchanged postoperative/post treatment appearance of
the prostate.

Other: No abdominal wall hernia or abnormality. No ascites.

Musculoskeletal: No acute osseous findings.
IMPRESSION: 1. Continued interval decrease in size of occasional small pulmonary
nodules, now very tiny, consistent with ongoing treatment response
of pulmonary metastatic disease.
2. Continued interval decrease in size of multiple hypoenhancing
hepatic metastases, consistent with ongoing treatment response of
hepatic metastatic disease.
3. Unchanged post treatment appearance of the low rectum with
circumferential wall thickening and perirectal fat stranding.
4. Unchanged, heterogeneously enhancing exophytic mass of the
inferior pole of the right kidney measuring 1.5 x 1.3 cm. This
remains consistent with a small incidental renal cell carcinoma.
5. Unchanged postoperative/post treatment appearance of the prostate
status post simple prostatectomy.
6. Very thickened, decompressed urinary bladder, this appearance
similar to prior examination. Correlate with urinalysis evidence of
infectious or inflammatory cystitis.
7. Coronary artery disease.

Aortic Atherosclerosis ([id number]).

## 2022-10-01 MED ORDER — ATROPINE SULFATE 1 MG/ML IV SOLN
0.5000 mg | Freq: Once | INTRAVENOUS | Status: AC | PRN
  Administered 2022-10-01: 0.5 mg via INTRAVENOUS
  Filled 2022-10-01: qty 1

## 2022-10-01 MED ORDER — SODIUM CHLORIDE 0.9 % IV SOLN
1600.0000 mg/m2 | INTRAVENOUS | Status: DC
  Administered 2022-10-01: 2950 mg via INTRAVENOUS
  Filled 2022-10-01: qty 59

## 2022-10-01 MED ORDER — PALONOSETRON HCL INJECTION 0.25 MG/5ML
0.2500 mg | Freq: Once | INTRAVENOUS | Status: AC
  Administered 2022-10-01: 0.25 mg via INTRAVENOUS
  Filled 2022-10-01: qty 5

## 2022-10-01 MED ORDER — SODIUM CHLORIDE 0.9 % IV SOLN
5.0000 mg/kg | Freq: Once | INTRAVENOUS | Status: AC
  Administered 2022-10-01: 350 mg via INTRAVENOUS
  Filled 2022-10-01: qty 14

## 2022-10-01 MED ORDER — SODIUM CHLORIDE 0.9 % IV SOLN
10.0000 mg | Freq: Once | INTRAVENOUS | Status: AC
  Administered 2022-10-01: 10 mg via INTRAVENOUS
  Filled 2022-10-01: qty 10

## 2022-10-01 MED ORDER — SODIUM CHLORIDE 0.9 % IV SOLN: Freq: Once | INTRAVENOUS | Status: AC

## 2022-10-01 MED ORDER — SODIUM CHLORIDE 0.9 % IV SOLN
400.0000 mg/m2 | Freq: Once | INTRAVENOUS | Status: AC
  Administered 2022-10-01: 740 mg via INTRAVENOUS
  Filled 2022-10-01: qty 37

## 2022-10-01 MED ORDER — CIPROFLOXACIN HCL 500 MG PO TABS: 500.0000 mg | ORAL_TABLET | Freq: Two times a day (BID) | ORAL | 0 refills | Status: DC

## 2022-10-01 MED ORDER — SODIUM CHLORIDE 0.9 % IV SOLN
120.0000 mg/m2 | Freq: Once | INTRAVENOUS | Status: AC
  Administered 2022-10-01: 220 mg via INTRAVENOUS
  Filled 2022-10-01: qty 6

## 2022-10-01 NOTE — Patient Instructions (Addendum)
[place]  The chemotherapy medication bag should finish at 46 hours, 96 hours, or 7 days. For example, if your pump is scheduled for 46 hours and it was put on at 4:00 p.m., it should finish at 2:00 p.m. the day it is scheduled to come off regardless of your appointment time.     Estimated time to finish at 12:00, Thursday, October 03, 2022.   If the display on your pump reads "Low Volume" and it is beeping, take the batteries out of the pump and come to the cancer center for it to be taken off.   If the pump alarms go off prior to the pump reading "Low Volume" then call [phone] and someone can assist you.  If the plunger comes out and the chemotherapy medication is leaking out, please use your home chemo spill kit to clean up the spill. Do NOT use paper towels or other household products.  If you have problems or questions regarding your pump, please call either [phone] (24 hours a day) or the cancer center Monday-Friday 8:00 a.m.- 4:30 p.m. at the clinic number and we will assist you. If you are unable to get assistance, then go to the nearest Emergency Department and ask the staff to contact the IV team for assistance.   Discharge Instructions: Thank you for choosing [facility] to provide your oncology and hematology care.   If you have a lab appointment with the [facility], please go directly to the [facility] and check in at the registration area.   Wear comfortable clothing and clothing appropriate for easy access to any Portacath or PICC line.   We strive to give you quality time with your provider. You may need to reschedule your appointment if you arrive late (15 or more minutes).  Arriving late affects you and other patients whose appointments are after yours.  Also, if you miss three or more appointments without notifying the office, you may be dismissed from the clinic at the provider's discretion.      For  prescription refill requests, have your pharmacy contact our office and allow 72 hours for refills to be completed.    Today you received the following chemotherapy and/or immunotherapy agents Avastin, Irinotecan, Leucovorin, Fluorouracil.      To help prevent nausea and vomiting after your treatment, we encourage you to take your nausea medication as directed.  BELOW ARE SYMPTOMS THAT SHOULD BE REPORTED IMMEDIATELY: *FEVER GREATER THAN 100.4 F (38 C) OR HIGHER *CHILLS OR SWEATING *NAUSEA AND VOMITING THAT IS NOT CONTROLLED WITH YOUR NAUSEA MEDICATION *UNUSUAL SHORTNESS OF BREATH *UNUSUAL BRUISING OR BLEEDING *URINARY PROBLEMS (pain or burning when urinating, or frequent urination) *BOWEL PROBLEMS (unusual diarrhea, constipation, pain near the anus) TENDERNESS IN MOUTH AND THROAT WITH OR WITHOUT PRESENCE OF ULCERS (sore throat, sores in mouth, or a toothache) UNUSUAL RASH, SWELLING OR PAIN  UNUSUAL VAGINAL DISCHARGE OR ITCHING   Items with * indicate a potential emergency and should be followed up as soon as possible or go to the Emergency Department if any problems should occur.  Please show the CHEMOTHERAPY ALERT CARD or IMMUNOTHERAPY ALERT CARD at check-in to the Emergency Department and triage nurse.  Should you have questions after your visit or need to cancel or reschedule your appointment, please contact [facility]  Dept: [phone]  and follow the prompts.  Office hours are 8:00 a.m. to 4:30 p.m. Monday - Friday. Please note that  voicemails left after 4:00 p.m. may not be returned until the following business day.  We are closed weekends and major holidays. You have access to a nurse at all times for urgent questions. Please call the main number to the clinic Dept: [phone] and follow the prompts.   For any non-urgent questions, you may also contact your provider using MyChart. We now offer e-Visits for anyone 47 and older to request care online  for non-urgent symptoms. For details visit [email or web address].   Also download the MyChart app! Go to the app store, search "MyChart", open the app, select [facility], and log in with your MyChart username and password.  Bevacizumab Injection What is this medication? BEVACIZUMAB (be va SIZ yoo mab) treats some types of cancer. It works by blocking a protein that causes cancer cells to grow and multiply. This helps to slow or stop the spread of cancer cells. It is a monoclonal antibody. This medicine may be used for other purposes; ask your health care provider or pharmacist if you have questions. COMMON BRAND NAME(S): Alymsys, Avastin, MVASI, Noah Charon What should I tell my care team before I take this medication? They need to know if you have any of these conditions: Blood clots Coughing up blood Having or recent surgery Heart failure High blood pressure History of a connection between 2 or more body parts that do not usually connect (fistula) History of a tear in your stomach or intestines Protein in your urine An unusual or allergic reaction to bevacizumab, other medications, foods, dyes, or preservatives Pregnant or trying to get pregnant Breast-feeding How should I use this medication? This medication is injected into a vein. It is given by your care team in a hospital or clinic setting. Talk to your care team the use of this medication in children. Special care may be needed. Overdosage: If you think you have taken too much of this medicine contact a poison control center or emergency room at once. NOTE: This medicine is only for you. Do not share this medicine with others. What if I miss a dose? Keep appointments for follow-up doses. It is important not to miss your dose. Call your care team if you are unable to keep an appointment. What may interact with this medication? Interactions are not expected. This list may not describe all possible interactions. Give your health  care provider a list of all the medicines, herbs, non-prescription drugs, or dietary supplements you use. Also tell them if you smoke, drink alcohol, or use illegal drugs. Some items may interact with your medicine. What should I watch for while using this medication? Your condition will be monitored carefully while you are receiving this medication. You may need blood work while taking this medication. This medication may make you feel generally unwell. This is not uncommon as chemotherapy can affect healthy cells as well as cancer cells. Report any side effects. Continue your course of treatment even though you feel ill unless your care team tells you to stop. This medication may increase your risk to bruise or bleed. Call your care team if you notice any unusual bleeding. Before having surgery, talk to your care team to make sure it is ok. This medication can increase the risk of poor healing of your surgical site or wound. You will need to stop this medication for 28 days before surgery. After surgery, wait at least 28 days before restarting this medication. Make sure the surgical site or wound is healed enough before restarting this medication.  Talk to your care team if questions. Talk to your care team if you may be pregnant. Serious birth defects can occur if you take this medication during pregnancy and for 6 months after the last dose. Contraception is recommended while taking this medication and for 6 months after the last dose. Your care team can help you find the option that works for you. Do not breastfeed while taking this medication and for 6 months after the last dose. This medication can cause infertility. Talk to your care team if you are concerned about your fertility. What side effects may I notice from receiving this medication? Side effects that you should report to your care team as soon as possible: Allergic reactions--skin rash, itching, hives, swelling of the face, lips, tongue,  or throat Bleeding--bloody or black, tar-like stools, vomiting blood or brown material that looks like coffee grounds, red or dark brown urine, small red or purple spots on skin, unusual bruising or bleeding Blood clot--pain, swelling, or warmth in the leg, shortness of breath, chest pain Heart attack--pain or tightness in the chest, shoulders, arms, or jaw, nausea, shortness of breath, cold or clammy skin, feeling faint or lightheaded Heart failure--shortness of breath, swelling of the ankles, feet, or hands, sudden weight gain, unusual weakness or fatigue Increase in blood pressure Infection--fever, chills, cough, sore throat, wounds that don't heal, pain or trouble when passing urine, general feeling of discomfort or being unwell Infusion reactions--chest pain, shortness of breath or trouble breathing, feeling faint or lightheaded Kidney injury--decrease in the amount of urine, swelling of the ankles, hands, or feet Stomach pain that is severe, does not go away, or gets worse Stroke--sudden numbness or weakness of the face, arm, or leg, trouble speaking, confusion, trouble walking, loss of balance or coordination, dizziness, severe headache, change in vision Sudden and severe headache, confusion, change in vision, seizures, which may be signs of posterior reversible encephalopathy syndrome (PRES) Side effects that usually do not require medical attention (report to your care team if they continue or are bothersome): Back pain Change in taste Diarrhea Dry skin Increased tears Nosebleed This list may not describe all possible side effects. Call your doctor for medical advice about side effects. You may report side effects to FDA at 1-800-FDA-1088. Where should I keep my medication? This medication is given in a hospital or clinic. It will not be stored at home. NOTE: This sheet is a summary. It may not cover all possible information. If you have questions about this medicine, talk to your  doctor, pharmacist, or health care provider.  2023 Elsevier/Gold Standard (2022-01-11 00:00:00)  Irinotecan Injection What is this medication? IRINOTECAN (ir in oh TEE kan) treats some types of cancer. It works by slowing down the growth of cancer cells. This medicine may be used for other purposes; ask your health care provider or pharmacist if you have questions. COMMON BRAND NAME(S): Camptosar What should I tell my care team before I take this medication? They need to know if you have any of these conditions: Dehydration Diarrhea Infection, especially a viral infection, such as chickenpox, cold sores, herpes Liver disease Low blood cell levels (white cells, red cells, and platelets) Low levels of electrolytes, such as calcium, magnesium, or potassium in your blood Recent or ongoing radiation An unusual or allergic reaction to irinotecan, other medications, foods, dyes, or preservatives If you or your partner are pregnant or trying to get pregnant Breast-feeding How should I use this medication? This medication is injected into a  vein. It is given by your care team in a hospital or clinic setting. Talk to your care team about the use of this medication in children. Special care may be needed. Overdosage: If you think you have taken too much of this medicine contact a poison control center or emergency room at once. NOTE: This medicine is only for you. Do not share this medicine with others. What if I miss a dose? Keep appointments for follow-up doses. It is important not to miss your dose. Call your care team if you are unable to keep an appointment. What may interact with this medication? Do not take this medication with any of the following: Cobicistat Itraconazole This medication may also interact with the following: Certain antibiotics, such as clarithromycin, rifampin, rifabutin Certain antivirals for HIV or AIDS Certain medications for fungal infections, such as  ketoconazole, posaconazole, voriconazole Certain medications for seizures, such as carbamazepine, phenobarbital, phenytoin Gemfibrozil Nefazodone St. John's wort This list may not describe all possible interactions. Give your health care provider a list of all the medicines, herbs, non-prescription drugs, or dietary supplements you use. Also tell them if you smoke, drink alcohol, or use illegal drugs. Some items may interact with your medicine. What should I watch for while using this medication? Your condition will be monitored carefully while you are receiving this medication. You may need blood work while taking this medication. This medication may make you feel generally unwell. This is not uncommon as chemotherapy can affect healthy cells as well as cancer cells. Report any side effects. Continue your course of treatment even though you feel ill unless your care team tells you to stop. This medication can cause serious side effects. To reduce the risk, your care team may give you other medications to take before receiving this one. Be sure to follow the directions from your care team. This medication may affect your coordination, reaction time, or judgement. Do not drive or operate machinery until you know how this medication affects you. Sit up or stand slowly to reduce the risk of dizzy or fainting spells. Drinking alcohol with this medication can increase the risk of these side effects. This medication may increase your risk of getting an infection. Call your care team for advice if you get a fever, chills, sore throat, or other symptoms of a cold or flu. Do not treat yourself. Try to avoid being around people who are sick. Avoid taking medications that contain aspirin, acetaminophen, ibuprofen, naproxen, or ketoprofen unless instructed by your care team. These medications may hide a fever. This medication may increase your risk to bruise or bleed. Call your care team if you notice any unusual  bleeding. Be careful brushing or flossing your teeth or using a toothpick because you may get an infection or bleed more easily. If you have any dental work done, tell your dentist you are receiving this medication. Talk to your care team if you or your partner are pregnant or think either of you might be pregnant. This medication can cause serious birth defects if taken during pregnancy and for 6 months after the last dose. You will need a negative pregnancy test before starting this medication. Contraception is recommended while taking this medication and for 6 months after the last dose. Your care team can help you find the option that works for you. Do not father a child while taking this medication and for 3 months after the last dose. Use a condom for contraception during this time period. Do  not breastfeed while taking this medication and for 7 days after the last dose. This medication may cause infertility. Talk to your care team if you are concerned about your fertility. What side effects may I notice from receiving this medication? Side effects that you should report to your care team as soon as possible: Allergic reactions--skin rash, itching, hives, swelling of the face, lips, tongue, or throat Dry cough, shortness of breath or trouble breathing Increased saliva or tears, increased sweating, stomach cramping, diarrhea, small pupils, unusual weakness or fatigue, slow heartbeat Infection--fever, chills, cough, sore throat, wounds that don't heal, pain or trouble when passing urine, general feeling of discomfort or being unwell Kidney injury--decrease in the amount of urine, swelling of the ankles, hands, or feet Low red blood cell level--unusual weakness or fatigue, dizziness, headache, trouble breathing Severe or prolonged diarrhea Unusual bruising or bleeding Side effects that usually do not require medical attention (report to your care team if they continue or are  bothersome): Constipation Diarrhea Hair loss Loss of appetite Nausea Stomach pain This list may not describe all possible side effects. Call your doctor for medical advice about side effects. You may report side effects to FDA at 1-800-FDA-1088. Where should I keep my medication? This medication is given in a hospital or clinic. It will not be stored at home. NOTE: This sheet is a summary. It may not cover all possible information. If you have questions about this medicine, talk to your doctor, pharmacist, or health care provider.  2023 Elsevier/Gold Standard (2022-01-17 00:00:00)  Leucovorin Injection What is this medication? LEUCOVORIN (loo koe VOR in) prevents side effects from certain medications, such as methotrexate. It works by increasing folate levels. This helps protect healthy cells in your body. It may also be used to treat anemia caused by low levels of folate. It can also be used with fluorouracil, a type of chemotherapy, to treat colorectal cancer. It works by increasing the effects of fluorouracil in the body. This medicine may be used for other purposes; ask your health care provider or pharmacist if you have questions. What should I tell my care team before I take this medication? They need to know if you have any of these conditions: Anemia from low levels of vitamin B12 in the blood An unusual or allergic reaction to leucovorin, folic acid, other medications, foods, dyes, or preservatives Pregnant or trying to get pregnant Breastfeeding How should I use this medication? This medication is injected into a vein or a muscle. It is given by your care team in a hospital or clinic setting. Talk to your care team about the use of this medication in children. Special care may be needed. Overdosage: If you think you have taken too much of this medicine contact a poison control center or emergency room at once. NOTE: This medicine is only for you. Do not share this medicine with  others. What if I miss a dose? Keep appointments for follow-up doses. It is important not to miss your dose. Call your care team if you are unable to keep an appointment. What may interact with this medication? Capecitabine Fluorouracil Phenobarbital Phenytoin Primidone Trimethoprim;sulfamethoxazole This list may not describe all possible interactions. Give your health care provider a list of all the medicines, herbs, non-prescription drugs, or dietary supplements you use. Also tell them if you smoke, drink alcohol, or use illegal drugs. Some items may interact with your medicine. What should I watch for while using this medication? Your condition will be monitored  carefully while you are receiving this medication. This medication may increase the side effects of 5-fluorouracil. Tell your care team if you have diarrhea or mouth sores that do not get better or that get worse. What side effects may I notice from receiving this medication? Side effects that you should report to your care team as soon as possible: Allergic reactions--skin rash, itching, hives, swelling of the face, lips, tongue, or throat This list may not describe all possible side effects. Call your doctor for medical advice about side effects. You may report side effects to FDA at 1-800-FDA-1088. Where should I keep my medication? This medication is given in a hospital or clinic. It will not be stored at home. NOTE: This sheet is a summary. It may not cover all possible information. If you have questions about this medicine, talk to your doctor, pharmacist, or health care provider.  2023 Elsevier/Gold Standard (2022-02-12 00:00:00)  Fluorouracil Injection What is this medication? FLUOROURACIL (flure oh YOOR a sil) treats some types of cancer. It works by slowing down the growth of cancer cells. This medicine may be used for other purposes; ask your health care provider or pharmacist if you have questions. COMMON BRAND  NAME(S): Adrucil What should I tell my care team before I take this medication? They need to know if you have any of these conditions: Blood disorders Dihydropyrimidine dehydrogenase (DPD) deficiency Infection, such as chickenpox, cold sores, herpes Kidney disease Liver disease Poor nutrition Recent or ongoing radiation therapy An unusual or allergic reaction to fluorouracil, other medications, foods, dyes, or preservatives If you or your partner are pregnant or trying to get pregnant Breast-feeding How should I use this medication? This medication is injected into a vein. It is administered by your care team in a hospital or clinic setting. Talk to your care team about the use of this medication in children. Special care may be needed. Overdosage: If you think you have taken too much of this medicine contact a poison control center or emergency room at once. NOTE: This medicine is only for you. Do not share this medicine with others. What if I miss a dose? Keep appointments for follow-up doses. It is important not to miss your dose. Call your care team if you are unable to keep an appointment. What may interact with this medication? Do not take this medication with any of the following: Live virus vaccines This medication may also interact with the following: Medications that treat or prevent blood clots, such as warfarin, enoxaparin, dalteparin This list may not describe all possible interactions. Give your health care provider a list of all the medicines, herbs, non-prescription drugs, or dietary supplements you use. Also tell them if you smoke, drink alcohol, or use illegal drugs. Some items may interact with your medicine. What should I watch for while using this medication? Your condition will be monitored carefully while you are receiving this medication. This medication may make you feel generally unwell. This is not uncommon as chemotherapy can affect healthy cells as well as  cancer cells. Report any side effects. Continue your course of treatment even though you feel ill unless your care team tells you to stop. In some cases, you may be given additional medications to help with side effects. Follow all directions for their use. This medication may increase your risk of getting an infection. Call your care team for advice if you get a fever, chills, sore throat, or other symptoms of a cold or flu. Do not treat  yourself. Try to avoid being around people who are sick. This medication may increase your risk to bruise or bleed. Call your care team if you notice any unusual bleeding. Be careful brushing or flossing your teeth or using a toothpick because you may get an infection or bleed more easily. If you have any dental work done, tell your dentist you are receiving this medication. Avoid taking medications that contain aspirin, acetaminophen, ibuprofen, naproxen, or ketoprofen unless instructed by your care team. These medications may hide a fever. Do not treat diarrhea with over the counter products. Contact your care team if you have diarrhea that lasts more than 2 days or if it is severe and watery. This medication can make you more sensitive to the sun. Keep out of the sun. If you cannot avoid being in the sun, wear protective clothing and sunscreen. Do not use sun lamps, tanning beds, or tanning booths. Talk to your care team if you or your partner wish to become pregnant or think you might be pregnant. This medication can cause serious birth defects if taken during pregnancy and for 3 months after the last dose. A reliable form of contraception is recommended while taking this medication and for 3 months after the last dose. Talk to your care team about effective forms of contraception. Do not father a child while taking this medication and for 3 months after the last dose. Use a condom while having sex during this time period. Do not breastfeed while taking this  medication. This medication may cause infertility. Talk to your care team if you are concerned about your fertility. What side effects may I notice from receiving this medication? Side effects that you should report to your care team as soon as possible: Allergic reactions--skin rash, itching, hives, swelling of the face, lips, tongue, or throat Heart attack--pain or tightness in the chest, shoulders, arms, or jaw, nausea, shortness of breath, cold or clammy skin, feeling faint or lightheaded Heart failure--shortness of breath, swelling of the ankles, feet, or hands, sudden weight gain, unusual weakness or fatigue Heart rhythm changes--fast or irregular heartbeat, dizziness, feeling faint or lightheaded, chest pain, trouble breathing High ammonia level--unusual weakness or fatigue, confusion, loss of appetite, nausea, vomiting, seizures Infection--fever, chills, cough, sore throat, wounds that don't heal, pain or trouble when passing urine, general feeling of discomfort or being unwell Low red blood cell level--unusual weakness or fatigue, dizziness, headache, trouble breathing Pain, tingling, or numbness in the hands or feet, muscle weakness, change in vision, confusion or trouble speaking, loss of balance or coordination, trouble walking, seizures Redness, swelling, and blistering of the skin over hands and feet Severe or prolonged diarrhea Unusual bruising or bleeding Side effects that usually do not require medical attention (report to your care team if they continue or are bothersome): Dry skin Headache Increased tears Nausea Pain, redness, or swelling with sores inside the mouth or throat Sensitivity to light Vomiting This list may not describe all possible side effects. Call your doctor for medical advice about side effects. You may report side effects to FDA at 1-800-FDA-1088. Where should I keep my medication? This medication is given in a hospital or clinic. It will not be stored  at home. NOTE: This sheet is a summary. It may not cover all possible information. If you have questions about this medicine, talk to your doctor, pharmacist, or health care provider.  2023 Elsevier/Gold Standard (2022-01-08 00:00:00)

## 2022-10-01 NOTE — Progress Notes (Signed)
Ok to give bevacizumab with UP = 100 per Dr [name]

## 2022-10-01 NOTE — Progress Notes (Signed)
Patient seen by Dr. [name] today  Vitals are within treatment parameters.  Labs reviewed by Dr. [name] and are not all within treatment parameters. Urine protein 100 mg/dL.  Per physician team, patient is ready for treatment and there are NO modifications to the treatment plan. Per MD [name], urinalysis added to today's urine protein.

## 2022-10-01 NOTE — Progress Notes (Signed)
Sent script for Cipro 500 mg bid x 5 days to pharmacy. Added urine culture to lab today.

## 2022-10-01 NOTE — Progress Notes (Signed)
[facility] OFFICE PROGRESS NOTE   Diagnosis: Rectal cancer  INTERVAL HISTORY:   [facility] Perry completed another cycle of FOLFIRI/bevacizumab on 09/17/2022.  He reports anorexia for a few days following chemotherapy.  No nausea/vomiting, mouth sores, or diarrhea.  He has mild nose and "hemorrhoid "bleeding.  He had pink urine this morning. No thrush or ulcers  Objective:  Vital signs in last 24 hours:  Blood pressure (!) 154/89, pulse 91, temperature 98.1 F (36.7 C), temperature source Oral, resp. rate 18, height '5\' 9"'$  (1.753 m), weight 151 lb 9.6 oz (68.8 kg), SpO2 100 %. HEENT: No thrush or ulcers Resp: Lungs clear bilaterally Cardio: Regular rate and rhythm GI: No hepatosplenomegaly, nontender Vascular: No leg edema, left lower leg is slightly larger than the right side    Portacath/PICC-without erythema  Lab Results:  Lab Results  Component Value Date   WBC 4.6 10/01/2022   HGB 12.9 (L) 10/01/2022   HCT 38.3 (L) 10/01/2022   MCV 80.8 10/01/2022   PLT 251 10/01/2022   NEUTROABS 3.2 10/01/2022    CMP  Lab Results  Component Value Date   NA 140 09/17/2022   K 3.9 09/17/2022   CL 107 09/17/2022   CO2 25 09/17/2022   GLUCOSE 103 (H) 09/17/2022   BUN 14 09/17/2022   CREATININE 0.91 09/17/2022   CALCIUM 9.2 09/17/2022   PROT 6.3 (L) 09/17/2022   ALBUMIN 3.8 09/17/2022   AST 12 (L) 09/17/2022   ALT 10 09/17/2022   ALKPHOS 66 09/17/2022   BILITOT 0.6 09/17/2022   GFRNONAA >60 09/17/2022    Lab Results  Component Value Date   CEA 104.96 (H) 09/17/2022    Lab Results  Component Value Date   INR 1.0 05/07/2021   LABPROT 13.6 05/07/2021    Imaging:  No results found.  Medications: I have reviewed the patient's current medications.   Assessment/Plan:  Rectal cancer MRI abdomen 04/26/2019 2-3 new hypervascular masses in the liver dome, no abdominal lymphadenopathy, stable benign left adrenal adenoma, stable right lower pole kidney  mass Ultrasound-guided biopsy of a liver lesion 05/07/2021-metastatic adenocarcinoma with extensive necrosis, immunohistochemical profile consistent with a colorectal primary; foundation 1-microsatellite stable, tumor mutation burden 5, K-rasG 12V, NRAS wildtype Colonoscopy 05/22/2021-ulcerated partially obstructing mass at 15 cm from anal verge CTs 05/30/2021-numerous small pulmonary nodules concerning for metastases, thickening of the rectum, multiple liver metastases Cycle 1 FOLFOX 06/12/2021 Cycle 2 FOLFOX 06/26/2021 Cycle 3 FOLFOX 07/10/2021, oxaliplatin dose reduced due to progressive decline in the [place] and platelet count Cycle 4 FOLFOX 07/24/2021 Cycle 5 FOLFOX 08/07/2021, 5-FU bolus eliminated and oxaliplatin dose reduced, insurance would not approve Udenyca CTs 08/15/2021-decrease size of lung nodules, decreased hepatic metastases, persistent anorectal wall thickening, stable right renal mass Cycle 6 FOLFOX 08/21/2021, 5-FU bolus and 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 Cycle 7 FOLFOX 09/04/2021, 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 Cycle 8 FOLFOX 09/18/2021, 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 Cycle 9 FOLFOX 10/08/2021, 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 Cycle 10 FOLFOX 10/23/2021, 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 CTs 11/01/2021-decrease in size of occasional small pulmonary nodules, continued decrease in size of multiple hypoenhancing hepatic metastases, unchanged posttreatment appearance of the low rectum, unchanged exophytic mass of the inferior pole of the right kidney measuring 1.5 x 1.3 cm Cycle 11 5-fluorouracil 11/06/2021 Cycle 12 5-fluorouracil 11/20/2021 Cycle 13 5-fluorouracil 12/04/2021 Maintenance Xeloda beginning 12/19/2021 Xeloda dose reduced to 1000 mg twice daily beginning 12/27/2021 Xeloda placed on hold 01/16/2022 CTs 01/22/2022-segment  of distal ileum with circumferential wall thickening and perienteric  inflammation, decrease in size of liver metastases, stable tiny bilateral pulmonary nodules, circumferential wall thickening in the rectum, stable lower pole right kidney lesion suspicious for renal cell carcinoma Xeloda discontinued due to diarrhea 5-fluorouracil pump 02/05/2022 5-fluorouracil pump 02/26/2022 Treatment held 03/18/2022 due to diarrhea 5-fluorouracil pump 03/25/2022, dose reduced due to diarrhea 5-fluorouracil pump 04/16/2022 CTs 05/03/2022-unchanged tiny pulmonary nodules, unchanged liver metastases, stable appearance of the low rectal wall thickening, unchanged posterior right renal mass 5-fluorouracil pump 05/07/2022 5-fluorouracil pump 05/28/2022 5-fluorouracil pump 06/18/2022 CTs 07/04/2022-increased size of liver metastases, no new lesions, new 3 mm right lower lobe nodule-nonspecific Cycle 1 FOLFIRI/bevacizumab 07/16/2022 Cycle 2 FOLFIRI/bevacizumab 08/05/2022; 5-FU bolus eliminated, irinotecan dose reduced due to diarrhea and weight loss Cycle 3 FOLFIRI/bevacizumab 08/21/2022 Cycle 4 FOLFIRI/bevacizumab 09/03/2022 Cycle 5 FOLFIRI/bevacizumab 09/17/2022 CTs 09/26/2022-stable rectal thickening, stable hepatic metastases (dominant lesion appears smaller by my review), small indeterminate pulmonary nodules, unchanged exophytic enhancing lesion at the lower pole of the right kidney Cycle 6 FOLFIRI/bevacizumab 10/01/2022 Prostate cancer-simple prostatectomy January 2019, Gleason 3+3, 10 to 12% of specimen Active surveillance, biopsy May 2019 with small focus of Gleason 3+3 disease in 1/6 cores, surveillance continued Elevated PSA 04/20/2020 Biopsy 06/19/2020 8/12 cores positive, Gleason 4+4 PET scan 07/07/2020-negative CT 07/07/2020 right iliac and retrocaval adenopathy, 1.3 cm subcapsular right lower pole renal lesion Androgen deprivation therapy beginning 08/21/2020 Radiation to prostate, seminal vesicles, and pelvic lymph nodes to 10/22 - 12/28/2020 He continues every 68-monthFirmagon and  daily Xtandi 08/21/2022 patient reports Xtandi discontinued   3.  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  Mitral valve prolapse 5.  Family history of prostate cancer 6.  08/15/2021 with leg weakness and an episode of slurred speech-TIA?,  Brain imaging negative 7.  Diarrhea and fatigue while on capecitabine April 2023-capecitabine discontinued 01/18/2022     Disposition: Mr. RGilliamappears stable.  He is tolerating the FOLFIRI/bevacizumab well.  The CEA is stable.  I reviewed the restaging CT findings and images with him.  The overall pattern is consistent with stable disease, though the dominant liver metastasis appears smaller by my review.  There are several tiny lung nodules that are indeterminate.  The plan is to continue FOLFIRI/bevacizumab.  He will call for increased bleeding.  We will check a urinalysis today.  [facility] Perry for an office visit and chemotherapy in 2 weeks.  GBetsy Coder MD  10/01/2022  10:03 AM

## 2022-10-02 ENCOUNTER — Telehealth: Payer: Self-pay | Admitting: *Deleted

## 2022-10-02 LAB — URINE CULTURE: Culture: NO GROWTH

## 2022-10-02 NOTE — Telephone Encounter (Signed)
-----   Message from Ladell Pier, MD sent at 10/02/2022  2:18 PM EST ----- Please call patient urine culture is negative, continue ciprofloxacin as prescribed, call for hematuria or other urinary symptoms

## 2022-10-02 NOTE — Telephone Encounter (Signed)
Left VM for patient to return call re: labs

## 2022-10-03 ENCOUNTER — Inpatient Hospital Stay: Payer: Medicare Other

## 2022-10-03 ENCOUNTER — Encounter: Payer: Self-pay | Admitting: *Deleted

## 2022-10-03 VITALS — BP 142/74 | HR 75 | Temp 98.1°F | Resp 18

## 2022-10-03 DIAGNOSIS — Z5112 Encounter for antineoplastic immunotherapy: Secondary | ICD-10-CM | POA: Diagnosis not present

## 2022-10-03 DIAGNOSIS — C20 Malignant neoplasm of rectum: Secondary | ICD-10-CM

## 2022-10-03 MED ORDER — HEPARIN SOD (PORK) LOCK FLUSH 100 UNIT/ML IV SOLN
500.0000 [IU] | Freq: Once | INTRAVENOUS | Status: AC | PRN
  Administered 2022-10-03: 500 [IU]

## 2022-10-03 MED ORDER — SODIUM CHLORIDE 0.9% FLUSH
10.0000 mL | INTRAVENOUS | Status: DC | PRN
  Administered 2022-10-03: 10 mL

## 2022-10-10 ENCOUNTER — Telehealth: Payer: Self-pay

## 2022-10-10 NOTE — Telephone Encounter (Signed)
Patient called in to report some symptoms after treatment: cramping in the abdomen, fatigue diarrhea and chills. Patient states  he want a break for treatment. I advise the pt to keep his appointment, plenty of fluids, continue to take his pain medication.

## 2022-10-13 ENCOUNTER — Other Ambulatory Visit: Payer: Self-pay | Admitting: Oncology

## 2022-10-15 ENCOUNTER — Inpatient Hospital Stay: Payer: Medicare Other

## 2022-10-15 ENCOUNTER — Inpatient Hospital Stay (HOSPITAL_BASED_OUTPATIENT_CLINIC_OR_DEPARTMENT_OTHER): Payer: Medicare Other | Admitting: Nurse Practitioner

## 2022-10-15 ENCOUNTER — Encounter: Payer: Self-pay | Admitting: Nurse Practitioner

## 2022-10-15 VITALS — BP 144/77 | HR 87 | Temp 98.2°F | Resp 18 | Ht 69.0 in | Wt 148.2 lb

## 2022-10-15 DIAGNOSIS — C20 Malignant neoplasm of rectum: Secondary | ICD-10-CM | POA: Diagnosis not present

## 2022-10-15 DIAGNOSIS — R319 Hematuria, unspecified: Secondary | ICD-10-CM | POA: Diagnosis not present

## 2022-10-15 DIAGNOSIS — Z5112 Encounter for antineoplastic immunotherapy: Secondary | ICD-10-CM | POA: Diagnosis not present

## 2022-10-15 LAB — URINALYSIS, COMPLETE (UACMP) WITH MICROSCOPIC
Bacteria, UA: NONE SEEN
Bilirubin Urine: NEGATIVE
Glucose, UA: NEGATIVE mg/dL
Ketones, ur: NEGATIVE mg/dL
Leukocytes,Ua: NEGATIVE
Nitrite: NEGATIVE
Protein, ur: 30 mg/dL — AB
Specific Gravity, Urine: 1.026 (ref 1.005–1.030)
pH: 5 (ref 5.0–8.0)

## 2022-10-15 LAB — CMP (CANCER CENTER ONLY)
ALT: 14 U/L (ref 0–44)
AST: 14 U/L — ABNORMAL LOW (ref 15–41)
Albumin: 4 g/dL (ref 3.5–5.0)
Alkaline Phosphatase: 55 U/L (ref 38–126)
Anion gap: 7 (ref 5–15)
BUN: 14 mg/dL (ref 8–23)
CO2: 26 mmol/L (ref 22–32)
Calcium: 9.4 mg/dL (ref 8.9–10.3)
Chloride: 106 mmol/L (ref 98–111)
Creatinine: 0.94 mg/dL (ref 0.61–1.24)
GFR, Estimated: >60 mL/min (ref >60)
Glucose, Bld: 102 mg/dL — ABNORMAL HIGH (ref 70–99)
Potassium: 3.7 mmol/L (ref 3.5–5.1)
Sodium: 139 mmol/L (ref 135–145)
Total Bilirubin: 0.6 mg/dL (ref 0.3–1.2)
Total Protein: 6.3 g/dL — ABNORMAL LOW (ref 6.5–8.1)

## 2022-10-15 LAB — CBC WITH DIFFERENTIAL (CANCER CENTER ONLY)
Abs Immature Granulocytes: 0.01 10*3/uL (ref 0.00–0.07)
Basophils Absolute: 0 10*3/uL (ref 0.0–0.1)
Basophils Relative: 1 %
Eosinophils Absolute: 0.2 10*3/uL (ref 0.0–0.5)
Eosinophils Relative: 4 %
HCT: 38.4 % — ABNORMAL LOW (ref 39.0–52.0)
Hemoglobin: 13 g/dL (ref 13.0–17.0)
Immature Granulocytes: 0 %
Lymphocytes Relative: 15 %
Lymphs Abs: 0.7 10*3/uL (ref 0.7–4.0)
MCH: 27.6 pg (ref 26.0–34.0)
MCHC: 33.9 g/dL (ref 30.0–36.0)
MCV: 81.5 fL (ref 80.0–100.0)
Monocytes Absolute: 0.3 10*3/uL (ref 0.1–1.0)
Monocytes Relative: 7 %
Neutro Abs: 3.2 10*3/uL (ref 1.7–7.7)
Neutrophils Relative %: 73 %
Platelet Count: 279 10*3/uL (ref 150–400)
RBC: 4.71 MIL/uL (ref 4.22–5.81)
RDW: 17 % — ABNORMAL HIGH (ref 11.5–15.5)
WBC Count: 4.4 10*3/uL (ref 4.0–10.5)
nRBC: 0 % (ref 0.0–0.2)

## 2022-10-15 LAB — TOTAL PROTEIN, URINE DIPSTICK: Protein, ur: 30 mg/dL — AB

## 2022-10-15 MED ORDER — SODIUM CHLORIDE 0.9 % IV SOLN
1600.0000 mg/m2 | INTRAVENOUS | Status: DC
  Administered 2022-10-15: 2900 mg via INTRAVENOUS
  Filled 2022-10-15: qty 58

## 2022-10-15 MED ORDER — SODIUM CHLORIDE 0.9 % IV SOLN
5.2000 mg/kg | Freq: Once | INTRAVENOUS | Status: AC
  Administered 2022-10-15: 350 mg via INTRAVENOUS
  Filled 2022-10-15: qty 14

## 2022-10-15 MED ORDER — ATROPINE SULFATE 1 MG/ML IV SOLN
0.5000 mg | Freq: Once | INTRAVENOUS | Status: AC | PRN
  Administered 2022-10-15: 0.5 mg via INTRAVENOUS
  Filled 2022-10-15: qty 1

## 2022-10-15 MED ORDER — SODIUM CHLORIDE 0.9 % IV SOLN
10.0000 mg | Freq: Once | INTRAVENOUS | Status: AC
  Administered 2022-10-15: 10 mg via INTRAVENOUS
  Filled 2022-10-15: qty 10

## 2022-10-15 MED ORDER — PALONOSETRON HCL INJECTION 0.25 MG/5ML
0.2500 mg | Freq: Once | INTRAVENOUS | Status: AC
  Administered 2022-10-15: 0.25 mg via INTRAVENOUS
  Filled 2022-10-15: qty 5

## 2022-10-15 MED ORDER — SODIUM CHLORIDE 0.9 % IV SOLN
400.0000 mg/m2 | Freq: Once | INTRAVENOUS | Status: AC
  Administered 2022-10-15: 724 mg via INTRAVENOUS
  Filled 2022-10-15: qty 36.2

## 2022-10-15 MED ORDER — SODIUM CHLORIDE 0.9 % IV SOLN
100.0000 mg/m2 | Freq: Once | INTRAVENOUS | Status: AC
  Administered 2022-10-15: 180 mg via INTRAVENOUS
  Filled 2022-10-15: qty 5

## 2022-10-15 MED ORDER — SODIUM CHLORIDE 0.9 % IV SOLN: Freq: Once | INTRAVENOUS | Status: AC

## 2022-10-15 NOTE — Progress Notes (Signed)
[facility] OFFICE PROGRESS NOTE   Diagnosis: Rectal cancer  INTERVAL HISTORY:   Neil Perry returns as scheduled.  He completed cycle 6 FOLFIRI/bevacizumab 10/01/2022.  He had mild nausea.  No mouth sores.  He developed diarrhea and crampy abdominal pain on day 3.  He was having up to 10 loose stools a day.  This lasted for 2 to 3 days.  He noted some relief with Lomotil.  Objective:  Vital signs in last 24 hours:  Blood pressure (!) 144/77, pulse 87, temperature 98.2 F (36.8 C), temperature source Oral, resp. rate 18, height '5\' 9"'$  (1.753 m), weight 148 lb 3.2 oz (67.2 kg), SpO2 100 %.    HEENT: No thrush or ulcers.  Mucous membranes are moist. Resp: Lungs clear bilaterally. Cardio: Regular rate and rhythm. GI: Abdomen soft and nontender.  No hepatosplenomegaly. Vascular: No leg edema. Skin: Palms without erythema. Port-A-Cath without erythema.  Lab Results:  Lab Results  Component Value Date   WBC 4.4 10/15/2022   HGB 13.0 10/15/2022   HCT 38.4 (L) 10/15/2022   MCV 81.5 10/15/2022   PLT 279 10/15/2022   NEUTROABS 3.2 10/15/2022    Imaging:  No results found.  Medications: I have reviewed the patient's current medications.  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; foundation 1-microsatellite stable, tumor mutation burden 5, K-rasG 12V, NRAS wildtype Colonoscopy 05/22/2021-ulcerated partially obstructing mass at 15 cm from anal verge CTs 05/30/2021-numerous small pulmonary nodules concerning for metastases, thickening of the rectum, multiple liver metastases Cycle 1 FOLFOX 06/12/2021 Cycle 2 FOLFOX 06/26/2021 Cycle 3 FOLFOX  07/10/2021, oxaliplatin dose reduced due to progressive decline in the [place] and platelet count Cycle 4 FOLFOX 07/24/2021 Cycle 5 FOLFOX 08/07/2021, 5-FU bolus eliminated and oxaliplatin dose reduced, insurance would not approve Udenyca CTs 08/15/2021-decrease size of lung nodules, decreased hepatic metastases, persistent anorectal wall thickening, stable right renal mass Cycle 6 FOLFOX 08/21/2021, 5-FU bolus and 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 Cycle 7 FOLFOX 09/04/2021, 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 Cycle 8 FOLFOX 09/18/2021, 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 Cycle 9 FOLFOX 10/08/2021, 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 Cycle 10 FOLFOX 10/23/2021, 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 CTs 11/01/2021-decrease in size of occasional small pulmonary nodules, continued decrease in size of multiple hypoenhancing hepatic metastases, unchanged posttreatment appearance of the low rectum, unchanged exophytic mass of the inferior pole of the right kidney measuring 1.5 x 1.3 cm Cycle 11 5-fluorouracil 11/06/2021 Cycle 12 5-fluorouracil 11/20/2021 Cycle 13 5-fluorouracil 12/04/2021 Maintenance Xeloda beginning 12/19/2021 Xeloda dose reduced to 1000 mg twice daily beginning 12/27/2021 Xeloda placed on hold 01/16/2022 CTs 01/22/2022-segment of distal ileum with circumferential wall thickening and perienteric inflammation, decrease in size of liver metastases, stable tiny bilateral pulmonary nodules, circumferential wall thickening in the rectum, stable lower pole right kidney lesion suspicious for renal cell carcinoma Xeloda discontinued due to diarrhea 5-fluorouracil pump 02/05/2022 5-fluorouracil pump 02/26/2022 Treatment held 03/18/2022 due to diarrhea 5-fluorouracil pump 03/25/2022, dose reduced due to diarrhea 5-fluorouracil pump 04/16/2022 CTs 05/03/2022-unchanged tiny pulmonary nodules, unchanged liver metastases, stable appearance  of the low rectal wall thickening, unchanged posterior right renal mass 5-fluorouracil pump 05/07/2022 5-fluorouracil pump 05/28/2022 5-fluorouracil pump 06/18/2022 CTs 07/04/2022-increased size of liver metastases, no new lesions, new 3 mm  right lower lobe nodule-nonspecific Cycle 1 FOLFIRI/bevacizumab 07/16/2022 Cycle 2 FOLFIRI/bevacizumab 08/05/2022; 5-FU bolus eliminated, irinotecan dose reduced due to diarrhea and weight loss Cycle 3 FOLFIRI/bevacizumab 08/21/2022 Cycle 4 FOLFIRI/bevacizumab 09/03/2022 Cycle 5 FOLFIRI/bevacizumab 09/17/2022 CTs 09/26/2022-stable rectal thickening, stable hepatic metastases (dominant lesion appears smaller by my review), small indeterminate pulmonary nodules, unchanged exophytic enhancing lesion at the lower pole of the right kidney Cycle 6 FOLFIRI/bevacizumab 10/01/2022 Cycle 7 FOLFIRI/bevacizumab 10/15/2022, irinotecan dose reduced due to diarrhea Prostate cancer-simple prostatectomy January 2019, Gleason 3+3, 10 to 12% of specimen Active surveillance, biopsy May 2019 with small focus of Gleason 3+3 disease in 1/6 cores, surveillance continued Elevated PSA 04/20/2020 Biopsy 06/19/2020 8/12 cores positive, Gleason 4+4 PET scan 07/07/2020-negative CT 07/07/2020 right iliac and retrocaval adenopathy, 1.3 cm subcapsular right lower pole renal lesion Androgen deprivation therapy beginning 08/21/2020 Radiation to prostate, seminal vesicles, and pelvic lymph nodes to 10/22 - 12/28/2020 He continues every 77-monthFirmagon and daily Xtandi 08/21/2022 patient reports Xtandi discontinued   3.  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  Mitral valve prolapse 5.  Family history of prostate cancer 6.  08/15/2021 with leg weakness and an episode of slurred speech-TIA?,  Brain imaging negative 7.  Diarrhea and fatigue while on capecitabine April 2023-capecitabine discontinued 01/18/2022    Disposition:  Mr. RGuerriniappears stable.  He has completed 6 cycles of FOLFIRI/bevacizumab.  There is no clinical evidence of disease progression.  He had significant diarrhea following the most recent chemotherapy.  This was likely related to the irinotecan.  Irinotecan will be dose reduced with today's treatment.  He agrees with this plan.  CBC and chemistry panel reviewed.  Labs adequate to proceed as above.  He will return for lab, follow-up, treatment in 2 weeks.  We are available to see him sooner if needed.  He understands to contact the office with poorly controlled diarrhea.    LNed CardANP/GNP-BC   10/15/2022  11:24 AM

## 2022-10-15 NOTE — Patient Instructions (Addendum)
[place]  The chemotherapy medication bag should finish at 46 hours, 96 hours, or 7 days. For example, if your pump is scheduled for 46 hours and it was put on at 4:00 p.m., it should finish at 2:00 p.m. the day it is scheduled to come off regardless of your appointment time.     Estimated time to finish at 12:15pm, Thursday, October 17, 2022.   If the display on your pump reads "Low Volume" and it is beeping, take the batteries out of the pump and come to the cancer center for it to be taken off.   If the pump alarms go off prior to the pump reading "Low Volume" then call [phone] and someone can assist you.  If the plunger comes out and the chemotherapy medication is leaking out, please use your home chemo spill kit to clean up the spill. Do NOT use paper towels or other household products.  If you have problems or questions regarding your pump, please call either [phone] (24 hours a day) or the cancer center Monday-Friday 8:00 a.m.- 4:30 p.m. at the clinic number and we will assist you. If you are unable to get assistance, then go to the nearest Emergency Department and ask the staff to contact the IV team for assistance.   Discharge Instructions: Thank you for choosing [facility] to provide your oncology and hematology care.   If you have a lab appointment with the [facility], please go directly to the [facility] and check in at the registration area.   Wear comfortable clothing and clothing appropriate for easy access to any Portacath or PICC line.   We strive to give you quality time with your provider. You may need to reschedule your appointment if you arrive late (15 or more minutes).  Arriving late affects you and other patients whose appointments are after yours.  Also, if you miss three or more appointments without notifying the office, you may be dismissed from the clinic at the provider's discretion.      For  prescription refill requests, have your pharmacy contact our office and allow 72 hours for refills to be completed.    Today you received the following chemotherapy and/or immunotherapy agents Avastin, Irinotecan, Leucovorin, Fluorouracil.      To help prevent nausea and vomiting after your treatment, we encourage you to take your nausea medication as directed.  BELOW ARE SYMPTOMS THAT SHOULD BE REPORTED IMMEDIATELY: *FEVER GREATER THAN 100.4 F (38 C) OR HIGHER *CHILLS OR SWEATING *NAUSEA AND VOMITING THAT IS NOT CONTROLLED WITH YOUR NAUSEA MEDICATION *UNUSUAL SHORTNESS OF BREATH *UNUSUAL BRUISING OR BLEEDING *URINARY PROBLEMS (pain or burning when urinating, or frequent urination) *BOWEL PROBLEMS (unusual diarrhea, constipation, pain near the anus) TENDERNESS IN MOUTH AND THROAT WITH OR WITHOUT PRESENCE OF ULCERS (sore throat, sores in mouth, or a toothache) UNUSUAL RASH, SWELLING OR PAIN  UNUSUAL VAGINAL DISCHARGE OR ITCHING   Items with * indicate a potential emergency and should be followed up as soon as possible or go to the Emergency Department if any problems should occur.  Please show the CHEMOTHERAPY ALERT CARD or IMMUNOTHERAPY ALERT CARD at check-in to the Emergency Department and triage nurse.  Should you have questions after your visit or need to cancel or reschedule your appointment, please contact [facility]  Dept: [phone]  and follow the prompts.  Office hours are 8:00 a.m. to 4:30 p.m. Monday - Friday. Please  note that voicemails left after 4:00 p.m. may not be returned until the following business day.  We are closed weekends and major holidays. You have access to a nurse at all times for urgent questions. Please call the main number to the clinic Dept: [phone] and follow the prompts.   For any non-urgent questions, you may also contact your provider using MyChart. We now offer e-Visits for anyone 71 and older to request care  online for non-urgent symptoms. For details visit [email or web address].   Also download the MyChart app! Go to the app store, search "MyChart", open the app, select [facility], and log in with your MyChart username and password.  Bevacizumab Injection What is this medication? BEVACIZUMAB (be va SIZ yoo mab) treats some types of cancer. It works by blocking a protein that causes cancer cells to grow and multiply. This helps to slow or stop the spread of cancer cells. It is a monoclonal antibody. This medicine may be used for other purposes; ask your health care provider or pharmacist if you have questions. COMMON BRAND NAME(S): Alymsys, Avastin, MVASI, Noah Charon What should I tell my care team before I take this medication? They need to know if you have any of these conditions: Blood clots Coughing up blood Having or recent surgery Heart failure High blood pressure History of a connection between 2 or more body parts that do not usually connect (fistula) History of a tear in your stomach or intestines Protein in your urine An unusual or allergic reaction to bevacizumab, other medications, foods, dyes, or preservatives Pregnant or trying to get pregnant Breast-feeding How should I use this medication? This medication is injected into a vein. It is given by your care team in a hospital or clinic setting. Talk to your care team the use of this medication in children. Special care may be needed. Overdosage: If you think you have taken too much of this medicine contact a poison control center or emergency room at once. NOTE: This medicine is only for you. Do not share this medicine with others. What if I miss a dose? Keep appointments for follow-up doses. It is important not to miss your dose. Call your care team if you are unable to keep an appointment. What may interact with this medication? Interactions are not expected. This list may not describe all possible interactions. Give your  health care provider a list of all the medicines, herbs, non-prescription drugs, or dietary supplements you use. Also tell them if you smoke, drink alcohol, or use illegal drugs. Some items may interact with your medicine. What should I watch for while using this medication? Your condition will be monitored carefully while you are receiving this medication. You may need blood work while taking this medication. This medication may make you feel generally unwell. This is not uncommon as chemotherapy can affect healthy cells as well as cancer cells. Report any side effects. Continue your course of treatment even though you feel ill unless your care team tells you to stop. This medication may increase your risk to bruise or bleed. Call your care team if you notice any unusual bleeding. Before having surgery, talk to your care team to make sure it is ok. This medication can increase the risk of poor healing of your surgical site or wound. You will need to stop this medication for 28 days before surgery. After surgery, wait at least 28 days before restarting this medication. Make sure the surgical site or wound is healed enough before restarting  this medication. Talk to your care team if questions. Talk to your care team if you may be pregnant. Serious birth defects can occur if you take this medication during pregnancy and for 6 months after the last dose. Contraception is recommended while taking this medication and for 6 months after the last dose. Your care team can help you find the option that works for you. Do not breastfeed while taking this medication and for 6 months after the last dose. This medication can cause infertility. Talk to your care team if you are concerned about your fertility. What side effects may I notice from receiving this medication? Side effects that you should report to your care team as soon as possible: Allergic reactions--skin rash, itching, hives, swelling of the face, lips,  tongue, or throat Bleeding--bloody or black, tar-like stools, vomiting blood or brown material that looks like coffee grounds, red or dark brown urine, small red or purple spots on skin, unusual bruising or bleeding Blood clot--pain, swelling, or warmth in the leg, shortness of breath, chest pain Heart attack--pain or tightness in the chest, shoulders, arms, or jaw, nausea, shortness of breath, cold or clammy skin, feeling faint or lightheaded Heart failure--shortness of breath, swelling of the ankles, feet, or hands, sudden weight gain, unusual weakness or fatigue Increase in blood pressure Infection--fever, chills, cough, sore throat, wounds that don't heal, pain or trouble when passing urine, general feeling of discomfort or being unwell Infusion reactions--chest pain, shortness of breath or trouble breathing, feeling faint or lightheaded Kidney injury--decrease in the amount of urine, swelling of the ankles, hands, or feet Stomach pain that is severe, does not go away, or gets worse Stroke--sudden numbness or weakness of the face, arm, or leg, trouble speaking, confusion, trouble walking, loss of balance or coordination, dizziness, severe headache, change in vision Sudden and severe headache, confusion, change in vision, seizures, which may be signs of posterior reversible encephalopathy syndrome (PRES) Side effects that usually do not require medical attention (report to your care team if they continue or are bothersome): Back pain Change in taste Diarrhea Dry skin Increased tears Nosebleed This list may not describe all possible side effects. Call your doctor for medical advice about side effects. You may report side effects to FDA at 1-800-FDA-1088. Where should I keep my medication? This medication is given in a hospital or clinic. It will not be stored at home. NOTE: This sheet is a summary. It may not cover all possible information. If you have questions about this medicine, talk to  your doctor, pharmacist, or health care provider.  2023 Elsevier/Gold Standard (2022-01-11 00:00:00)  Irinotecan Injection What is this medication? IRINOTECAN (ir in oh TEE kan) treats some types of cancer. It works by slowing down the growth of cancer cells. This medicine may be used for other purposes; ask your health care provider or pharmacist if you have questions. COMMON BRAND NAME(S): Camptosar What should I tell my care team before I take this medication? They need to know if you have any of these conditions: Dehydration Diarrhea Infection, especially a viral infection, such as chickenpox, cold sores, herpes Liver disease Low blood cell levels (white cells, red cells, and platelets) Low levels of electrolytes, such as calcium, magnesium, or potassium in your blood Recent or ongoing radiation An unusual or allergic reaction to irinotecan, other medications, foods, dyes, or preservatives If you or your partner are pregnant or trying to get pregnant Breast-feeding How should I use this medication? This medication is injected  into a vein. It is given by your care team in a hospital or clinic setting. Talk to your care team about the use of this medication in children. Special care may be needed. Overdosage: If you think you have taken too much of this medicine contact a poison control center or emergency room at once. NOTE: This medicine is only for you. Do not share this medicine with others. What if I miss a dose? Keep appointments for follow-up doses. It is important not to miss your dose. Call your care team if you are unable to keep an appointment. What may interact with this medication? Do not take this medication with any of the following: Cobicistat Itraconazole This medication may also interact with the following: Certain antibiotics, such as clarithromycin, rifampin, rifabutin Certain antivirals for HIV or AIDS Certain medications for fungal infections, such as  ketoconazole, posaconazole, voriconazole Certain medications for seizures, such as carbamazepine, phenobarbital, phenytoin Gemfibrozil Nefazodone St. John's wort This list may not describe all possible interactions. Give your health care provider a list of all the medicines, herbs, non-prescription drugs, or dietary supplements you use. Also tell them if you smoke, drink alcohol, or use illegal drugs. Some items may interact with your medicine. What should I watch for while using this medication? Your condition will be monitored carefully while you are receiving this medication. You may need blood work while taking this medication. This medication may make you feel generally unwell. This is not uncommon as chemotherapy can affect healthy cells as well as cancer cells. Report any side effects. Continue your course of treatment even though you feel ill unless your care team tells you to stop. This medication can cause serious side effects. To reduce the risk, your care team may give you other medications to take before receiving this one. Be sure to follow the directions from your care team. This medication may affect your coordination, reaction time, or judgement. Do not drive or operate machinery until you know how this medication affects you. Sit up or stand slowly to reduce the risk of dizzy or fainting spells. Drinking alcohol with this medication can increase the risk of these side effects. This medication may increase your risk of getting an infection. Call your care team for advice if you get a fever, chills, sore throat, or other symptoms of a cold or flu. Do not treat yourself. Try to avoid being around people who are sick. Avoid taking medications that contain aspirin, acetaminophen, ibuprofen, naproxen, or ketoprofen unless instructed by your care team. These medications may hide a fever. This medication may increase your risk to bruise or bleed. Call your care team if you notice any unusual  bleeding. Be careful brushing or flossing your teeth or using a toothpick because you may get an infection or bleed more easily. If you have any dental work done, tell your dentist you are receiving this medication. Talk to your care team if you or your partner are pregnant or think either of you might be pregnant. This medication can cause serious birth defects if taken during pregnancy and for 6 months after the last dose. You will need a negative pregnancy test before starting this medication. Contraception is recommended while taking this medication and for 6 months after the last dose. Your care team can help you find the option that works for you. Do not father a child while taking this medication and for 3 months after the last dose. Use a condom for contraception during this time  period. Do not breastfeed while taking this medication and for 7 days after the last dose. This medication may cause infertility. Talk to your care team if you are concerned about your fertility. What side effects may I notice from receiving this medication? Side effects that you should report to your care team as soon as possible: Allergic reactions--skin rash, itching, hives, swelling of the face, lips, tongue, or throat Dry cough, shortness of breath or trouble breathing Increased saliva or tears, increased sweating, stomach cramping, diarrhea, small pupils, unusual weakness or fatigue, slow heartbeat Infection--fever, chills, cough, sore throat, wounds that don't heal, pain or trouble when passing urine, general feeling of discomfort or being unwell Kidney injury--decrease in the amount of urine, swelling of the ankles, hands, or feet Low red blood cell level--unusual weakness or fatigue, dizziness, headache, trouble breathing Severe or prolonged diarrhea Unusual bruising or bleeding Side effects that usually do not require medical attention (report to your care team if they continue or are  bothersome): Constipation Diarrhea Hair loss Loss of appetite Nausea Stomach pain This list may not describe all possible side effects. Call your doctor for medical advice about side effects. You may report side effects to FDA at 1-800-FDA-1088. Where should I keep my medication? This medication is given in a hospital or clinic. It will not be stored at home. NOTE: This sheet is a summary. It may not cover all possible information. If you have questions about this medicine, talk to your doctor, pharmacist, or health care provider.  2023 Elsevier/Gold Standard (2022-01-17 00:00:00)  Leucovorin Injection What is this medication? LEUCOVORIN (loo koe VOR in) prevents side effects from certain medications, such as methotrexate. It works by increasing folate levels. This helps protect healthy cells in your body. It may also be used to treat anemia caused by low levels of folate. It can also be used with fluorouracil, a type of chemotherapy, to treat colorectal cancer. It works by increasing the effects of fluorouracil in the body. This medicine may be used for other purposes; ask your health care provider or pharmacist if you have questions. What should I tell my care team before I take this medication? They need to know if you have any of these conditions: Anemia from low levels of vitamin B12 in the blood An unusual or allergic reaction to leucovorin, folic acid, other medications, foods, dyes, or preservatives Pregnant or trying to get pregnant Breastfeeding How should I use this medication? This medication is injected into a vein or a muscle. It is given by your care team in a hospital or clinic setting. Talk to your care team about the use of this medication in children. Special care may be needed. Overdosage: If you think you have taken too much of this medicine contact a poison control center or emergency room at once. NOTE: This medicine is only for you. Do not share this medicine with  others. What if I miss a dose? Keep appointments for follow-up doses. It is important not to miss your dose. Call your care team if you are unable to keep an appointment. What may interact with this medication? Capecitabine Fluorouracil Phenobarbital Phenytoin Primidone Trimethoprim;sulfamethoxazole This list may not describe all possible interactions. Give your health care provider a list of all the medicines, herbs, non-prescription drugs, or dietary supplements you use. Also tell them if you smoke, drink alcohol, or use illegal drugs. Some items may interact with your medicine. What should I watch for while using this medication? Your condition will  be monitored carefully while you are receiving this medication. This medication may increase the side effects of 5-fluorouracil. Tell your care team if you have diarrhea or mouth sores that do not get better or that get worse. What side effects may I notice from receiving this medication? Side effects that you should report to your care team as soon as possible: Allergic reactions--skin rash, itching, hives, swelling of the face, lips, tongue, or throat This list may not describe all possible side effects. Call your doctor for medical advice about side effects. You may report side effects to FDA at 1-800-FDA-1088. Where should I keep my medication? This medication is given in a hospital or clinic. It will not be stored at home. NOTE: This sheet is a summary. It may not cover all possible information. If you have questions about this medicine, talk to your doctor, pharmacist, or health care provider.  2023 Elsevier/Gold Standard (2022-02-12 00:00:00)  Fluorouracil Injection What is this medication? FLUOROURACIL (flure oh YOOR a sil) treats some types of cancer. It works by slowing down the growth of cancer cells. This medicine may be used for other purposes; ask your health care provider or pharmacist if you have questions. COMMON BRAND  NAME(S): Adrucil What should I tell my care team before I take this medication? They need to know if you have any of these conditions: Blood disorders Dihydropyrimidine dehydrogenase (DPD) deficiency Infection, such as chickenpox, cold sores, herpes Kidney disease Liver disease Poor nutrition Recent or ongoing radiation therapy An unusual or allergic reaction to fluorouracil, other medications, foods, dyes, or preservatives If you or your partner are pregnant or trying to get pregnant Breast-feeding How should I use this medication? This medication is injected into a vein. It is administered by your care team in a hospital or clinic setting. Talk to your care team about the use of this medication in children. Special care may be needed. Overdosage: If you think you have taken too much of this medicine contact a poison control center or emergency room at once. NOTE: This medicine is only for you. Do not share this medicine with others. What if I miss a dose? Keep appointments for follow-up doses. It is important not to miss your dose. Call your care team if you are unable to keep an appointment. What may interact with this medication? Do not take this medication with any of the following: Live virus vaccines This medication may also interact with the following: Medications that treat or prevent blood clots, such as warfarin, enoxaparin, dalteparin This list may not describe all possible interactions. Give your health care provider a list of all the medicines, herbs, non-prescription drugs, or dietary supplements you use. Also tell them if you smoke, drink alcohol, or use illegal drugs. Some items may interact with your medicine. What should I watch for while using this medication? Your condition will be monitored carefully while you are receiving this medication. This medication may make you feel generally unwell. This is not uncommon as chemotherapy can affect healthy cells as well as  cancer cells. Report any side effects. Continue your course of treatment even though you feel ill unless your care team tells you to stop. In some cases, you may be given additional medications to help with side effects. Follow all directions for their use. This medication may increase your risk of getting an infection. Call your care team for advice if you get a fever, chills, sore throat, or other symptoms of a cold or flu. Do  not treat yourself. Try to avoid being around people who are sick. This medication may increase your risk to bruise or bleed. Call your care team if you notice any unusual bleeding. Be careful brushing or flossing your teeth or using a toothpick because you may get an infection or bleed more easily. If you have any dental work done, tell your dentist you are receiving this medication. Avoid taking medications that contain aspirin, acetaminophen, ibuprofen, naproxen, or ketoprofen unless instructed by your care team. These medications may hide a fever. Do not treat diarrhea with over the counter products. Contact your care team if you have diarrhea that lasts more than 2 days or if it is severe and watery. This medication can make you more sensitive to the sun. Keep out of the sun. If you cannot avoid being in the sun, wear protective clothing and sunscreen. Do not use sun lamps, tanning beds, or tanning booths. Talk to your care team if you or your partner wish to become pregnant or think you might be pregnant. This medication can cause serious birth defects if taken during pregnancy and for 3 months after the last dose. A reliable form of contraception is recommended while taking this medication and for 3 months after the last dose. Talk to your care team about effective forms of contraception. Do not father a child while taking this medication and for 3 months after the last dose. Use a condom while having sex during this time period. Do not breastfeed while taking this  medication. This medication may cause infertility. Talk to your care team if you are concerned about your fertility. What side effects may I notice from receiving this medication? Side effects that you should report to your care team as soon as possible: Allergic reactions--skin rash, itching, hives, swelling of the face, lips, tongue, or throat Heart attack--pain or tightness in the chest, shoulders, arms, or jaw, nausea, shortness of breath, cold or clammy skin, feeling faint or lightheaded Heart failure--shortness of breath, swelling of the ankles, feet, or hands, sudden weight gain, unusual weakness or fatigue Heart rhythm changes--fast or irregular heartbeat, dizziness, feeling faint or lightheaded, chest pain, trouble breathing High ammonia level--unusual weakness or fatigue, confusion, loss of appetite, nausea, vomiting, seizures Infection--fever, chills, cough, sore throat, wounds that don't heal, pain or trouble when passing urine, general feeling of discomfort or being unwell Low red blood cell level--unusual weakness or fatigue, dizziness, headache, trouble breathing Pain, tingling, or numbness in the hands or feet, muscle weakness, change in vision, confusion or trouble speaking, loss of balance or coordination, trouble walking, seizures Redness, swelling, and blistering of the skin over hands and feet Severe or prolonged diarrhea Unusual bruising or bleeding Side effects that usually do not require medical attention (report to your care team if they continue or are bothersome): Dry skin Headache Increased tears Nausea Pain, redness, or swelling with sores inside the mouth or throat Sensitivity to light Vomiting This list may not describe all possible side effects. Call your doctor for medical advice about side effects. You may report side effects to FDA at 1-800-FDA-1088. Where should I keep my medication? This medication is given in a hospital or clinic. It will not be stored  at home. NOTE: This sheet is a summary. It may not cover all possible information. If you have questions about this medicine, talk to your doctor, pharmacist, or health care provider.  2023 Elsevier/Gold Standard (2022-01-08 00:00:00)

## 2022-10-15 NOTE — Progress Notes (Signed)
Patient seen by Ned Card NP today  Vitals are within treatment parameters.  Labs reviewed by Ned Card NP and are within treatment parameters.  Per physician team, patient is ready for treatment. Please note that modifications are being made to the treatment plan including dose reduction today.

## 2022-10-15 NOTE — Patient Instructions (Signed)

## 2022-10-16 LAB — URINE CULTURE: Culture: NO GROWTH

## 2022-10-17 ENCOUNTER — Inpatient Hospital Stay: Payer: Medicare Other

## 2022-10-17 ENCOUNTER — Encounter: Payer: Self-pay | Admitting: *Deleted

## 2022-10-17 VITALS — BP 145/80 | HR 67 | Temp 97.1°F | Resp 18

## 2022-10-17 DIAGNOSIS — C20 Malignant neoplasm of rectum: Secondary | ICD-10-CM

## 2022-10-17 DIAGNOSIS — Z5112 Encounter for antineoplastic immunotherapy: Secondary | ICD-10-CM | POA: Diagnosis not present

## 2022-10-17 MED ORDER — HEPARIN SOD (PORK) LOCK FLUSH 100 UNIT/ML IV SOLN
500.0000 [IU] | Freq: Once | INTRAVENOUS | Status: AC | PRN
  Administered 2022-10-17: 500 [IU]

## 2022-10-17 MED ORDER — SODIUM CHLORIDE 0.9% FLUSH
10.0000 mL | INTRAVENOUS | Status: DC | PRN
  Administered 2022-10-17: 10 mL

## 2022-10-17 NOTE — Patient Instructions (Signed)

## 2022-10-17 NOTE — Progress Notes (Signed)
Faxed referral order, demographics and chart information to [facility] [phone] for Dr. Marlou Starks for hemorrhoid issue.

## 2022-10-27 ENCOUNTER — Other Ambulatory Visit: Payer: Self-pay | Admitting: Oncology

## 2022-10-29 ENCOUNTER — Other Ambulatory Visit: Payer: Self-pay

## 2022-10-29 ENCOUNTER — Inpatient Hospital Stay: Payer: Medicare Other | Admitting: Licensed Clinical Social Worker

## 2022-10-29 ENCOUNTER — Inpatient Hospital Stay: Payer: Medicare Other | Attending: Nurse Practitioner

## 2022-10-29 ENCOUNTER — Inpatient Hospital Stay (HOSPITAL_BASED_OUTPATIENT_CLINIC_OR_DEPARTMENT_OTHER): Payer: Medicare Other | Admitting: Oncology

## 2022-10-29 ENCOUNTER — Inpatient Hospital Stay: Payer: Medicare Other

## 2022-10-29 VITALS — BP 140/90 | HR 60 | Temp 98.1°F | Resp 20 | Ht 69.0 in | Wt 144.0 lb

## 2022-10-29 DIAGNOSIS — K5900 Constipation, unspecified: Secondary | ICD-10-CM | POA: Diagnosis not present

## 2022-10-29 DIAGNOSIS — R5381 Other malaise: Secondary | ICD-10-CM | POA: Diagnosis not present

## 2022-10-29 DIAGNOSIS — C20 Malignant neoplasm of rectum: Secondary | ICD-10-CM

## 2022-10-29 DIAGNOSIS — R197 Diarrhea, unspecified: Secondary | ICD-10-CM | POA: Insufficient documentation

## 2022-10-29 DIAGNOSIS — Z5111 Encounter for antineoplastic chemotherapy: Secondary | ICD-10-CM | POA: Insufficient documentation

## 2022-10-29 DIAGNOSIS — Z85528 Personal history of other malignant neoplasm of kidney: Secondary | ICD-10-CM | POA: Insufficient documentation

## 2022-10-29 DIAGNOSIS — R110 Nausea: Secondary | ICD-10-CM | POA: Diagnosis not present

## 2022-10-29 DIAGNOSIS — Z95828 Presence of other vascular implants and grafts: Secondary | ICD-10-CM

## 2022-10-29 DIAGNOSIS — I341 Nonrheumatic mitral (valve) prolapse: Secondary | ICD-10-CM | POA: Diagnosis not present

## 2022-10-29 DIAGNOSIS — N2889 Other specified disorders of kidney and ureter: Secondary | ICD-10-CM | POA: Insufficient documentation

## 2022-10-29 DIAGNOSIS — R319 Hematuria, unspecified: Secondary | ICD-10-CM

## 2022-10-29 LAB — CBC WITH DIFFERENTIAL (CANCER CENTER ONLY)
Abs Immature Granulocytes: 0.01 10*3/uL (ref 0.00–0.07)
Basophils Absolute: 0 10*3/uL (ref 0.0–0.1)
Basophils Relative: 1 %
Eosinophils Absolute: 0.1 10*3/uL (ref 0.0–0.5)
Eosinophils Relative: 3 %
HCT: 37.6 % — ABNORMAL LOW (ref 39.0–52.0)
Hemoglobin: 13 g/dL (ref 13.0–17.0)
Immature Granulocytes: 0 %
Lymphocytes Relative: 15 %
Lymphs Abs: 0.6 10*3/uL — ABNORMAL LOW (ref 0.7–4.0)
MCH: 28.1 pg (ref 26.0–34.0)
MCHC: 34.6 g/dL (ref 30.0–36.0)
MCV: 81.4 fL (ref 80.0–100.0)
Monocytes Absolute: 0.5 10*3/uL (ref 0.1–1.0)
Monocytes Relative: 11 %
Neutro Abs: 3 10*3/uL (ref 1.7–7.7)
Neutrophils Relative %: 70 %
Platelet Count: 247 10*3/uL (ref 150–400)
RBC: 4.62 MIL/uL (ref 4.22–5.81)
RDW: 17.1 % — ABNORMAL HIGH (ref 11.5–15.5)
WBC Count: 4.2 10*3/uL (ref 4.0–10.5)
nRBC: 0 % (ref 0.0–0.2)

## 2022-10-29 LAB — CMP (CANCER CENTER ONLY)
ALT: 10 U/L (ref 0–44)
AST: 12 U/L — ABNORMAL LOW (ref 15–41)
Albumin: 4 g/dL (ref 3.5–5.0)
Alkaline Phosphatase: 59 U/L (ref 38–126)
Anion gap: 10 (ref 5–15)
BUN: 18 mg/dL (ref 8–23)
CO2: 23 mmol/L (ref 22–32)
Calcium: 9.3 mg/dL (ref 8.9–10.3)
Chloride: 105 mmol/L (ref 98–111)
Creatinine: 0.99 mg/dL (ref 0.61–1.24)
GFR, Estimated: >60 mL/min (ref >60)
Glucose, Bld: 111 mg/dL — ABNORMAL HIGH (ref 70–99)
Potassium: 3.5 mmol/L (ref 3.5–5.1)
Sodium: 138 mmol/L (ref 135–145)
Total Bilirubin: 0.8 mg/dL (ref 0.3–1.2)
Total Protein: 6.4 g/dL — ABNORMAL LOW (ref 6.5–8.1)

## 2022-10-29 LAB — CEA (ACCESS): CEA (CHCC): 84.23 ng/mL — ABNORMAL HIGH (ref 0.00–5.00)

## 2022-10-29 LAB — TOTAL PROTEIN, URINE DIPSTICK: Protein, ur: >300 mg/dL — AB

## 2022-10-29 MED ORDER — SODIUM CHLORIDE 0.9% FLUSH
10.0000 mL | INTRAVENOUS | Status: DC | PRN
  Administered 2022-10-29: 10 mL via INTRAVENOUS

## 2022-10-29 MED ORDER — HEPARIN SOD (PORK) LOCK FLUSH 100 UNIT/ML IV SOLN
500.0000 [IU] | Freq: Once | INTRAVENOUS | Status: AC
  Administered 2022-10-29: 500 [IU] via INTRAVENOUS

## 2022-10-29 NOTE — Progress Notes (Signed)
Patient seen by Dr. Sherrill today  Vitals are within treatment parameters.  Labs reviewed by Dr. Sherrill and are within treatment parameters.  Per physician team, patient will not be receiving treatment today.  

## 2022-10-29 NOTE — Progress Notes (Signed)
[place] Work  Clinical Social Work was referred by medical provider for assessment of psychosocial needs.  Clinical Social Worker contacted patient by phone to offer support and assess for needs.    CSW received referral from Dr. [name] stating that patient was interested in a caregiver support group.  Advance Directives were also going to be discussed.  Patient reported not sleeping well last night and requested CSW call back tomorrow.     Margaree Mackintosh, LCSW  Clinical Social Worker [facility]

## 2022-10-29 NOTE — Progress Notes (Signed)
[place] OFFICE PROGRESS NOTE   Diagnosis: Rectal cancer  INTERVAL HISTORY:   Neil. Mceachin returns as scheduled.  He pleated another cycle of FOLFIRI/bevacizumab on 10/15/2022.  A few episodes of diarrhea and nausea following chemotherapy.  His chief complaint is malaise.  He continues to have hemorrhoid bleeding.  He saw Dr. Dema Severin 10/22/2022.  He was found to have internal hemorrhoids.  Dr. Dema Severin recommends a trial of FiberCon.  Neil. Parkerson has not started the FiberCon.  Objective:  Vital signs in last 24 hours:  Blood pressure (!) 140/90, pulse 60, temperature 98.1 F (36.7 C), temperature source Oral, resp. rate 20, height '5\' 9"'$  (1.753 m), weight 144 lb (65.3 kg), SpO2 100 %.    HEENT: No thrush or ulcers Resp: Lungs clear bilaterally Cardio: Regular rate and rhythm GI: No hepatosplenomegaly, nontender Vascular: No leg edema    Portacath/PICC-without erythema  Lab Results:  Lab Results  Component Value Date   WBC 4.4 10/15/2022   HGB 13.0 10/15/2022   HCT 38.4 (L) 10/15/2022   MCV 81.5 10/15/2022   PLT 279 10/15/2022   NEUTROABS 3.2 10/15/2022    CMP  Lab Results  Component Value Date   NA 139 10/15/2022   K 3.7 10/15/2022   CL 106 10/15/2022   CO2 26 10/15/2022   GLUCOSE 102 (H) 10/15/2022   BUN 14 10/15/2022   CREATININE 0.94 10/15/2022   CALCIUM 9.4 10/15/2022   PROT 6.3 (L) 10/15/2022   ALBUMIN 4.0 10/15/2022   AST 14 (L) 10/15/2022   ALT 14 10/15/2022   ALKPHOS 55 10/15/2022   BILITOT 0.6 10/15/2022   GFRNONAA >60 10/15/2022    Lab Results  Component Value Date   CEA 107.34 (H) 10/01/2022    Medications: I have reviewed the patient's current medications.   Assessment/Plan:  Rectal cancer MRI abdomen 04/26/2019 2-3 new hypervascular masses in the liver dome, no abdominal lymphadenopathy, stable benign left adrenal adenoma, stable right lower pole kidney mass Ultrasound-guided biopsy of a liver lesion 05/07/2021-metastatic  adenocarcinoma with extensive necrosis, immunohistochemical profile consistent with a colorectal primary; foundation 1-microsatellite stable, tumor mutation burden 5, K-rasG 12V, NRAS wildtype Colonoscopy 05/22/2021-ulcerated partially obstructing mass at 15 cm from anal verge CTs 05/30/2021-numerous small pulmonary nodules concerning for metastases, thickening of the rectum, multiple liver metastases Cycle 1 FOLFOX 06/12/2021 Cycle 2 FOLFOX 06/26/2021 Cycle 3 FOLFOX 07/10/2021, oxaliplatin dose reduced due to progressive decline in the [place] and platelet count Cycle 4 FOLFOX 07/24/2021 Cycle 5 FOLFOX 08/07/2021, 5-FU bolus eliminated and oxaliplatin dose reduced, insurance would not approve Udenyca CTs 08/15/2021-decrease size of lung nodules, decreased hepatic metastases, persistent anorectal wall thickening, stable right renal mass Cycle 6 FOLFOX 08/21/2021, 5-FU bolus and 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 Cycle 7 FOLFOX 09/04/2021, 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 Cycle 8 FOLFOX 09/18/2021, 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 Cycle 9 FOLFOX 10/08/2021, 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 Cycle 10 FOLFOX 10/23/2021, 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 CTs 11/01/2021-decrease in size of occasional small pulmonary nodules, continued decrease in size of multiple hypoenhancing hepatic metastases, unchanged posttreatment appearance of the low rectum, unchanged exophytic mass of the inferior pole of the right kidney measuring 1.5 x 1.3 cm Cycle 11 5-fluorouracil 11/06/2021 Cycle 12 5-fluorouracil 11/20/2021 Cycle 13 5-fluorouracil 12/04/2021 Maintenance Xeloda beginning 12/19/2021 Xeloda dose reduced to 1000 mg twice daily beginning 12/27/2021 Xeloda placed on hold 01/16/2022 CTs 01/22/2022-segment of distal ileum with circumferential wall thickening and perienteric inflammation, decrease in size  of liver metastases, stable tiny  bilateral pulmonary nodules, circumferential wall thickening in the rectum, stable lower pole right kidney lesion suspicious for renal cell carcinoma Xeloda discontinued due to diarrhea 5-fluorouracil pump 02/05/2022 5-fluorouracil pump 02/26/2022 Treatment held 03/18/2022 due to diarrhea 5-fluorouracil pump 03/25/2022, dose reduced due to diarrhea 5-fluorouracil pump 04/16/2022 CTs 05/03/2022-unchanged tiny pulmonary nodules, unchanged liver metastases, stable appearance of the low rectal wall thickening, unchanged posterior right renal mass 5-fluorouracil pump 05/07/2022 5-fluorouracil pump 05/28/2022 5-fluorouracil pump 06/18/2022 CTs 07/04/2022-increased size of liver metastases, no new lesions, new 3 mm right lower lobe nodule-nonspecific Cycle 1 FOLFIRI/bevacizumab 07/16/2022 Cycle 2 FOLFIRI/bevacizumab 08/05/2022; 5-FU bolus eliminated, irinotecan dose reduced due to diarrhea and weight loss Cycle 3 FOLFIRI/bevacizumab 08/21/2022 Cycle 4 FOLFIRI/bevacizumab 09/03/2022 Cycle 5 FOLFIRI/bevacizumab 09/17/2022 CTs 09/26/2022-stable rectal thickening, stable hepatic metastases (dominant lesion appears smaller by my review), small indeterminate pulmonary nodules, unchanged exophytic enhancing lesion at the lower pole of the right kidney Cycle 6 FOLFIRI/bevacizumab 10/01/2022 Cycle 7 FOLFIRI/bevacizumab 10/15/2022, irinotecan dose reduced due to diarrhea Prostate cancer-simple prostatectomy January 2019, Gleason 3+3, 10 to 12% of specimen Active surveillance, biopsy May 2019 with small focus of Gleason 3+3 disease in 1/6 cores, surveillance continued Elevated PSA 04/20/2020 Biopsy 06/19/2020 8/12 cores positive, Gleason 4+4 PET scan 07/07/2020-negative CT 07/07/2020 right iliac and retrocaval adenopathy, 1.3 cm subcapsular right lower pole renal lesion Androgen deprivation therapy beginning 08/21/2020 Radiation to prostate, seminal vesicles, and pelvic lymph nodes to 10/22 - 12/28/2020 He continues every  19-monthFirmagon and daily Xtandi 08/21/2022 patient reports Xtandi discontinued   3.  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  Mitral valve prolapse 5.  Family history of prostate cancer 6.  08/15/2021 with leg weakness and an episode of slurred speech-TIA?,  Brain imaging negative 7.  Diarrhea and fatigue while on capecitabine April 2023-capecitabine discontinued 01/18/2022     Disposition: Neil RDaousthas metastatic rectal cancer.  He has been maintained on treatment with FOLFIRI/bevacizumab October 2023.  The CEA is lower today.  There is no clinical evidence of disease progression.  I suspect the malaise is related to chemo toxicity from chemotherapy.  He would like to delay treatment for 1 week and then changed to an every 3-week treatment schedule.  He will return for an office visit and chemotherapy in 1 week.  GBetsy Coder MD  10/29/2022  9:43 AM

## 2022-10-30 ENCOUNTER — Inpatient Hospital Stay: Payer: Medicare Other | Admitting: Licensed Clinical Social Worker

## 2022-10-30 NOTE — Progress Notes (Signed)
[place] CSW Progress Note  Clinical Education officer, museum contacted caregiver by phone to assess needs.  Patient's wife, Ubaldo Glassing, stated patient was not available.  She stated they already had advance directives.  Informed her that he could bring in a copy and it could be scanned into his chart.  Provided information about the [employer] and [employer].  She expressed interest and CSW will mail a packet to her.  She inquired about Hospice and CSW provided education.  She expressed no other needs.     Rodman Pickle Tanner Vigna, LCSW

## 2022-10-31 ENCOUNTER — Inpatient Hospital Stay: Payer: Medicare Other

## 2022-10-31 ENCOUNTER — Telehealth: Payer: Self-pay | Admitting: *Deleted

## 2022-10-31 ENCOUNTER — Other Ambulatory Visit: Payer: Self-pay | Admitting: Nurse Practitioner

## 2022-10-31 ENCOUNTER — Other Ambulatory Visit: Payer: Self-pay | Admitting: *Deleted

## 2022-10-31 DIAGNOSIS — C20 Malignant neoplasm of rectum: Secondary | ICD-10-CM

## 2022-10-31 MED ORDER — DIPHENOXYLATE-ATROPINE 2.5-0.025 MG PO TABS: ORAL_TABLET | ORAL | 0 refills | Status: DC

## 2022-10-31 NOTE — Telephone Encounter (Signed)
Neil Perry was sleeping, so this RN spoke w/his wife. Informed her that urine protein was very high and MD needs to have him collect his urine for a 24 hour protein check. Needs to start tomorrow and turn in to lab on 1st floor Saturday. Enter via ER for the return. Need results by 2/13 treatment appointment. They will come early tomorrow to pick up jug and directions.

## 2022-11-01 ENCOUNTER — Inpatient Hospital Stay: Payer: Medicare Other

## 2022-11-01 ENCOUNTER — Encounter: Payer: Self-pay | Admitting: Oncology

## 2022-11-03 DIAGNOSIS — Z5111 Encounter for antineoplastic chemotherapy: Secondary | ICD-10-CM | POA: Diagnosis not present

## 2022-11-04 ENCOUNTER — Other Ambulatory Visit (HOSPITAL_BASED_OUTPATIENT_CLINIC_OR_DEPARTMENT_OTHER): Payer: Self-pay

## 2022-11-04 DIAGNOSIS — C20 Malignant neoplasm of rectum: Secondary | ICD-10-CM

## 2022-11-04 LAB — PROTEIN, URINE, 24 HOUR
Collection Interval-UPROT: 24 hours
Protein, 24H Urine: 111 mg/d — ABNORMAL HIGH (ref 50–100)
Protein, Urine: 13 mg/dL
Urine Total Volume-UPROT: 850 mL

## 2022-11-05 ENCOUNTER — Inpatient Hospital Stay (HOSPITAL_BASED_OUTPATIENT_CLINIC_OR_DEPARTMENT_OTHER): Payer: Medicare Other | Admitting: Nurse Practitioner

## 2022-11-05 ENCOUNTER — Inpatient Hospital Stay: Payer: Medicare Other

## 2022-11-05 ENCOUNTER — Encounter: Payer: Self-pay | Admitting: Nurse Practitioner

## 2022-11-05 VITALS — BP 144/88 | HR 74 | Temp 98.1°F | Resp 18 | Ht 69.0 in | Wt 145.0 lb

## 2022-11-05 DIAGNOSIS — C20 Malignant neoplasm of rectum: Secondary | ICD-10-CM

## 2022-11-05 DIAGNOSIS — Z5111 Encounter for antineoplastic chemotherapy: Secondary | ICD-10-CM | POA: Diagnosis not present

## 2022-11-05 LAB — CBC WITH DIFFERENTIAL (CANCER CENTER ONLY)
Abs Immature Granulocytes: 0.01 10*3/uL (ref 0.00–0.07)
Basophils Absolute: 0 10*3/uL (ref 0.0–0.1)
Basophils Relative: 1 %
Eosinophils Absolute: 0.1 10*3/uL (ref 0.0–0.5)
Eosinophils Relative: 3 %
HCT: 38.7 % — ABNORMAL LOW (ref 39.0–52.0)
Hemoglobin: 13.1 g/dL (ref 13.0–17.0)
Immature Granulocytes: 0 %
Lymphocytes Relative: 17 %
Lymphs Abs: 0.7 10*3/uL (ref 0.7–4.0)
MCH: 27.6 pg (ref 26.0–34.0)
MCHC: 33.9 g/dL (ref 30.0–36.0)
MCV: 81.5 fL (ref 80.0–100.0)
Monocytes Absolute: 0.4 10*3/uL (ref 0.1–1.0)
Monocytes Relative: 10 %
Neutro Abs: 2.9 10*3/uL (ref 1.7–7.7)
Neutrophils Relative %: 69 %
Platelet Count: 324 10*3/uL (ref 150–400)
RBC: 4.75 MIL/uL (ref 4.22–5.81)
RDW: 16.9 % — ABNORMAL HIGH (ref 11.5–15.5)
WBC Count: 4.2 10*3/uL (ref 4.0–10.5)
nRBC: 0 % (ref 0.0–0.2)

## 2022-11-05 LAB — CMP (CANCER CENTER ONLY)
ALT: 12 U/L (ref 0–44)
AST: 12 U/L — ABNORMAL LOW (ref 15–41)
Albumin: 4 g/dL (ref 3.5–5.0)
Alkaline Phosphatase: 60 U/L (ref 38–126)
Anion gap: 7 (ref 5–15)
BUN: 10 mg/dL (ref 8–23)
CO2: 26 mmol/L (ref 22–32)
Calcium: 9.3 mg/dL (ref 8.9–10.3)
Chloride: 109 mmol/L (ref 98–111)
Creatinine: 0.81 mg/dL (ref 0.61–1.24)
GFR, Estimated: >60 mL/min (ref >60)
Glucose, Bld: 101 mg/dL — ABNORMAL HIGH (ref 70–99)
Potassium: 3.7 mmol/L (ref 3.5–5.1)
Sodium: 142 mmol/L (ref 135–145)
Total Bilirubin: 0.7 mg/dL (ref 0.3–1.2)
Total Protein: 6.2 g/dL — ABNORMAL LOW (ref 6.5–8.1)

## 2022-11-05 LAB — TOTAL PROTEIN, URINE DIPSTICK: Protein, ur: NEGATIVE mg/dL

## 2022-11-05 MED ORDER — SODIUM CHLORIDE 0.9 % IV SOLN
100.0000 mg/m2 | Freq: Once | INTRAVENOUS | Status: AC
  Administered 2022-11-05: 180 mg via INTRAVENOUS
  Filled 2022-11-05: qty 5

## 2022-11-05 MED ORDER — SODIUM CHLORIDE 0.9 % IV SOLN
7.5000 mg/kg | Freq: Once | INTRAVENOUS | Status: AC
  Administered 2022-11-05: 500 mg via INTRAVENOUS
  Filled 2022-11-05: qty 16

## 2022-11-05 MED ORDER — SODIUM CHLORIDE 0.9 % IV SOLN
10.0000 mg | Freq: Once | INTRAVENOUS | Status: AC
  Administered 2022-11-05: 10 mg via INTRAVENOUS
  Filled 2022-11-05: qty 10

## 2022-11-05 MED ORDER — ATROPINE SULFATE 1 MG/ML IV SOLN
0.5000 mg | Freq: Once | INTRAVENOUS | Status: AC | PRN
  Administered 2022-11-05: 0.5 mg via INTRAVENOUS
  Filled 2022-11-05: qty 1

## 2022-11-05 MED ORDER — SODIUM CHLORIDE 0.9 % IV SOLN
400.0000 mg/m2 | Freq: Once | INTRAVENOUS | Status: AC
  Administered 2022-11-05: 712 mg via INTRAVENOUS
  Filled 2022-11-05: qty 35.6

## 2022-11-05 MED ORDER — PALONOSETRON HCL INJECTION 0.25 MG/5ML
0.2500 mg | Freq: Once | INTRAVENOUS | Status: AC
  Administered 2022-11-05: 0.25 mg via INTRAVENOUS
  Filled 2022-11-05: qty 5

## 2022-11-05 MED ORDER — SODIUM CHLORIDE 0.9 % IV SOLN
1600.0000 mg/m2 | INTRAVENOUS | Status: DC
  Administered 2022-11-05: 2850 mg via INTRAVENOUS
  Filled 2022-11-05: qty 57

## 2022-11-05 MED ORDER — SODIUM CHLORIDE 0.9 % IV SOLN: Freq: Once | INTRAVENOUS | Status: AC

## 2022-11-05 MED ORDER — SODIUM CHLORIDE 0.9 % IV SOLN: 5.0000 mg/kg | Freq: Once | INTRAVENOUS | Status: DC

## 2022-11-05 NOTE — Progress Notes (Signed)
[place] OFFICE PROGRESS NOTE   Diagnosis: Rectal cancer  INTERVAL HISTORY:   Mr. Donini returns as scheduled.  Treatment was held last week due to malaise.  Urine protein was markedly elevated on 10/29/2022.  He completed a 24-hour urine.  Energy and appetite have improved.  He denies nausea/vomiting.  No mouth sores.  No diarrhea.  He recently began [employer].  He has discontinued due to constipation.  Objective:  Vital signs in last 24 hours:  Blood pressure (!) 144/88, pulse 74, temperature 98.1 F (36.7 C), temperature source Oral, resp. rate 18, height 5' 9"$  (1.753 m), weight 145 lb (65.8 kg), SpO2 100 %.    HEENT: No thrush or ulcers. Resp: Lungs clear bilaterally. Cardio: Regular rate and rhythm. GI: Abdomen soft and nontender.  No hepatosplenomegaly. Vascular: No leg edema. Neuro: Alert and oriented. Skin: Palms without erythema. Port-A-Cath without erythema.   Lab Results:  Lab Results  Component Value Date   WBC 4.2 11/05/2022   HGB 13.1 11/05/2022   HCT 38.7 (L) 11/05/2022   MCV 81.5 11/05/2022   PLT 324 11/05/2022   NEUTROABS 2.9 11/05/2022    Imaging:  No results found.  Medications: I have reviewed the patient's current medications.  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; foundation 1-microsatellite stable, tumor mutation burden 5, K-rasG 12V, NRAS wildtype Colonoscopy 05/22/2021-ulcerated partially obstructing mass at 15 cm from anal verge CTs 05/30/2021-numerous small pulmonary nodules concerning for metastases, thickening of the rectum, multiple liver metastases Cycle 1 FOLFOX 06/12/2021 Cycle 2 FOLFOX  06/26/2021 Cycle 3 FOLFOX 07/10/2021, oxaliplatin dose reduced due to progressive decline in the [place] and platelet count Cycle 4 FOLFOX 07/24/2021 Cycle 5 FOLFOX 08/07/2021, 5-FU bolus eliminated and oxaliplatin dose reduced, insurance would not approve Udenyca CTs 08/15/2021-decrease size of lung nodules, decreased hepatic metastases, persistent anorectal wall thickening, stable right renal mass Cycle 6 FOLFOX 08/21/2021, 5-FU bolus and 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 Cycle 7 FOLFOX 09/04/2021, 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 Cycle 8 FOLFOX 09/18/2021, 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 Cycle 9 FOLFOX 10/08/2021, 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 Cycle 10 FOLFOX 10/23/2021, 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 CTs 11/01/2021-decrease in size of occasional small pulmonary nodules, continued decrease in size of multiple hypoenhancing hepatic metastases, unchanged posttreatment appearance of the low rectum, unchanged exophytic mass of the inferior pole of the right kidney measuring 1.5 x 1.3 cm Cycle 11 5-fluorouracil 11/06/2021 Cycle 12 5-fluorouracil 11/20/2021 Cycle 13 5-fluorouracil 12/04/2021 Maintenance Xeloda beginning 12/19/2021 Xeloda dose reduced to 1000 mg twice daily beginning 12/27/2021 Xeloda placed on hold 01/16/2022 CTs 01/22/2022-segment of distal ileum with circumferential wall thickening and perienteric inflammation, decrease in size of liver metastases, stable tiny bilateral pulmonary nodules, circumferential wall thickening in the rectum, stable lower pole right kidney lesion suspicious for renal cell carcinoma Xeloda discontinued due to diarrhea 5-fluorouracil pump 02/05/2022 5-fluorouracil pump 02/26/2022 Treatment held 03/18/2022 due to diarrhea 5-fluorouracil pump 03/25/2022, dose reduced due to diarrhea 5-fluorouracil pump 04/16/2022 CTs 05/03/2022-unchanged tiny pulmonary nodules, unchanged liver  metastases, stable appearance of the low rectal wall thickening, unchanged posterior right renal mass 5-fluorouracil pump 05/07/2022 5-fluorouracil pump 05/28/2022 5-fluorouracil pump 06/18/2022 CTs 07/04/2022-increased size of liver metastases, no new lesions, new 3 mm right lower lobe  nodule-nonspecific Cycle 1 FOLFIRI/bevacizumab 07/16/2022 Cycle 2 FOLFIRI/bevacizumab 08/05/2022; 5-FU bolus eliminated, irinotecan dose reduced due to diarrhea and weight loss Cycle 3 FOLFIRI/bevacizumab 08/21/2022 Cycle 4 FOLFIRI/bevacizumab 09/03/2022 Cycle 5 FOLFIRI/bevacizumab 09/17/2022 CTs 09/26/2022-stable rectal thickening, stable hepatic metastases (dominant lesion appears smaller by my review), small indeterminate pulmonary nodules, unchanged exophytic enhancing lesion at the lower pole of the right kidney Cycle 6 FOLFIRI/bevacizumab 10/01/2022 Cycle 7 FOLFIRI/bevacizumab 10/15/2022, irinotecan dose reduced due to diarrhea Cycle 8 FOLFIRI/bevacizumab 11/05/2022 Prostate cancer-simple prostatectomy January 2019, Gleason 3+3, 10 to 12% of specimen Active surveillance, biopsy May 2019 with small focus of Gleason 3+3 disease in 1/6 cores, surveillance continued Elevated PSA 04/20/2020 Biopsy 06/19/2020 8/12 cores positive, Gleason 4+4 PET scan 07/07/2020-negative CT 07/07/2020 right iliac and retrocaval adenopathy, 1.3 cm subcapsular right lower pole renal lesion Androgen deprivation therapy beginning 08/21/2020 Radiation to prostate, seminal vesicles, and pelvic lymph nodes to 10/22 - 12/28/2020 He continues every 78-monthFirmagon and daily Xtandi 08/21/2022 patient reports Xtandi discontinued   3.  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  Mitral valve prolapse 5.  Family history of prostate cancer 6.  08/15/2021 with leg weakness and an episode of slurred speech-TIA?,  Brain imaging negative 7.  Diarrhea and fatigue while on  capecitabine April 2023-capecitabine discontinued 01/18/2022    Disposition: Mr. RMonnappears stable.  He has completed 7 cycles of FOLFIRI/bevacizumab.  There is no clinical evidence of disease progression.  Performance status is better since treatment was held last week.  Plan to proceed with cycle 8 FOLFIRI/bevacizumab today as scheduled.  Treatment interval adjusted to every 3 weeks.  CBC and chemistry panel reviewed.  Labs adequate to proceed as above.  Results of recent 24-hour urine protein reviewed.  Urine protein excretion is very minimally elevated.  Urine dipstick today is negative for protein.  He will return for lab, follow-up, treatment in 3 weeks.  He will contact the office in the interim with any problems.    LNed CardANP/GNP-BC   11/05/2022  10:19 AM

## 2022-11-05 NOTE — Progress Notes (Signed)
Patient seen by Lisa Thomas NP today  Vitals are within treatment parameters.  Labs reviewed by Lisa Thomas NP and are within treatment parameters.  Per physician team, patient is ready for treatment and there are NO modifications to the treatment plan.     

## 2022-11-05 NOTE — Patient Instructions (Signed)
[place]   Discharge Instructions: Thank you for choosing [facility] to provide your oncology and hematology care.   If you have a lab appointment with the [facility], please go directly to the [facility] and check in at the registration area.   Wear comfortable clothing and clothing appropriate for easy access to any Portacath or PICC line.   We strive to give you quality time with your provider. You may need to reschedule your appointment if you arrive late (15 or more minutes).  Arriving late affects you and other patients whose appointments are after yours.  Also, if you miss three or more appointments without notifying the office, you may be dismissed from the clinic at the provider's discretion.      For prescription refill requests, have your pharmacy contact our office and allow 72 hours for refills to be completed.    Today you received the following chemotherapy and/or immunotherapy agents Bevacizumab-adcd (VEGZELMA), Irinotecan (CAMPTOSAR), Leucovorin & Flourouracil (ADRUCIL).      To help prevent nausea and vomiting after your treatment, we encourage you to take your nausea medication as directed.  BELOW ARE SYMPTOMS THAT SHOULD BE REPORTED IMMEDIATELY: *FEVER GREATER THAN 100.4 F (38 C) OR HIGHER *CHILLS OR SWEATING *NAUSEA AND VOMITING THAT IS NOT CONTROLLED WITH YOUR NAUSEA MEDICATION *UNUSUAL SHORTNESS OF BREATH *UNUSUAL BRUISING OR BLEEDING *URINARY PROBLEMS (pain or burning when urinating, or frequent urination) *BOWEL PROBLEMS (unusual diarrhea, constipation, pain near the anus) TENDERNESS IN MOUTH AND THROAT WITH OR WITHOUT PRESENCE OF ULCERS (sore throat, sores in mouth, or a toothache) UNUSUAL RASH, SWELLING OR PAIN  UNUSUAL VAGINAL DISCHARGE OR ITCHING   Items with * indicate a potential emergency and should be followed up as soon as possible or go to the Emergency Department if any problems should  occur.  Please show the CHEMOTHERAPY ALERT CARD or IMMUNOTHERAPY ALERT CARD at check-in to the Emergency Department and triage nurse.  Should you have questions after your visit or need to cancel or reschedule your appointment, please contact [facility]  Dept: [phone]  and follow the prompts.  Office hours are 8:00 a.m. to 4:30 p.m. Monday - Friday. Please note that voicemails left after 4:00 p.m. may not be returned until the following business day.  We are closed weekends and major holidays. You have access to a nurse at all times for urgent questions. Please call the main number to the clinic Dept: [phone] and follow the prompts.   For any non-urgent questions, you may also contact your provider using MyChart. We now offer e-Visits for anyone 17 and older to request care online for non-urgent symptoms. For details visit [email or web address].   Also download the MyChart app! Go to the app store, search "MyChart", open the app, select [facility], and log in with your MyChart username and password.  Bevacizumab Injection What is this medication? BEVACIZUMAB (be va SIZ yoo mab) treats some types of cancer. It works by blocking a protein that causes cancer cells to grow and multiply. This helps to slow or stop the spread of cancer cells. It is a monoclonal antibody. This medicine may be used for other purposes; ask your health care provider or pharmacist if you have questions. COMMON BRAND NAME(S): Alymsys, Avastin, MVASI, Noah Charon What should I tell my care team before I take this medication? They need to know if you have any of these conditions: Blood clots Coughing up  blood Having or recent surgery Heart failure High blood pressure History of a connection between 2 or more body parts that do not usually connect (fistula) History of a tear in your stomach or intestines Protein in your urine An unusual or allergic reaction to bevacizumab,  other medications, foods, dyes, or preservatives Pregnant or trying to get pregnant Breast-feeding How should I use this medication? This medication is injected into a vein. It is given by your care team in a hospital or clinic setting. Talk to your care team the use of this medication in children. Special care may be needed. Overdosage: If you think you have taken too much of this medicine contact a poison control center or emergency room at once. NOTE: This medicine is only for you. Do not share this medicine with others. What if I miss a dose? Keep appointments for follow-up doses. It is important not to miss your dose. Call your care team if you are unable to keep an appointment. What may interact with this medication? Interactions are not expected. This list may not describe all possible interactions. Give your health care provider a list of all the medicines, herbs, non-prescription drugs, or dietary supplements you use. Also tell them if you smoke, drink alcohol, or use illegal drugs. Some items may interact with your medicine. What should I watch for while using this medication? Your condition will be monitored carefully while you are receiving this medication. You may need blood work while taking this medication. This medication may make you feel generally unwell. This is not uncommon as chemotherapy can affect healthy cells as well as cancer cells. Report any side effects. Continue your course of treatment even though you feel ill unless your care team tells you to stop. This medication may increase your risk to bruise or bleed. Call your care team if you notice any unusual bleeding. Before having surgery, talk to your care team to make sure it is ok. This medication can increase the risk of poor healing of your surgical site or wound. You will need to stop this medication for 28 days before surgery. After surgery, wait at least 28 days before restarting this medication. Make sure the  surgical site or wound is healed enough before restarting this medication. Talk to your care team if questions. Talk to your care team if you may be pregnant. Serious birth defects can occur if you take this medication during pregnancy and for 6 months after the last dose. Contraception is recommended while taking this medication and for 6 months after the last dose. Your care team can help you find the option that works for you. Do not breastfeed while taking this medication and for 6 months after the last dose. This medication can cause infertility. Talk to your care team if you are concerned about your fertility. What side effects may I notice from receiving this medication? Side effects that you should report to your care team as soon as possible: Allergic reactions--skin rash, itching, hives, swelling of the face, lips, tongue, or throat Bleeding--bloody or black, tar-like stools, vomiting blood or brown material that looks like coffee grounds, red or dark brown urine, small red or purple spots on skin, unusual bruising or bleeding Blood clot--pain, swelling, or warmth in the leg, shortness of breath, chest pain Heart attack--pain or tightness in the chest, shoulders, arms, or jaw, nausea, shortness of breath, cold or clammy skin, feeling faint or lightheaded Heart failure--shortness of breath, swelling of the ankles, feet, or hands,  sudden weight gain, unusual weakness or fatigue Increase in blood pressure Infection--fever, chills, cough, sore throat, wounds that don't heal, pain or trouble when passing urine, general feeling of discomfort or being unwell Infusion reactions--chest pain, shortness of breath or trouble breathing, feeling faint or lightheaded Kidney injury--decrease in the amount of urine, swelling of the ankles, hands, or feet Stomach pain that is severe, does not go away, or gets worse Stroke--sudden numbness or weakness of the face, arm, or leg, trouble speaking, confusion,  trouble walking, loss of balance or coordination, dizziness, severe headache, change in vision Sudden and severe headache, confusion, change in vision, seizures, which may be signs of posterior reversible encephalopathy syndrome (PRES) Side effects that usually do not require medical attention (report to your care team if they continue or are bothersome): Back pain Change in taste Diarrhea Dry skin Increased tears Nosebleed This list may not describe all possible side effects. Call your doctor for medical advice about side effects. You may report side effects to FDA at 1-800-FDA-1088. Where should I keep my medication? This medication is given in a hospital or clinic. It will not be stored at home. NOTE: This sheet is a summary. It may not cover all possible information. If you have questions about this medicine, talk to your doctor, pharmacist, or health care provider.  2023 Elsevier/Gold Standard (2022-01-11 00:00:00)  Irinotecan Injection What is this medication? IRINOTECAN (ir in oh TEE kan) treats some types of cancer. It works by slowing down the growth of cancer cells. This medicine may be used for other purposes; ask your health care provider or pharmacist if you have questions. COMMON BRAND NAME(S): Camptosar What should I tell my care team before I take this medication? They need to know if you have any of these conditions: Dehydration Diarrhea Infection, especially a viral infection, such as chickenpox, cold sores, herpes Liver disease Low blood cell levels (white cells, red cells, and platelets) Low levels of electrolytes, such as calcium, magnesium, or potassium in your blood Recent or ongoing radiation An unusual or allergic reaction to irinotecan, other medications, foods, dyes, or preservatives If you or your partner are pregnant or trying to get pregnant Breast-feeding How should I use this medication? This medication is injected into a vein. It is given by your  care team in a hospital or clinic setting. Talk to your care team about the use of this medication in children. Special care may be needed. Overdosage: If you think you have taken too much of this medicine contact a poison control center or emergency room at once. NOTE: This medicine is only for you. Do not share this medicine with others. What if I miss a dose? Keep appointments for follow-up doses. It is important not to miss your dose. Call your care team if you are unable to keep an appointment. What may interact with this medication? Do not take this medication with any of the following: Cobicistat Itraconazole This medication may also interact with the following: Certain antibiotics, such as clarithromycin, rifampin, rifabutin Certain antivirals for HIV or AIDS Certain medications for fungal infections, such as ketoconazole, posaconazole, voriconazole Certain medications for seizures, such as carbamazepine, phenobarbital, phenytoin Gemfibrozil Nefazodone St. John's wort This list may not describe all possible interactions. Give your health care provider a list of all the medicines, herbs, non-prescription drugs, or dietary supplements you use. Also tell them if you smoke, drink alcohol, or use illegal drugs. Some items may interact with your medicine. What should I watch  for while using this medication? Your condition will be monitored carefully while you are receiving this medication. You may need blood work while taking this medication. This medication may make you feel generally unwell. This is not uncommon as chemotherapy can affect healthy cells as well as cancer cells. Report any side effects. Continue your course of treatment even though you feel ill unless your care team tells you to stop. This medication can cause serious side effects. To reduce the risk, your care team may give you other medications to take before receiving this one. Be sure to follow the directions from your  care team. This medication may affect your coordination, reaction time, or judgement. Do not drive or operate machinery until you know how this medication affects you. Sit up or stand slowly to reduce the risk of dizzy or fainting spells. Drinking alcohol with this medication can increase the risk of these side effects. This medication may increase your risk of getting an infection. Call your care team for advice if you get a fever, chills, sore throat, or other symptoms of a cold or flu. Do not treat yourself. Try to avoid being around people who are sick. Avoid taking medications that contain aspirin, acetaminophen, ibuprofen, naproxen, or ketoprofen unless instructed by your care team. These medications may hide a fever. This medication may increase your risk to bruise or bleed. Call your care team if you notice any unusual bleeding. Be careful brushing or flossing your teeth or using a toothpick because you may get an infection or bleed more easily. If you have any dental work done, tell your dentist you are receiving this medication. Talk to your care team if you or your partner are pregnant or think either of you might be pregnant. This medication can cause serious birth defects if taken during pregnancy and for 6 months after the last dose. You will need a negative pregnancy test before starting this medication. Contraception is recommended while taking this medication and for 6 months after the last dose. Your care team can help you find the option that works for you. Do not father a child while taking this medication and for 3 months after the last dose. Use a condom for contraception during this time period. Do not breastfeed while taking this medication and for 7 days after the last dose. This medication may cause infertility. Talk to your care team if you are concerned about your fertility. What side effects may I notice from receiving this medication? Side effects that you should report to  your care team as soon as possible: Allergic reactions--skin rash, itching, hives, swelling of the face, lips, tongue, or throat Dry cough, shortness of breath or trouble breathing Increased saliva or tears, increased sweating, stomach cramping, diarrhea, small pupils, unusual weakness or fatigue, slow heartbeat Infection--fever, chills, cough, sore throat, wounds that don't heal, pain or trouble when passing urine, general feeling of discomfort or being unwell Kidney injury--decrease in the amount of urine, swelling of the ankles, hands, or feet Low red blood cell level--unusual weakness or fatigue, dizziness, headache, trouble breathing Severe or prolonged diarrhea Unusual bruising or bleeding Side effects that usually do not require medical attention (report to your care team if they continue or are bothersome): Constipation Diarrhea Hair loss Loss of appetite Nausea Stomach pain This list may not describe all possible side effects. Call your doctor for medical advice about side effects. You may report side effects to FDA at 1-800-FDA-1088. Where should I keep my medication?  This medication is given in a hospital or clinic. It will not be stored at home. NOTE: This sheet is a summary. It may not cover all possible information. If you have questions about this medicine, talk to your doctor, pharmacist, or health care provider.  2023 Elsevier/Gold Standard (2022-01-17 00:00:00)  Leucovorin Injection What is this medication? LEUCOVORIN (loo koe VOR in) prevents side effects from certain medications, such as methotrexate. It works by increasing folate levels. This helps protect healthy cells in your body. It may also be used to treat anemia caused by low levels of folate. It can also be used with fluorouracil, a type of chemotherapy, to treat colorectal cancer. It works by increasing the effects of fluorouracil in the body. This medicine may be used for other purposes; ask your health care  provider or pharmacist if you have questions. What should I tell my care team before I take this medication? They need to know if you have any of these conditions: Anemia from low levels of vitamin B12 in the blood An unusual or allergic reaction to leucovorin, folic acid, other medications, foods, dyes, or preservatives Pregnant or trying to get pregnant Breastfeeding How should I use this medication? This medication is injected into a vein or a muscle. It is given by your care team in a hospital or clinic setting. Talk to your care team about the use of this medication in children. Special care may be needed. Overdosage: If you think you have taken too much of this medicine contact a poison control center or emergency room at once. NOTE: This medicine is only for you. Do not share this medicine with others. What if I miss a dose? Keep appointments for follow-up doses. It is important not to miss your dose. Call your care team if you are unable to keep an appointment. What may interact with this medication? Capecitabine Fluorouracil Phenobarbital Phenytoin Primidone Trimethoprim;sulfamethoxazole This list may not describe all possible interactions. Give your health care provider a list of all the medicines, herbs, non-prescription drugs, or dietary supplements you use. Also tell them if you smoke, drink alcohol, or use illegal drugs. Some items may interact with your medicine. What should I watch for while using this medication? Your condition will be monitored carefully while you are receiving this medication. This medication may increase the side effects of 5-fluorouracil. Tell your care team if you have diarrhea or mouth sores that do not get better or that get worse. What side effects may I notice from receiving this medication? Side effects that you should report to your care team as soon as possible: Allergic reactions--skin rash, itching, hives, swelling of the face, lips, tongue,  or throat This list may not describe all possible side effects. Call your doctor for medical advice about side effects. You may report side effects to FDA at 1-800-FDA-1088. Where should I keep my medication? This medication is given in a hospital or clinic. It will not be stored at home. NOTE: This sheet is a summary. It may not cover all possible information. If you have questions about this medicine, talk to your doctor, pharmacist, or health care provider.  2023 Elsevier/Gold Standard (2022-02-12 00:00:00)  Fluorouracil Injection What is this medication? FLUOROURACIL (flure oh YOOR a sil) treats some types of cancer. It works by slowing down the growth of cancer cells. This medicine may be used for other purposes; ask your health care provider or pharmacist if you have questions. COMMON BRAND NAME(S): Adrucil What should I tell  my care team before I take this medication? They need to know if you have any of these conditions: Blood disorders Dihydropyrimidine dehydrogenase (DPD) deficiency Infection, such as chickenpox, cold sores, herpes Kidney disease Liver disease Poor nutrition Recent or ongoing radiation therapy An unusual or allergic reaction to fluorouracil, other medications, foods, dyes, or preservatives If you or your partner are pregnant or trying to get pregnant Breast-feeding How should I use this medication? This medication is injected into a vein. It is administered by your care team in a hospital or clinic setting. Talk to your care team about the use of this medication in children. Special care may be needed. Overdosage: If you think you have taken too much of this medicine contact a poison control center or emergency room at once. NOTE: This medicine is only for you. Do not share this medicine with others. What if I miss a dose? Keep appointments for follow-up doses. It is important not to miss your dose. Call your care team if you are unable to keep an  appointment. What may interact with this medication? Do not take this medication with any of the following: Live virus vaccines This medication may also interact with the following: Medications that treat or prevent blood clots, such as warfarin, enoxaparin, dalteparin This list may not describe all possible interactions. Give your health care provider a list of all the medicines, herbs, non-prescription drugs, or dietary supplements you use. Also tell them if you smoke, drink alcohol, or use illegal drugs. Some items may interact with your medicine. What should I watch for while using this medication? Your condition will be monitored carefully while you are receiving this medication. This medication may make you feel generally unwell. This is not uncommon as chemotherapy can affect healthy cells as well as cancer cells. Report any side effects. Continue your course of treatment even though you feel ill unless your care team tells you to stop. In some cases, you may be given additional medications to help with side effects. Follow all directions for their use. This medication may increase your risk of getting an infection. Call your care team for advice if you get a fever, chills, sore throat, or other symptoms of a cold or flu. Do not treat yourself. Try to avoid being around people who are sick. This medication may increase your risk to bruise or bleed. Call your care team if you notice any unusual bleeding. Be careful brushing or flossing your teeth or using a toothpick because you may get an infection or bleed more easily. If you have any dental work done, tell your dentist you are receiving this medication. Avoid taking medications that contain aspirin, acetaminophen, ibuprofen, naproxen, or ketoprofen unless instructed by your care team. These medications may hide a fever. Do not treat diarrhea with over the counter products. Contact your care team if you have diarrhea that lasts more than 2  days or if it is severe and watery. This medication can make you more sensitive to the sun. Keep out of the sun. If you cannot avoid being in the sun, wear protective clothing and sunscreen. Do not use sun lamps, tanning beds, or tanning booths. Talk to your care team if you or your partner wish to become pregnant or think you might be pregnant. This medication can cause serious birth defects if taken during pregnancy and for 3 months after the last dose. A reliable form of contraception is recommended while taking this medication and for 3 months after  the last dose. Talk to your care team about effective forms of contraception. Do not father a child while taking this medication and for 3 months after the last dose. Use a condom while having sex during this time period. Do not breastfeed while taking this medication. This medication may cause infertility. Talk to your care team if you are concerned about your fertility. What side effects may I notice from receiving this medication? Side effects that you should report to your care team as soon as possible: Allergic reactions--skin rash, itching, hives, swelling of the face, lips, tongue, or throat Heart attack--pain or tightness in the chest, shoulders, arms, or jaw, nausea, shortness of breath, cold or clammy skin, feeling faint or lightheaded Heart failure--shortness of breath, swelling of the ankles, feet, or hands, sudden weight gain, unusual weakness or fatigue Heart rhythm changes--fast or irregular heartbeat, dizziness, feeling faint or lightheaded, chest pain, trouble breathing High ammonia level--unusual weakness or fatigue, confusion, loss of appetite, nausea, vomiting, seizures Infection--fever, chills, cough, sore throat, wounds that don't heal, pain or trouble when passing urine, general feeling of discomfort or being unwell Low red blood cell level--unusual weakness or fatigue, dizziness, headache, trouble breathing Pain, tingling, or  numbness in the hands or feet, muscle weakness, change in vision, confusion or trouble speaking, loss of balance or coordination, trouble walking, seizures Redness, swelling, and blistering of the skin over hands and feet Severe or prolonged diarrhea Unusual bruising or bleeding Side effects that usually do not require medical attention (report to your care team if they continue or are bothersome): Dry skin Headache Increased tears Nausea Pain, redness, or swelling with sores inside the mouth or throat Sensitivity to light Vomiting This list may not describe all possible side effects. Call your doctor for medical advice about side effects. You may report side effects to FDA at 1-800-FDA-1088. Where should I keep my medication? This medication is given in a hospital or clinic. It will not be stored at home. NOTE: This sheet is a summary. It may not cover all possible information. If you have questions about this medicine, talk to your doctor, pharmacist, or health care provider.  2023 [employer] (2022-01-08 00:00:00)  The chemotherapy medication bag should finish at 46 hours, 96 hours, or 7 days. For example, if your pump is scheduled for 46 hours and it was put on at 4:00 p.m., it should finish at 2:00 p.m. the day it is scheduled to come off regardless of your appointment time.     Estimated time to finish at 12:30 p.m. on Thursday 11/07/2022.   If the display on your pump reads "Low Volume" and it is beeping, take the batteries out of the pump and come to the cancer center for it to be taken off.   If the pump alarms go off prior to the pump reading "Low Volume" then call [phone] and someone can assist you.  If the plunger comes out and the chemotherapy medication is leaking out, please use your home chemo spill kit to clean up the spill. Do NOT use paper towels or other household products.  If you have problems or questions regarding your pump, please call either  [phone] (24 hours a day) or the cancer center Monday-Friday 8:00 a.m.- 4:30 p.m. at the clinic number and we will assist you. If you are unable to get assistance, then go to the nearest Emergency Department and ask the staff to contact the IV team for assistance.

## 2022-11-06 LAB — TESTOSTERONE: Testosterone: 292.5

## 2022-11-06 LAB — PSA: PSA: 0.016

## 2022-11-07 ENCOUNTER — Inpatient Hospital Stay: Payer: Medicare Other

## 2022-11-07 VITALS — BP 148/82 | HR 75 | Temp 98.4°F | Resp 20

## 2022-11-07 DIAGNOSIS — C20 Malignant neoplasm of rectum: Secondary | ICD-10-CM

## 2022-11-07 DIAGNOSIS — Z5111 Encounter for antineoplastic chemotherapy: Secondary | ICD-10-CM | POA: Diagnosis not present

## 2022-11-07 MED ORDER — HEPARIN SOD (PORK) LOCK FLUSH 100 UNIT/ML IV SOLN
500.0000 [IU] | Freq: Once | INTRAVENOUS | Status: AC | PRN
  Administered 2022-11-07: 500 [IU]

## 2022-11-07 MED ORDER — SODIUM CHLORIDE 0.9% FLUSH
10.0000 mL | INTRAVENOUS | Status: DC | PRN
  Administered 2022-11-07: 10 mL

## 2022-11-07 NOTE — Patient Instructions (Signed)

## 2022-11-12 ENCOUNTER — Inpatient Hospital Stay: Payer: Medicare Other

## 2022-11-12 ENCOUNTER — Inpatient Hospital Stay: Payer: Medicare Other | Admitting: Nurse Practitioner

## 2022-11-14 ENCOUNTER — Inpatient Hospital Stay: Payer: Medicare Other

## 2022-11-20 ENCOUNTER — Other Ambulatory Visit: Payer: Self-pay | Admitting: Oncology

## 2022-11-22 ENCOUNTER — Encounter: Payer: Self-pay | Admitting: Family Medicine

## 2022-11-26 ENCOUNTER — Encounter: Payer: Self-pay | Admitting: Nurse Practitioner

## 2022-11-26 ENCOUNTER — Inpatient Hospital Stay: Payer: Medicare Other

## 2022-11-26 ENCOUNTER — Inpatient Hospital Stay (HOSPITAL_BASED_OUTPATIENT_CLINIC_OR_DEPARTMENT_OTHER): Payer: Medicare Other | Admitting: Nurse Practitioner

## 2022-11-26 ENCOUNTER — Inpatient Hospital Stay: Payer: Medicare Other | Attending: Nurse Practitioner

## 2022-11-26 VITALS — BP 145/82 | HR 73 | Temp 98.1°F | Resp 18 | Ht 69.0 in | Wt 146.6 lb

## 2022-11-26 VITALS — BP 140/75 | HR 64 | Resp 18

## 2022-11-26 DIAGNOSIS — C20 Malignant neoplasm of rectum: Secondary | ICD-10-CM | POA: Insufficient documentation

## 2022-11-26 DIAGNOSIS — C787 Secondary malignant neoplasm of liver and intrahepatic bile duct: Secondary | ICD-10-CM | POA: Insufficient documentation

## 2022-11-26 DIAGNOSIS — R5383 Other fatigue: Secondary | ICD-10-CM | POA: Diagnosis not present

## 2022-11-26 DIAGNOSIS — Z5111 Encounter for antineoplastic chemotherapy: Secondary | ICD-10-CM | POA: Diagnosis not present

## 2022-11-26 DIAGNOSIS — I341 Nonrheumatic mitral (valve) prolapse: Secondary | ICD-10-CM | POA: Diagnosis not present

## 2022-11-26 DIAGNOSIS — Z8042 Family history of malignant neoplasm of prostate: Secondary | ICD-10-CM | POA: Insufficient documentation

## 2022-11-26 DIAGNOSIS — R630 Anorexia: Secondary | ICD-10-CM | POA: Insufficient documentation

## 2022-11-26 LAB — CMP (CANCER CENTER ONLY)
ALT: 13 U/L (ref 0–44)
AST: 12 U/L — ABNORMAL LOW (ref 15–41)
Albumin: 4 g/dL (ref 3.5–5.0)
Alkaline Phosphatase: 64 U/L (ref 38–126)
Anion gap: 6 (ref 5–15)
BUN: 13 mg/dL (ref 8–23)
CO2: 26 mmol/L (ref 22–32)
Calcium: 9.4 mg/dL (ref 8.9–10.3)
Chloride: 108 mmol/L (ref 98–111)
Creatinine: 0.84 mg/dL (ref 0.61–1.24)
GFR, Estimated: >60 mL/min (ref >60)
Glucose, Bld: 96 mg/dL (ref 70–99)
Potassium: 3.9 mmol/L (ref 3.5–5.1)
Sodium: 140 mmol/L (ref 135–145)
Total Bilirubin: 0.7 mg/dL (ref 0.3–1.2)
Total Protein: 6.3 g/dL — ABNORMAL LOW (ref 6.5–8.1)

## 2022-11-26 LAB — CBC WITH DIFFERENTIAL (CANCER CENTER ONLY)
Abs Immature Granulocytes: 0.01 10*3/uL (ref 0.00–0.07)
Basophils Absolute: 0 10*3/uL (ref 0.0–0.1)
Basophils Relative: 0 %
Eosinophils Absolute: 0.1 10*3/uL (ref 0.0–0.5)
Eosinophils Relative: 3 %
HCT: 40.1 % (ref 39.0–52.0)
Hemoglobin: 13.5 g/dL (ref 13.0–17.0)
Immature Granulocytes: 0 %
Lymphocytes Relative: 15 %
Lymphs Abs: 0.6 10*3/uL — ABNORMAL LOW (ref 0.7–4.0)
MCH: 27.9 pg (ref 26.0–34.0)
MCHC: 33.7 g/dL (ref 30.0–36.0)
MCV: 82.9 fL (ref 80.0–100.0)
Monocytes Absolute: 0.4 10*3/uL (ref 0.1–1.0)
Monocytes Relative: 10 %
Neutro Abs: 2.8 10*3/uL (ref 1.7–7.7)
Neutrophils Relative %: 72 %
Platelet Count: 248 10*3/uL (ref 150–400)
RBC: 4.84 MIL/uL (ref 4.22–5.81)
RDW: 17 % — ABNORMAL HIGH (ref 11.5–15.5)
WBC Count: 3.9 10*3/uL — ABNORMAL LOW (ref 4.0–10.5)
nRBC: 0 % (ref 0.0–0.2)

## 2022-11-26 LAB — TOTAL PROTEIN, URINE DIPSTICK: Protein, ur: NEGATIVE mg/dL

## 2022-11-26 LAB — CEA (ACCESS): CEA (CHCC): 102.47 ng/mL — ABNORMAL HIGH (ref 0.00–5.00)

## 2022-11-26 MED ORDER — SODIUM CHLORIDE 0.9 % IV SOLN: Freq: Once | INTRAVENOUS | Status: AC

## 2022-11-26 MED ORDER — ATROPINE SULFATE 1 MG/ML IV SOLN
0.5000 mg | Freq: Once | INTRAVENOUS | Status: AC | PRN
  Administered 2022-11-26: 0.5 mg via INTRAVENOUS
  Filled 2022-11-26: qty 1

## 2022-11-26 MED ORDER — SODIUM CHLORIDE 0.9 % IV SOLN
1600.0000 mg/m2 | INTRAVENOUS | Status: DC
  Administered 2022-11-26: 2850 mg via INTRAVENOUS
  Filled 2022-11-26: qty 57

## 2022-11-26 MED ORDER — SODIUM CHLORIDE 0.9 % IV SOLN
7.5000 mg/kg | Freq: Once | INTRAVENOUS | Status: AC
  Administered 2022-11-26: 500 mg via INTRAVENOUS
  Filled 2022-11-26: qty 16

## 2022-11-26 MED ORDER — SODIUM CHLORIDE 0.9 % IV SOLN
10.0000 mg | Freq: Once | INTRAVENOUS | Status: AC
  Administered 2022-11-26: 10 mg via INTRAVENOUS
  Filled 2022-11-26: qty 10

## 2022-11-26 MED ORDER — PALONOSETRON HCL INJECTION 0.25 MG/5ML
0.2500 mg | Freq: Once | INTRAVENOUS | Status: AC
  Administered 2022-11-26: 0.25 mg via INTRAVENOUS
  Filled 2022-11-26: qty 5

## 2022-11-26 MED ORDER — SODIUM CHLORIDE 0.9 % IV SOLN
400.0000 mg/m2 | Freq: Once | INTRAVENOUS | Status: AC
  Administered 2022-11-26: 712 mg via INTRAVENOUS
  Filled 2022-11-26: qty 35.6

## 2022-11-26 MED ORDER — SODIUM CHLORIDE 0.9 % IV SOLN
100.0000 mg/m2 | Freq: Once | INTRAVENOUS | Status: AC
  Administered 2022-11-26: 180 mg via INTRAVENOUS
  Filled 2022-11-26: qty 5

## 2022-11-26 NOTE — Progress Notes (Signed)
Patient seen by Lisa Thomas, NP today  Vitals are within treatment parameters.   Labs reviewed by Lisa Thomas, NP and are within treatment parameters.  Per physician team, patient is ready for treatment and there are NO modifications to the treatment plan. 

## 2022-11-26 NOTE — Patient Instructions (Signed)
[facility]   Discharge Instructions: Thank you for choosing [facility] to provide your oncology and hematology care.   If you have a lab appointment with the [facility], please go directly to the [facility] and check in at the registration area.   Wear comfortable clothing and clothing appropriate for easy access to any Portacath or PICC line.   We strive to give you quality time with your provider. You may need to reschedule your appointment if you arrive late (15 or more minutes).  Arriving late affects you and other patients whose appointments are after yours.  Also, if you miss three or more appointments without notifying the office, you may be dismissed from the clinic at the provider's discretion.      For prescription refill requests, have your pharmacy contact our office and allow 72 hours for refills to be completed.    Today you received the following chemotherapy and/or immunotherapy agents Bevacizumab-adcd (VEGZELMA), Irinotecan (CAMPTOSAR), Leucovorin & Flourouracil (ADRUCIL).      To help prevent nausea and vomiting after your treatment, we encourage you to take your nausea medication as directed.  BELOW ARE SYMPTOMS THAT SHOULD BE REPORTED IMMEDIATELY: *FEVER GREATER THAN 100.4 F (38 C) OR HIGHER *CHILLS OR SWEATING *NAUSEA AND VOMITING THAT IS NOT CONTROLLED WITH YOUR NAUSEA MEDICATION *UNUSUAL SHORTNESS OF BREATH *UNUSUAL BRUISING OR BLEEDING *URINARY PROBLEMS (pain or burning when urinating, or frequent urination) *BOWEL PROBLEMS (unusual diarrhea, constipation, pain near the anus) TENDERNESS IN MOUTH AND THROAT WITH OR WITHOUT PRESENCE OF ULCERS (sore throat, sores in mouth, or a toothache) UNUSUAL RASH, SWELLING OR PAIN  UNUSUAL VAGINAL DISCHARGE OR ITCHING   Items with * indicate a potential emergency and should be followed up as soon as possible or go to the Emergency Department if any problems should  occur.  Please show the CHEMOTHERAPY ALERT CARD or IMMUNOTHERAPY ALERT CARD at check-in to the Emergency Department and triage nurse.  Should you have questions after your visit or need to cancel or reschedule your appointment, please contact [facility]  Dept: [phone]  and follow the prompts.  Office hours are 8:00 a.m. to 4:30 p.m. Monday - Friday. Please note that voicemails left after 4:00 p.m. may not be returned until the following business day.  We are closed weekends and major holidays. You have access to a nurse at all times for urgent questions. Please call the main number to the clinic Dept: [phone] and follow the prompts.   For any non-urgent questions, you may also contact your provider using MyChart. We now offer e-Visits for anyone 66 and older to request care online for non-urgent symptoms. For details visit [email or web address].   Also download the MyChart app! Go to the app store, search "MyChart", open the app, select [facility], and log in with your MyChart username and password.  Bevacizumab Injection What is this medication? BEVACIZUMAB (be va SIZ yoo mab) treats some types of cancer. It works by blocking a protein that causes cancer cells to grow and multiply. This helps to slow or stop the spread of cancer cells. It is a monoclonal antibody. This medicine may be used for other purposes; ask your health care provider or pharmacist if you have questions. COMMON BRAND NAME(S): Alymsys, Avastin, MVASI, Noah Charon What should I tell my care team before I take this medication? They need to know if you have any of these conditions: Blood clots Coughing up  blood Having or recent surgery Heart failure High blood pressure History of a connection between 2 or more body parts that do not usually connect (fistula) History of a tear in your stomach or intestines Protein in your urine An unusual or allergic reaction to bevacizumab,  other medications, foods, dyes, or preservatives Pregnant or trying to get pregnant Breast-feeding How should I use this medication? This medication is injected into a vein. It is given by your care team in a hospital or clinic setting. Talk to your care team the use of this medication in children. Special care may be needed. Overdosage: If you think you have taken too much of this medicine contact a poison control center or emergency room at once. NOTE: This medicine is only for you. Do not share this medicine with others. What if I miss a dose? Keep appointments for follow-up doses. It is important not to miss your dose. Call your care team if you are unable to keep an appointment. What may interact with this medication? Interactions are not expected. This list may not describe all possible interactions. Give your health care provider a list of all the medicines, herbs, non-prescription drugs, or dietary supplements you use. Also tell them if you smoke, drink alcohol, or use illegal drugs. Some items may interact with your medicine. What should I watch for while using this medication? Your condition will be monitored carefully while you are receiving this medication. You may need blood work while taking this medication. This medication may make you feel generally unwell. This is not uncommon as chemotherapy can affect healthy cells as well as cancer cells. Report any side effects. Continue your course of treatment even though you feel ill unless your care team tells you to stop. This medication may increase your risk to bruise or bleed. Call your care team if you notice any unusual bleeding. Before having surgery, talk to your care team to make sure it is ok. This medication can increase the risk of poor healing of your surgical site or wound. You will need to stop this medication for 28 days before surgery. After surgery, wait at least 28 days before restarting this medication. Make sure the  surgical site or wound is healed enough before restarting this medication. Talk to your care team if questions. Talk to your care team if you may be pregnant. Serious birth defects can occur if you take this medication during pregnancy and for 6 months after the last dose. Contraception is recommended while taking this medication and for 6 months after the last dose. Your care team can help you find the option that works for you. Do not breastfeed while taking this medication and for 6 months after the last dose. This medication can cause infertility. Talk to your care team if you are concerned about your fertility. What side effects may I notice from receiving this medication? Side effects that you should report to your care team as soon as possible: Allergic reactions--skin rash, itching, hives, swelling of the face, lips, tongue, or throat Bleeding--bloody or black, tar-like stools, vomiting blood or brown material that looks like coffee grounds, red or dark brown urine, small red or purple spots on skin, unusual bruising or bleeding Blood clot--pain, swelling, or warmth in the leg, shortness of breath, chest pain Heart attack--pain or tightness in the chest, shoulders, arms, or jaw, nausea, shortness of breath, cold or clammy skin, feeling faint or lightheaded Heart failure--shortness of breath, swelling of the ankles, feet, or hands,  sudden weight gain, unusual weakness or fatigue Increase in blood pressure Infection--fever, chills, cough, sore throat, wounds that don't heal, pain or trouble when passing urine, general feeling of discomfort or being unwell Infusion reactions--chest pain, shortness of breath or trouble breathing, feeling faint or lightheaded Kidney injury--decrease in the amount of urine, swelling of the ankles, hands, or feet Stomach pain that is severe, does not go away, or gets worse Stroke--sudden numbness or weakness of the face, arm, or leg, trouble speaking, confusion,  trouble walking, loss of balance or coordination, dizziness, severe headache, change in vision Sudden and severe headache, confusion, change in vision, seizures, which may be signs of posterior reversible encephalopathy syndrome (PRES) Side effects that usually do not require medical attention (report to your care team if they continue or are bothersome): Back pain Change in taste Diarrhea Dry skin Increased tears Nosebleed This list may not describe all possible side effects. Call your doctor for medical advice about side effects. You may report side effects to FDA at 1-800-FDA-1088. Where should I keep my medication? This medication is given in a hospital or clinic. It will not be stored at home. NOTE: This sheet is a summary. It may not cover all possible information. If you have questions about this medicine, talk to your doctor, pharmacist, or health care provider.  2023 Elsevier/Gold Standard (2022-01-11 00:00:00)  Irinotecan Injection What is this medication? IRINOTECAN (ir in oh TEE kan) treats some types of cancer. It works by slowing down the growth of cancer cells. This medicine may be used for other purposes; ask your health care provider or pharmacist if you have questions. COMMON BRAND NAME(S): Camptosar What should I tell my care team before I take this medication? They need to know if you have any of these conditions: Dehydration Diarrhea Infection, especially a viral infection, such as chickenpox, cold sores, herpes Liver disease Low blood cell levels (white cells, red cells, and platelets) Low levels of electrolytes, such as calcium, magnesium, or potassium in your blood Recent or ongoing radiation An unusual or allergic reaction to irinotecan, other medications, foods, dyes, or preservatives If you or your partner are pregnant or trying to get pregnant Breast-feeding How should I use this medication? This medication is injected into a vein. It is given by your  care team in a hospital or clinic setting. Talk to your care team about the use of this medication in children. Special care may be needed. Overdosage: If you think you have taken too much of this medicine contact a poison control center or emergency room at once. NOTE: This medicine is only for you. Do not share this medicine with others. What if I miss a dose? Keep appointments for follow-up doses. It is important not to miss your dose. Call your care team if you are unable to keep an appointment. What may interact with this medication? Do not take this medication with any of the following: Cobicistat Itraconazole This medication may also interact with the following: Certain antibiotics, such as clarithromycin, rifampin, rifabutin Certain antivirals for HIV or AIDS Certain medications for fungal infections, such as ketoconazole, posaconazole, voriconazole Certain medications for seizures, such as carbamazepine, phenobarbital, phenytoin Gemfibrozil Nefazodone St. John's wort This list may not describe all possible interactions. Give your health care provider a list of all the medicines, herbs, non-prescription drugs, or dietary supplements you use. Also tell them if you smoke, drink alcohol, or use illegal drugs. Some items may interact with your medicine. What should I watch  for while using this medication? Your condition will be monitored carefully while you are receiving this medication. You may need blood work while taking this medication. This medication may make you feel generally unwell. This is not uncommon as chemotherapy can affect healthy cells as well as cancer cells. Report any side effects. Continue your course of treatment even though you feel ill unless your care team tells you to stop. This medication can cause serious side effects. To reduce the risk, your care team may give you other medications to take before receiving this one. Be sure to follow the directions from your  care team. This medication may affect your coordination, reaction time, or judgement. Do not drive or operate machinery until you know how this medication affects you. Sit up or stand slowly to reduce the risk of dizzy or fainting spells. Drinking alcohol with this medication can increase the risk of these side effects. This medication may increase your risk of getting an infection. Call your care team for advice if you get a fever, chills, sore throat, or other symptoms of a cold or flu. Do not treat yourself. Try to avoid being around people who are sick. Avoid taking medications that contain aspirin, acetaminophen, ibuprofen, naproxen, or ketoprofen unless instructed by your care team. These medications may hide a fever. This medication may increase your risk to bruise or bleed. Call your care team if you notice any unusual bleeding. Be careful brushing or flossing your teeth or using a toothpick because you may get an infection or bleed more easily. If you have any dental work done, tell your dentist you are receiving this medication. Talk to your care team if you or your partner are pregnant or think either of you might be pregnant. This medication can cause serious birth defects if taken during pregnancy and for 6 months after the last dose. You will need a negative pregnancy test before starting this medication. Contraception is recommended while taking this medication and for 6 months after the last dose. Your care team can help you find the option that works for you. Do not father a child while taking this medication and for 3 months after the last dose. Use a condom for contraception during this time period. Do not breastfeed while taking this medication and for 7 days after the last dose. This medication may cause infertility. Talk to your care team if you are concerned about your fertility. What side effects may I notice from receiving this medication? Side effects that you should report to  your care team as soon as possible: Allergic reactions--skin rash, itching, hives, swelling of the face, lips, tongue, or throat Dry cough, shortness of breath or trouble breathing Increased saliva or tears, increased sweating, stomach cramping, diarrhea, small pupils, unusual weakness or fatigue, slow heartbeat Infection--fever, chills, cough, sore throat, wounds that don't heal, pain or trouble when passing urine, general feeling of discomfort or being unwell Kidney injury--decrease in the amount of urine, swelling of the ankles, hands, or feet Low red blood cell level--unusual weakness or fatigue, dizziness, headache, trouble breathing Severe or prolonged diarrhea Unusual bruising or bleeding Side effects that usually do not require medical attention (report to your care team if they continue or are bothersome): Constipation Diarrhea Hair loss Loss of appetite Nausea Stomach pain This list may not describe all possible side effects. Call your doctor for medical advice about side effects. You may report side effects to FDA at 1-800-FDA-1088. Where should I keep my medication?  This medication is given in a hospital or clinic. It will not be stored at home. NOTE: This sheet is a summary. It may not cover all possible information. If you have questions about this medicine, talk to your doctor, pharmacist, or health care provider.  2023 Elsevier/Gold Standard (2022-01-17 00:00:00)  Leucovorin Injection What is this medication? LEUCOVORIN (loo koe VOR in) prevents side effects from certain medications, such as methotrexate. It works by increasing folate levels. This helps protect healthy cells in your body. It may also be used to treat anemia caused by low levels of folate. It can also be used with fluorouracil, a type of chemotherapy, to treat colorectal cancer. It works by increasing the effects of fluorouracil in the body. This medicine may be used for other purposes; ask your health care  provider or pharmacist if you have questions. What should I tell my care team before I take this medication? They need to know if you have any of these conditions: Anemia from low levels of vitamin B12 in the blood An unusual or allergic reaction to leucovorin, folic acid, other medications, foods, dyes, or preservatives Pregnant or trying to get pregnant Breastfeeding How should I use this medication? This medication is injected into a vein or a muscle. It is given by your care team in a hospital or clinic setting. Talk to your care team about the use of this medication in children. Special care may be needed. Overdosage: If you think you have taken too much of this medicine contact a poison control center or emergency room at once. NOTE: This medicine is only for you. Do not share this medicine with others. What if I miss a dose? Keep appointments for follow-up doses. It is important not to miss your dose. Call your care team if you are unable to keep an appointment. What may interact with this medication? Capecitabine Fluorouracil Phenobarbital Phenytoin Primidone Trimethoprim;sulfamethoxazole This list may not describe all possible interactions. Give your health care provider a list of all the medicines, herbs, non-prescription drugs, or dietary supplements you use. Also tell them if you smoke, drink alcohol, or use illegal drugs. Some items may interact with your medicine. What should I watch for while using this medication? Your condition will be monitored carefully while you are receiving this medication. This medication may increase the side effects of 5-fluorouracil. Tell your care team if you have diarrhea or mouth sores that do not get better or that get worse. What side effects may I notice from receiving this medication? Side effects that you should report to your care team as soon as possible: Allergic reactions--skin rash, itching, hives, swelling of the face, lips, tongue,  or throat This list may not describe all possible side effects. Call your doctor for medical advice about side effects. You may report side effects to FDA at 1-800-FDA-1088. Where should I keep my medication? This medication is given in a hospital or clinic. It will not be stored at home. NOTE: This sheet is a summary. It may not cover all possible information. If you have questions about this medicine, talk to your doctor, pharmacist, or health care provider.  2023 Elsevier/Gold Standard (2022-01-18 00:00:00)  Fluorouracil Injection What is this medication? FLUOROURACIL (flure oh YOOR a sil) treats some types of cancer. It works by slowing down the growth of cancer cells. This medicine may be used for other purposes; ask your health care provider or pharmacist if you have questions. COMMON BRAND NAME(S): Adrucil What should I tell  my care team before I take this medication? They need to know if you have any of these conditions: Blood disorders Dihydropyrimidine dehydrogenase (DPD) deficiency Infection, such as chickenpox, cold sores, herpes Kidney disease Liver disease Poor nutrition Recent or ongoing radiation therapy An unusual or allergic reaction to fluorouracil, other medications, foods, dyes, or preservatives If you or your partner are pregnant or trying to get pregnant Breast-feeding How should I use this medication? This medication is injected into a vein. It is administered by your care team in a hospital or clinic setting. Talk to your care team about the use of this medication in children. Special care may be needed. Overdosage: If you think you have taken too much of this medicine contact a poison control center or emergency room at once. NOTE: This medicine is only for you. Do not share this medicine with others. What if I miss a dose? Keep appointments for follow-up doses. It is important not to miss your dose. Call your care team if you are unable to keep an  appointment. What may interact with this medication? Do not take this medication with any of the following: Live virus vaccines This medication may also interact with the following: Medications that treat or prevent blood clots, such as warfarin, enoxaparin, dalteparin This list may not describe all possible interactions. Give your health care provider a list of all the medicines, herbs, non-prescription drugs, or dietary supplements you use. Also tell them if you smoke, drink alcohol, or use illegal drugs. Some items may interact with your medicine. What should I watch for while using this medication? Your condition will be monitored carefully while you are receiving this medication. This medication may make you feel generally unwell. This is not uncommon as chemotherapy can affect healthy cells as well as cancer cells. Report any side effects. Continue your course of treatment even though you feel ill unless your care team tells you to stop. In some cases, you may be given additional medications to help with side effects. Follow all directions for their use. This medication may increase your risk of getting an infection. Call your care team for advice if you get a fever, chills, sore throat, or other symptoms of a cold or flu. Do not treat yourself. Try to avoid being around people who are sick. This medication may increase your risk to bruise or bleed. Call your care team if you notice any unusual bleeding. Be careful brushing or flossing your teeth or using a toothpick because you may get an infection or bleed more easily. If you have any dental work done, tell your dentist you are receiving this medication. Avoid taking medications that contain aspirin, acetaminophen, ibuprofen, naproxen, or ketoprofen unless instructed by your care team. These medications may hide a fever. Do not treat diarrhea with over the counter products. Contact your care team if you have diarrhea that lasts more than 2  days or if it is severe and watery. This medication can make you more sensitive to the sun. Keep out of the sun. If you cannot avoid being in the sun, wear protective clothing and sunscreen. Do not use sun lamps, tanning beds, or tanning booths. Talk to your care team if you or your partner wish to become pregnant or think you might be pregnant. This medication can cause serious birth defects if taken during pregnancy and for 3 months after the last dose. A reliable form of contraception is recommended while taking this medication and for 3 months after  the last dose. Talk to your care team about effective forms of contraception. Do not father a child while taking this medication and for 3 months after the last dose. Use a condom while having sex during this time period. Do not breastfeed while taking this medication. This medication may cause infertility. Talk to your care team if you are concerned about your fertility. What side effects may I notice from receiving this medication? Side effects that you should report to your care team as soon as possible: Allergic reactions--skin rash, itching, hives, swelling of the face, lips, tongue, or throat Heart attack--pain or tightness in the chest, shoulders, arms, or jaw, nausea, shortness of breath, cold or clammy skin, feeling faint or lightheaded Heart failure--shortness of breath, swelling of the ankles, feet, or hands, sudden weight gain, unusual weakness or fatigue Heart rhythm changes--fast or irregular heartbeat, dizziness, feeling faint or lightheaded, chest pain, trouble breathing High ammonia level--unusual weakness or fatigue, confusion, loss of appetite, nausea, vomiting, seizures Infection--fever, chills, cough, sore throat, wounds that don't heal, pain or trouble when passing urine, general feeling of discomfort or being unwell Low red blood cell level--unusual weakness or fatigue, dizziness, headache, trouble breathing Pain, tingling, or  numbness in the hands or feet, muscle weakness, change in vision, confusion or trouble speaking, loss of balance or coordination, trouble walking, seizures Redness, swelling, and blistering of the skin over hands and feet Severe or prolonged diarrhea Unusual bruising or bleeding Side effects that usually do not require medical attention (report to your care team if they continue or are bothersome): Dry skin Headache Increased tears Nausea Pain, redness, or swelling with sores inside the mouth or throat Sensitivity to light Vomiting This list may not describe all possible side effects. Call your doctor for medical advice about side effects. You may report side effects to FDA at 1-800-FDA-1088. Where should I keep my medication? This medication is given in a hospital or clinic. It will not be stored at home. NOTE: This sheet is a summary. It may not cover all possible information. If you have questions about this medicine, talk to your doctor, pharmacist, or health care provider.  2023 Elsevier/Gold Standard (2022-01-08 00:00:00)  The chemotherapy medication bag should finish at 46 hours, 96 hours, or 7 days. For example, if your pump is scheduled for 46 hours and it was put on at 4:00 p.m., it should finish at 2:00 p.m. the day it is scheduled to come off regardless of your appointment time.     Estimated time to finish at 12:30 p.m. on Thursday 11/28/2022.   If the display on your pump reads "Low Volume" and it is beeping, take the batteries out of the pump and come to the cancer center for it to be taken off.   If the pump alarms go off prior to the pump reading "Low Volume" then call [phone] and someone can assist you.  If the plunger comes out and the chemotherapy medication is leaking out, please use your home chemo spill kit to clean up the spill. Do NOT use paper towels or other household products.  If you have problems or questions regarding your pump, please call either  [phone] (24 hours a day) or the cancer center Monday-Friday 8:00 a.m.- 4:30 p.m. at the clinic number and we will assist you. If you are unable to get assistance, then go to the nearest Emergency Department and ask the staff to contact the IV team for assistance.

## 2022-11-26 NOTE — Progress Notes (Signed)
[facility] OFFICE PROGRESS NOTE   Diagnosis: Rectal cancer  INTERVAL HISTORY:   Neil Perry returns as scheduled.  He completed cycle 8 FOLFIRI/bevacizumab 11/05/2022.  He denies nausea/vomiting.  No mouth sores.  No diarrhea.  He notes less fatigue and improved taste on the 3-week schedule.  No bleeding except "very mild" with nose blowing intermittently.  Objective:  Vital signs in last 24 hours:  Blood pressure (!) 145/82, pulse 73, temperature 98.1 F (36.7 C), temperature source Oral, resp. rate 18, height '5\' 9"'$  (1.753 m), weight 146 lb 9.6 oz (66.5 kg), SpO2 100 %.    HEENT: No thrush or ulcers. Resp: Lungs clear bilaterally. Cardio: Regular rate and rhythm. GI: Abdomen soft and nontender.  No hepatosplenomegaly.   Vascular: No leg edema. Neuro: Alert and oriented. Skin: Palms without erythema. Port-A-Cath without erythema.   Lab Results:  Lab Results  Component Value Date   WBC 3.9 (L) 11/26/2022   HGB 13.5 11/26/2022   HCT 40.1 11/26/2022   MCV 82.9 11/26/2022   PLT 248 11/26/2022   NEUTROABS 2.8 11/26/2022    Imaging:  No results found.  Medications: I have reviewed the patient's current medications.  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; foundation 1-microsatellite stable, tumor mutation burden 5, K-rasG 12V, NRAS wildtype Colonoscopy 05/22/2021-ulcerated partially obstructing mass at 15 cm from anal verge CTs 05/30/2021-numerous small pulmonary nodules concerning for metastases, thickening of the rectum, multiple liver metastases Cycle 1 FOLFOX 06/12/2021 Cycle 2 FOLFOX 06/26/2021 Cycle 3 FOLFOX 07/10/2021,  oxaliplatin dose reduced due to progressive decline in the [place] and platelet count Cycle 4 FOLFOX 07/24/2021 Cycle 5 FOLFOX 08/07/2021, 5-FU bolus eliminated and oxaliplatin dose reduced, insurance would not approve Udenyca CTs 08/15/2021-decrease size of lung nodules, decreased hepatic metastases, persistent anorectal wall thickening, stable right renal mass Cycle 6 FOLFOX 08/21/2021, 5-FU bolus and 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 Cycle 7 FOLFOX 09/04/2021, 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 Cycle 8 FOLFOX 09/18/2021, 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 Cycle 9 FOLFOX 10/08/2021, 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 Cycle 10 FOLFOX 10/23/2021, 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 CTs 11/01/2021-decrease in size of occasional small pulmonary nodules, continued decrease in size of multiple hypoenhancing hepatic metastases, unchanged posttreatment appearance of the low rectum, unchanged exophytic mass of the inferior pole of the right kidney measuring 1.5 x 1.3 cm Cycle 11 5-fluorouracil 11/06/2021 Cycle 12 5-fluorouracil 11/20/2021 Cycle 13 5-fluorouracil 12/04/2021 Maintenance Xeloda beginning 12/19/2021 Xeloda dose reduced to 1000 mg twice daily beginning 12/27/2021 Xeloda placed on hold 01/16/2022 CTs 01/22/2022-segment of distal ileum with circumferential wall thickening and perienteric inflammation, decrease in size of liver metastases, stable tiny bilateral pulmonary nodules, circumferential wall thickening in the rectum, stable lower pole right kidney lesion suspicious for renal cell carcinoma Xeloda discontinued due to diarrhea 5-fluorouracil pump 02/05/2022 5-fluorouracil pump 02/26/2022 Treatment held 03/18/2022 due to diarrhea 5-fluorouracil pump 03/25/2022, dose reduced due to diarrhea 5-fluorouracil pump 04/16/2022 CTs 05/03/2022-unchanged tiny pulmonary nodules, unchanged liver metastases, stable appearance of the low  rectal wall thickening, unchanged posterior right renal mass 5-fluorouracil pump 05/07/2022 5-fluorouracil pump 05/28/2022 5-fluorouracil pump 06/18/2022 CTs 07/04/2022-increased size of liver metastases, no new lesions, new 3 mm right lower lobe nodule-nonspecific Cycle 1 FOLFIRI/bevacizumab 07/16/2022 Cycle 2 FOLFIRI/bevacizumab  08/05/2022; 5-FU bolus eliminated, irinotecan dose reduced due to diarrhea and weight loss Cycle 3 FOLFIRI/bevacizumab 08/21/2022 Cycle 4 FOLFIRI/bevacizumab 09/03/2022 Cycle 5 FOLFIRI/bevacizumab 09/17/2022 CTs 09/26/2022-stable rectal thickening, stable hepatic metastases (dominant lesion appears smaller by my review), small indeterminate pulmonary nodules, unchanged exophytic enhancing lesion at the lower pole of the right kidney Cycle 6 FOLFIRI/bevacizumab 10/01/2022 Cycle 7 FOLFIRI/bevacizumab 10/15/2022, irinotecan dose reduced due to diarrhea Cycle 8 FOLFIRI/bevacizumab 11/05/2022 Cycle 9 FOLFIRI/bevacizumab 11/26/2022 Prostate cancer-simple prostatectomy January 2019, Gleason 3+3, 10 to 12% of specimen Active surveillance, biopsy May 2019 with small focus of Gleason 3+3 disease in 1/6 cores, surveillance continued Elevated PSA 04/20/2020 Biopsy 06/19/2020 8/12 cores positive, Gleason 4+4 PET scan 07/07/2020-negative CT 07/07/2020 right iliac and retrocaval adenopathy, 1.3 cm subcapsular right lower pole renal lesion Androgen deprivation therapy beginning 08/21/2020 Radiation to prostate, seminal vesicles, and pelvic lymph nodes to 10/22 - 12/28/2020 He continues every 40-monthFirmagon and daily Xtandi 08/21/2022 patient reports Xtandi discontinued   3.  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  Mitral valve prolapse 5.  Family history of prostate cancer 6.  08/15/2021 with leg weakness and an episode of slurred speech-TIA?,  Brain imaging negative 7.  Diarrhea and fatigue while on capecitabine  April 2023-capecitabine discontinued 01/18/2022    Disposition: Mr. RMirkinappears stable.  He has completed 8 cycles of FOLFIRI/bevacizumab.  There is no clinical evidence of disease progression.  He notes improved tolerance of chemotherapy on the 3-week schedule.  Plan is to continue the same, cycle 9 FOLFIRI/bevacizumab today.  CBC and chemistry panel reviewed.  Labs adequate to proceed as above.  He will return for follow-up in 3 weeks.    LNed CardANP/GNP-BC   11/26/2022  11:30 AM

## 2022-11-28 ENCOUNTER — Inpatient Hospital Stay: Payer: Medicare Other

## 2022-11-28 VITALS — BP 158/85 | HR 65 | Temp 97.9°F | Resp 18

## 2022-11-28 DIAGNOSIS — Z5111 Encounter for antineoplastic chemotherapy: Secondary | ICD-10-CM | POA: Diagnosis not present

## 2022-11-28 DIAGNOSIS — C20 Malignant neoplasm of rectum: Secondary | ICD-10-CM

## 2022-11-28 MED ORDER — SODIUM CHLORIDE 0.9% FLUSH
10.0000 mL | INTRAVENOUS | Status: DC | PRN
  Administered 2022-11-28: 10 mL

## 2022-11-28 MED ORDER — HEPARIN SOD (PORK) LOCK FLUSH 100 UNIT/ML IV SOLN
500.0000 [IU] | Freq: Once | INTRAVENOUS | Status: AC | PRN
  Administered 2022-11-28: 500 [IU]

## 2022-11-28 NOTE — Patient Instructions (Signed)

## 2022-12-03 ENCOUNTER — Telehealth: Payer: Self-pay | Admitting: Pharmacist

## 2022-12-03 NOTE — Progress Notes (Signed)
Care Management & Coordination Services Pharmacy Team  Reason for Encounter: General adherence update   Contacted patient for general health update and medication adherence call.  {US HC Outreach:28874}   What concerns do you have about your medications?  The patient {denies/reports:25180} side effects with their medications.   How often do you forget or accidentally miss a dose? {missed doses:25554}  Do you use a pillbox? {yes/no:20286}  Are you having any problems getting your medications from your pharmacy? {yes/no:20286}  Has the cost of your medications been a concern? {yes/no:20286} If yes, what medication and is patient assistance available or has it been applied for?  Since last visit with PharmD, {no/thefollowing:25210} interventions have been made.   The patient {has/has not:25209} had an ED visit since last contact.   The patient {denies/reports:25180} problems with their health.   Patient {denies/reports:25180} concerns or questions for ***, PharmD at this time.   Counseled patient on: {GENERALCOUNSELING:28686}   Chart Updates:  Recent office visits:  ***  Recent consult visits:  ***  Hospital visits:  {Hospital DC Yes/No:21091515}  Medications: Outpatient Encounter Medications as of 12/03/2022  Medication Sig   amLODipine (NORVASC) 5 MG tablet TAKE 1 TABLET BY MOUTH EVERY DAY   aspirin EC 81 MG EC tablet Take 1 tablet (81 mg total) by mouth daily. Swallow whole.   diphenoxylate-atropine (LOMOTIL) 2.5-0.025 MG tablet TAKE 1 OR 2 TABLETS BY MOUTH 4 TIMES DAILY AS NEEDED FOR DIARRHEA OR LOOSE STOOLS (Patient not taking: Reported on 11/05/2022)   lidocaine-prilocaine (EMLA) cream Apply 1 application topically as directed. Apply 1/2 tablespoon to port site 2 hours prior to stick and cover with Press and Seal to numb site   loperamide (IMODIUM) 2 MG capsule Take 2 mg by mouth as needed for diarrhea or loose stools. (Patient not taking: Reported on 10/29/2022)    Melatonin 3 MG CAPS Take 3 mg by mouth at bedtime as needed (sleep).   ondansetron (ZOFRAN) 8 MG tablet Take 8 mg by mouth every 8 (eight) hours as needed for nausea or vomiting. (Patient not taking: Reported on 10/15/2022)   prochlorperazine (COMPAZINE) 10 MG tablet Take 10 mg by mouth every 6 (six) hours as needed for nausea or vomiting. (Patient not taking: Reported on 09/03/2022)   Facility-Administered Encounter Medications as of 12/03/2022  Medication   sodium chloride flush (NS) 0.9 % injection 10 mL   sodium chloride flush (NS) 0.9 % injection 10 mL    Recent vitals BP Readings from Last 3 Encounters:  11/28/22 (!) 158/85  11/26/22 (!) 140/75  11/26/22 (!) 145/82   Pulse Readings from Last 3 Encounters:  11/28/22 65  11/26/22 64  11/26/22 73   Wt Readings from Last 3 Encounters:  11/26/22 146 lb 9.6 oz (66.5 kg)  11/05/22 145 lb (65.8 kg)  10/29/22 144 lb (65.3 kg)   BMI Readings from Last 3 Encounters:  11/26/22 21.65 kg/m  11/05/22 21.41 kg/m  10/29/22 21.27 kg/m    Recent lab results    Component Value Date/Time   NA 140 11/26/2022 1010   K 3.9 11/26/2022 1010   CL 108 11/26/2022 1010   CO2 26 11/26/2022 1010   GLUCOSE 96 11/26/2022 1010   BUN 13 11/26/2022 1010   CREATININE 0.84 11/26/2022 1010   CALCIUM 9.4 11/26/2022 1010    Lab Results  Component Value Date   CREATININE 0.84 11/26/2022   GFRNONAA >60 11/26/2022   Lab Results  Component Value Date/Time   HGBA1C 4.4 (L) 08/16/2021  06:15 AM    Lab Results  Component Value Date   CHOL 118 08/16/2021   HDL 25 (L) 08/16/2021   LDLCALC 51 08/16/2021   TRIG 208 (H) 08/16/2021   CHOLHDL 4.7 08/16/2021    Care Gaps: Annual wellness visit in last year? No  Star Rating Drugs: None   Future Appointments  Date Time Provider [place]  12/17/2022 10:00 AM [facility] PHLEBOTOMIST [facility] None  12/17/2022 10:15 AM [facility] FLUSH ROOM [facility] None  12/17/2022 10:40 AM Ladell Pier,  MD [facility] None  12/17/2022 11:00 AM [facility] CHAIR 1 [facility] None  12/19/2022  1:30 PM [facility] FLUSH ROOM [facility] None  01/07/2023 10:00 AM [facility] PHLEBOTOMIST [facility] None  01/07/2023 10:15 AM [facility] FLUSH ROOM [facility] None  01/07/2023 10:40 AM Ladell Pier, MD [facility] None  01/07/2023 11:30 AM [facility] CHAIR 2 [facility] None  01/09/2023  2:00 PM [facility] FLUSH ROOM [facility] None  01/13/2023  1:00 PM Edythe Clarity, [place] None   April D Calhoun, [place] Pharmacist Assistant [phone]

## 2022-12-05 NOTE — Telephone Encounter (Signed)
Patient to take two '5mg'$  tablets for now.  Knows to make an appointment for follow up on this for new rx.

## 2022-12-05 NOTE — Telephone Encounter (Signed)
I am fine with him increasing the dose though he needs to come in for an office visit. He has not been seen in our office since 2021.  Algis Greenhouse. Jerline Pain, MD 12/05/2022 7:42 AM

## 2022-12-10 ENCOUNTER — Telehealth: Payer: Self-pay

## 2022-12-10 NOTE — Telephone Encounter (Signed)
Returned VM as requested by pt. Pt reports insomnia over the last 2 days at night. Pt reports taking Melatonin with no relief. This RN recommended trying OTC benadryl instead of melatonin tonight. Pt verbalizes understanding and agrees with plan of care

## 2022-12-15 ENCOUNTER — Other Ambulatory Visit: Payer: Self-pay | Admitting: Oncology

## 2022-12-15 DIAGNOSIS — C20 Malignant neoplasm of rectum: Secondary | ICD-10-CM

## 2022-12-16 ENCOUNTER — Other Ambulatory Visit: Payer: Self-pay | Admitting: Oncology

## 2022-12-16 DIAGNOSIS — C20 Malignant neoplasm of rectum: Secondary | ICD-10-CM

## 2022-12-17 ENCOUNTER — Inpatient Hospital Stay: Payer: Medicare Other

## 2022-12-17 ENCOUNTER — Encounter: Payer: Self-pay | Admitting: *Deleted

## 2022-12-17 ENCOUNTER — Inpatient Hospital Stay (HOSPITAL_BASED_OUTPATIENT_CLINIC_OR_DEPARTMENT_OTHER): Payer: Medicare Other | Admitting: Oncology

## 2022-12-17 ENCOUNTER — Encounter: Payer: Self-pay | Admitting: Oncology

## 2022-12-17 DIAGNOSIS — C20 Malignant neoplasm of rectum: Secondary | ICD-10-CM

## 2022-12-17 DIAGNOSIS — Z5111 Encounter for antineoplastic chemotherapy: Secondary | ICD-10-CM | POA: Diagnosis not present

## 2022-12-17 LAB — CBC WITH DIFFERENTIAL (CANCER CENTER ONLY)
Abs Immature Granulocytes: 0.03 10*3/uL (ref 0.00–0.07)
Basophils Absolute: 0 10*3/uL (ref 0.0–0.1)
Basophils Relative: 0 %
Eosinophils Absolute: 0.1 10*3/uL (ref 0.0–0.5)
Eosinophils Relative: 2 %
HCT: 40 % (ref 39.0–52.0)
Hemoglobin: 13.4 g/dL (ref 13.0–17.0)
Immature Granulocytes: 1 %
Lymphocytes Relative: 13 %
Lymphs Abs: 0.7 10*3/uL (ref 0.7–4.0)
MCH: 28.3 pg (ref 26.0–34.0)
MCHC: 33.5 g/dL (ref 30.0–36.0)
MCV: 84.4 fL (ref 80.0–100.0)
Monocytes Absolute: 0.5 10*3/uL (ref 0.1–1.0)
Monocytes Relative: 9 %
Neutro Abs: 3.8 10*3/uL (ref 1.7–7.7)
Neutrophils Relative %: 75 %
Platelet Count: 274 10*3/uL (ref 150–400)
RBC: 4.74 MIL/uL (ref 4.22–5.81)
RDW: 16.5 % — ABNORMAL HIGH (ref 11.5–15.5)
WBC Count: 5.1 10*3/uL (ref 4.0–10.5)
nRBC: 0 % (ref 0.0–0.2)

## 2022-12-17 LAB — CMP (CANCER CENTER ONLY)
ALT: 11 U/L (ref 0–44)
AST: 13 U/L — ABNORMAL LOW (ref 15–41)
Albumin: 4 g/dL (ref 3.5–5.0)
Alkaline Phosphatase: 63 U/L (ref 38–126)
Anion gap: 5 (ref 5–15)
BUN: 11 mg/dL (ref 8–23)
CO2: 27 mmol/L (ref 22–32)
Calcium: 9.5 mg/dL (ref 8.9–10.3)
Chloride: 107 mmol/L (ref 98–111)
Creatinine: 0.85 mg/dL (ref 0.61–1.24)
GFR, Estimated: >60 mL/min (ref >60)
Glucose, Bld: 101 mg/dL — ABNORMAL HIGH (ref 70–99)
Potassium: 4 mmol/L (ref 3.5–5.1)
Sodium: 139 mmol/L (ref 135–145)
Total Bilirubin: 0.7 mg/dL (ref 0.3–1.2)
Total Protein: 6 g/dL — ABNORMAL LOW (ref 6.5–8.1)

## 2022-12-17 LAB — CEA (ACCESS): CEA (CHCC): 130.51 ng/mL — ABNORMAL HIGH (ref 0.00–5.00)

## 2022-12-17 LAB — TOTAL PROTEIN, URINE DIPSTICK: Protein, ur: NEGATIVE mg/dL

## 2022-12-17 MED ORDER — SODIUM CHLORIDE 0.9 % IV SOLN: Freq: Once | INTRAVENOUS | Status: AC

## 2022-12-17 MED ORDER — SODIUM CHLORIDE 0.9 % IV SOLN
400.0000 mg/m2 | Freq: Once | INTRAVENOUS | Status: AC
  Administered 2022-12-17: 712 mg via INTRAVENOUS
  Filled 2022-12-17: qty 35.6

## 2022-12-17 MED ORDER — SODIUM CHLORIDE 0.9 % IV SOLN
1600.0000 mg/m2 | INTRAVENOUS | Status: DC
  Administered 2022-12-17: 2850 mg via INTRAVENOUS
  Filled 2022-12-17: qty 57

## 2022-12-17 MED ORDER — SODIUM CHLORIDE 0.9 % IV SOLN
10.0000 mg | Freq: Once | INTRAVENOUS | Status: AC
  Administered 2022-12-17: 10 mg via INTRAVENOUS
  Filled 2022-12-17: qty 10

## 2022-12-17 MED ORDER — SODIUM CHLORIDE 0.9% FLUSH
10.0000 mL | INTRAVENOUS | Status: DC | PRN
  Administered 2022-12-17: 10 mL

## 2022-12-17 MED ORDER — ATROPINE SULFATE 1 MG/ML IV SOLN
0.5000 mg | Freq: Once | INTRAVENOUS | Status: AC | PRN
  Administered 2022-12-17: 0.5 mg via INTRAVENOUS
  Filled 2022-12-17: qty 1

## 2022-12-17 MED ORDER — SODIUM CHLORIDE 0.9 % IV SOLN
100.0000 mg/m2 | Freq: Once | INTRAVENOUS | Status: AC
  Administered 2022-12-17: 180 mg via INTRAVENOUS
  Filled 2022-12-17: qty 5

## 2022-12-17 MED ORDER — SODIUM CHLORIDE 0.9 % IV SOLN
7.5000 mg/kg | Freq: Once | INTRAVENOUS | Status: AC
  Administered 2022-12-17: 500 mg via INTRAVENOUS
  Filled 2022-12-17: qty 16

## 2022-12-17 MED ORDER — PALONOSETRON HCL INJECTION 0.25 MG/5ML
0.2500 mg | Freq: Once | INTRAVENOUS | Status: AC
  Administered 2022-12-17: 0.25 mg via INTRAVENOUS
  Filled 2022-12-17: qty 5

## 2022-12-17 NOTE — Progress Notes (Signed)
Patient seen by Dr. [name] today  Vitals are within treatment parameters.No intervention needed for BP 150/84  Labs reviewed by Dr. [name] and are within treatment parameters.  Per physician team, patient is ready for treatment and there are NO modifications to the treatment plan.

## 2022-12-17 NOTE — Progress Notes (Signed)
[facility] OFFICE PROGRESS NOTE   Diagnosis: Rectal cancer  INTERVAL HISTORY:   Neil Perry returns as scheduled.  He completed another cycle of FOLFIRI/bevacizumab on 11/26/2022.  No nausea/vomiting, mouth sores, or diarrhea.  He reports fatigue and anorexia during the week following chemotherapy.  He complains of insomnia.  Melatonin and Benadryl did not help.  He tried over-the-counter Sominex from his wife and this worked.  Objective:  Vital signs in last 24 hours:  Blood pressure (!) 150/84, pulse 73, temperature 98.1 F (36.7 C), temperature source Oral, resp. rate 18, height 5\' 9"  (1.753 m), weight 140 lb 3.2 oz (63.6 kg), SpO2 100 %.    HEENT: No thrush or ulcers Resp: Lungs clear bilaterally Cardio: Regular rate and rhythm GI: No hepatosplenomegaly Vascular: The left lower leg is slightly larger than the right side, no erythema or tenderness  Portacath/PICC-without erythema  Lab Results:  Lab Results  Component Value Date   WBC 5.1 12/17/2022   HGB 13.4 12/17/2022   HCT 40.0 12/17/2022   MCV 84.4 12/17/2022   PLT 274 12/17/2022   NEUTROABS 3.8 12/17/2022    CMP  Lab Results  Component Value Date   NA 140 11/26/2022   K 3.9 11/26/2022   CL 108 11/26/2022   CO2 26 11/26/2022   GLUCOSE 96 11/26/2022   BUN 13 11/26/2022   CREATININE 0.84 11/26/2022   CALCIUM 9.4 11/26/2022   PROT 6.3 (L) 11/26/2022   ALBUMIN 4.0 11/26/2022   AST 12 (L) 11/26/2022   ALT 13 11/26/2022   ALKPHOS 64 11/26/2022   BILITOT 0.7 11/26/2022   GFRNONAA >60 11/26/2022    Lab Results  Component Value Date   CEA 102.47 (H) 11/26/2022     Medications: I have reviewed the patient's current medications.   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 2-3 new hypervascular masses in the liver dome, no abdominal lymphadenopathy, stable benign left adrenal adenoma, stable right lower pole kidney mass Ultrasound-guided biopsy of a liver lesion  05/07/2021-metastatic adenocarcinoma with extensive necrosis, immunohistochemical profile consistent with a colorectal primary; foundation 1-microsatellite stable, tumor mutation burden 5, K-rasG 12V, NRAS wildtype Colonoscopy 05/22/2021-ulcerated partially obstructing mass at 15 cm from anal verge CTs 05/30/2021-numerous small pulmonary nodules concerning for metastases, thickening of the rectum, multiple liver metastases Cycle 1 FOLFOX 06/12/2021 Cycle 2 FOLFOX 06/26/2021 Cycle 3 FOLFOX 07/10/2021, oxaliplatin dose reduced due to progressive decline in the [place] and platelet count Cycle 4 FOLFOX 07/24/2021 Cycle 5 FOLFOX 08/07/2021, 5-FU bolus eliminated and oxaliplatin dose reduced, insurance would not approve Udenyca CTs 08/15/2021-decrease size of lung nodules, decreased hepatic metastases, persistent anorectal wall thickening, stable right renal mass Cycle 6 FOLFOX 08/21/2021, 5-FU bolus and 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 Cycle 7 FOLFOX 09/04/2021, 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 Cycle 8 FOLFOX 09/18/2021, 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 Cycle 9 FOLFOX 10/08/2021, 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 Cycle 10 FOLFOX 10/23/2021, 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 CTs 11/01/2021-decrease in size of occasional small pulmonary nodules, continued decrease in size of multiple hypoenhancing hepatic metastases, unchanged posttreatment appearance of the low rectum, unchanged exophytic mass of the inferior pole of the right kidney measuring 1.5 x 1.3 cm Cycle 11 5-fluorouracil 11/06/2021 Cycle 12 5-fluorouracil 11/20/2021 Cycle 13 5-fluorouracil 12/04/2021 Maintenance Xeloda beginning 12/19/2021 Xeloda dose reduced to 1000 mg twice daily beginning 12/27/2021 Xeloda placed on hold 01/16/2022 CTs 01/22/2022-segment of distal ileum with circumferential wall thickening and perienteric inflammation, decrease in size of liver  metastases, stable tiny bilateral pulmonary nodules, circumferential wall thickening in the rectum, stable lower pole right kidney lesion suspicious for renal cell carcinoma Xeloda discontinued due to diarrhea 5-fluorouracil pump 02/05/2022 5-fluorouracil pump 02/26/2022 Treatment held 03/18/2022 due to diarrhea 5-fluorouracil pump 03/25/2022, dose reduced due to diarrhea 5-fluorouracil pump 04/16/2022 CTs 05/03/2022-unchanged tiny pulmonary nodules, unchanged liver metastases, stable appearance of the low rectal wall thickening, unchanged posterior right renal mass 5-fluorouracil pump 05/07/2022 5-fluorouracil pump 05/28/2022 5-fluorouracil pump 06/18/2022 CTs 07/04/2022-increased size of liver metastases, no new lesions, new 3 mm right lower lobe nodule-nonspecific Cycle 1 FOLFIRI/bevacizumab 07/16/2022 Cycle 2 FOLFIRI/bevacizumab 08/05/2022; 5-FU bolus eliminated, irinotecan dose reduced due to diarrhea and weight loss Cycle 3 FOLFIRI/bevacizumab 08/21/2022 Cycle 4 FOLFIRI/bevacizumab 09/03/2022 Cycle 5 FOLFIRI/bevacizumab 09/17/2022 CTs 09/26/2022-stable rectal thickening, stable hepatic metastases (dominant lesion appears smaller by my review), small indeterminate pulmonary nodules, unchanged exophytic enhancing lesion at the lower pole of the right kidney Cycle 6 FOLFIRI/bevacizumab 10/01/2022 Cycle 7 FOLFIRI/bevacizumab 10/15/2022, irinotecan dose reduced due to diarrhea Cycle 8 FOLFIRI/bevacizumab 11/05/2022 Cycle 9 FOLFIRI/bevacizumab 11/26/2022 Cycle 10 FOLFIRI/bevacizumab 12/17/2022 Prostate cancer-simple prostatectomy January 2019, Gleason 3+3, 10 to 12% of specimen Active surveillance, biopsy May 2019 with small focus of Gleason 3+3 disease in 1/6 cores, surveillance continued Elevated PSA 04/20/2020 Biopsy 06/19/2020 8/12 cores positive, Gleason 4+4 PET scan 07/07/2020-negative CT 07/07/2020 right iliac and retrocaval adenopathy, 1.3 cm subcapsular right lower pole renal lesion Androgen  deprivation therapy beginning 08/21/2020 Radiation to prostate, seminal vesicles, and pelvic lymph nodes to 10/22 - 12/28/2020 He continues every 72-month Firmagon and daily Xtandi 08/21/2022 patient reports Xtandi discontinued   3.  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  Mitral valve prolapse 5.  Family history of prostate cancer 6.  08/15/2021 with leg weakness and an episode of slurred speech-TIA?,  Brain imaging negative 7.  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  He is tolerating FOLFIRI/bevacizumab well.  He will complete another cycle today.  The CEA has been higher over the past month.  He will undergo a restaging CT evaluation prior to an office visit in 3 weeks.  He will contact us for persistent insomnia.  Betsy Coder, MD  12/17/2022  11:02 AM

## 2022-12-17 NOTE — Patient Instructions (Signed)

## 2022-12-17 NOTE — Patient Instructions (Signed)
[facility]   Discharge Instructions: Thank you for choosing [facility] to provide your oncology and hematology care.   If you have a lab appointment with the [facility], please go directly to the [facility] and check in at the registration area.   Wear comfortable clothing and clothing appropriate for easy access to any Portacath or PICC line.   We strive to give you quality time with your provider. You may need to reschedule your appointment if you arrive late (15 or more minutes).  Arriving late affects you and other patients whose appointments are after yours.  Also, if you miss three or more appointments without notifying the office, you may be dismissed from the clinic at the provider's discretion.      For prescription refill requests, have your pharmacy contact our office and allow 72 hours for refills to be completed.    Today you received the following chemotherapy and/or immunotherapy agents Bevacizumab-adcd (VEGZELMA), Irinotecan (CAMPTOSAR), Leucovorin & Flourouracil (ADRUCIL).      To help prevent nausea and vomiting after your treatment, we encourage you to take your nausea medication as directed.  BELOW ARE SYMPTOMS THAT SHOULD BE REPORTED IMMEDIATELY: *FEVER GREATER THAN 100.4 F (38 C) OR HIGHER *CHILLS OR SWEATING *NAUSEA AND VOMITING THAT IS NOT CONTROLLED WITH YOUR NAUSEA MEDICATION *UNUSUAL SHORTNESS OF BREATH *UNUSUAL BRUISING OR BLEEDING *URINARY PROBLEMS (pain or burning when urinating, or frequent urination) *BOWEL PROBLEMS (unusual diarrhea, constipation, pain near the anus) TENDERNESS IN MOUTH AND THROAT WITH OR WITHOUT PRESENCE OF ULCERS (sore throat, sores in mouth, or a toothache) UNUSUAL RASH, SWELLING OR PAIN  UNUSUAL VAGINAL DISCHARGE OR ITCHING   Items with * indicate a potential emergency and should be followed up as soon as possible or go to the Emergency Department if any problems should  occur.  Please show the CHEMOTHERAPY ALERT CARD or IMMUNOTHERAPY ALERT CARD at check-in to the Emergency Department and triage nurse.  Should you have questions after your visit or need to cancel or reschedule your appointment, please contact [facility]  Dept: [phone]  and follow the prompts.  Office hours are 8:00 a.m. to 4:30 p.m. Monday - Friday. Please note that voicemails left after 4:00 p.m. may not be returned until the following business day.  We are closed weekends and major holidays. You have access to a nurse at all times for urgent questions. Please call the main number to the clinic Dept: [phone] and follow the prompts.   For any non-urgent questions, you may also contact your provider using MyChart. We now offer e-Visits for anyone 66 and older to request care online for non-urgent symptoms. For details visit [email or web address].   Also download the MyChart app! Go to the app store, search "MyChart", open the app, select [facility], and log in with your MyChart username and password.  Bevacizumab Injection What is this medication? BEVACIZUMAB (be va SIZ yoo mab) treats some types of cancer. It works by blocking a protein that causes cancer cells to grow and multiply. This helps to slow or stop the spread of cancer cells. It is a monoclonal antibody. This medicine may be used for other purposes; ask your health care provider or pharmacist if you have questions. COMMON BRAND NAME(S): Alymsys, Avastin, MVASI, Noah Charon What should I tell my care team before I take this medication? They need to know if you have any of these conditions: Blood clots Coughing up  blood Having or recent surgery Heart failure High blood pressure History of a connection between 2 or more body parts that do not usually connect (fistula) History of a tear in your stomach or intestines Protein in your urine An unusual or allergic reaction to bevacizumab,  other medications, foods, dyes, or preservatives Pregnant or trying to get pregnant Breast-feeding How should I use this medication? This medication is injected into a vein. It is given by your care team in a hospital or clinic setting. Talk to your care team the use of this medication in children. Special care may be needed. Overdosage: If you think you have taken too much of this medicine contact a poison control center or emergency room at once. NOTE: This medicine is only for you. Do not share this medicine with others. What if I miss a dose? Keep appointments for follow-up doses. It is important not to miss your dose. Call your care team if you are unable to keep an appointment. What may interact with this medication? Interactions are not expected. This list may not describe all possible interactions. Give your health care provider a list of all the medicines, herbs, non-prescription drugs, or dietary supplements you use. Also tell them if you smoke, drink alcohol, or use illegal drugs. Some items may interact with your medicine. What should I watch for while using this medication? Your condition will be monitored carefully while you are receiving this medication. You may need blood work while taking this medication. This medication may make you feel generally unwell. This is not uncommon as chemotherapy can affect healthy cells as well as cancer cells. Report any side effects. Continue your course of treatment even though you feel ill unless your care team tells you to stop. This medication may increase your risk to bruise or bleed. Call your care team if you notice any unusual bleeding. Before having surgery, talk to your care team to make sure it is ok. This medication can increase the risk of poor healing of your surgical site or wound. You will need to stop this medication for 28 days before surgery. After surgery, wait at least 28 days before restarting this medication. Make sure the  surgical site or wound is healed enough before restarting this medication. Talk to your care team if questions. Talk to your care team if you may be pregnant. Serious birth defects can occur if you take this medication during pregnancy and for 6 months after the last dose. Contraception is recommended while taking this medication and for 6 months after the last dose. Your care team can help you find the option that works for you. Do not breastfeed while taking this medication and for 6 months after the last dose. This medication can cause infertility. Talk to your care team if you are concerned about your fertility. What side effects may I notice from receiving this medication? Side effects that you should report to your care team as soon as possible: Allergic reactions--skin rash, itching, hives, swelling of the face, lips, tongue, or throat Bleeding--bloody or black, tar-like stools, vomiting blood or brown material that looks like coffee grounds, red or dark brown urine, small red or purple spots on skin, unusual bruising or bleeding Blood clot--pain, swelling, or warmth in the leg, shortness of breath, chest pain Heart attack--pain or tightness in the chest, shoulders, arms, or jaw, nausea, shortness of breath, cold or clammy skin, feeling faint or lightheaded Heart failure--shortness of breath, swelling of the ankles, feet, or hands,  sudden weight gain, unusual weakness or fatigue Increase in blood pressure Infection--fever, chills, cough, sore throat, wounds that don't heal, pain or trouble when passing urine, general feeling of discomfort or being unwell Infusion reactions--chest pain, shortness of breath or trouble breathing, feeling faint or lightheaded Kidney injury--decrease in the amount of urine, swelling of the ankles, hands, or feet Stomach pain that is severe, does not go away, or gets worse Stroke--sudden numbness or weakness of the face, arm, or leg, trouble speaking, confusion,  trouble walking, loss of balance or coordination, dizziness, severe headache, change in vision Sudden and severe headache, confusion, change in vision, seizures, which may be signs of posterior reversible encephalopathy syndrome (PRES) Side effects that usually do not require medical attention (report to your care team if they continue or are bothersome): Back pain Change in taste Diarrhea Dry skin Increased tears Nosebleed This list may not describe all possible side effects. Call your doctor for medical advice about side effects. You may report side effects to FDA at 1-800-FDA-1088. Where should I keep my medication? This medication is given in a hospital or clinic. It will not be stored at home. NOTE: This sheet is a summary. It may not cover all possible information. If you have questions about this medicine, talk to your doctor, pharmacist, or health care provider.  2023 Elsevier/Gold Standard (2022-01-11 00:00:00)  Irinotecan Injection What is this medication? IRINOTECAN (ir in oh TEE kan) treats some types of cancer. It works by slowing down the growth of cancer cells. This medicine may be used for other purposes; ask your health care provider or pharmacist if you have questions. COMMON BRAND NAME(S): Camptosar What should I tell my care team before I take this medication? They need to know if you have any of these conditions: Dehydration Diarrhea Infection, especially a viral infection, such as chickenpox, cold sores, herpes Liver disease Low blood cell levels (white cells, red cells, and platelets) Low levels of electrolytes, such as calcium, magnesium, or potassium in your blood Recent or ongoing radiation An unusual or allergic reaction to irinotecan, other medications, foods, dyes, or preservatives If you or your partner are pregnant or trying to get pregnant Breast-feeding How should I use this medication? This medication is injected into a vein. It is given by your  care team in a hospital or clinic setting. Talk to your care team about the use of this medication in children. Special care may be needed. Overdosage: If you think you have taken too much of this medicine contact a poison control center or emergency room at once. NOTE: This medicine is only for you. Do not share this medicine with others. What if I miss a dose? Keep appointments for follow-up doses. It is important not to miss your dose. Call your care team if you are unable to keep an appointment. What may interact with this medication? Do not take this medication with any of the following: Cobicistat Itraconazole This medication may also interact with the following: Certain antibiotics, such as clarithromycin, rifampin, rifabutin Certain antivirals for HIV or AIDS Certain medications for fungal infections, such as ketoconazole, posaconazole, voriconazole Certain medications for seizures, such as carbamazepine, phenobarbital, phenytoin Gemfibrozil Nefazodone St. John's wort This list may not describe all possible interactions. Give your health care provider a list of all the medicines, herbs, non-prescription drugs, or dietary supplements you use. Also tell them if you smoke, drink alcohol, or use illegal drugs. Some items may interact with your medicine. What should I watch  for while using this medication? Your condition will be monitored carefully while you are receiving this medication. You may need blood work while taking this medication. This medication may make you feel generally unwell. This is not uncommon as chemotherapy can affect healthy cells as well as cancer cells. Report any side effects. Continue your course of treatment even though you feel ill unless your care team tells you to stop. This medication can cause serious side effects. To reduce the risk, your care team may give you other medications to take before receiving this one. Be sure to follow the directions from your  care team. This medication may affect your coordination, reaction time, or judgement. Do not drive or operate machinery until you know how this medication affects you. Sit up or stand slowly to reduce the risk of dizzy or fainting spells. Drinking alcohol with this medication can increase the risk of these side effects. This medication may increase your risk of getting an infection. Call your care team for advice if you get a fever, chills, sore throat, or other symptoms of a cold or flu. Do not treat yourself. Try to avoid being around people who are sick. Avoid taking medications that contain aspirin, acetaminophen, ibuprofen, naproxen, or ketoprofen unless instructed by your care team. These medications may hide a fever. This medication may increase your risk to bruise or bleed. Call your care team if you notice any unusual bleeding. Be careful brushing or flossing your teeth or using a toothpick because you may get an infection or bleed more easily. If you have any dental work done, tell your dentist you are receiving this medication. Talk to your care team if you or your partner are pregnant or think either of you might be pregnant. This medication can cause serious birth defects if taken during pregnancy and for 6 months after the last dose. You will need a negative pregnancy test before starting this medication. Contraception is recommended while taking this medication and for 6 months after the last dose. Your care team can help you find the option that works for you. Do not father a child while taking this medication and for 3 months after the last dose. Use a condom for contraception during this time period. Do not breastfeed while taking this medication and for 7 days after the last dose. This medication may cause infertility. Talk to your care team if you are concerned about your fertility. What side effects may I notice from receiving this medication? Side effects that you should report to  your care team as soon as possible: Allergic reactions--skin rash, itching, hives, swelling of the face, lips, tongue, or throat Dry cough, shortness of breath or trouble breathing Increased saliva or tears, increased sweating, stomach cramping, diarrhea, small pupils, unusual weakness or fatigue, slow heartbeat Infection--fever, chills, cough, sore throat, wounds that don't heal, pain or trouble when passing urine, general feeling of discomfort or being unwell Kidney injury--decrease in the amount of urine, swelling of the ankles, hands, or feet Low red blood cell level--unusual weakness or fatigue, dizziness, headache, trouble breathing Severe or prolonged diarrhea Unusual bruising or bleeding Side effects that usually do not require medical attention (report to your care team if they continue or are bothersome): Constipation Diarrhea Hair loss Loss of appetite Nausea Stomach pain This list may not describe all possible side effects. Call your doctor for medical advice about side effects. You may report side effects to FDA at 1-800-FDA-1088. Where should I keep my medication?  This medication is given in a hospital or clinic. It will not be stored at home. NOTE: This sheet is a summary. It may not cover all possible information. If you have questions about this medicine, talk to your doctor, pharmacist, or health care provider.  2023 Elsevier/Gold Standard (2022-01-17 00:00:00)  Leucovorin Injection What is this medication? LEUCOVORIN (loo koe VOR in) prevents side effects from certain medications, such as methotrexate. It works by increasing folate levels. This helps protect healthy cells in your body. It may also be used to treat anemia caused by low levels of folate. It can also be used with fluorouracil, a type of chemotherapy, to treat colorectal cancer. It works by increasing the effects of fluorouracil in the body. This medicine may be used for other purposes; ask your health care  provider or pharmacist if you have questions. What should I tell my care team before I take this medication? They need to know if you have any of these conditions: Anemia from low levels of vitamin B12 in the blood An unusual or allergic reaction to leucovorin, folic acid, other medications, foods, dyes, or preservatives Pregnant or trying to get pregnant Breastfeeding How should I use this medication? This medication is injected into a vein or a muscle. It is given by your care team in a hospital or clinic setting. Talk to your care team about the use of this medication in children. Special care may be needed. Overdosage: If you think you have taken too much of this medicine contact a poison control center or emergency room at once. NOTE: This medicine is only for you. Do not share this medicine with others. What if I miss a dose? Keep appointments for follow-up doses. It is important not to miss your dose. Call your care team if you are unable to keep an appointment. What may interact with this medication? Capecitabine Fluorouracil Phenobarbital Phenytoin Primidone Trimethoprim;sulfamethoxazole This list may not describe all possible interactions. Give your health care provider a list of all the medicines, herbs, non-prescription drugs, or dietary supplements you use. Also tell them if you smoke, drink alcohol, or use illegal drugs. Some items may interact with your medicine. What should I watch for while using this medication? Your condition will be monitored carefully while you are receiving this medication. This medication may increase the side effects of 5-fluorouracil. Tell your care team if you have diarrhea or mouth sores that do not get better or that get worse. What side effects may I notice from receiving this medication? Side effects that you should report to your care team as soon as possible: Allergic reactions--skin rash, itching, hives, swelling of the face, lips, tongue,  or throat This list may not describe all possible side effects. Call your doctor for medical advice about side effects. You may report side effects to FDA at 1-800-FDA-1088. Where should I keep my medication? This medication is given in a hospital or clinic. It will not be stored at home. NOTE: This sheet is a summary. It may not cover all possible information. If you have questions about this medicine, talk to your doctor, pharmacist, or health care provider.  2023 Elsevier/Gold Standard (2022-01-18 00:00:00)  Fluorouracil Injection What is this medication? FLUOROURACIL (flure oh YOOR a sil) treats some types of cancer. It works by slowing down the growth of cancer cells. This medicine may be used for other purposes; ask your health care provider or pharmacist if you have questions. COMMON BRAND NAME(S): Adrucil What should I tell  my care team before I take this medication? They need to know if you have any of these conditions: Blood disorders Dihydropyrimidine dehydrogenase (DPD) deficiency Infection, such as chickenpox, cold sores, herpes Kidney disease Liver disease Poor nutrition Recent or ongoing radiation therapy An unusual or allergic reaction to fluorouracil, other medications, foods, dyes, or preservatives If you or your partner are pregnant or trying to get pregnant Breast-feeding How should I use this medication? This medication is injected into a vein. It is administered by your care team in a hospital or clinic setting. Talk to your care team about the use of this medication in children. Special care may be needed. Overdosage: If you think you have taken too much of this medicine contact a poison control center or emergency room at once. NOTE: This medicine is only for you. Do not share this medicine with others. What if I miss a dose? Keep appointments for follow-up doses. It is important not to miss your dose. Call your care team if you are unable to keep an  appointment. What may interact with this medication? Do not take this medication with any of the following: Live virus vaccines This medication may also interact with the following: Medications that treat or prevent blood clots, such as warfarin, enoxaparin, dalteparin This list may not describe all possible interactions. Give your health care provider a list of all the medicines, herbs, non-prescription drugs, or dietary supplements you use. Also tell them if you smoke, drink alcohol, or use illegal drugs. Some items may interact with your medicine. What should I watch for while using this medication? Your condition will be monitored carefully while you are receiving this medication. This medication may make you feel generally unwell. This is not uncommon as chemotherapy can affect healthy cells as well as cancer cells. Report any side effects. Continue your course of treatment even though you feel ill unless your care team tells you to stop. In some cases, you may be given additional medications to help with side effects. Follow all directions for their use. This medication may increase your risk of getting an infection. Call your care team for advice if you get a fever, chills, sore throat, or other symptoms of a cold or flu. Do not treat yourself. Try to avoid being around people who are sick. This medication may increase your risk to bruise or bleed. Call your care team if you notice any unusual bleeding. Be careful brushing or flossing your teeth or using a toothpick because you may get an infection or bleed more easily. If you have any dental work done, tell your dentist you are receiving this medication. Avoid taking medications that contain aspirin, acetaminophen, ibuprofen, naproxen, or ketoprofen unless instructed by your care team. These medications may hide a fever. Do not treat diarrhea with over the counter products. Contact your care team if you have diarrhea that lasts more than 2  days or if it is severe and watery. This medication can make you more sensitive to the sun. Keep out of the sun. If you cannot avoid being in the sun, wear protective clothing and sunscreen. Do not use sun lamps, tanning beds, or tanning booths. Talk to your care team if you or your partner wish to become pregnant or think you might be pregnant. This medication can cause serious birth defects if taken during pregnancy and for 3 months after the last dose. A reliable form of contraception is recommended while taking this medication and for 3 months after  the last dose. Talk to your care team about effective forms of contraception. Do not father a child while taking this medication and for 3 months after the last dose. Use a condom while having sex during this time period. Do not breastfeed while taking this medication. This medication may cause infertility. Talk to your care team if you are concerned about your fertility. What side effects may I notice from receiving this medication? Side effects that you should report to your care team as soon as possible: Allergic reactions--skin rash, itching, hives, swelling of the face, lips, tongue, or throat Heart attack--pain or tightness in the chest, shoulders, arms, or jaw, nausea, shortness of breath, cold or clammy skin, feeling faint or lightheaded Heart failure--shortness of breath, swelling of the ankles, feet, or hands, sudden weight gain, unusual weakness or fatigue Heart rhythm changes--fast or irregular heartbeat, dizziness, feeling faint or lightheaded, chest pain, trouble breathing High ammonia level--unusual weakness or fatigue, confusion, loss of appetite, nausea, vomiting, seizures Infection--fever, chills, cough, sore throat, wounds that don't heal, pain or trouble when passing urine, general feeling of discomfort or being unwell Low red blood cell level--unusual weakness or fatigue, dizziness, headache, trouble breathing Pain, tingling, or  numbness in the hands or feet, muscle weakness, change in vision, confusion or trouble speaking, loss of balance or coordination, trouble walking, seizures Redness, swelling, and blistering of the skin over hands and feet Severe or prolonged diarrhea Unusual bruising or bleeding Side effects that usually do not require medical attention (report to your care team if they continue or are bothersome): Dry skin Headache Increased tears Nausea Pain, redness, or swelling with sores inside the mouth or throat Sensitivity to light Vomiting This list may not describe all possible side effects. Call your doctor for medical advice about side effects. You may report side effects to FDA at 1-800-FDA-1088. Where should I keep my medication? This medication is given in a hospital or clinic. It will not be stored at home. NOTE: This sheet is a summary. It may not cover all possible information. If you have questions about this medicine, talk to your doctor, pharmacist, or health care provider.  2023 Elsevier/Gold Standard (2022-01-08 00:00:00)  The chemotherapy medication bag should finish at 46 hours, 96 hours, or 7 days. For example, if your pump is scheduled for 46 hours and it was put on at 4:00 p.m., it should finish at 2:00 p.m. the day it is scheduled to come off regardless of your appointment time.     Estimated time to finish at 12:30 p.m. on Thursday 12/19/2022.   If the display on your pump reads "Low Volume" and it is beeping, take the batteries out of the pump and come to the cancer center for it to be taken off.   If the pump alarms go off prior to the pump reading "Low Volume" then call [phone] and someone can assist you.  If the plunger comes out and the chemotherapy medication is leaking out, please use your home chemo spill kit to clean up the spill. Do NOT use paper towels or other household products.  If you have problems or questions regarding your pump, please call either  [phone] (24 hours a day) or the cancer center Monday-Friday 8:00 a.m.- 4:30 p.m. at the clinic number and we will assist you. If you are unable to get assistance, then go to the nearest Emergency Department and ask the staff to contact the IV team for assistance.

## 2022-12-19 ENCOUNTER — Inpatient Hospital Stay: Payer: Medicare Other

## 2022-12-19 VITALS — BP 139/71 | HR 85 | Temp 98.2°F | Resp 18

## 2022-12-19 DIAGNOSIS — C20 Malignant neoplasm of rectum: Secondary | ICD-10-CM

## 2022-12-19 DIAGNOSIS — Z95828 Presence of other vascular implants and grafts: Secondary | ICD-10-CM

## 2022-12-19 DIAGNOSIS — Z5111 Encounter for antineoplastic chemotherapy: Secondary | ICD-10-CM | POA: Diagnosis not present

## 2022-12-19 MED ORDER — SODIUM CHLORIDE 0.9% FLUSH
10.0000 mL | INTRAVENOUS | Status: DC | PRN
  Administered 2022-12-19: 10 mL via INTRAVENOUS

## 2022-12-19 MED ORDER — HEPARIN SOD (PORK) LOCK FLUSH 100 UNIT/ML IV SOLN
500.0000 [IU] | Freq: Once | INTRAVENOUS | Status: AC
  Administered 2022-12-19: 500 [IU] via INTRAVENOUS

## 2022-12-19 NOTE — Patient Instructions (Signed)

## 2022-12-24 ENCOUNTER — Other Ambulatory Visit: Payer: Self-pay

## 2022-12-25 ENCOUNTER — Inpatient Hospital Stay: Payer: Medicare Other | Attending: Nurse Practitioner | Admitting: Nutrition

## 2022-12-25 ENCOUNTER — Telehealth: Payer: Self-pay | Admitting: *Deleted

## 2022-12-25 DIAGNOSIS — R970 Elevated carcinoembryonic antigen [CEA]: Secondary | ICD-10-CM | POA: Insufficient documentation

## 2022-12-25 DIAGNOSIS — G4700 Insomnia, unspecified: Secondary | ICD-10-CM | POA: Insufficient documentation

## 2022-12-25 DIAGNOSIS — C787 Secondary malignant neoplasm of liver and intrahepatic bile duct: Secondary | ICD-10-CM | POA: Insufficient documentation

## 2022-12-25 DIAGNOSIS — C20 Malignant neoplasm of rectum: Secondary | ICD-10-CM | POA: Insufficient documentation

## 2022-12-25 DIAGNOSIS — Z5111 Encounter for antineoplastic chemotherapy: Secondary | ICD-10-CM | POA: Insufficient documentation

## 2022-12-25 MED ORDER — TRAZODONE HCL 50 MG PO TABS: 50.0000 mg | ORAL_TABLET | Freq: Every day | ORAL | 0 refills | Status: DC

## 2022-12-25 NOTE — Progress Notes (Signed)
Patient noted to have continuing and ongoing weight loss. Last weight documented was 140 pounds 3.2 oz on March 26. This is decreased from 152 pounds on November 29. This is 8% over ~4 months. Attempted to call patient at both home number and mobile number. I was unable to leave a message. VM is full.

## 2022-12-25 NOTE — Telephone Encounter (Signed)
Neil Perry is asking for prescription sleep aid--has taken Benadryl, Melatonin and of recent Unisom. He will go to sleep and wake up in 2 hours and then not able to go back to sleep to early hours in morning.  Pharmacy is [facility]. Per Dr. [name]: OK to try Trazodone 50 mg at night. Patient notified.

## 2023-01-02 ENCOUNTER — Ambulatory Visit (HOSPITAL_BASED_OUTPATIENT_CLINIC_OR_DEPARTMENT_OTHER)
Admission: RE | Admit: 2023-01-02 | Discharge: 2023-01-02 | Disposition: A | Discharge status: Home / Self Care | Payer: Medicare Other | Source: Ambulatory Visit | Attending: Oncology | Admitting: Oncology

## 2023-01-02 ENCOUNTER — Encounter (HOSPITAL_BASED_OUTPATIENT_CLINIC_OR_DEPARTMENT_OTHER): Payer: Self-pay

## 2023-01-02 DIAGNOSIS — C20 Malignant neoplasm of rectum: Secondary | ICD-10-CM | POA: Insufficient documentation

## 2023-01-02 MED ORDER — IOHEXOL 300 MG/ML  SOLN
100.0000 mL | Freq: Once | INTRAMUSCULAR | Status: AC | PRN
  Administered 2023-01-02: 85 mL via INTRAVENOUS

## 2023-01-05 ENCOUNTER — Other Ambulatory Visit: Payer: Self-pay | Admitting: Oncology

## 2023-01-07 ENCOUNTER — Encounter: Payer: Self-pay | Admitting: *Deleted

## 2023-01-07 ENCOUNTER — Inpatient Hospital Stay: Payer: Medicare Other

## 2023-01-07 ENCOUNTER — Inpatient Hospital Stay (HOSPITAL_BASED_OUTPATIENT_CLINIC_OR_DEPARTMENT_OTHER): Payer: Medicare Other | Admitting: Oncology

## 2023-01-07 ENCOUNTER — Encounter: Payer: Self-pay | Admitting: Oncology

## 2023-01-07 VITALS — BP 142/81 | HR 78 | Temp 98.1°F | Resp 20 | Ht 69.0 in | Wt 142.6 lb

## 2023-01-07 DIAGNOSIS — C20 Malignant neoplasm of rectum: Secondary | ICD-10-CM

## 2023-01-07 DIAGNOSIS — R970 Elevated carcinoembryonic antigen [CEA]: Secondary | ICD-10-CM | POA: Diagnosis not present

## 2023-01-07 DIAGNOSIS — G4700 Insomnia, unspecified: Secondary | ICD-10-CM | POA: Diagnosis not present

## 2023-01-07 DIAGNOSIS — Z95828 Presence of other vascular implants and grafts: Secondary | ICD-10-CM

## 2023-01-07 DIAGNOSIS — C787 Secondary malignant neoplasm of liver and intrahepatic bile duct: Secondary | ICD-10-CM | POA: Diagnosis not present

## 2023-01-07 DIAGNOSIS — Z5111 Encounter for antineoplastic chemotherapy: Secondary | ICD-10-CM | POA: Diagnosis present

## 2023-01-07 LAB — CMP (CANCER CENTER ONLY)
ALT: 13 U/L (ref 0–44)
AST: 13 U/L — ABNORMAL LOW (ref 15–41)
Albumin: 4 g/dL (ref 3.5–5.0)
Alkaline Phosphatase: 61 U/L (ref 38–126)
Anion gap: 9 (ref 5–15)
BUN: 14 mg/dL (ref 8–23)
CO2: 25 mmol/L (ref 22–32)
Calcium: 9.5 mg/dL (ref 8.9–10.3)
Chloride: 107 mmol/L (ref 98–111)
Creatinine: 0.95 mg/dL (ref 0.61–1.24)
GFR, Estimated: >60 mL/min (ref >60)
Glucose, Bld: 124 mg/dL — ABNORMAL HIGH (ref 70–99)
Potassium: 3.9 mmol/L (ref 3.5–5.1)
Sodium: 141 mmol/L (ref 135–145)
Total Bilirubin: 0.6 mg/dL (ref 0.3–1.2)
Total Protein: 5.6 g/dL — ABNORMAL LOW (ref 6.5–8.1)

## 2023-01-07 LAB — CBC WITH DIFFERENTIAL (CANCER CENTER ONLY)
Abs Immature Granulocytes: 0 10*3/uL (ref 0.00–0.07)
Basophils Absolute: 0 10*3/uL (ref 0.0–0.1)
Basophils Relative: 0 %
Eosinophils Absolute: 0.1 10*3/uL (ref 0.0–0.5)
Eosinophils Relative: 3 %
HCT: 40.4 % (ref 39.0–52.0)
Hemoglobin: 13.4 g/dL (ref 13.0–17.0)
Immature Granulocytes: 0 %
Lymphocytes Relative: 11 %
Lymphs Abs: 0.5 10*3/uL — ABNORMAL LOW (ref 0.7–4.0)
MCH: 28.1 pg (ref 26.0–34.0)
MCHC: 33.2 g/dL (ref 30.0–36.0)
MCV: 84.7 fL (ref 80.0–100.0)
Monocytes Absolute: 0.3 10*3/uL (ref 0.1–1.0)
Monocytes Relative: 8 %
Neutro Abs: 3.2 10*3/uL (ref 1.7–7.7)
Neutrophils Relative %: 78 %
Platelet Count: 273 10*3/uL (ref 150–400)
RBC: 4.77 MIL/uL (ref 4.22–5.81)
RDW: 16.6 % — ABNORMAL HIGH (ref 11.5–15.5)
WBC Count: 4.1 10*3/uL (ref 4.0–10.5)
nRBC: 0 % (ref 0.0–0.2)

## 2023-01-07 LAB — CEA (ACCESS): CEA (CHCC): 128.29 ng/mL — ABNORMAL HIGH (ref 0.00–5.00)

## 2023-01-07 LAB — TOTAL PROTEIN, URINE DIPSTICK: Protein, ur: NEGATIVE mg/dL

## 2023-01-07 MED ORDER — SODIUM CHLORIDE 0.9% FLUSH
10.0000 mL | INTRAVENOUS | Status: DC | PRN
  Administered 2023-01-07: 10 mL via INTRAVENOUS

## 2023-01-07 MED ORDER — HEPARIN SOD (PORK) LOCK FLUSH 100 UNIT/ML IV SOLN
500.0000 [IU] | Freq: Once | INTRAVENOUS | Status: AC
  Administered 2023-01-07: 500 [IU] via INTRAVENOUS

## 2023-01-07 MED ORDER — LORAZEPAM 0.5 MG PO TABS: 0.5000 mg | ORAL_TABLET | Freq: Every day | ORAL | 0 refills | Status: DC

## 2023-01-07 NOTE — Progress Notes (Signed)
[facility] OFFICE PROGRESS NOTE   Diagnosis: Rectal cancer  INTERVAL HISTORY:   Mr. Nawrot completed another cycle of FOLFIRI/bevacizumab on 12/17/2022.  No mouth sores, nausea, or diarrhea.  He continues to have hemorrhoid discomfort.  His chief complaint is insomnia.  He has tried multiple over-the-counter medications without relief.  Trazodone did not help.  Objective:  Vital signs in last 24 hours:  Blood pressure (!) 142/81, pulse 78, temperature 98.1 F (36.7 C), temperature source Oral, resp. rate 20, height  (1.753 m), weight 142 lb 9.6 oz (64.7 kg), SpO2 100 %.    HEENT: No thrush or ulcers Resp: Lungs clear bilaterally Cardio: Regular rate and rhythm GI: No hepatosplenomegaly Vascular: No leg edema  Portacath/PICC-without erythema  Lab Results:  Lab Results  Component Value Date   WBC 4.1 01/07/2023   HGB 13.4 01/07/2023   HCT 40.4 01/07/2023   MCV 84.7 01/07/2023   PLT 273 01/07/2023   NEUTROABS 3.2 01/07/2023    CMP  Lab Results  Component Value Date   NA 141 01/07/2023   K 3.9 01/07/2023   CL 107 01/07/2023   CO2 25 01/07/2023   GLUCOSE 124 (H) 01/07/2023   BUN 14 01/07/2023   CREATININE 0.95 01/07/2023   CALCIUM 9.5 01/07/2023   PROT 5.6 (L) 01/07/2023   ALBUMIN 4.0 01/07/2023   AST 13 (L) 01/07/2023   ALT 13 01/07/2023   ALKPHOS 61 01/07/2023   BILITOT 0.6 01/07/2023   GFRNONAA >60 01/07/2023    Lab Results  Component Value Date   CEA 130.51 (H) 12/17/2022     Medications: I have reviewed the patient's current medications.   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; foundation  1-microsatellite stable, tumor mutation burden 5, K-rasG 12V, NRAS wildtype Colonoscopy 05/22/2021-ulcerated partially obstructing mass at 15 cm from anal verge CTs 05/30/2021-numerous small pulmonary nodules concerning for metastases, thickening of the rectum, multiple liver metastases Cycle 1 FOLFOX 06/12/2021 Cycle 2 FOLFOX 06/26/2021 Cycle 3 FOLFOX 07/10/2021, oxaliplatin dose reduced due to progressive decline in the ANC and platelet count Cycle 4 FOLFOX 07/24/2021 Cycle 5 FOLFOX 08/07/2021, 5-FU bolus eliminated and oxaliplatin dose reduced, insurance would not approve Udenyca CTs 08/15/2021-decrease size of lung nodules, decreased hepatic metastases, persistent anorectal wall thickening, stable right renal mass Cycle 6 FOLFOX 08/21/2021, 5-FU bolus and 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 Cycle 7 FOLFOX 09/04/2021, 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 Cycle 8 FOLFOX 09/18/2021, 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 Cycle 9 FOLFOX 10/08/2021, 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 Cycle 10 FOLFOX 10/23/2021, 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 CTs 11/01/2021-decrease in size of occasional small pulmonary nodules, continued decrease in size of multiple hypoenhancing hepatic metastases, unchanged posttreatment appearance of the low rectum, unchanged exophytic mass of the inferior pole of the right kidney measuring 1.5 x 1.3 cm Cycle 11 5-fluorouracil 11/06/2021 Cycle 12 5-fluorouracil 11/20/2021 Cycle 13 5-fluorouracil 12/04/2021 Maintenance Xeloda beginning 12/19/2021 Xeloda dose reduced to 1000 mg twice daily beginning 12/27/2021 Xeloda placed on hold 01/16/2022 CTs 01/22/2022-segment of distal ileum with circumferential wall thickening and perienteric inflammation, decrease in size of liver metastases, stable tiny bilateral pulmonary nodules, circumferential wall thickening in the rectum, stable lower pole right kidney lesion  suspicious for renal cell carcinoma  Xeloda discontinued due to diarrhea 5-fluorouracil pump 02/05/2022 5-fluorouracil pump 02/26/2022 Treatment held 03/18/2022 due to diarrhea 5-fluorouracil pump 03/25/2022, dose reduced due to diarrhea 5-fluorouracil pump 04/16/2022 CTs 05/03/2022-unchanged tiny pulmonary nodules, unchanged liver metastases, stable appearance of the low rectal wall thickening, unchanged posterior right renal mass 5-fluorouracil pump 05/07/2022 5-fluorouracil pump 05/28/2022 5-fluorouracil pump 06/18/2022 CTs 07/04/2022-increased size of liver metastases, no new lesions, new 3 mm right lower lobe nodule-nonspecific Cycle 1 FOLFIRI/bevacizumab 07/16/2022 Cycle 2 FOLFIRI/bevacizumab 08/05/2022; 5-FU bolus eliminated, irinotecan dose reduced due to diarrhea and weight loss Cycle 3 FOLFIRI/bevacizumab 08/21/2022 Cycle 4 FOLFIRI/bevacizumab 09/03/2022 Cycle 5 FOLFIRI/bevacizumab 09/17/2022 CTs 09/26/2022-stable rectal thickening, stable hepatic metastases (dominant lesion appears smaller by my review), small indeterminate pulmonary nodules, unchanged exophytic enhancing lesion at the lower pole of the right kidney Cycle 6 FOLFIRI/bevacizumab 10/01/2022 Cycle 7 FOLFIRI/bevacizumab 10/15/2022, irinotecan dose reduced due to diarrhea Cycle 8 FOLFIRI/bevacizumab 11/05/2022 Cycle 9 FOLFIRI/bevacizumab 11/26/2022 Cycle 10 FOLFIRI/bevacizumab 12/17/2022 CTs 01/02/2023 increased size and several liver lesion stable rectal wall, stable tiny lung nodules Prostate cancer-simple prostatectomy January 2019, Gleason 3+3, 10 to 12% of specimen Active surveillance, biopsy May 2019 with small focus of Gleason 3+3 disease in 1/6 cores, surveillance continued Elevated PSA 04/20/2020 Biopsy 06/19/2020 8/12 cores positive, Gleason 4+4 PET scan 07/07/2020-negative CT 07/07/2020 right iliac and retrocaval adenopathy, 1.3 cm subcapsular right lower pole renal lesion Androgen deprivation therapy beginning  08/21/2020 Radiation to prostate, seminal vesicles, and pelvic lymph nodes to 10/22 - 12/28/2020 He continues every 27-month Firmagon and daily Xtandi 08/21/2022 patient reports Xtandi discontinued   3.  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  Mitral valve prolapse 5.  Family history of prostate cancer 6.  08/15/2021 with leg weakness and an episode of slurred speech-TIA?,  Brain imaging negative 7.  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  I reviewed the restaging CT findings and images with him.  There is radiologic evidence of disease ration.  The CEA has been higher over the past 2 months.  I recommend discontinuing FOLFIRI/bevacizumab. We discussed treatment options.  We reviewed repeat treatment with oxaliplatin based therapy.  He last received oxaliplatin in November 2022 and progress on oxaliplatin.  We discussed Lonsurf/bevacizumab.  I recommend Lonsurf/bevacizumab.  We reviewed potential toxicities associated with the chance of nausea, diarrhea logic toxicity.  He agrees to proceed.  The plan is to begin Lonsurf/bevacizumab next week.  He will return for an office visit and bevacizumab in 2 weeks.  He understands the goals of systemic therapy are to improve symptoms and extend survival.  He will begin a trial of lorazepam for insomnia.  Thornton Papas, MD  01/07/2023  11:15 AM

## 2023-01-07 NOTE — Progress Notes (Signed)
DISCONTINUE ON PATHWAY REGIMEN - Colorectal     A cycle is every 14 days:     Bevacizumab-xxxx      Irinotecan      Leucovorin      Fluorouracil      Fluorouracil   **Always confirm dose/schedule in your pharmacy ordering system**  REASON: Disease Progression PRIOR TREATMENT: [id number]: FOLFIRI + Bevacizumab q14 Days TREATMENT RESPONSE: Stable Disease (SD)  START ON PATHWAY REGIMEN - Colorectal     A cycle is every 28 days:     Trifluridine and tipiracil      Bevacizumab-xxxx   **Always confirm dose/schedule in your pharmacy ordering system**  Patient Characteristics: Distant Metastases, Nonsurgical Candidate, Non-KRAS G12C, RAS Mutation Positive/Unknown (BRAF V600 Wild-Type/Unknown), Standard Cytotoxic Therapy, Third Line Standard Cytotoxic Therapy Tumor Location: Rectal Therapeutic Status: Distant Metastases Microsatellite/Mismatch Repair Status: MSS/pMMR BRAF Mutation Status: Wild-Type (no mutation) KRAS/NRAS Mutation Status: Non-KRAS G12C, RAS Mutation Positive Preferred Therapy Approach: Standard Cytotoxic Therapy Standard Cytotoxic Line of Therapy: Third Careers adviser Cytotoxic Therapy Intent of Therapy: Non-Curative / Palliative Intent, Discussed with Patient

## 2023-01-07 NOTE — Progress Notes (Signed)
Patient seen by Dr. Truett Perna today  Vitals are within treatment parameters.No intervention for BP 142/81  Labs reviewed by Dr. Truett Perna and are within treatment parameters.  Per physician team, patient will not be receiving treatment today. Progression on scan with treatment change

## 2023-01-08 ENCOUNTER — Other Ambulatory Visit: Payer: Self-pay

## 2023-01-08 ENCOUNTER — Other Ambulatory Visit (HOSPITAL_COMMUNITY): Payer: Self-pay

## 2023-01-08 ENCOUNTER — Telehealth: Payer: Self-pay

## 2023-01-08 ENCOUNTER — Other Ambulatory Visit: Payer: Self-pay | Admitting: Oncology

## 2023-01-08 ENCOUNTER — Telehealth: Payer: Self-pay | Admitting: Pharmacist

## 2023-01-08 DIAGNOSIS — C20 Malignant neoplasm of rectum: Secondary | ICD-10-CM

## 2023-01-08 MED ORDER — LONSURF 20-8.19 MG PO TABS
ORAL_TABLET | ORAL | 0 refills | Status: DC
  Filled 2023-01-08: qty 60, fill #0

## 2023-01-08 MED ORDER — LONSURF 20-8.19 MG PO TABS
ORAL_TABLET | ORAL | 0 refills | Status: DC
Start: 2023-01-13 — End: 2023-01-13

## 2023-01-08 NOTE — Telephone Encounter (Signed)
Oral Oncology Patient Advocate Encounter  New authorization   Received notification that prior authorization for Lonsurf is required.   PA submitted on 01/08/23  E-faxed to [employer] at [phone]    Status is pending     Ardeen Fillers, CPhT Oncology Pharmacy Patient Advocate  [facility]  [phone] (phone) [phone] (fax) 01/08/2023 10:34 AM

## 2023-01-08 NOTE — Telephone Encounter (Signed)
Oral Oncology Patient Advocate Encounter  Prior Authorization for Cyndi Lennert has been approved.    PA# [id number]  Effective dates: 01/13/23 through 07/11/23  Patient must fill through [facility].    Ardeen Fillers, CPhT Oncology Pharmacy Patient Advocate  [facility]  [phone] (phone) [phone] (fax) 01/08/2023 12:05 PM

## 2023-01-08 NOTE — Telephone Encounter (Signed)
Oral Oncology Pharmacist Encounter  Received new prescription for Lonsurf (trifluridine/tipiracil) for the treatment of metastatic rectal cancer in conjunction with bevacizumab, planned duration until disease progression or unacceptable drug toxicity.  CMP from 01/07/23 assessed, no relevant lab abnormalities. Prescription dose and frequency assessed.   Current medication list in Epic reviewed, no DDIs with Lonsurf identified.  Evaluated chart and no patient barriers to medication adherence identified.   Prescription has been e-scribed to the [facility] for benefits analysis and approval.  Oral Oncology Clinic will continue to follow for insurance authorization, copayment issues, initial counseling and start date.  Patient agreed to treatment on 01/07/23 per MD documentation.  Remi Haggard, PharmD, BCPS, BCOP, CPP Hematology/Oncology Clinical Pharmacist Practitioner [place]/DB/AP Oral Chemotherapy Navigation Clinic [phone]  01/08/2023 9:18 AM

## 2023-01-09 ENCOUNTER — Inpatient Hospital Stay: Payer: Medicare Other

## 2023-01-09 NOTE — Progress Notes (Signed)
Pharmacist Chemotherapy Monitoring - Initial Assessment    Anticipated start date: 01/14/23   The following has been reviewed per standard work regarding the patient's treatment regimen: The patient's diagnosis, treatment plan and drug doses, and organ/hematologic function Lab orders and baseline tests specific to treatment regimen  The treatment plan start date, drug sequencing, and pre-medications Prior authorization status  Patient's documented medication list, including drug-drug interaction screen and prescriptions for anti-emetics and supportive care specific to the treatment regimen The drug concentrations, fluid compatibility, administration routes, and timing of the medications to be used The patient's access for treatment and lifetime cumulative dose history, if applicable  The patient's medication allergies and previous infusion related reactions, if applicable   Changes made to treatment plan:  N/A  Follow up needed:  N/A   Neil Perry, RPH, 01/09/2023  3:26 PM

## 2023-01-10 ENCOUNTER — Other Ambulatory Visit (HOSPITAL_COMMUNITY): Payer: Self-pay

## 2023-01-10 NOTE — Telephone Encounter (Signed)
Oral Chemotherapy Pharmacist Encounter  Patient Education I spoke with patient for overview of new oral chemotherapy medication: Lonsurf (trifluridine/tipiracil) for the treatment of metastatic rectal cancer in conjunction with bevacizumab, planned duration until disease progression or unacceptable drug toxicity.   Counseled patient on administration, dosing, side effects, monitoring, drug-food interactions, safe handling, storage, and disposal. Patient will take 3 tablets (60 mg of trifluridine) in AM and take 2 tablets (40 mg of trifluridine) in PM. Take within 1 hr after AM & PM meals on days 1-5, 8-12. Repeat every 28 days.   Side effects include but not limited to: diarrhea, decreased wbc/hgb/plt, nausea.    Reviewed with patient importance of keeping a medication schedule and plan for any missed doses.  After discussion with patient no patient barriers to medication adherence identified.   Mr. Milosevic voiced understanding and appreciation. All questions answered. Medication handout provided.  Provided patient with Oral Chemotherapy Navigation Clinic phone number. Patient knows to call the office with questions or concerns. Oral Chemotherapy Navigation Clinic will continue to follow.  Remi Haggard, PharmD, BCPS, BCOP, CPP Hematology/Oncology Clinical Pharmacist Practitioner [place]/DB/AP Oral Chemotherapy Navigation Clinic [phone]  01/10/2023 1:13 PM

## 2023-01-10 NOTE — Telephone Encounter (Signed)
Called [employer] (patient's insurance) to change Prior Auth Approval date from 01/13/23 to 01/10/23. Change was approved, however system may not update until tomorrow, 01/11/23. Called patient to inform them that they should call [facility] in the morning to schedule delivery. Patient did not pick up and VM was full so unable to leave message. I will continue to try and reach patient.    Ardeen Fillers, CPhT Oncology Pharmacy Patient Advocate  [facility]  [phone] (phone) [phone] (fax) 01/10/2023 3:16 PM

## 2023-01-13 ENCOUNTER — Encounter: Payer: Medicare Other | Admitting: Pharmacist

## 2023-01-13 ENCOUNTER — Other Ambulatory Visit: Payer: Self-pay | Admitting: Oncology

## 2023-01-13 ENCOUNTER — Other Ambulatory Visit (HOSPITAL_COMMUNITY): Payer: Self-pay

## 2023-01-13 DIAGNOSIS — C20 Malignant neoplasm of rectum: Secondary | ICD-10-CM

## 2023-01-13 NOTE — Telephone Encounter (Signed)
Called to verify PA was in system. [facility] did receive a paid claim with a co-pay of $200. [facility] Representative informed me they were obtaining co-pay card for patient to bring co-pay to $0 and would be reaching out to patient today, 04/22//24 to set up delivery. I also informed them patient's start date was today and asked for them to expedite as much as possible.    Ardeen Fillers, CPhT Oncology Pharmacy Patient Advocate  [facility]  [phone] (phone) [phone] (fax) 01/13/2023 11:48 AM

## 2023-01-13 NOTE — Progress Notes (Unsigned)
Care Management & Coordination Services Pharmacy Note  01/13/2023 Name:  Neil Perry MRN:  [id number] DOB:  11-30-1940  Summary: ***  Recommendations/Changes made from today's visit: ***  Follow up plan: ***   Subjective: Neil Perry is an 82 y.o. year old male who is a primary patient of Ardith Dark, MD.  The care coordination team was consulted for assistance with disease management and care coordination needs.    Engaged with patient by telephone for follow up visit.  Recent office visits:  None   Recent consult visits:  11/26/2022 OV (Oncology) Rana Snare, NP; no medication changes indicated.   11/05/2022 OV (Oncology) Rana Snare, NP; no medication changes indicated.   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 He would like to delay treatment for 1 week and then changed to an every 3-week treatment schedule.    Hospital visits:  None in previous 6 months   Objective:  Lab Results  Component Value Date   CREATININE 0.95 01/07/2023   BUN 14 01/07/2023   GFRNONAA >60 01/07/2023   NA 141 01/07/2023   K 3.9 01/07/2023   CALCIUM 9.5 01/07/2023   CO2 25 01/07/2023   GLUCOSE 124 (H) 01/07/2023    Lab Results  Component Value Date/Time   HGBA1C 4.4 (L) 08/16/2021 06:15 AM    Last diabetic Eye exam: No results found for: "HMDIABEYEEXA"  Last diabetic Foot exam: No results found for: "HMDIABFOOTEX"   Lab Results  Component Value Date   CHOL 118 08/16/2021   HDL 25 (L) 08/16/2021   LDLCALC 51 08/16/2021   TRIG 208 (H) 08/16/2021   CHOLHDL 4.7 08/16/2021       Latest Ref Rng & Units 01/07/2023   10:20 AM 12/17/2022   10:06 AM 11/26/2022   10:10 AM  Hepatic Function  Total Protein 6.5 - 8.1 g/dL 5.6  6.0  6.3   Albumin 3.5 - 5.0 g/dL 4.0  4.0  4.0   AST 15 - 41 U/L 13  13  12    ALT 0 - 44 U/L 13  11  13    Alk Phosphatase 38 - 126 U/L 61  63  64   Total Bilirubin 0.3 -  1.2 mg/dL 0.6  0.7  0.7     Lab Results  Component Value Date/Time   TSH 3.53 09/27/2021 02:49 PM   FREET4 0.91 09/27/2021 02:49 PM       Latest Ref Rng & Units 01/07/2023   10:20 AM 12/17/2022   10:06 AM 11/26/2022   10:10 AM  CBC  WBC 4.0 - 10.5 K/uL 4.1  5.1  3.9   Hemoglobin 13.0 - 17.0 g/dL 40.9  81.1  91.4   Hematocrit 39.0 - 52.0 % 40.4  40.0  40.1   Platelets 150 - 400 K/uL 273  274  248     Lab Results  Component Value Date/Time   VD25OH 42.04 09/27/2021 02:49 PM   VITAMINB12 787 09/27/2021 02:49 PM    Clinical ASCVD: {YES/NO:21197} The ASCVD Risk score (Arnett DK, et al., 2019) failed to calculate for the following reasons:   The 2019 ASCVD risk score is only valid for ages 68 to 22    ***Other: (CHADS2VASc if Afib, MMRC or CAT for COPD, ACT, DEXA)     04/20/2021    3:36 PM 01/18/2020    2:12 PM 12/16/2019    1:30 PM  Depression screen PHQ 2/9  Decreased Interest 0 0 0  Down, Depressed, Hopeless 0 0 0  PHQ - 2 Score 0 0 0     Social History   Tobacco Use  Smoking Status Never  Smokeless Tobacco Never   BP Readings from Last 3 Encounters:  01/07/23 (!) 142/81  12/19/22 139/71  12/17/22 (!) 150/84   Pulse Readings from Last 3 Encounters:  01/07/23 78  12/19/22 85  12/17/22 73   Wt Readings from Last 3 Encounters:  01/07/23 142 lb 9.6 oz (64.7 kg)  12/17/22 140 lb 3.2 oz (63.6 kg)  11/26/22 146 lb 9.6 oz (66.5 kg)   BMI Readings from Last 3 Encounters:  01/07/23 21.06 kg/m  12/17/22 20.70 kg/m  11/26/22 21.65 kg/m    No Known Allergies  Medications Reviewed Today     Reviewed by Wandalee Ferdinand, RN (Registered Nurse) on 01/07/23 at 1148  Med List Status: <None>   Medication Order Taking? Sig Documenting Provider Last Dose Status Informant  amLODipine (NORVASC) 5 MG tablet [id number] Yes TAKE 1 TABLET BY MOUTH EVERY DAY Ardith Dark, MD Taking Active   aspirin EC 81 MG EC tablet [id number] Yes Take 1 tablet (81 mg total) by mouth  daily. Swallow whole. Osvaldo Shipper, MD Taking Active   diphenoxylate-atropine (LOMOTIL) 2.5-0.025 MG tablet [id number] Yes TAKE 1 to 2 TABLETS BY MOUTH 4 TIMES DAILY AS NEEDED FOR DIARRHEA OR loose stools Ladene Artist, MD Taking Active   lidocaine-prilocaine (EMLA) cream [id number] Yes Apply 1 application topically as directed. Apply 1/2 tablespoon to port site 2 hours prior to stick and cover with Press and Seal to numb site Ladene Artist, MD Taking Active Spouse/Significant Other  loperamide (IMODIUM) 2 MG capsule [id number] No Take 2 mg by mouth as needed for diarrhea or loose stools.  Patient not taking: Reported on 10/29/2022   [provider] Not Taking Active Self           Med Note Rolan Lipa, SUSAN L   Tue Jan 07, 2023 10:39 AM) Not effective--does not take  ondansetron (ZOFRAN) 8 MG tablet [id number] No Take 8 mg by mouth every 8 (eight) hours as needed for nausea or vomiting.  Patient not taking: Reported on 10/15/2022   [provider] Not Taking Active   prochlorperazine (COMPAZINE) 10 MG tablet [id number] No Take 10 mg by mouth every 6 (six) hours as needed for nausea or vomiting.  Patient not taking: Reported on 09/03/2022   [provider] Not Taking Active   Discontinued 01/07/23 1145 (Discontinued by provider)   Med List Note Remi Haggard, RPH-CPP 12/07/21 1014): [facility]eloda filled at [facility]            SDOH:  (Social Determinants of Health) assessments and interventions performed: {yes/no:20286} SDOH Interventions    Flowsheet Row Telephone from 06/17/2022 in [facility] [employer] Care Coordination Social Work from 11/06/2021 in [facility]  SDOH Interventions    Food Insecurity Interventions Intervention Not Indicated Intervention Not Indicated  Housing Interventions Intervention Not Indicated Intervention Not Indicated  Transportation Interventions Intervention Not Indicated  Intervention Not Indicated  Financial Strain Interventions -- Intervention Not Indicated  Physical Activity Interventions -- Intervention Not Indicated  Stress Interventions -- Intervention Not Indicated  Social Connections Interventions -- Intervention Not Indicated       Medication Assistance: {MEDASSISTANCEINFO:25044}  Medication Access: Within the past 30 days, how often has patient missed a dose of medication? *** Is a  pillbox or other method used to improve adherence? {YES/NO:21197} Factors that may affect medication adherence? {CHL DESC; BARRIERS:21522} Are meds synced by current pharmacy? {YES/NO:21197} Are meds delivered by current pharmacy? {YES/NO:21197} Does patient experience delays in picking up medications due to transportation concerns? {YES/NO:21197}  [facility] Reviewed: Is patient disadvantaged to use [facility]?: {YES/NO:21197} Current Rx insurance plan: *** Name and location of Current pharmacy:  [facility] - [place], [place] - [address] [place] [address] Phone: [phone] Fax: [phone]  [facility] - [facility] [address] [place] [address] Phone: [phone] Fax: [phone]  [facility] [place], [place] - [address] [place] [address] Phone: [phone] Fax: [phone]  [facility] [place], [place] - [address] [place] [address] Phone: [phone] Fax: [phone]  [facility] services reviewed with patient today?: {YES/NO:21197} Patient requests to transfer care to [facility]?: {YES/NO:21197} Reason patient declined to change pharmacies: {US patient preference:27474}  Compliance/Adherence/Medication fill history: Care Gaps: ***  Star-Rating Drugs: ***   Assessment/Plan                   Hypertension (BP goal <130/80) -Not  ideally controlled -Current treatment: Amlodipine  Appropriate, Query effective,  -Medications previously tried: HCTZ, losartan  -Current home readings: not currently checking at home -Current dietary habits: consistent diet, does not consume a lot of salt.  Does mention his wife does not think he is currently eating enough -Current exercise habits: minimal due to weakness/shaking in legs -Denies hypotensive/hypertensive symptoms -Educated on BP goals and benefits of medications for prevention of heart attack, stroke and kidney damage; Daily salt intake goal < 2300 mg; Importance of home blood pressure monitoring; Symptoms of hypotension and importance of maintaining adequate hydration; -Counseled to monitor BP at home a few times per week, document, and provide log at future appointments -Recommended to continue current medication Last few office BP have been elevated, will have CMA check in about 30 days as have asked him to record at home and monitor.  Could consider increase to amlodipine  if unable to control BP.  Hyperlipidemia: (LDL goal < 100) -Controlled -Current treatment: None noted -Medications previously tried: none  -Educated on Cholesterol goals;  Importance of limiting foods high in cholesterol; -Recommended continue current management, routine screenings  Rectal cancer/Hx of Prostate cancer (Goal: Reduce symptoms, remission) -Controlled -Current treatment  Xtandi  two tablets daily Appropriate, Effective, Safe, Accessible Xeloda  2 tablets twice daily Appropriate, Effective, Safe, Accessible Compazine  q6h prn Appropriate, Effective, Safe, Accessible -Medications previously tried: none noted -Patient is experiencing some shaking in legs when he stands up from sitting for long periods of time.  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  Nausea is occasional and is relieved by compazine.   -Will continue to monitor legs,  walks with a cane for stability.  -Recommended to continue current medication No changes at this time, all are affordable.       Willa Frater, PharmD Clinical Pharmacist  [facility] [phone]

## 2023-01-14 ENCOUNTER — Inpatient Hospital Stay: Payer: Medicare Other

## 2023-01-14 ENCOUNTER — Other Ambulatory Visit: Payer: Self-pay

## 2023-01-14 ENCOUNTER — Other Ambulatory Visit: Payer: Self-pay | Admitting: Oncology

## 2023-01-14 VITALS — BP 141/78 | HR 68 | Temp 98.2°F | Resp 18 | Ht 69.0 in | Wt 143.4 lb

## 2023-01-14 DIAGNOSIS — Z5111 Encounter for antineoplastic chemotherapy: Secondary | ICD-10-CM | POA: Diagnosis not present

## 2023-01-14 DIAGNOSIS — C20 Malignant neoplasm of rectum: Secondary | ICD-10-CM

## 2023-01-14 LAB — CBC WITH DIFFERENTIAL (CANCER CENTER ONLY)
Abs Immature Granulocytes: 0.02 10*3/uL (ref 0.00–0.07)
Basophils Absolute: 0 10*3/uL (ref 0.0–0.1)
Basophils Relative: 0 %
Eosinophils Absolute: 0.1 10*3/uL (ref 0.0–0.5)
Eosinophils Relative: 2 %
HCT: 42.8 % (ref 39.0–52.0)
Hemoglobin: 14.3 g/dL (ref 13.0–17.0)
Immature Granulocytes: 0 %
Lymphocytes Relative: 10 %
Lymphs Abs: 0.6 10*3/uL — ABNORMAL LOW (ref 0.7–4.0)
MCH: 28.4 pg (ref 26.0–34.0)
MCHC: 33.4 g/dL (ref 30.0–36.0)
MCV: 84.9 fL (ref 80.0–100.0)
Monocytes Absolute: 0.5 10*3/uL (ref 0.1–1.0)
Monocytes Relative: 9 %
Neutro Abs: 4.7 10*3/uL (ref 1.7–7.7)
Neutrophils Relative %: 79 %
Platelet Count: 278 10*3/uL (ref 150–400)
RBC: 5.04 MIL/uL (ref 4.22–5.81)
RDW: 16.1 % — ABNORMAL HIGH (ref 11.5–15.5)
WBC Count: 6 10*3/uL (ref 4.0–10.5)
nRBC: 0 % (ref 0.0–0.2)

## 2023-01-14 LAB — TOTAL PROTEIN, URINE DIPSTICK: Protein, ur: 30 mg/dL — AB

## 2023-01-14 MED ORDER — SODIUM CHLORIDE 0.9 % IV SOLN: Freq: Once | INTRAVENOUS | Status: AC

## 2023-01-14 MED ORDER — SODIUM CHLORIDE 0.9% FLUSH
10.0000 mL | INTRAVENOUS | Status: DC | PRN
  Administered 2023-01-14: 10 mL

## 2023-01-14 MED ORDER — HEPARIN SOD (PORK) LOCK FLUSH 100 UNIT/ML IV SOLN
500.0000 [IU] | Freq: Once | INTRAVENOUS | Status: AC | PRN
  Administered 2023-01-14: 500 [IU]

## 2023-01-14 MED ORDER — SODIUM CHLORIDE 0.9 % IV SOLN
5.0000 mg/kg | Freq: Once | INTRAVENOUS | Status: AC
  Administered 2023-01-14: 300 mg via INTRAVENOUS
  Filled 2023-01-14: qty 12

## 2023-01-14 NOTE — Patient Instructions (Signed)
[facility]   Discharge Instructions: Thank you for choosing [facility] to provide your oncology and hematology care.   If you have a lab appointment with the Cancer Center, please go directly to the Cancer Center and check in at the registration area.   Wear comfortable clothing and clothing appropriate for easy access to any Portacath or PICC line.   We strive to give you quality time with your provider. You may need to reschedule your appointment if you arrive late (15 or more minutes).  Arriving late affects you and other patients whose appointments are after yours.  Also, if you miss three or more appointments without notifying the office, you may be dismissed from the clinic at the provider's discretion.      For prescription refill requests, have your pharmacy contact our office and allow 72 hours for refills to be completed.    Today you received the following chemotherapy and/or immunotherapy agents Avastin.      To help prevent nausea and vomiting after your treatment, we encourage you to take your nausea medication as directed.  BELOW ARE SYMPTOMS THAT SHOULD BE REPORTED IMMEDIATELY: *FEVER GREATER THAN 100.4 F (38 C) OR HIGHER *CHILLS OR SWEATING *NAUSEA AND VOMITING THAT IS NOT CONTROLLED WITH YOUR NAUSEA MEDICATION *UNUSUAL SHORTNESS OF BREATH *UNUSUAL BRUISING OR BLEEDING *URINARY PROBLEMS (pain or burning when urinating, or frequent urination) *BOWEL PROBLEMS (unusual diarrhea, constipation, pain near the anus) TENDERNESS IN MOUTH AND THROAT WITH OR WITHOUT PRESENCE OF ULCERS (sore throat, sores in mouth, or a toothache) UNUSUAL RASH, SWELLING OR PAIN  UNUSUAL VAGINAL DISCHARGE OR ITCHING   Items with * indicate a potential emergency and should be followed up as soon as possible or go to the Emergency Department if any problems should occur.  Please show the CHEMOTHERAPY ALERT CARD or IMMUNOTHERAPY ALERT CARD at  check-in to the Emergency Department and triage nurse.  Should you have questions after your visit or need to cancel or reschedule your appointment, please contact [facility]  Dept: [phone]  and follow the prompts.  Office hours are 8:00 a.m. to 4:30 p.m. Monday - Friday. Please note that voicemails left after 4:00 p.m. may not be returned until the following business day.  We are closed weekends and major holidays. You have access to a nurse at all times for urgent questions. Please call the main number to the clinic Dept: [phone] and follow the prompts.   For any non-urgent questions, you may also contact your provider using MyChart. We now offer e-Visits for anyone 18 and older to request care online for non-urgent symptoms. For details visit [email or web address].   Also download the MyChart app! Go to the app store, search "MyChart", open the app, select [place], and log in with your MyChart username and password.  Bevacizumab Injection What is this medication? BEVACIZUMAB (be va SIZ yoo mab) treats some types of cancer. It works by blocking a protein that causes cancer cells to grow and multiply. This helps to slow or stop the spread of cancer cells. It is a monoclonal antibody. This medicine may be used for other purposes; ask your health care provider or pharmacist if you have questions. COMMON BRAND NAME(S): Alymsys, Avastin, MVASI, Zirabev What should I tell my care team before I take this medication? They need to know if you have any of these conditions: Blood clots Coughing up blood Having or recent surgery Heart failure   High blood pressure History of a connection between 2 or more body parts that do not usually connect (fistula) History of a tear in your stomach or intestines Protein in your urine An unusual or allergic reaction to bevacizumab, other medications, foods, dyes, or preservatives Pregnant or trying to get  pregnant Breast-feeding How should I use this medication? This medication is injected into a vein. It is given by your care team in a hospital or clinic setting. Talk to your care team the use of this medication in children. Special care may be needed. Overdosage: If you think you have taken too much of this medicine contact a poison control center or emergency room at once. NOTE: This medicine is only for you. Do not share this medicine with others. What if I miss a dose? Keep appointments for follow-up doses. It is important not to miss your dose. Call your care team if you are unable to keep an appointment. What may interact with this medication? Interactions are not expected. This list may not describe all possible interactions. Give your health care provider a list of all the medicines, herbs, non-prescription drugs, or dietary supplements you use. Also tell them if you smoke, drink alcohol, or use illegal drugs. Some items may interact with your medicine. What should I watch for while using this medication? Your condition will be monitored carefully while you are receiving this medication. You may need blood work while taking this medication. This medication may make you feel generally unwell. This is not uncommon as chemotherapy can affect healthy cells as well as cancer cells. Report any side effects. Continue your course of treatment even though you feel ill unless your care team tells you to stop. This medication may increase your risk to bruise or bleed. Call your care team if you notice any unusual bleeding. Before having surgery, talk to your care team to make sure it is ok. This medication can increase the risk of poor healing of your surgical site or wound. You will need to stop this medication for 28 days before surgery. After surgery, wait at least 28 days before restarting this medication. Make sure the surgical site or wound is healed enough before restarting this medication. Talk  to your care team if questions. Talk to your care team if you may be pregnant. Serious birth defects can occur if you take this medication during pregnancy and for 6 months after the last dose. Contraception is recommended while taking this medication and for 6 months after the last dose. Your care team can help you find the option that works for you. Do not breastfeed while taking this medication and for 6 months after the last dose. This medication can cause infertility. Talk to your care team if you are concerned about your fertility. What side effects may I notice from receiving this medication? Side effects that you should report to your care team as soon as possible: Allergic reactions--skin rash, itching, hives, swelling of the face, lips, tongue, or throat Bleeding--bloody or black, tar-like stools, vomiting blood or brown material that looks like coffee grounds, red or dark brown urine, small red or purple spots on skin, unusual bruising or bleeding Blood clot--pain, swelling, or warmth in the leg, shortness of breath, chest pain Heart attack--pain or tightness in the chest, shoulders, arms, or jaw, nausea, shortness of breath, cold or clammy skin, feeling faint or lightheaded Heart failure--shortness of breath, swelling of the ankles, feet, or hands, sudden weight gain, unusual weakness or fatigue   Increase in blood pressure Infection--fever, chills, cough, sore throat, wounds that don't heal, pain or trouble when passing urine, general feeling of discomfort or being unwell Infusion reactions--chest pain, shortness of breath or trouble breathing, feeling faint or lightheaded Kidney injury--decrease in the amount of urine, swelling of the ankles, hands, or feet Stomach pain that is severe, does not go away, or gets worse Stroke--sudden numbness or weakness of the face, arm, or leg, trouble speaking, confusion, trouble walking, loss of balance or coordination, dizziness, severe headache,  change in vision Sudden and severe headache, confusion, change in vision, seizures, which may be signs of posterior reversible encephalopathy syndrome (PRES) Side effects that usually do not require medical attention (report to your care team if they continue or are bothersome): Back pain Change in taste Diarrhea Dry skin Increased tears Nosebleed This list may not describe all possible side effects. Call your doctor for medical advice about side effects. You may report side effects to FDA at 1-800-FDA-1088. Where should I keep my medication? This medication is given in a hospital or clinic. It will not be stored at home. NOTE: This sheet is a summary. It may not cover all possible information. If you have questions about this medicine, talk to your doctor, pharmacist, or health care provider.  2023 Elsevier/Gold Standard (2022-01-11 00:00:00)   

## 2023-01-15 ENCOUNTER — Telehealth: Payer: Self-pay

## 2023-01-15 ENCOUNTER — Other Ambulatory Visit (HOSPITAL_COMMUNITY): Payer: Self-pay

## 2023-01-15 NOTE — Telephone Encounter (Signed)
Oral Oncology Patient Advocate Encounter  Reached out and spoke with patient regarding PAP paperwork, explained that I would send it to their preferred email via DocuSign.   Confirmed email address: [email or web address].    Patient expressed understanding and consent.  Will follow up once paperwork has been signed and returned.   Ardeen Fillers, CPhT Oncology Pharmacy Patient Advocate  [facility]  [phone] (phone) [phone] (fax) 01/15/2023 11:59 AM

## 2023-01-15 NOTE — Telephone Encounter (Signed)
Received back patient signatures. I will submit once MD signatures are in hand.    Ardeen Fillers, CPhT Oncology Pharmacy Patient Advocate  [facility]  [phone] (phone) [phone] (fax) 01/15/2023 2:47 PM

## 2023-01-15 NOTE — Telephone Encounter (Addendum)
Oral Oncology Patient Advocate Encounter   Received phone call from patient stating that [facility] was unable to procure a co-pay card. I called [facility] and was informed that patient's insurance plan was a [employer] and was therefore ineligible for co-pay card. Starting [employer].  Began application for assistance for [place] through [facility] Patient Support Patient Assistance Program.   Application will be submitted upon completion of necessary supporting documentation.   [employer] phone number [phone].   I will continue to check the status until final determination.    Ardeen Fillers, CPhT Oncology Pharmacy Patient Advocate  [facility]  [phone] (phone) [phone] (fax) 01/15/2023 11:58 AM

## 2023-01-16 NOTE — Telephone Encounter (Signed)
Oral Oncology Patient Advocate Encounter   Submitted application for assistance for [place] to [facility] Patient Support Patient Assistance Program.   Application submitted via e-fax to [phone]   [facility]'s phone number [phone].   I will continue to check the status until final determination.    Ardeen Fillers, CPhT Oncology Pharmacy Patient Advocate  [facility]  [phone] (phone) [phone] (fax) 01/16/2023 8:13 AM

## 2023-01-16 NOTE — Telephone Encounter (Signed)
Received notification from [facility] that application was received and in processing. I will continue to follow and update until final determination.    Ardeen Fillers, CPhT Oncology Pharmacy Patient Advocate  [facility]  [phone] (phone) [phone] (fax) 01/16/2023 1:22 PM

## 2023-01-17 ENCOUNTER — Telehealth: Payer: Self-pay | Admitting: Oncology

## 2023-01-20 ENCOUNTER — Telehealth: Payer: Self-pay

## 2023-01-20 NOTE — Telephone Encounter (Signed)
Neil Perry reached out to schedule an appointment with Dr. Truett Perna to discuss a potential change in his treatment plan. He expressed concerns about continuing chemotherapy due to the severe fatigue, diarrhea, and insomnia he experienced after his last treatment.

## 2023-01-21 ENCOUNTER — Other Ambulatory Visit: Payer: Self-pay | Admitting: *Deleted

## 2023-01-21 ENCOUNTER — Inpatient Hospital Stay: Payer: Medicare Other | Admitting: Oncology

## 2023-01-21 ENCOUNTER — Other Ambulatory Visit: Payer: Self-pay | Admitting: Nurse Practitioner

## 2023-01-21 ENCOUNTER — Inpatient Hospital Stay: Payer: Medicare Other

## 2023-01-21 ENCOUNTER — Inpatient Hospital Stay (HOSPITAL_BASED_OUTPATIENT_CLINIC_OR_DEPARTMENT_OTHER): Payer: Medicare Other | Admitting: Oncology

## 2023-01-21 VITALS — BP 148/89 | HR 80 | Resp 18

## 2023-01-21 VITALS — BP 170/96 | HR 92 | Temp 98.1°F | Resp 18 | Ht 69.0 in | Wt 138.5 lb

## 2023-01-21 DIAGNOSIS — Z95828 Presence of other vascular implants and grafts: Secondary | ICD-10-CM

## 2023-01-21 DIAGNOSIS — C20 Malignant neoplasm of rectum: Secondary | ICD-10-CM

## 2023-01-21 DIAGNOSIS — Z5111 Encounter for antineoplastic chemotherapy: Secondary | ICD-10-CM | POA: Diagnosis not present

## 2023-01-21 LAB — CMP (CANCER CENTER ONLY)
ALT: 14 U/L (ref 0–44)
AST: 12 U/L — ABNORMAL LOW (ref 15–41)
Albumin: 3.9 g/dL (ref 3.5–5.0)
Alkaline Phosphatase: 74 U/L (ref 38–126)
Anion gap: 8 (ref 5–15)
BUN: 17 mg/dL (ref 8–23)
CO2: 26 mmol/L (ref 22–32)
Calcium: 9.3 mg/dL (ref 8.9–10.3)
Chloride: 107 mmol/L (ref 98–111)
Creatinine: 0.88 mg/dL (ref 0.61–1.24)
GFR, Estimated: >60 mL/min (ref >60)
Glucose, Bld: 114 mg/dL — ABNORMAL HIGH (ref 70–99)
Potassium: 3.9 mmol/L (ref 3.5–5.1)
Sodium: 141 mmol/L (ref 135–145)
Total Bilirubin: 0.8 mg/dL (ref 0.3–1.2)
Total Protein: 6.3 g/dL — ABNORMAL LOW (ref 6.5–8.1)

## 2023-01-21 LAB — MAGNESIUM: Magnesium: 2 mg/dL (ref 1.7–2.4)

## 2023-01-21 MED ORDER — SODIUM CHLORIDE 0.9% FLUSH
10.0000 mL | INTRAVENOUS | Status: AC | PRN
  Administered 2023-01-21: 10 mL

## 2023-01-21 MED ORDER — HEPARIN SOD (PORK) LOCK FLUSH 100 UNIT/ML IV SOLN
500.0000 [IU] | INTRAVENOUS | Status: AC | PRN
  Administered 2023-01-21: 500 [IU]

## 2023-01-21 MED ORDER — SODIUM CHLORIDE 0.9 % IV SOLN: INTRAVENOUS | Status: AC

## 2023-01-21 NOTE — Patient Instructions (Signed)

## 2023-01-21 NOTE — Progress Notes (Signed)
[facility] OFFICE PROGRESS NOTE   Diagnosis: Rectal cancer  INTERVAL HISTORY:   Mr. Mariani returns prior to scheduled visit.  He was treated with bevacizumab on 01/14/2023.  He has not started Lonsurf.  He reports persistent diarrhea, up to 8 times per day.  The stool is sometimes formed.  No bleeding or symptom of thrombosis.  He continues to have pain at the "hemorrhoid ".  He has cramping discomfort in the abdomen. Mr. Surgeon feels his quality of life is poor.  He would like to discontinue chemotherapy. Objective:  Vital signs in last 24 hours:  Blood pressure (!) 170/96, pulse 92, temperature 98.1 F (36.7 C), temperature source Oral, resp. rate 18, height 5\' 9"  (1.753 m), weight 138 lb 8 oz (62.8 kg), SpO2 99 %.    HEENT: No thrush or ulcers Resp: Lungs clear bilaterally Cardio: Regular rate and rhythm GI: Soft, no hepatosplenomegaly, slight fullness in the right lower abdomen, tender at the right and central pelvic brim Vascular: No leg edema Rectal: Soft external hemorrhoids without bleeding  Portacath/PICC-without erythema  Lab Results:  Lab Results  Component Value Date   WBC 6.0 01/14/2023   HGB 14.3 01/14/2023   HCT 42.8 01/14/2023   MCV 84.9 01/14/2023   PLT 278 01/14/2023   NEUTROABS 4.7 01/14/2023    CMP  Lab Results  Component Value Date   NA 141 01/07/2023   K 3.9 01/07/2023   CL 107 01/07/2023   CO2 25 01/07/2023   GLUCOSE 124 (H) 01/07/2023   BUN 14 01/07/2023   CREATININE 0.95 01/07/2023   CALCIUM 9.5 01/07/2023   PROT 5.6 (L) 01/07/2023   ALBUMIN 4.0 01/07/2023   AST 13 (L) 01/07/2023   ALT 13 01/07/2023   ALKPHOS 61 01/07/2023   BILITOT 0.6 01/07/2023   GFRNONAA >60 01/07/2023    Lab Results  Component Value Date   CEA 128.29 (H) 01/07/2023     Medications: I have reviewed the patient's current medications.   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 2-3 new hypervascular masses in the liver dome,  no abdominal lymphadenopathy, stable benign left adrenal adenoma, stable right lower pole kidney mass Ultrasound-guided biopsy of a liver lesion 05/07/2021-metastatic adenocarcinoma with extensive necrosis, immunohistochemical profile consistent with a colorectal primary; foundation 1-microsatellite stable, tumor mutation burden 5, K-rasG 12V, NRAS wildtype Colonoscopy 05/22/2021-ulcerated partially obstructing mass at 15 cm from anal verge CTs 05/30/2021-numerous small pulmonary nodules concerning for metastases, thickening of the rectum, multiple liver metastases Cycle 1 FOLFOX 06/12/2021 Cycle 2 FOLFOX 06/26/2021 Cycle 3 FOLFOX 07/10/2021, oxaliplatin dose reduced due to progressive decline in the ANC and platelet count Cycle 4 FOLFOX 07/24/2021 Cycle 5 FOLFOX 08/07/2021, 5-FU bolus eliminated and oxaliplatin dose reduced, insurance would not approve Udenyca CTs 08/15/2021-decrease size of lung nodules, decreased hepatic metastases, persistent anorectal wall thickening, stable right renal mass Cycle 6 FOLFOX 08/21/2021, 5-FU bolus and 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 Cycle 7 FOLFOX 09/04/2021, 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 Cycle 8 FOLFOX 09/18/2021, 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 Cycle 9 FOLFOX 10/08/2021, 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 Cycle 10 FOLFOX 10/23/2021, 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 CTs 11/01/2021-decrease in size of occasional small pulmonary nodules, continued decrease in size of multiple hypoenhancing hepatic metastases, unchanged posttreatment appearance of the low rectum, unchanged exophytic mass of the inferior pole of the right kidney measuring 1.5 x 1.3 cm Cycle 11 5-fluorouracil 11/06/2021 Cycle 12 5-fluorouracil 11/20/2021 Cycle 13 5-fluorouracil 12/04/2021 Maintenance Xeloda beginning  12/19/2021 Xeloda dose reduced to 1000 mg twice daily beginning 12/27/2021 Xeloda placed on hold  01/16/2022 CTs 01/22/2022-segment of distal ileum with circumferential wall thickening and perienteric inflammation, decrease in size of liver metastases, stable tiny bilateral pulmonary nodules, circumferential wall thickening in the rectum, stable lower pole right kidney lesion suspicious for renal cell carcinoma Xeloda discontinued due to diarrhea 5-fluorouracil pump 02/05/2022 5-fluorouracil pump 02/26/2022 Treatment held 03/18/2022 due to diarrhea 5-fluorouracil pump 03/25/2022, dose reduced due to diarrhea 5-fluorouracil pump 04/16/2022 CTs 05/03/2022-unchanged tiny pulmonary nodules, unchanged liver metastases, stable appearance of the low rectal wall thickening, unchanged posterior right renal mass 5-fluorouracil pump 05/07/2022 5-fluorouracil pump 05/28/2022 5-fluorouracil pump 06/18/2022 CTs 07/04/2022-increased size of liver metastases, no new lesions, new 3 mm right lower lobe nodule-nonspecific Cycle 1 FOLFIRI/bevacizumab 07/16/2022 Cycle 2 FOLFIRI/bevacizumab 08/05/2022; 5-FU bolus eliminated, irinotecan dose reduced due to diarrhea and weight loss Cycle 3 FOLFIRI/bevacizumab 08/21/2022 Cycle 4 FOLFIRI/bevacizumab 09/03/2022 Cycle 5 FOLFIRI/bevacizumab 09/17/2022 CTs 09/26/2022-stable rectal thickening, stable hepatic metastases (dominant lesion appears smaller by my review), small indeterminate pulmonary nodules, unchanged exophytic enhancing lesion at the lower pole of the right kidney Cycle 6 FOLFIRI/bevacizumab 10/01/2022 Cycle 7 FOLFIRI/bevacizumab 10/15/2022, irinotecan dose reduced due to diarrhea Cycle 8 FOLFIRI/bevacizumab 11/05/2022 Cycle 9 FOLFIRI/bevacizumab 11/26/2022 Cycle 10 FOLFIRI/bevacizumab 12/17/2022 CTs 01/02/2023 increased size and several liver lesion stable rectal wall, stable tiny lung nodules 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  Mitral valve prolapse 5.  Family history of prostate cancer 6.  08/15/2021 with leg weakness and an episode of slurred speech-TIA?,  Brain imaging negative 7.  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  He was scheduled to begin salvage treatment with Lonsurf/Avastin last week.  He did not begin Lonsurf.  He has persistent diarrhea.  I doubt the diarrhea is related to the 5-FU and irinotecan he received last month.  We will check a stool sample for C. difficile.  He will receive intravenous fluids and we will check electrolytes today.  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  The chance of improvement with further systemic therapy is small.  His performance status has declined over the past few weeks.  He would like to enroll in hospice care.  We made a referral to [facility] for home hospice.  We discussed CPR and ACLS.  He will be placed on a no CODE BLUE status.  He will return for an office visit as scheduled on 01/28/2023.  Lonsurf and Avastin will be discontinued.  Thornton Papas, MD  01/21/2023  12:06 PM

## 2023-01-21 NOTE — Progress Notes (Signed)
Patient was unable to provide stool specimen during this visit. He was discharged home with the supplies. He knows to bring same back when collected.

## 2023-01-21 NOTE — Telephone Encounter (Signed)
Called to check status of application. Informed by representative that patient had commercial insurance. I informed representative that plan was not commercial, it was a Contractor. Representative stated that they notated that would be reaching out to patient for screening questionnaire and that we would receive a fax notifying us of decision. I will continue to follow and update until final determination.    Ardeen Fillers, CPhT Oncology Pharmacy Patient Advocate  [facility]  [phone] (phone) [phone] (fax) 01/21/2023 9:15 AM

## 2023-01-22 NOTE — Telephone Encounter (Signed)
Per MD note, patient has decided to not pursue therapy. I have called and cancelled Patient Assistance enrollment for Lonsurf from [facility] Patient Support.    Ardeen Fillers, CPhT Oncology Pharmacy Patient Advocate  [facility]  [phone] (phone) [phone] (fax) 01/22/2023 8:51 AM

## 2023-01-23 ENCOUNTER — Telehealth: Payer: Self-pay | Admitting: *Deleted

## 2023-01-23 NOTE — Telephone Encounter (Addendum)
Hospice RN left VM requesting return call to discuss patient symptoms and questions he has. Called back on 5/2 @ 1300: Left VM Left VM again on 5/3 at 0844.

## 2023-01-24 ENCOUNTER — Other Ambulatory Visit (HOSPITAL_COMMUNITY)
Admission: RE | Admit: 2023-01-24 | Discharge: 2023-01-24 | Disposition: A | Discharge status: Home / Self Care | Source: Ambulatory Visit | Attending: Nurse Practitioner | Admitting: Nurse Practitioner

## 2023-01-24 ENCOUNTER — Other Ambulatory Visit: Payer: Self-pay | Admitting: *Deleted

## 2023-01-24 ENCOUNTER — Other Ambulatory Visit: Payer: Self-pay | Admitting: Nurse Practitioner

## 2023-01-24 DIAGNOSIS — C20 Malignant neoplasm of rectum: Secondary | ICD-10-CM | POA: Insufficient documentation

## 2023-01-24 LAB — C DIFFICILE QUICK SCREEN W PCR REFLEX
C Diff antigen: NEGATIVE
C Diff interpretation: NOT DETECTED
C Diff toxin: NEGATIVE

## 2023-01-28 ENCOUNTER — Inpatient Hospital Stay: Payer: Medicare Other | Attending: Nurse Practitioner | Admitting: Oncology

## 2023-01-28 ENCOUNTER — Inpatient Hospital Stay: Payer: Medicare Other

## 2023-01-28 VITALS — BP 149/89 | HR 98 | Temp 97.9°F | Resp 18 | Ht 69.0 in | Wt 140.3 lb

## 2023-01-28 DIAGNOSIS — Z5111 Encounter for antineoplastic chemotherapy: Secondary | ICD-10-CM | POA: Diagnosis present

## 2023-01-28 DIAGNOSIS — C20 Malignant neoplasm of rectum: Secondary | ICD-10-CM | POA: Diagnosis not present

## 2023-01-28 DIAGNOSIS — I341 Nonrheumatic mitral (valve) prolapse: Secondary | ICD-10-CM | POA: Diagnosis not present

## 2023-01-28 DIAGNOSIS — Z8042 Family history of malignant neoplasm of prostate: Secondary | ICD-10-CM | POA: Insufficient documentation

## 2023-01-28 DIAGNOSIS — C787 Secondary malignant neoplasm of liver and intrahepatic bile duct: Secondary | ICD-10-CM | POA: Insufficient documentation

## 2023-01-28 MED ORDER — ZOLPIDEM TARTRATE 5 MG PO TABS: 5.0000 mg | ORAL_TABLET | Freq: Every evening | ORAL | 0 refills | Status: DC | PRN

## 2023-01-28 NOTE — Progress Notes (Signed)
[facility] OFFICE PROGRESS NOTE   Diagnosis: Rectal cancer  INTERVAL HISTORY:   Mr. Kise has enrolled in home hospice care.  The hospice nurse will be visiting tomorrow.  He reports improvement in his appetite and energy level over the past week.  He continues to have hemorrhoid discomfort and diarrhea.  He reports he sleeps for 1 hour after taking Ativan and then wakes up.  Objective:  Vital signs in last 24 hours:  Blood pressure (!) 149/89, pulse 98, temperature 97.9 F (36.6 C), temperature source Oral, resp. rate 18, height 5\' 9"  (1.753 m), weight 140 lb 4.8 oz (63.6 kg), SpO2 100 %.    HEENT: No thrush or ulcers Resp: Lungs clear bilaterally Cardio: Regular rate and rhythm with premature beats GI: No hepatosplenomegaly, soft and nontender Vascular: No leg edema  Portacath/PICC-without erythema  Lab Results:  Lab Results  Component Value Date   WBC 6.0 01/14/2023   HGB 14.3 01/14/2023   HCT 42.8 01/14/2023   MCV 84.9 01/14/2023   PLT 278 01/14/2023   NEUTROABS 4.7 01/14/2023    CMP  Lab Results  Component Value Date   NA 141 01/21/2023   K 3.9 01/21/2023   CL 107 01/21/2023   CO2 26 01/21/2023   GLUCOSE 114 (H) 01/21/2023   BUN 17 01/21/2023   CREATININE 0.88 01/21/2023   CALCIUM 9.3 01/21/2023   PROT 6.3 (L) 01/21/2023   ALBUMIN 3.9 01/21/2023   AST 12 (L) 01/21/2023   ALT 14 01/21/2023   ALKPHOS 74 01/21/2023   BILITOT 0.8 01/21/2023   GFRNONAA >60 01/21/2023    Lab Results  Component Value Date   CEA 128.29 (H) 01/07/2023    Medications: I have reviewed the patient's current medications.   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 2-3 new hypervascular masses in the liver dome, no abdominal lymphadenopathy, stable benign left adrenal adenoma, stable right lower pole kidney mass Ultrasound-guided biopsy of a liver lesion 05/07/2021-metastatic adenocarcinoma with extensive necrosis, immunohistochemical profile  consistent with a colorectal primary; foundation 1-microsatellite stable, tumor mutation burden 5, K-rasG 12V, NRAS wildtype Colonoscopy 05/22/2021-ulcerated partially obstructing mass at 15 cm from anal verge CTs 05/30/2021-numerous small pulmonary nodules concerning for metastases, thickening of the rectum, multiple liver metastases Cycle 1 FOLFOX 06/12/2021 Cycle 2 FOLFOX 06/26/2021 Cycle 3 FOLFOX 07/10/2021, oxaliplatin dose reduced due to progressive decline in the ANC and platelet count Cycle 4 FOLFOX 07/24/2021 Cycle 5 FOLFOX 08/07/2021, 5-FU bolus eliminated and oxaliplatin dose reduced, insurance would not approve Udenyca CTs 08/15/2021-decrease size of lung nodules, decreased hepatic metastases, persistent anorectal wall thickening, stable right renal mass Cycle 6 FOLFOX 08/21/2021, 5-FU bolus and 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 Cycle 7 FOLFOX 09/04/2021, 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 Cycle 8 FOLFOX 09/18/2021, 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 Cycle 9 FOLFOX 10/08/2021, 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 Cycle 10 FOLFOX 10/23/2021, 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 CTs 11/01/2021-decrease in size of occasional small pulmonary nodules, continued decrease in size of multiple hypoenhancing hepatic metastases, unchanged posttreatment appearance of the low rectum, unchanged exophytic mass of the inferior pole of the right kidney measuring 1.5 x 1.3 cm Cycle 11 5-fluorouracil 11/06/2021 Cycle 12 5-fluorouracil 11/20/2021 Cycle 13 5-fluorouracil 12/04/2021 Maintenance Xeloda beginning 12/19/2021 Xeloda dose reduced to 1000 mg twice daily beginning 12/27/2021 Xeloda placed on hold 01/16/2022 CTs 01/22/2022-segment of distal ileum with circumferential wall thickening and perienteric inflammation, decrease in size of liver metastases, stable tiny bilateral pulmonary nodules, circumferential wall  thickening in the  rectum, stable lower pole right kidney lesion suspicious for renal cell carcinoma Xeloda discontinued due to diarrhea 5-fluorouracil pump 02/05/2022 5-fluorouracil pump 02/26/2022 Treatment held 03/18/2022 due to diarrhea 5-fluorouracil pump 03/25/2022, dose reduced due to diarrhea 5-fluorouracil pump 04/16/2022 CTs 05/03/2022-unchanged tiny pulmonary nodules, unchanged liver metastases, stable appearance of the low rectal wall thickening, unchanged posterior right renal mass 5-fluorouracil pump 05/07/2022 5-fluorouracil pump 05/28/2022 5-fluorouracil pump 06/18/2022 CTs 07/04/2022-increased size of liver metastases, no new lesions, new 3 mm right lower lobe nodule-nonspecific Cycle 1 FOLFIRI/bevacizumab 07/16/2022 Cycle 2 FOLFIRI/bevacizumab 08/05/2022; 5-FU bolus eliminated, irinotecan dose reduced due to diarrhea and weight loss Cycle 3 FOLFIRI/bevacizumab 08/21/2022 Cycle 4 FOLFIRI/bevacizumab 09/03/2022 Cycle 5 FOLFIRI/bevacizumab 09/17/2022 CTs 09/26/2022-stable rectal thickening, stable hepatic metastases (dominant lesion appears smaller by my review), small indeterminate pulmonary nodules, unchanged exophytic enhancing lesion at the lower pole of the right kidney Cycle 6 FOLFIRI/bevacizumab 10/01/2022 Cycle 7 FOLFIRI/bevacizumab 10/15/2022, irinotecan dose reduced due to diarrhea Cycle 8 FOLFIRI/bevacizumab 11/05/2022 Cycle 9 FOLFIRI/bevacizumab 11/26/2022 Cycle 10 FOLFIRI/bevacizumab 12/17/2022 CTs 01/02/2023 increased size and several liver lesion stable rectal wall, stable tiny lung nodules 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  Mitral valve prolapse 5.  Family history of prostate cancer 6.  08/15/2021 with leg weakness and an episode of slurred speech-TIA?,  Brain imaging negative 7.  Diarrhea and fatigue while on capecitabine April 2023-capecitabine discontinued 01/18/2022       Disposition: Mr Delashmit has metastatic rectal cancer.  Restaging CTs last month revealed disease progression.  He has decided to discontinue systemic therapy.  He enrolled in home hospice care.  He will continue follow-up with the home hospice team.  He will return for an office visit and Port-A-Cath flush in 4 weeks.  He will try Ambien for insomnia.  I recommended he not take lorazepam and Ambien together.  Thornton Papas, MD  01/28/2023  11:34 AM

## 2023-01-29 ENCOUNTER — Inpatient Hospital Stay: Payer: Medicare Other | Admitting: Nutrition

## 2023-01-30 ENCOUNTER — Inpatient Hospital Stay: Payer: Medicare Other

## 2023-02-11 ENCOUNTER — Ambulatory Visit: Payer: Medicare Other

## 2023-02-11 ENCOUNTER — Ambulatory Visit: Payer: Medicare Other | Admitting: Oncology

## 2023-02-11 ENCOUNTER — Other Ambulatory Visit: Payer: Medicare Other

## 2023-02-27 ENCOUNTER — Telehealth: Payer: Self-pay

## 2023-02-27 NOTE — Telephone Encounter (Signed)
[facility] contacted our office to report experiencing abdominal cramping and diarrhea since yesterday. The patient mentioned having approximately 20 episodes yesterday, but only 4 today. He has stated that he is feeling better now and wanted to inform the provider about his symptoms.

## 2023-03-04 ENCOUNTER — Inpatient Hospital Stay (HOSPITAL_BASED_OUTPATIENT_CLINIC_OR_DEPARTMENT_OTHER): Admitting: Oncology

## 2023-03-04 ENCOUNTER — Inpatient Hospital Stay: Attending: Nurse Practitioner

## 2023-03-04 ENCOUNTER — Encounter: Payer: Self-pay | Admitting: Oncology

## 2023-03-04 ENCOUNTER — Other Ambulatory Visit: Payer: Self-pay | Admitting: *Deleted

## 2023-03-04 VITALS — BP 144/75 | HR 71 | Temp 98.1°F | Resp 18 | Ht 69.0 in | Wt 142.6 lb

## 2023-03-04 DIAGNOSIS — C20 Malignant neoplasm of rectum: Secondary | ICD-10-CM | POA: Diagnosis present

## 2023-03-04 DIAGNOSIS — Z8546 Personal history of malignant neoplasm of prostate: Secondary | ICD-10-CM | POA: Insufficient documentation

## 2023-03-04 DIAGNOSIS — C787 Secondary malignant neoplasm of liver and intrahepatic bile duct: Secondary | ICD-10-CM | POA: Insufficient documentation

## 2023-03-04 DIAGNOSIS — Z8042 Family history of malignant neoplasm of prostate: Secondary | ICD-10-CM | POA: Diagnosis not present

## 2023-03-04 DIAGNOSIS — N2889 Other specified disorders of kidney and ureter: Secondary | ICD-10-CM | POA: Diagnosis not present

## 2023-03-04 DIAGNOSIS — I341 Nonrheumatic mitral (valve) prolapse: Secondary | ICD-10-CM | POA: Insufficient documentation

## 2023-03-04 MED ORDER — LORAZEPAM 0.5 MG PO TABS: ORAL_TABLET | ORAL | 0 refills | Status: AC

## 2023-03-04 MED ORDER — LORAZEPAM 0.5 MG PO TABS: ORAL_TABLET | ORAL | 0 refills | Status: DC

## 2023-03-04 NOTE — Progress Notes (Signed)
[facility] OFFICE PROGRESS NOTE   Diagnosis: Rectal cancer  INTERVAL HISTORY:   [facility] returns as scheduled.  He is enrolled in home hospice care.  The hospice nurse is visiting weekly.  He reports malaise and anorexia.  He stays in bed most of the time.  Hemorrhoid pain has improved with ibuprofen, a topical oil, and Preparation H with lidocaine.  He had 1 day of diarrhea last week.  Lorazepam helps insomnia.  Objective:  Vital signs in last 24 hours:  Blood pressure (!) 144/75, pulse 71, temperature 98.1 F (36.7 C), temperature source Oral, resp. rate 18, height 5\' 9"  (1.753 m), weight 142 lb 9.6 oz (64.7 kg), SpO2 100 %.    HEENT: No thrush Resp: Lungs clear bilaterally Cardio: Regular rate and rhythm GI: No hepatosplenomegaly, nontender Vascular: No leg edema  Portacath/PICC-without erythema  Lab Results:  Lab Results  Component Value Date   WBC 6.0 01/14/2023   HGB 14.3 01/14/2023   HCT 42.8 01/14/2023   MCV 84.9 01/14/2023   PLT 278 01/14/2023   NEUTROABS 4.7 01/14/2023    CMP  Lab Results  Component Value Date   NA 141 01/21/2023   K 3.9 01/21/2023   CL 107 01/21/2023   CO2 26 01/21/2023   GLUCOSE 114 (H) 01/21/2023   BUN 17 01/21/2023   CREATININE 0.88 01/21/2023   CALCIUM 9.3 01/21/2023   PROT 6.3 (L) 01/21/2023   ALBUMIN 3.9 01/21/2023   AST 12 (L) 01/21/2023   ALT 14 01/21/2023   ALKPHOS 74 01/21/2023   BILITOT 0.8 01/21/2023   GFRNONAA >60 01/21/2023    Lab Results  Component Value Date   CEA 128.29 (H) 01/07/2023     Medications: I have reviewed the patient's current medications.   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 2-3 new hypervascular masses in the liver dome, no abdominal lymphadenopathy, stable benign left adrenal adenoma, stable right lower pole kidney mass Ultrasound-guided biopsy of a liver lesion 05/07/2021-metastatic adenocarcinoma with extensive necrosis, immunohistochemical profile  consistent with a colorectal primary; foundation 1-microsatellite stable, tumor mutation burden 5, K-rasG 12V, NRAS wildtype Colonoscopy 05/22/2021-ulcerated partially obstructing mass at 15 cm from anal verge CTs 05/30/2021-numerous small pulmonary nodules concerning for metastases, thickening of the rectum, multiple liver metastases Cycle 1 FOLFOX 06/12/2021 Cycle 2 FOLFOX 06/26/2021 Cycle 3 FOLFOX 07/10/2021, oxaliplatin dose reduced due to progressive decline in the ANC and platelet count Cycle 4 FOLFOX 07/24/2021 Cycle 5 FOLFOX 08/07/2021, 5-FU bolus eliminated and oxaliplatin dose reduced, insurance would not approve Udenyca CTs 08/15/2021-decrease size of lung nodules, decreased hepatic metastases, persistent anorectal wall thickening, stable right renal mass Cycle 6 FOLFOX 08/21/2021, 5-FU bolus and 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 Cycle 7 FOLFOX 09/04/2021, 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 Cycle 8 FOLFOX 09/18/2021, 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 Cycle 9 FOLFOX 10/08/2021, 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 Cycle 10 FOLFOX 10/23/2021, 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 CTs 11/01/2021-decrease in size of occasional small pulmonary nodules, continued decrease in size of multiple hypoenhancing hepatic metastases, unchanged posttreatment appearance of the low rectum, unchanged exophytic mass of the inferior pole of the right kidney measuring 1.5 x 1.3 cm Cycle 11 5-fluorouracil 11/06/2021 Cycle 12 5-fluorouracil 11/20/2021 Cycle 13 5-fluorouracil 12/04/2021 Maintenance Xeloda beginning 12/19/2021 Xeloda dose reduced to 1000 mg twice daily beginning 12/27/2021 Xeloda placed on hold 01/16/2022 CTs 01/22/2022-segment of distal ileum with circumferential wall thickening and perienteric inflammation, decrease in size of liver metastases, stable tiny bilateral pulmonary  nodules, circumferential wall thickening in the  rectum, stable lower pole right kidney lesion suspicious for renal cell carcinoma Xeloda discontinued due to diarrhea 5-fluorouracil pump 02/05/2022 5-fluorouracil pump 02/26/2022 Treatment held 03/18/2022 due to diarrhea 5-fluorouracil pump 03/25/2022, dose reduced due to diarrhea 5-fluorouracil pump 04/16/2022 CTs 05/03/2022-unchanged tiny pulmonary nodules, unchanged liver metastases, stable appearance of the low rectal wall thickening, unchanged posterior right renal mass 5-fluorouracil pump 05/07/2022 5-fluorouracil pump 05/28/2022 5-fluorouracil pump 06/18/2022 CTs 07/04/2022-increased size of liver metastases, no new lesions, new 3 mm right lower lobe nodule-nonspecific Cycle 1 FOLFIRI/bevacizumab 07/16/2022 Cycle 2 FOLFIRI/bevacizumab 08/05/2022; 5-FU bolus eliminated, irinotecan dose reduced due to diarrhea and weight loss Cycle 3 FOLFIRI/bevacizumab 08/21/2022 Cycle 4 FOLFIRI/bevacizumab 09/03/2022 Cycle 5 FOLFIRI/bevacizumab 09/17/2022 CTs 09/26/2022-stable rectal thickening, stable hepatic metastases (dominant lesion appears smaller by my review), small indeterminate pulmonary nodules, unchanged exophytic enhancing lesion at the lower pole of the right kidney Cycle 6 FOLFIRI/bevacizumab 10/01/2022 Cycle 7 FOLFIRI/bevacizumab 10/15/2022, irinotecan dose reduced due to diarrhea Cycle 8 FOLFIRI/bevacizumab 11/05/2022 Cycle 9 FOLFIRI/bevacizumab 11/26/2022 Cycle 10 FOLFIRI/bevacizumab 12/17/2022 CTs 01/02/2023 increased size and several liver lesion stable rectal wall, stable tiny lung nodules 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  Mitral valve prolapse 5.  Family history of prostate cancer 6.  08/15/2021 with leg weakness and an episode of slurred speech-TIA?,  Brain imaging negative 7.  Diarrhea and fatigue while on capecitabine April 2023-capecitabine discontinued 01/18/2022    Disposition: Neil Perry has metastatic rectal cancer.  He is enrolled in home hospice care.  He is experiencing a slow decline in his performance status.  He will continue the current medical regimen.  He will follow-up with the home hospice RN.  [facility]  [facility] will return for an office visit in 6 weeks.  Refilled his prescription for lorazepam.  Thornton Papas, MD  03/04/2023  10:46 AM

## 2023-04-15 ENCOUNTER — Other Ambulatory Visit: Payer: Medicare Other

## 2023-04-15 ENCOUNTER — Inpatient Hospital Stay (HOSPITAL_BASED_OUTPATIENT_CLINIC_OR_DEPARTMENT_OTHER): Admitting: Nurse Practitioner

## 2023-04-15 ENCOUNTER — Telehealth: Payer: Self-pay

## 2023-04-15 ENCOUNTER — Encounter: Payer: Self-pay | Admitting: Nurse Practitioner

## 2023-04-15 ENCOUNTER — Inpatient Hospital Stay: Attending: Nurse Practitioner

## 2023-04-15 VITALS — BP 158/86 | HR 82 | Temp 98.1°F | Resp 18 | Ht 69.0 in | Wt 139.0 lb

## 2023-04-15 DIAGNOSIS — R918 Other nonspecific abnormal finding of lung field: Secondary | ICD-10-CM | POA: Insufficient documentation

## 2023-04-15 DIAGNOSIS — R197 Diarrhea, unspecified: Secondary | ICD-10-CM | POA: Insufficient documentation

## 2023-04-15 DIAGNOSIS — I341 Nonrheumatic mitral (valve) prolapse: Secondary | ICD-10-CM | POA: Diagnosis not present

## 2023-04-15 DIAGNOSIS — C20 Malignant neoplasm of rectum: Secondary | ICD-10-CM | POA: Insufficient documentation

## 2023-04-15 DIAGNOSIS — Z8042 Family history of malignant neoplasm of prostate: Secondary | ICD-10-CM | POA: Insufficient documentation

## 2023-04-15 DIAGNOSIS — K5900 Constipation, unspecified: Secondary | ICD-10-CM | POA: Insufficient documentation

## 2023-04-15 DIAGNOSIS — N2889 Other specified disorders of kidney and ureter: Secondary | ICD-10-CM | POA: Insufficient documentation

## 2023-04-15 DIAGNOSIS — K649 Unspecified hemorrhoids: Secondary | ICD-10-CM | POA: Diagnosis not present

## 2023-04-15 DIAGNOSIS — C787 Secondary malignant neoplasm of liver and intrahepatic bile duct: Secondary | ICD-10-CM | POA: Insufficient documentation

## 2023-04-15 DIAGNOSIS — Z8546 Personal history of malignant neoplasm of prostate: Secondary | ICD-10-CM | POA: Insufficient documentation

## 2023-04-15 DIAGNOSIS — Z95828 Presence of other vascular implants and grafts: Secondary | ICD-10-CM

## 2023-04-15 MED ORDER — HEPARIN SOD (PORK) LOCK FLUSH 100 UNIT/ML IV SOLN
500.0000 [IU] | Freq: Once | INTRAVENOUS | Status: AC
  Administered 2023-04-15: 500 [IU] via INTRAVENOUS

## 2023-04-15 MED ORDER — SODIUM CHLORIDE 0.9% FLUSH
10.0000 mL | Freq: Once | INTRAVENOUS | Status: AC
  Administered 2023-04-15: 10 mL via INTRAVENOUS

## 2023-04-15 NOTE — Telephone Encounter (Signed)
-----   Message from Nurse Junius Roads sent at 04/15/2023 12:36 PM EDT ----- Regarding: Appointment [facility] is in hospice care now and his only complaint is of severe hemorrhoid pain. He saw the surgeon, who declines surgery. He has seen Dr. Tomasa Rand in past, but told us he does not "like" him. Possibly because he may have been the one that told him he had cancer. He is asking to see Dr. Leone Payor now. Is that possible? Do you need me to put in a new referral since it is for a provider change? Thank you, Kem Boroughs, RN [phone]

## 2023-04-15 NOTE — Telephone Encounter (Signed)
Please see note below and advise  

## 2023-04-15 NOTE — Patient Instructions (Signed)

## 2023-04-15 NOTE — Progress Notes (Signed)
[facility] OFFICE PROGRESS NOTE   Diagnosis: Rectal cancer  INTERVAL HISTORY:   [name] returns as scheduled.  He is accompanied by his wife.  She reports significant improvement in his overall wellbeing since beginning Wellbutrin about a month ago.  He has a good appetite.  No nausea or vomiting.  Bowels alternate constipation and diarrhea.  He has recently noticed enlargement of the left breast.  This was initially painful.  The pain had subsided.  Main complaint is hemorrhoid pain.  Objective:  Vital signs in last 24 hours:  Blood pressure (!) 158/86, pulse 82, temperature 98.1 F (36.7 C), temperature source Oral, resp. rate 18, height 5\' 9"  (1.753 m), weight 139 lb (63 kg).    Resp: Lungs clear bilaterally. Cardio: Regular rate and rhythm. GI: Abdomen soft and nontender.  No hepatosplenomegaly. Vascular: No leg edema. Breast: Left breast appears mildly enlarged, no mass, nontender. Port-A-Cath without erythema.  Lab Results:  Lab Results  Component Value Date   WBC 6.0 01/14/2023   HGB 14.3 01/14/2023   HCT 42.8 01/14/2023   MCV 84.9 01/14/2023   PLT 278 01/14/2023   NEUTROABS 4.7 01/14/2023    Imaging:  No results found.  Medications: I have reviewed the patient's current medications.  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; foundation 1-microsatellite stable, tumor mutation burden 5, K-rasG 12V, NRAS wildtype Colonoscopy 05/22/2021-ulcerated partially obstructing mass at 15 cm from anal verge CTs 05/30/2021-numerous small pulmonary nodules concerning for metastases, thickening of the rectum, multiple liver  metastases Cycle 1 FOLFOX 06/12/2021 Cycle 2 FOLFOX 06/26/2021 Cycle 3 FOLFOX 07/10/2021, oxaliplatin dose reduced due to progressive decline in the ANC and platelet count Cycle 4 FOLFOX 07/24/2021 Cycle 5 FOLFOX 08/07/2021, 5-FU bolus eliminated and oxaliplatin dose reduced, insurance would not approve Udenyca CTs 08/15/2021-decrease size of lung nodules, decreased hepatic metastases, persistent anorectal wall thickening, stable right renal mass Cycle 6 FOLFOX 08/21/2021, 5-FU bolus and 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 Cycle 7 FOLFOX 09/04/2021, 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 Cycle 8 FOLFOX 09/18/2021, 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 Cycle 9 FOLFOX 10/08/2021, 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 Cycle 10 FOLFOX 10/23/2021, 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 CTs 11/01/2021-decrease in size of occasional small pulmonary nodules, continued decrease in size of multiple hypoenhancing hepatic metastases, unchanged posttreatment appearance of the low rectum, unchanged exophytic mass of the inferior pole of the right kidney measuring 1.5 x 1.3 cm Cycle 11 5-fluorouracil 11/06/2021 Cycle 12 5-fluorouracil 11/20/2021 Cycle 13 5-fluorouracil 12/04/2021 Maintenance Xeloda beginning 12/19/2021 Xeloda dose reduced to 1000 mg twice daily beginning 12/27/2021 Xeloda placed on hold 01/16/2022 CTs 01/22/2022-segment of distal ileum with circumferential wall thickening and perienteric inflammation, decrease in size of liver metastases, stable tiny bilateral pulmonary nodules, circumferential wall thickening in the rectum, stable lower pole right kidney lesion suspicious for renal cell carcinoma Xeloda discontinued due to diarrhea 5-fluorouracil pump 02/05/2022 5-fluorouracil pump 02/26/2022 Treatment held 03/18/2022 due to diarrhea 5-fluorouracil pump 03/25/2022, dose reduced due to diarrhea 5-fluorouracil pump 04/16/2022 CTs  05/03/2022-unchanged tiny pulmonary nodules, unchanged liver metastases, stable appearance of the low rectal wall thickening, unchanged posterior right renal mass 5-fluorouracil pump 05/07/2022 5-fluorouracil pump 05/28/2022 5-fluorouracil pump 06/18/2022 CTs 07/04/2022-increased size of liver metastases, no new lesions, new  3 mm right lower lobe nodule-nonspecific Cycle 1 FOLFIRI/bevacizumab 07/16/2022 Cycle 2 FOLFIRI/bevacizumab 08/05/2022; 5-FU bolus eliminated, irinotecan dose reduced due to diarrhea and weight loss Cycle 3 FOLFIRI/bevacizumab 08/21/2022 Cycle 4 FOLFIRI/bevacizumab 09/03/2022 Cycle 5 FOLFIRI/bevacizumab 09/17/2022 CTs 09/26/2022-stable rectal thickening, stable hepatic metastases (dominant lesion appears smaller by my review), small indeterminate pulmonary nodules, unchanged exophytic enhancing lesion at the lower pole of the right kidney Cycle 6 FOLFIRI/bevacizumab 10/01/2022 Cycle 7 FOLFIRI/bevacizumab 10/15/2022, irinotecan dose reduced due to diarrhea Cycle 8 FOLFIRI/bevacizumab 11/05/2022 Cycle 9 FOLFIRI/bevacizumab 11/26/2022 Cycle 10 FOLFIRI/bevacizumab 12/17/2022 CTs 01/02/2023 increased size and several liver lesion stable rectal wall, stable tiny lung nodules Prostate cancer-simple prostatectomy January 2019, Gleason 3+3, 10 to 12% of specimen Active surveillance, biopsy May 2019 with small focus of Gleason 3+3 disease in 1/6 cores, surveillance continued Elevated PSA 04/20/2020 Biopsy 06/19/2020 8/12 cores positive, Gleason 4+4 PET scan 07/07/2020-negative CT 07/07/2020 right iliac and retrocaval adenopathy, 1.3 cm subcapsular right lower pole renal lesion Androgen deprivation therapy beginning 08/21/2020 Radiation to prostate, seminal vesicles, and pelvic lymph nodes to 10/22 - 12/28/2020 He continues every 51-month Firmagon and daily Xtandi 08/21/2022 patient reports Xtandi discontinued   3.  Right renal mass consistent with a renal cell carcinoma-stable on MRI abdomen  04/25/2021, stable on CT 08/15/2021 4.  Mitral valve prolapse 5.  Family history of prostate cancer 6.  08/15/2021 with leg weakness and an episode of slurred speech-TIA?,  Brain imaging negative 7.  Diarrhea and fatigue while on capecitabine April 2023-capecitabine discontinued 01/18/2022  Disposition: [name] appears stable.  He is enrolled in home hospice care.  Plan to continue supportive/comfort measures.  His main complaint today is hemorrhoid discomfort.  He has tried various treatments.  He would like to see Dr. Leone Payor.  Since beginning Wellbutrin he overall feels better but has noted recent enlargement of the left breast.  He understands this could be gynecomastia related to Wellbutrin.  He would like to continue Wellbutrin for now and continue to monitor.  He will return for a port flush and follow-up visit in 6 weeks.    Lonna Cobb ANP/GNP-BC   04/15/2023  11:05 AM

## 2023-04-16 NOTE — Telephone Encounter (Signed)
I have reviewed his chart.  Dr, Cliffton Asters had recommended a follow-up visit after Jan 24 evaluation but I do not see that it occurred.  I am doubtful that I could provide any significant relief based upon what I am seeing in the records.- I am willing to see him but my next available is not until September

## 2023-04-16 NOTE — Telephone Encounter (Signed)
Spoke with Kem Boroughs RN and made her aware of Dr. Leone Payor recommendations: Pt was scheduled for an office visit on 06/13/2023 at 9:10 with Dr. Leone Payor Left message for pt to call back

## 2023-04-17 NOTE — Telephone Encounter (Signed)
Left message for patient to call back to make him aware of appointment with Dr. Leone Payor.

## 2023-04-18 NOTE — Telephone Encounter (Signed)
Left message for patient to call back to make him aware of appointment with Dr. Leone Payor.

## 2023-04-21 NOTE — Telephone Encounter (Signed)
Left message for pt to call back  °

## 2023-04-22 NOTE — Telephone Encounter (Signed)
  Pt was scheduled for an office visit on 06/13/2023 at 9:10 with Dr. Leone Payor. Pt made aware  Pt verbalized understanding with all questions answered.

## 2023-05-27 ENCOUNTER — Inpatient Hospital Stay (HOSPITAL_BASED_OUTPATIENT_CLINIC_OR_DEPARTMENT_OTHER): Admitting: Oncology

## 2023-05-27 ENCOUNTER — Inpatient Hospital Stay: Attending: Nurse Practitioner

## 2023-05-27 VITALS — BP 142/89 | HR 60 | Temp 97.9°F | Resp 18 | Wt 137.8 lb

## 2023-05-27 DIAGNOSIS — R42 Dizziness and giddiness: Secondary | ICD-10-CM | POA: Insufficient documentation

## 2023-05-27 DIAGNOSIS — C61 Malignant neoplasm of prostate: Secondary | ICD-10-CM | POA: Diagnosis present

## 2023-05-27 DIAGNOSIS — Z85528 Personal history of other malignant neoplasm of kidney: Secondary | ICD-10-CM | POA: Insufficient documentation

## 2023-05-27 DIAGNOSIS — K5900 Constipation, unspecified: Secondary | ICD-10-CM | POA: Diagnosis not present

## 2023-05-27 DIAGNOSIS — I341 Nonrheumatic mitral (valve) prolapse: Secondary | ICD-10-CM | POA: Diagnosis not present

## 2023-05-27 DIAGNOSIS — C787 Secondary malignant neoplasm of liver and intrahepatic bile duct: Secondary | ICD-10-CM | POA: Insufficient documentation

## 2023-05-27 DIAGNOSIS — C20 Malignant neoplasm of rectum: Secondary | ICD-10-CM | POA: Insufficient documentation

## 2023-05-27 DIAGNOSIS — Z515 Encounter for palliative care: Secondary | ICD-10-CM | POA: Diagnosis not present

## 2023-05-27 DIAGNOSIS — Z8042 Family history of malignant neoplasm of prostate: Secondary | ICD-10-CM | POA: Diagnosis not present

## 2023-05-27 DIAGNOSIS — Z79899 Other long term (current) drug therapy: Secondary | ICD-10-CM | POA: Insufficient documentation

## 2023-05-27 DIAGNOSIS — R200 Anesthesia of skin: Secondary | ICD-10-CM | POA: Insufficient documentation

## 2023-05-27 DIAGNOSIS — N2889 Other specified disorders of kidney and ureter: Secondary | ICD-10-CM | POA: Insufficient documentation

## 2023-05-27 DIAGNOSIS — R197 Diarrhea, unspecified: Secondary | ICD-10-CM | POA: Diagnosis not present

## 2023-05-27 DIAGNOSIS — K649 Unspecified hemorrhoids: Secondary | ICD-10-CM | POA: Insufficient documentation

## 2023-05-27 MED ORDER — SODIUM CHLORIDE 0.9% FLUSH
10.0000 mL | Freq: Once | INTRAVENOUS | Status: AC
  Administered 2023-05-27: 10 mL via INTRAVENOUS

## 2023-05-27 MED ORDER — HEPARIN SOD (PORK) LOCK FLUSH 100 UNIT/ML IV SOLN
500.0000 [IU] | Freq: Once | INTRAVENOUS | Status: AC
  Administered 2023-05-27: 500 [IU] via INTRAVENOUS

## 2023-05-27 NOTE — Progress Notes (Signed)
[facility] OFFICE PROGRESS NOTE   Diagnosis: Rectal cancer  INTERVAL HISTORY:   Neil Perry returns as scheduled.  He is enrolled in home hospice care.  The hospice RN visits weekly.  He does not have significant pain.  Hemorrhoid discomfort is relieved with sitz bath's.  He has intermittent constipation and diarrhea.  He reports numbness in the right fourth and fifth fingers for the past month.  No neck pain.  He feels "dizzy "this morning.  He has noted enlargement of the left breast.  Objective:  Vital signs in last 24 hours:  Blood pressure (!) 142/89, pulse 60, temperature 97.9 F (36.6 C), temperature source Oral, resp. rate 18, weight 137 lb 12.8 oz (62.5 kg), SpO2 100%.    HEENT: No thrush Lymphatics: No cervical, supraclavicular, or axillary nodes Resp: Lungs clear bilaterally Cardio: Regular rate and rhythm with premature beats GI: Nontender, no hepatosplenomegaly Vascular: No leg edema, left lower leg is slightly larger than the right side Neuro: Hand and arm strength appear intact bilaterally Breast: There is left gynecomastia, no erythema or discrete mass  Portacath/PICC-without erythema  Lab Results:  Lab Results  Component Value Date   WBC 6.0 01/14/2023   HGB 14.3 01/14/2023   HCT 42.8 01/14/2023   MCV 84.9 01/14/2023   PLT 278 01/14/2023   NEUTROABS 4.7 01/14/2023    CMP  Lab Results  Component Value Date   NA 141 01/21/2023   K 3.9 01/21/2023   CL 107 01/21/2023   CO2 26 01/21/2023   GLUCOSE 114 (H) 01/21/2023   BUN 17 01/21/2023   CREATININE 0.88 01/21/2023   CALCIUM 9.3 01/21/2023   PROT 6.3 (L) 01/21/2023   ALBUMIN 3.9 01/21/2023   AST 12 (L) 01/21/2023   ALT 14 01/21/2023   ALKPHOS 74 01/21/2023   BILITOT 0.8 01/21/2023   GFRNONAA >60 01/21/2023    Lab Results  Component Value Date   CEA 128.29 (H) 01/07/2023   Medications: I have reviewed the patient's current medications.   Assessment/Plan:  Rectal  cancer MRI abdomen 04/26/2019 2-3 new hypervascular masses in the liver dome, no abdominal lymphadenopathy, stable benign left adrenal adenoma, stable right lower pole kidney mass Ultrasound-guided biopsy of a liver lesion 05/07/2021-metastatic adenocarcinoma with extensive necrosis, immunohistochemical profile consistent with a colorectal primary; foundation 1-microsatellite stable, tumor mutation burden 5, K-rasG 12V, NRAS wildtype Colonoscopy 05/22/2021-ulcerated partially obstructing mass at 15 cm from anal verge CTs 05/30/2021-numerous small pulmonary nodules concerning for metastases, thickening of the rectum, multiple liver metastases Cycle 1 FOLFOX 06/12/2021 Cycle 2 FOLFOX 06/26/2021 Cycle 3 FOLFOX 07/10/2021, oxaliplatin dose reduced due to progressive decline in the ANC and platelet count Cycle 4 FOLFOX 07/24/2021 Cycle 5 FOLFOX 08/07/2021, 5-FU bolus eliminated and oxaliplatin dose reduced, insurance would not approve Udenyca CTs 08/15/2021-decrease size of lung nodules, decreased hepatic metastases, persistent anorectal wall thickening, stable right renal mass Cycle 6 FOLFOX 08/21/2021, 5-FU bolus and 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 Cycle 7 FOLFOX 09/04/2021, 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 Cycle 8 FOLFOX 09/18/2021, 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 Cycle 9 FOLFOX 10/08/2021, 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 Cycle 10 FOLFOX 10/23/2021, 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 CTs 11/01/2021-decrease in size of occasional small pulmonary nodules, continued decrease in size of multiple hypoenhancing hepatic metastases, unchanged posttreatment appearance of the low rectum, unchanged exophytic mass of the inferior pole of the right kidney measuring 1.5 x 1.3 cm Cycle 11 5-fluorouracil 11/06/2021 Cycle 12 5-fluorouracil 11/20/2021 Cycle 13  5-fluorouracil 12/04/2021 Maintenance Xeloda beginning 12/19/2021 Xeloda dose  reduced to 1000 mg twice daily beginning 12/27/2021 Xeloda placed on hold 01/16/2022 CTs 01/22/2022-segment of distal ileum with circumferential wall thickening and perienteric inflammation, decrease in size of liver metastases, stable tiny bilateral pulmonary nodules, circumferential wall thickening in the rectum, stable lower pole right kidney lesion suspicious for renal cell carcinoma Xeloda discontinued due to diarrhea 5-fluorouracil pump 02/05/2022 5-fluorouracil pump 02/26/2022 Treatment held 03/18/2022 due to diarrhea 5-fluorouracil pump 03/25/2022, dose reduced due to diarrhea 5-fluorouracil pump 04/16/2022 CTs 05/03/2022-unchanged tiny pulmonary nodules, unchanged liver metastases, stable appearance of the low rectal wall thickening, unchanged posterior right renal mass 5-fluorouracil pump 05/07/2022 5-fluorouracil pump 05/28/2022 5-fluorouracil pump 06/18/2022 CTs 07/04/2022-increased size of liver metastases, no new lesions, new 3 mm right lower lobe nodule-nonspecific Cycle 1 FOLFIRI/bevacizumab 07/16/2022 Cycle 2 FOLFIRI/bevacizumab 08/05/2022; 5-FU bolus eliminated, irinotecan dose reduced due to diarrhea and weight loss Cycle 3 FOLFIRI/bevacizumab 08/21/2022 Cycle 4 FOLFIRI/bevacizumab 09/03/2022 Cycle 5 FOLFIRI/bevacizumab 09/17/2022 CTs 09/26/2022-stable rectal thickening, stable hepatic metastases (dominant lesion appears smaller by my review), small indeterminate pulmonary nodules, unchanged exophytic enhancing lesion at the lower pole of the right kidney Cycle 6 FOLFIRI/bevacizumab 10/01/2022 Cycle 7 FOLFIRI/bevacizumab 10/15/2022, irinotecan dose reduced due to diarrhea Cycle 8 FOLFIRI/bevacizumab 11/05/2022 Cycle 9 FOLFIRI/bevacizumab 11/26/2022 Cycle 10 FOLFIRI/bevacizumab 12/17/2022 CTs 01/02/2023 increased size and several liver lesion stable rectal wall, stable tiny lung nodules Prostate cancer-simple prostatectomy January 2019, Gleason 3+3, 10 to 12% of specimen Active surveillance, biopsy  May 2019 with small focus of Gleason 3+3 disease in 1/6 cores, surveillance continued Elevated PSA 04/20/2020 Biopsy 06/19/2020 8/12 cores positive, Gleason 4+4 PET scan 07/07/2020-negative CT 07/07/2020 right iliac and retrocaval adenopathy, 1.3 cm subcapsular right lower pole renal lesion Androgen deprivation therapy beginning 08/21/2020 Radiation to prostate, seminal vesicles, and pelvic lymph nodes to 10/22 - 12/28/2020 He continues every 6-month Firmagon and daily Xtandi 08/21/2022 patient reports Xtandi discontinued   3.  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  Mitral valve prolapse 5.  Family history of prostate cancer 6.  08/15/2021 with leg weakness and an episode of slurred speech-TIA?,  Brain imaging negative 7.  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  He appears stable.  He continues follow-up with the home hospice RN.  I suspect the left breast enlargement is related to benign gynecomastia.  He would like to continue follow-up with the cancer center.  He will return for an office visit and Port-A-Cath flush in 6 weeks.  He will call for new neurologic symptoms.  Thornton Papas, MD  05/27/2023  10:11 AM

## 2023-06-13 ENCOUNTER — Ambulatory Visit: Admitting: Internal Medicine

## 2023-07-15 ENCOUNTER — Inpatient Hospital Stay: Payer: No Typology Code available for payment source | Admitting: Oncology

## 2023-07-15 ENCOUNTER — Telehealth: Payer: Self-pay | Admitting: *Deleted

## 2023-07-15 ENCOUNTER — Inpatient Hospital Stay: Payer: No Typology Code available for payment source | Attending: Nurse Practitioner

## 2023-07-15 NOTE — Telephone Encounter (Signed)
Mr. Netti did not come to appointment today. Left VM for him to call to update on how he is doing and if he wants to reschedule. Called [facility] and confirmed that Mr. Truett died on 07-23-2023. MD notified.

## 2023-08-13 NOTE — Telephone Encounter (Signed)
Telephone call
